# Patient Record
Sex: Male | Born: 1950 | Race: Black or African American | Hispanic: No | Smoking: Former smoker
Health system: Southern US, Community
[De-identification: ages and names within clinical notes are randomized; demographics above are authoritative.]

## PROBLEM LIST (undated history)

## (undated) DIAGNOSIS — I712 Thoracic aortic aneurysm, without rupture: Secondary | ICD-10-CM

## (undated) DIAGNOSIS — T7840XA Allergy, unspecified, initial encounter: Secondary | ICD-10-CM

## (undated) DIAGNOSIS — B1920 Unspecified viral hepatitis C without hepatic coma: Secondary | ICD-10-CM

## (undated) DIAGNOSIS — Z8249 Family history of ischemic heart disease and other diseases of the circulatory system: Secondary | ICD-10-CM

## (undated) DIAGNOSIS — E291 Testicular hypofunction: Secondary | ICD-10-CM

## (undated) DIAGNOSIS — Z8669 Personal history of other diseases of the nervous system and sense organs: Secondary | ICD-10-CM

## (undated) DIAGNOSIS — K635 Polyp of colon: Secondary | ICD-10-CM

## (undated) DIAGNOSIS — I7121 Aneurysm of the ascending aorta, without rupture: Secondary | ICD-10-CM

## (undated) DIAGNOSIS — N400 Enlarged prostate without lower urinary tract symptoms: Secondary | ICD-10-CM

## (undated) DIAGNOSIS — I10 Essential (primary) hypertension: Secondary | ICD-10-CM

## (undated) DIAGNOSIS — I4891 Unspecified atrial fibrillation: Secondary | ICD-10-CM

## (undated) DIAGNOSIS — I059 Rheumatic mitral valve disease, unspecified: Secondary | ICD-10-CM

## (undated) HISTORY — PX: UPPER GASTROINTESTINAL ENDOSCOPY: SHX188

## (undated) HISTORY — PX: COLON SURGERY: SHX602

## (undated) HISTORY — PX: MITRAL VALVE REPLACEMENT: SHX147

## (undated) HISTORY — PX: COLONOSCOPY: SHX174

## (undated) HISTORY — DX: Allergy, unspecified, initial encounter: T78.40XA

---

## 1997-12-06 ENCOUNTER — Encounter: Admission: RE | Admit: 1997-12-06 | Discharge: 1997-12-06 | Payer: Self-pay | Admitting: *Deleted

## 2002-05-17 ENCOUNTER — Ambulatory Visit (HOSPITAL_COMMUNITY): Admission: RE | Admit: 2002-05-17 | Discharge: 2002-05-17 | Payer: Self-pay | Admitting: *Deleted

## 2002-05-17 ENCOUNTER — Encounter (INDEPENDENT_AMBULATORY_CARE_PROVIDER_SITE_OTHER): Payer: Self-pay | Admitting: Specialist

## 2002-05-26 ENCOUNTER — Inpatient Hospital Stay (HOSPITAL_COMMUNITY): Admission: EM | Admit: 2002-05-26 | Discharge: 2002-05-28 | Payer: Self-pay | Admitting: Emergency Medicine

## 2003-04-18 ENCOUNTER — Ambulatory Visit (HOSPITAL_COMMUNITY): Admission: RE | Admit: 2003-04-18 | Discharge: 2003-04-18 | Payer: Self-pay | Admitting: Interventional Cardiology

## 2003-10-09 ENCOUNTER — Ambulatory Visit (HOSPITAL_COMMUNITY): Admission: RE | Admit: 2003-10-09 | Discharge: 2003-10-09 | Payer: Self-pay | Admitting: Interventional Cardiology

## 2004-05-19 ENCOUNTER — Encounter: Admission: RE | Admit: 2004-05-19 | Discharge: 2004-05-19 | Payer: Self-pay | Admitting: Surgery

## 2004-12-10 ENCOUNTER — Observation Stay (HOSPITAL_COMMUNITY): Admission: AD | Admit: 2004-12-10 | Discharge: 2004-12-11 | Payer: Self-pay | Admitting: Internal Medicine

## 2004-12-11 ENCOUNTER — Ambulatory Visit: Payer: Self-pay | Admitting: Oncology

## 2004-12-21 ENCOUNTER — Ambulatory Visit (HOSPITAL_COMMUNITY): Admission: RE | Admit: 2004-12-21 | Discharge: 2004-12-21 | Payer: Self-pay | Admitting: Interventional Cardiology

## 2004-12-21 ENCOUNTER — Encounter (INDEPENDENT_AMBULATORY_CARE_PROVIDER_SITE_OTHER): Payer: Self-pay | Admitting: Interventional Cardiology

## 2005-01-01 ENCOUNTER — Ambulatory Visit (HOSPITAL_COMMUNITY): Admission: RE | Admit: 2005-01-01 | Discharge: 2005-01-01 | Payer: Self-pay | Admitting: Interventional Cardiology

## 2005-01-06 ENCOUNTER — Emergency Department (HOSPITAL_COMMUNITY): Admission: EM | Admit: 2005-01-06 | Discharge: 2005-01-06 | Payer: Self-pay | Admitting: Emergency Medicine

## 2005-01-22 ENCOUNTER — Inpatient Hospital Stay (HOSPITAL_COMMUNITY): Admission: EM | Admit: 2005-01-22 | Discharge: 2005-02-04 | Payer: Self-pay | Admitting: Emergency Medicine

## 2005-12-10 ENCOUNTER — Encounter: Admission: RE | Admit: 2005-12-10 | Discharge: 2005-12-10 | Payer: Self-pay | Admitting: Surgery

## 2008-01-05 ENCOUNTER — Encounter: Admission: RE | Admit: 2008-01-05 | Discharge: 2008-01-05 | Payer: Self-pay | Admitting: Surgery

## 2008-02-18 ENCOUNTER — Emergency Department (HOSPITAL_COMMUNITY): Admission: EM | Admit: 2008-02-18 | Discharge: 2008-02-18 | Payer: Self-pay | Admitting: Emergency Medicine

## 2009-09-08 ENCOUNTER — Ambulatory Visit (HOSPITAL_COMMUNITY): Admission: RE | Admit: 2009-09-08 | Discharge: 2009-09-08 | Payer: Self-pay | Admitting: Family Medicine

## 2010-01-20 ENCOUNTER — Ambulatory Visit: Payer: Self-pay | Admitting: Surgery

## 2010-01-20 ENCOUNTER — Encounter: Admission: RE | Admit: 2010-01-20 | Discharge: 2010-01-20 | Payer: Self-pay | Admitting: Surgery

## 2010-04-26 ENCOUNTER — Encounter: Payer: Self-pay | Admitting: Surgery

## 2010-05-06 ENCOUNTER — Ambulatory Visit (HOSPITAL_BASED_OUTPATIENT_CLINIC_OR_DEPARTMENT_OTHER)
Admission: RE | Admit: 2010-05-06 | Discharge: 2010-05-06 | Disposition: A | Discharge status: Home / Self Care | Payer: 59 | Attending: Urology | Admitting: Urology

## 2010-05-06 ENCOUNTER — Other Ambulatory Visit: Payer: Self-pay | Admitting: Urology

## 2010-05-06 DIAGNOSIS — Z7901 Long term (current) use of anticoagulants: Secondary | ICD-10-CM | POA: Insufficient documentation

## 2010-05-06 DIAGNOSIS — Z954 Presence of other heart-valve replacement: Secondary | ICD-10-CM | POA: Insufficient documentation

## 2010-05-06 DIAGNOSIS — R310 Gross hematuria: Secondary | ICD-10-CM | POA: Insufficient documentation

## 2010-05-06 DIAGNOSIS — F17200 Nicotine dependence, unspecified, uncomplicated: Secondary | ICD-10-CM | POA: Insufficient documentation

## 2010-05-06 DIAGNOSIS — K219 Gastro-esophageal reflux disease without esophagitis: Secondary | ICD-10-CM | POA: Insufficient documentation

## 2010-05-06 LAB — POCT I-STAT, CHEM 8
BUN: 17 mg/dL (ref 6–23)
Calcium, Ion: 1.21 mmol/L (ref 1.12–1.32)
Chloride: 109 mEq/L (ref 96–112)
Creatinine, Ser: 1.6 mg/dL — ABNORMAL HIGH (ref 0.4–1.5)
Glucose, Bld: 83 mg/dL (ref 70–99)
HCT: 31 % — ABNORMAL LOW (ref 39.0–52.0)
Hemoglobin: 10.5 g/dL — ABNORMAL LOW (ref 13.0–17.0)
Potassium: 3.9 mEq/L (ref 3.5–5.1)
Sodium: 144 mEq/L (ref 135–145)
TCO2: 26 mmol/L (ref 0–100)

## 2010-05-06 NOTE — Op Note (Signed)
NAMEJAMS, JUCKETT NO.:  [id number]  MEDICAL RECORD NO.:  [id number]          PATIENT TYPE:  AMB  LOCATION:  [facility]                         FACILITY:  [facility]  PHYSICIAN:  Lillette Boxer. Maritta Kief, M.D.DATE OF BIRTH:  07-18-1950  DATE OF PROCEDURE:  05/06/2010 DATE OF DISCHARGE:                              OPERATIVE REPORT   PREOPERATIVE DIAGNOSIS:  Gross hematuria.  POSTOPERATIVE DIAGNOSIS:  Gross hematuria.  SURGICAL PROCEDURES: 1. Cystoscopy. 2. Bilateral retrograde ureteral pyelograms. 3. Bilateral renal washings. 4. Right ureteroscopy.  SURGEON:  Lillette Boxer. Keshonna Valvo, M.D.  ANESTHESIA:  General with LMA.  COMPLICATIONS:  None.  SPECIMENS:  Renal washings bilaterally for cytology.  BRIEF HISTORY:  Mr. [name] as a nice 60 year old gentleman who I have been following for gross hematuria for some time.  The patient has had a normal CT scan.  He has had been negative cytologies and negative cystoscopy.  He still has persistent gross hematuria.  I have been unable to localize this to the right or left side.  At this point, due to persistent gross hematuria, he presents at this time for further diagnostic studies including cystoscopy, bilateral retrogrades, bilateral renal washings and possible ureteroscopy.  Risks and complications of the procedure have been discussed with the patient.  He understands these.  He desires to proceed.  DESCRIPTION OF PROCEDURE:  The patient was identified in the holding area and received preoperative IV Cipro.  He was taken to the operating room where general anesthetic was administered using LMA.  He was placed in the dorsal lithotomy position.  Genitalia and perineum were prepped and draped.  Time-out was then called.  The procedure then commenced.  A 22-French panendoscope was passed through his urethra.  Urethra was normal, prostate was nonobstructive. The bladder was entered and inspected circumferentially.  There  were no tumors, trabeculations or foreign bodies.  There was little bit of bleeding that was brought up by passing the scope through the prostatic urethra.  However, this was not felt to have been the cause of the patient's hematuria.  After visualization of both ureteral orifices, eventually I saw blood coming through the right ureteral orifice.  I then performed a retrograde ureteral pyelogram using a 6-French open- ended catheter.  This showed a normal ureter.  The pyelocaliceal systems were normal without evidence of pyelocaliectasis or filling defects.  I then advanced the open-ended catheter over the guidewire into the right renal pelvis and performed gentle washing with saline.  The washings were sent for cytology.  Washings did have some blood within it.  At this point, I then passed a guidewire up the open-ended catheter, removed the cystoscope and passed the inner core of 55 cm ureteral access sheath for ureteral dilatation.  The ureter was easily dilated. I then left the guidewire in place and then passed a flexible ureteroscope over top of the guidewire through the ureter and up into the right renal pelvis.  I then performed a systematic survey of the renal pelvis and the pyelocaliceal systems.  Several of the small calyces had punctate little telangiectatic vessels that looked like that had been bleeding.  I saw no specific lesions except for one small little 1 mm area that looked like it could be a small polyp.  This was in the lower pole calix.  There were no papillary lesions within the caliceal system or the within the renal pelvis.  I saw no stones.  There seem to be the telangiectatic vessels in 2 or 3 of the calyces, not specific to one.  These appeared to be more on the lower pole caliceal system than the upper pole.  I then removed the scope and visualized the ureter as the scope was withdrawn through the ureter.  No lesions were seen.  I then replaced the cystoscope  and performed a retrograde on the left side.  This revealed a normal ureter without any evidence of filling defects or widening.  The pyelocaliceal system was normal, without evidence of filling defects.  I then advanced the open-ended catheter up into the renal pelvis.  I then performed gentle washings with saline.  There was no blood in the washings from this side.  These were sent for cytology as well.  The catheter was then removed and the bladder drained and the cystoscope removed.  The patient tolerated the procedure well.  He was awakened and then taken to PACU in stable condition.  He will be discharged on 3 days of nitrofurantoin and Uribel.  I will follow him up on the May 22, 2010, at 2:30.     Lillette Boxer. Aven Christen, M.D.     SMD/MEDQ  D:  05/06/2010  T:  05/06/2010  Job:  [id number]  cc:   Belva Crome, M.D. Fax: Bur[place]on. Little, M.D. [phone]  Electronically Signed by Franchot Gallo M.D. on 05/06/2010 06:05:18 PM

## 2010-06-16 ENCOUNTER — Ambulatory Visit (HOSPITAL_COMMUNITY)
Admission: RE | Admit: 2010-06-16 | Discharge: 2010-06-16 | Disposition: A | Discharge status: Home / Self Care | Payer: 59 | Source: Ambulatory Visit | Attending: Cardiology | Admitting: Cardiology

## 2010-06-16 DIAGNOSIS — I079 Rheumatic tricuspid valve disease, unspecified: Secondary | ICD-10-CM | POA: Insufficient documentation

## 2010-06-16 DIAGNOSIS — I059 Rheumatic mitral valve disease, unspecified: Secondary | ICD-10-CM | POA: Insufficient documentation

## 2010-06-16 DIAGNOSIS — I359 Nonrheumatic aortic valve disorder, unspecified: Secondary | ICD-10-CM | POA: Insufficient documentation

## 2010-06-22 LAB — CROSSMATCH
ABO/RH(D): O POS
Antibody Screen: NEGATIVE

## 2010-06-22 LAB — ABO/RH: ABO/RH(D): O POS

## 2010-06-23 ENCOUNTER — Other Ambulatory Visit: Payer: Self-pay | Admitting: Surgery

## 2010-06-23 ENCOUNTER — Encounter (INDEPENDENT_AMBULATORY_CARE_PROVIDER_SITE_OTHER): Payer: 59 | Admitting: Surgery

## 2010-06-23 DIAGNOSIS — I059 Rheumatic mitral valve disease, unspecified: Secondary | ICD-10-CM

## 2010-06-23 DIAGNOSIS — I712 Thoracic aortic aneurysm, without rupture: Secondary | ICD-10-CM

## 2010-06-24 NOTE — Consult Note (Signed)
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  CLINICAL HISTORY:  I was asked by Dr. Tamala Julian to evaluate the patient for consideration of surgical treatment of the above problem.  He is a 60- year-old gentleman who is well-known to me from previous surgery.  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  This subsequently failed and was replaced with a St. Jude mechanical valve by Dr. Linus Mako in 1988.  The patient was referred to me in October 2006, after he developed fatigue and dark urine as well as anemia.  His workup was consistent with a hemolytic anemia and a TEE showed a periprosthetic leak adjacent to the sewing ring with moderate to severe mitral regurgitation through the periprosthetic leak.  The valve leaflets appeared to be doing well.  I operate on him on 01/28/2005, through a right thoracotomy approach with cardiopulmonary bypass via the right common femoral vein and right common femoral artery.  This was performed under hypothermic fibrillatory arrest.  The posterior periprosthetic leak was repaired with pledgeted horizontal mattress sutures since the remainder of the valve appeared to be well seated.  He did well with that surgery.  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  The maximum diameter has been about 5-cm at the level of the sinuses of Valsalva with the ascending aorta tapering down to about 3.5-cm in the proximal arch and 2.9-cm in the distal arch with the descending aorta about 2.7-cm.  When I last saw him on January 20, 2010, he told me that he had developed some weakness and anemia in June 2011, and underwent a GI  workup did not show any sign of blood loss.  He was started on iron with improvement in his blood count. He was seen several times by Dr. Franchot Gallo from Urology for workup of his dark urine, but no cause of hematuria could be found.  He says his urine usually starts out more clear in the morning and then as the day goes on, he gets darker.  At that time in October, he was feeling well without any chest pain or shortness of breath.  More recently, he presented back to Dr. Tamala Julian with about 6-week history of progressive dyspnea on exertion and lower extremity swelling.  His laboratory workup was consistent with homolysis with a low serum haptoglobin of less than 10 as well as a marked increase in his reticulocyte count of 8.5 and elevated total bilirubin of 2.3 with a indirect bilirubin of 1.8 and elevated direct bilirubin of 0.49.  He subsequently underwent a 2-D echocardiogram followed by a transesophageal echocardiogram which showed severe mitral regurgitation which was felt to be coming from a perivalvular leak.  His left atrium was severely dilated and the appendix was well visualized.  There is no PFO or atrial septal defect.  The aortic valve was trileaflet with mildly calcified leaflets and mild regurgitation.  Ejection fraction was 45%.  There is moderate tricuspid regurgitation.  Right ventricular size and function appeared normal.  REVIEW OF  SYSTEMS:  GENERAL:  He denies any fever or chills.  He does report significant fatigue over the past 1-2 months. EYES:  He did have some double vision and dizziness about 1 year ago and underwent a MRI which was reportedly negative.  He has had no recurrence. ENT:  Negative. ENDOCRINE:  He denies diabetes and hypothyroidism. CARDIOVASCULAR:  He denies any chest pain.  He has had shortness of breath with mild exertion worse over the past 6 weeks.  He denies PND and orthopnea.  He has had peripheral edema.  He denies  palpitations. RESPIRATORY:  He denies cough and sputum production. GI:  He has had no nausea or vomiting.  He denies melena and bright red blood per rectum. GU:  He does report dark urine.  He has been followed by Dr. Franchot Gallo.  He does report some hematuria.  MUSCULOSKELETAL:  He denies arthralgias and myalgias. NEUROLOGICAL:  He denies any focal weakness or numbness.  He denies dizziness and syncope.  He has never had a TIA or stroke. PSYCHIATRIC:  Negative. HEMATOLOGICAL:  He has a history of hemolytic anemia related to periprosthetic leak in the past.  MEDICATIONS: 1. Coumadin 5 mg daily. 2. Levitra p.r.n. 3. Iron 325 mg t.i.d. 4. Multivitamin daily. 5. Prilosec OTC 20 mg daily. 6. Trazodone 50 mg nightly p.r.n. 7. Benicar/HCT 20/12.5 daily.  ALLERGIES:  To penicillin.  PAST MEDICAL HISTORY:  Significant for rheumatic heart disease as mentioned above, status post mitral valve replacement as mentioned above with a porcine bioprosthesis in 1973 and redo mitral valve replacement with a St. Jude mechanical prosthesis in 1988.  He subsequently had repair of a large periprosthetic leak with hemolytic anemia and congestive heart failure in 2006.  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  History of atrial fibrillation and atrial flutter.  He has a history of BPH, followed by Dr. Diona Fanti.  He has a history of hemolytic anemia with hematuria.  He has a history of adenomatous colon polyp followed by Dr. Wilford Corner.  He has a history of erectile dysfunction.  SOCIAL HISTORY:  He is married and lives with his wife.  He has 2 children.  He has smoked occasionally in the past, but not presently. He denies alcohol and drug use.  He currently works for [employer] as an Chief Financial Officer.  FAMILY HISTORY:  Negative.  PHYSICAL EXAMINATION:  Vital Signs:  Blood pressure is 115/70, pulse 62 and regular, respiratory rate is 14 and unlabored.   Oxygen saturation on room air is 95%.  He is a thin [place] male, in no distress who does not look as robust as he did last time I saw him in October 2011. HEENT:  Normocephalic and atraumatic.  Pupils are equal and reactive to light and accommodation.  Extraocular muscles are intact.  His sclerae are slightly icteric.  Oropharynx is clear.  Neck:  Normal carotid pulses bilaterally.  There are no bruits.  There is no adenopathy or thyromegaly.  There is no JVD.  Cardiac:  Regular rate and rhythm with crisp mechanical valve click.  There is a 1/6 systolic murmur along the left sternal border that is most noticeable with inspiration.  There is no diastolic murmur.  Lungs:  Clear.  There is a old sternotomy scar. There is a right thoracotomy scar.  Abdomen:  Active bowel sounds.  His abdomen is soft and nontender.  There are no palpable masses or organomegaly.  Extremities:  Mild bilateral lower extremity edema to the knee.  Pedal pulses are palpable bilaterally.  Skin:  Warm and dry. Neurologic:  Alert and oriented x3.  Motor and sensory exams are grossly normal.  IMPRESSION:  The patient has severe mitral regurgitation with hemolytic anemia and congestive heart failure.  I think this is probably periprosthetic and posterior in the same genital area where I repaired his previous periprosthetic leak.  They may also be some regurgitation through the valve itself as possible that one of the leaflets is not closing completely due to pannus around the valve.  I think this will require redo mitral valve replacement.  He also has some degree of tricuspid regurgitation noted on TEE that was quantified has mild-to- moderate by Cardiology.  This would need be looked at closely because it may require tricuspid annuloplasty to prevent severe right heart failure symptoms.  He will require cardiac catheterization prior to surgical treatment to reassess his coronaries.  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  We will obtain a CT angiogram of the chest, abdomen and pelvis to evaluate this as well as the remainder of his aorta to date and decisions about cannulation.  My bias would be to leave his aortic root aneurysm alone at this time.  It has remained stable in size since he has trivial aortic insufficiency and repair of this problem, I would greatly complicate his redo mitral valve replacement and increase his operative morbidity and mortality greatly. I will need to think about the approach more thoroughly, and I will discuss his case with my partners to get their opinion.  I discussed all this with the patient and his wife in the office and answered all their questions.  I discussed the high-risk nature of this surgery and told them that we would discuss it further once we obtain a CT scan.  He is on Coumadin with a mechanical mitral valve and therefore in order to have cardiac catheterization, we will have to admit him for discontinuation of Coumadin and heparin bridge prior to catheterization and follow surgery shortly, thereafter while he is hospitalized on heparin.  Gilford Raid, M.D. Electronically Signed  BB/MEDQ  D:  06/23/2010  T:  06/24/2010  Job:  [id number]  cc:   Belva Crome, M.D. Priscille Heidelberg Little, M.D.

## 2010-06-30 ENCOUNTER — Encounter: Payer: 59 | Admitting: Surgery

## 2010-06-30 ENCOUNTER — Other Ambulatory Visit: Payer: 59

## 2010-07-03 ENCOUNTER — Other Ambulatory Visit: Payer: 59

## 2010-07-07 ENCOUNTER — Ambulatory Visit: Payer: 59 | Admitting: Surgery

## 2010-08-07 ENCOUNTER — Ambulatory Visit
Admission: RE | Admit: 2010-08-07 | Discharge: 2010-08-07 | Disposition: A | Discharge status: Home / Self Care | Payer: 59 | Source: Ambulatory Visit | Attending: Surgery | Admitting: Surgery

## 2010-08-07 DIAGNOSIS — I712 Thoracic aortic aneurysm, without rupture: Secondary | ICD-10-CM

## 2010-08-07 MED ORDER — IOHEXOL 300 MG/ML  SOLN
75.0000 mL | Freq: Once | INTRAMUSCULAR | Status: AC | PRN
  Administered 2010-08-07: 75 mL via INTRAVENOUS

## 2010-08-18 NOTE — Assessment & Plan Note (Signed)
OFFICE VISIT   [name], [name]  DOB:  10-Sep-1950                                        January 09, 2008  CHART #:  [id number]   The patient was supposed to return to see me today for followup of an  aortic root aneurysm.  He had an MR angiogram to follow this up, but my  office was cancelled today due to an emergency.  His previous history is  documented in the office chart.  I last saw him on December 14, 2005,  for followup of his aortic root aneurysm in a maximum diameter at that  time measured about 5 cm at the level of sinuses of Valsalva.  We  decided to follow this up in 2 years with an MR angiogram, which he had  done on January 05, 2008.  I reviewed the MR angiogram and is unchanged  from his prior study of 2007 with a maximum transverse diameter at the  level of the sinus of Valsalva 5 cm.  The distal ascending aorta taper  down to about 3.5 cm proximal to the arch and about 3 cm at the level of  the proximal arch.  The distal arch was 2.9 cm and the descending  thoracic aorta was 2.7 cm above the diaphragm.  There is no evidence of  dissection or fluid around the aorta.   I reviewed the results of the MR angiogram with the patient by telephone  at his request.  I told him I do not think there is any indication to do  surgery at this time, but then we should do a repeat MR angiogram in  about 2 years to follow up on this.  He has ongoing Cardiology follow up  with Dr. Belva Crome III for his previously placed mitral valve.  The  patient has no complaints at this time, said he was feeling well and  doing well overall.  He will continue to follow up with Dr. Tamala Julian and we  will arrange for him to have a repeat MR angiogram of the thoracic aorta  in 2 years.   Gilford Raid, M.D.  Electronically Signed   BB/MEDQ  D:  01/09/2008  T:  01/10/2008  Job:  [id number]   cc:   Belva Crome, M.D.

## 2010-08-18 NOTE — Assessment & Plan Note (Signed)
OFFICE VISIT   [name], [name]  DOB:  1950/09/15                                        January 20, 2010  CHART #:  [id number]   HISTORY:  The patient returns to my office today for followup of an  aortic root aneurysm.  I last saw him on January 09, 2008, at which time,  an MR angiogram showed that the maximum diameter of his aorta at the  level of his sinuses of Valsalva was 5 cm.  The distal ascending aorta  tapered down to about 3.5 cm proximal to the arch and about 3 cm at the  level of the proximal arch.  The distal arch was 2.9 cm and descending  thoracic aorta was 2.7 cm above the diaphragm.  He was doing well  otherwise and we elected to repeat his study in about 2 years.  He  reports that in the first part of this year, he developed weakness and  was found to be anemic.  He underwent blood transfusion.  He said that  he underwent a GI workup that did not show any blood loss, although he  denies having EGD or colonoscopy.  He said that he was started on iron  and has continued to improve his blood count.  He reports that in around  June of this year, he developed dark urine.  He was seen several times  by Dr. Franchot Gallo from Urology for workup.  He said that no cause  of hematuria could be found.  He said that he still has dark urine which  usually is worse in the evening.  He said his urine starts out more  clear in the morning and then as the day goes on, it gets darker.  He  does not think a hemolytic workup has been performed.  He denies any  chest pain, cough, or sputum production.  He has had no shortness of  breath.   PHYSICAL EXAMINATION:  His blood pressure is 127/74 and his pulse is 57  and regular.  Respiratory rate is 18 and unlabored.  Oxygen saturation  on room air is 99%.  He looks well.  Cardiac exam shows regular rate and  rhythm with crisp mechanical valve click.  There is no murmur, rub, or  gallop.  His lungs are clear.   There is no peripheral edema.   DIAGNOSTIC TESTS:  He had an MR angiogram of the chest done today at  [place].  The timing of the contrast bolus was suboptimal, so  the contrast was mostly in the right side of the heart during the scan,  but it is possible to see his aorta to measure the diameter.  The  diameter of the aortic root at the sinus level appears about the same as  before of 5 cm.  The ascending aorta tapers down as it did before.   IMPRESSION:  The patient has a stable 5-cm aortic root and ascending  aortic aneurysm, which is unchanged in size since 2006.  I would  recommend repeating his MR angiogram again in 2 years to follow up on  this.  He has no sign of aortic insufficiency on exam and has been  followed by Dr. Daneen Schick.  He is having dark urine again which is  somewhat concerning since this is  how he initially presented back in  2006, when he was found to have a perivalvular leak around a prosthetic  mitral valve, which has subsequently been repaired.  I do not hear a  murmur of mitral insufficiency, but it is possible he could have a small  periprosthetic leak that is not causing a murmur.  I will discuss this  with Dr. Tamala Julian and I think he probably should have a repeat  echocardiogram to rule this out if it has not been performed since he  started developing dark urine again.  The patient really is not very  clear about his workup that has been done since the beginning of this  year.   Gilford Raid, M.D.  Electronically Signed   BB/MEDQ  D:  01/20/2010  T:  01/21/2010  Job:  [id number]   cc:   Belva Crome, M.D.  Lillette Boxer. Dahlstedt, M.D.

## 2010-08-21 NOTE — H&P (Signed)
NAMEDHRUVA, FEARNLEY               ACCOUNT NO.:  [id number]   MEDICAL RECORD NO.:  [id number]          PATIENT TYPE:  INP   LOCATION:  5151                         FACILITY:  [facility]   PHYSICIAN:  Corinna L. Conley Canal, MDDATE OF BIRTH:  04/30/1950   DATE OF ADMISSION:  12/10/2004  DATE OF DISCHARGE:                                HISTORY & PHYSICAL   CHIEF COMPLAINT:  Anemia.   HISTORY OF PRESENT ILLNESS:  Mr. Menner is a 60 year old white male  patient of Dr. Maylon Peppers who was directly admitted today with symptomatic  anemia.  Patient denies any melena or hematochezia but he has been having  gross hematuria on and off which has been evaluated as an outpatient by Dr.  Diona Fanti.  Reportedly he had a CT of the abdomen and pelvis which was  unremarkable.  He reportedly had urine cytology and cystoscopy which were  reportedly normal.  Patient's hemoglobin on July 6 was 13 and has been about  9 most recently.  He has had about 9 Hemoccult cards brought to the office  which have all been negative.  Patient reports that he is more dyspneic on  exertion.  He denies any chest pain, any wheezing.  He has lost 10 pounds  over the past two weeks.  His appetite has been poor.  Dr. Maylon Peppers  discussed the case with Dr. Diona Fanti who felt that a drop in hemoglobin of  this level would probably not be explained by the hematuria and that  something else may be going on as well.  Dr. Maylon Peppers discussed the case  with Dr. Alen Blew of hematology who recommended admission for work-up and  possible transfusion.  Dr. Alen Blew reported willingness to consult in the  hospital.   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   MEDICATIONS:  1.  Coumadin 5 mg a day.  2.  Proscar 5 mg a day.  3.  Iron.   SOCIAL HISTORY:  Patient does smoke.  He does not drink or use drugs.  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  He is here with his wife.   FAMILY HISTORY:   His mother died of stomach cancer.   REVIEW OF SYSTEMS:  As above, otherwise negative.   PHYSICAL EXAMINATION:  VITAL SIGNS:  Temperature 98.8, pulse 74, respiratory  rate 18, blood pressure 118/68, 98% saturation on room air.  GENERAL:  Patient is a thin black male who appears younger than his stated  age.  HEENT:  Slightly pale conjunctivae.  Sclerae non-icteric.  Normocephalic,  atraumatic.  Pupils are equal, round, and reactive to light.  Moist mucous  membranes.  NECK:  Supple.  No lymphadenopathy.  LUNGS:  Clear to auscultation bilaterally without wheezes, rhonchi, or  rales.  CARDIOVASCULAR:  Regular rate and rhythm with mechanical click and systolic  murmur.  ABDOMEN:  Soft, nontender, nondistended.  RECTAL:  Deferred as multiple Hemoccults in the office have been negative.  EXTREMITIES:  No clubbing, cyanosis, edema.  SKIN:  No rash.  PSYCHIATRIC:  Normal affect.  NEUROLOGIC:  Alert and oriented.  Cranial nerves and sensory motor  examination are intact.   LABORATORIES:  Creatinine on September 6 in the office was 1.5.  Total  bilirubin was 1.6, AST 130.  Otherwise, his complete metabolic panel was  normal.  His hemoglobin was 9.2, hematocrit 26, MCV was 98, platelet count  was 135.  His B12 was 504.  His ferritin was 107, iron was 100, TIBC was  266, transferrin was 190.  His hemoglobin was 13.5 on October 31, 2004.  Laboratory work today:  ABG is normal on room air.  White blood cell count  4.6, hemoglobin 8.4, hematocrit 24.1, MCV is 97, RDW is 16.9.  His  differential is normal except for a slightly elevated eosinophil percentage  at 7.  Reticulocyte count is high at 6.6.  Absolute reticulocyte count is  162.  INR is 2.5, PTT is 36.  Complete metabolic panel is significant for a  total bilirubin of 1.8, AST of 167, ALT of 68, otherwise unremarkable.  His  LDH is elevated at 1905.  B-type natriuretic peptide is 165.   ASSESSMENT/PLAN:  1.  Normocytic anemia:  Repeat B12  is pending.  Folate is pending.      Haptoglobin is pending as are iron indices.  His anemia can be somewhat      explained by his hematuria but his LDH is elevated which may be      indicative of hemolysis.  2.  Hematuria work-up has been negative.  I have no records from Dr.      Diona Fanti and it may be helpful to get the CAT scan report as well as op      note from him in the morning.  I will check a urinalysis at this time.  3.  Mitral valve replacement on chronic Coumadin.   Patient will get transfused 2 units of packed red blood cells as he is  symptomatic with this hemoglobin level.  Consider consulting Dr. Alen Blew in  the morning.  Patient, however, at this time is being placed on 23-hour  observation.      Corinna L. Conley Canal, MD  Electronically Signed     CLS/MEDQ  D:  12/10/2004  T:  12/11/2004  Job:  [id number]   cc:   Candace Gallus, M.D.  Fax: [phone]   Lillette Boxer. Dahlstedt, M.D.  Fax: [phone]   Mathis Dad. [employer]

## 2010-08-21 NOTE — H&P (Signed)
NAME:  [name], [name] NO.:  [id number]   MEDICAL RECORD NO.:  [id number]                   PATIENT TYPE:  INP   LOCATION:  1823                                 FACILITY:  [facility]   PHYSICIAN:  Raelyn Ensign. Vladimir Faster, M.D.            DATE OF BIRTH:  December 03, 1950   DATE OF ADMISSION:  05/26/2002  DATE OF DISCHARGE:                                HISTORY & PHYSICAL   HISTORY OF PRESENT ILLNESS:  The patient is a 60 year old male originally  referred to me by Dr. Maylon Peppers for consideration of colon cancer screening.  He denied any family history of colorectal neoplasia but is turning 3.  He  has a St. Jude mitral valve and is on chronic Coumadin therapy.  This was  held for a colonoscopy that was performed on February 12th.  At colonoscopy,  two small adenomatous polyps were removed that apparently fragmented as they  were retrieved since the pathology report mentions five small pieces of  polyp; however, only two were removed colonoscopically one at 50 cm and one  at 40 cm.  The patient tolerated the procedure well and was well for the  next eight days.  His Coumadin was restarted.  He had his prothrombin time  checked this week and said it was fine.  He called me yesterday and said he  had had two reddish bowel movements.  He had no abdominal pain and was not  feeling lightheaded or dizzy or weak.  However, today he had six additional  bloody stools and began to feel somewhat weak.  He came to the emergency  room where his hemoglobin was checked and was 10.2.  His platelet count was  127.  Prothrombin time 30.5 for an INR of 3.7.  He is admitted for  observation, transfusion if necessary, and slow correction of his  prothrombin time as needed.  I discussed the case on the phone with Dr.  Peter Martinique and we elected to give the patient 2.5 mg of vitamin K orally.  He has no abdominal pain.   CURRENT MEDICATIONS:  1. Coumadin 5 mg daily.  2. Lanoxin 0.5 mg  daily.   ALLERGIES:  PENICILLIN.   PAST MEDICAL HISTORY:  Pertinent for a mitral valve replacement in 1988.   FAMILY HISTORY:  Negative for ulcers, gallstones, inflammatory bowel disease  or colorectal neoplasia.   SOCIAL HISTORY:  He is married.  Does not smoke cigarettes or drink alcohol.  He has an occasional cigar.   REVIEW OF SYSTEMS:  GENERAL:  No weight loss or night sweats.  ENDOCRINE:  No history of diabetes or thyroid problems.  SKIN:  No rash or pruritus.  EYES:  No icterus or change in vision.  ENT:  No aphthous ulcers or chronic  sore throat.  RESPIRATORY:  No shortness of breath, cough, or wheezing.  CARDIAC:  Palpitations and history of mitral valve replacement.  GI:  As  above.  GU:  No dysuria or hematuria.  Remainder of review of systems is  negative.   PHYSICAL EXAMINATION:  GENERAL:  He is a well-developed, well-nourished  adult male in no acute distress.  VITAL SIGNS:  Afebrile, blood pressure 110/70, pulse 67 and regular.  SKIN:  Normal.  HEENT:  Eyes are anicteric, conjunctivae are pink.  Oropharynx is  unremarkable without aphthous ulcerations.  NECK:  Supple without cervical or inguinal adenopathy.  CHEST:  Clear to auscultation and percussion.  HEART:  Has clicking valve sounds and midline sternotomy scar is seen.  ABDOMEN:  Benign without mass, tenderness, organomegaly.  There is no  rebound or hernia.  Bowel sounds are normal.  RECTAL:  Not performed.  EXTREMITIES:  Without clubbing, cyanosis, edema, or rash.   IMPRESSION:  Postpolypectomy bleed in a 60 year old male on Coumadin.  He is  currently over anticoagulated for unclear reasons.   PLAN:  Prothrombin time will be checked tomorrow.  He will be observed in  hospital with normal saline IV running at 75 mL per hour.  Hemoglobin will  be checked every 6 hours and if he drops below 9 we are going to give him a  unit of blood.  If he persists in bleeding, I will repeat his sigmoidoscopy  and  either cauterize or inject the bleeding sites for control of hemorrhage.  Please see the orders.                                               Raelyn Ensign. Vladimir Faster, M.D.    PJS/MEDQ  D:  05/26/2002  T:  05/26/2002  Job:  [id number]   cc:   Sinclair Grooms, M.D.  [place]. [employer]  [address] [place] [address]  Fax: [phone]   Candace Gallus, M.D.  [place]. [place] [address]  Fax: [phone]

## 2010-08-21 NOTE — Consult Note (Signed)
[name], SICOTTE NO.:  [id number]   MEDICAL RECORD NO.:  [id number]          PATIENT TYPE:  INP   LOCATION:  5151                         FACILITY:  [facility]   PHYSICIAN:  Mathis Dad. Shadad        DATE OF BIRTH:  11-Jul-1950   DATE OF CONSULTATION:  DATE OF DISCHARGE:                                   CONSULTATION   CONSULTING PHYSICIAN:  Benito Mccreedy, M.D.   REASON FOR CONSULTATION:  Anemia.   HISTORY OF PRESENT ILLNESS:  This is a 60 year old African-American  gentleman with past medical history significant for St.  Jude's mitral valve  replacement in 1988 for rheumatic fever.  He has been chronically  anticoagulated under the care of cardiology, Dr. Tamala Julian.  He has been  complaining of hematuria for the last few months.  He was noted to be anemic  as well.  The patient had an extensive workup by Dr. Diona Fanti from urology.  His workup included a CT scan of the abdomen and pelvis which was  essentially unremarkable.  The patient has also had urine cytology and  cystoscopy which was essentially normal.  The patient's hemoglobin on October 08, 2004 was 13 and has been as low as 9 before.  On October 29, 2004, it was  13.5.  In August 2006, it was 9.1.  Most recently on December 08, 2004, his  hemoglobin is 9.2, his bilirubin is 1.6, his creatinine is 1.3.  He has also  had slight elevation in his LFTs including AST of 130, an ALT was normal at  62.  He had normal iron studies and B12 and folate on November 19, 2004.  Due  to symptomatic anemia, the patient was admitted for this workup.   The patient reports he does have gross hematuria and some exertional  dyspnea, but he does not report any cough, hemoptysis or hematemesis.  Did  not report any rectal bleeding.  Does not report any petechiae.  Did not  report any easy bruisability.  Did not report any headaches, blurred vision,  double vision or neurological changes.   PAST MEDICAL HISTORY:  As mentioned mitral  valve replacement in 1988.  He  has also had adenomatous polyps in 2004.  There is also history of erectile  dysfunction.   MEDICATIONS:  He is on Coumadin, Proscar and iron replacement.   FAMILY HISTORY:  His mother had gastric cancer.  No other blood disorders.   SOCIAL HISTORY:  He does occasional smoking.  He does not drink or do any  drugs.  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   REVIEW OF SYSTEMS:  He does not have any fever, chills, night sweats, weight  loss, appetite changes.  He did not have any chest pain, shortness of  breath, cough, hemoptysis, or hematemesis.  He does have external dyspnea  when his hemoglobin is low.  He does not have any nausea or vomiting.  He  does not have any neurological complaints.  He denies any bleeding or  clotting tendency.  He  does have hematuria as mentioned above.   PHYSICAL EXAMINATION:  An alert and oriented gentleman, appeared well.  He  was not in any physical distress.  His temperature is 98.  Pulse is 80.  Respirations 16.  His blood pressure is 118/60.  He is saturating 98% on  room air.  Head is normocephalic, atraumatic.  Pupils equal, round and  reactive to light.  Mucous membranes are moist and pink.  Neck is supple, no  lymphadenopathy.  Heart is regular rate and rhythm.  Lungs clear to  auscultation, no rales, wheezes, dullness to percussion.  Abdomen is soft,  nontender.  Extremities with no clubbing, cyanosis or edema.  Neurologically  intact.  Examination of his chest did show that he has an audible __________   Blood work on this admission showed a hemoglobin of 8.4, white cells of 4.6,  and platelet count of 165.  The patient did receive two units of transfusion  which bumped him up to hematocrit of 28.5.  His studies show that he had a  retic percentage of 6.6.  His INR is 2.5, potassium of 4.5.  His creatinine  is 1.5, bilirubin 1.8.  His AST is 167, ALT 68.  His LDH is 1905.  His iron   saturation is 34% and ferritin of 92.  His haptoglobin is less than 6.  His  urinalysis showed brown with large bilirubin, a lot of protein and large  amounts of blood, positive for nitrites and positive for leukocytes.  His  Coombs' test was positive by IgG and by complement.   Examination of his peripheral smear showed a few schistocytes, about 2 to 3  in high-power field.  There was no evidence of spherocytosis.  There was no  evidence of dysplastic-looking cells.   ASSESSMENT AND PLAN:  This is a 60 year old gentleman with worsening anemia  for the last two months.  His indices report a normocytic normochromic;  however, he had an elevated LDH, bilirubin, retic count and low haptoglobin  indicating an hemolytic process.  He has a negative Coombs' test which  indicates the possibility of a nonimmune hemolytic process with the  differential diagnosis including some medications but mostly mechanical  destruction.  The most likely culprit in his case would be the mitral valve.  A perivalvular leak is a pretty common cause for a nonimmune hemolytic  anemia, although aortic valves are most common to cause that than mitral  valves.   So my recommendation at this point would be to get an echocardiogram to  evaluate the valve as well as the perivalvular structures as well as  evaluate his aneurysmal dilatation with that.  Other causes of  microangiopathic hemolytic anemia such as TTP are less likely given the lack  of constellation of symptoms and the fact that his platelets are normal.  Have set up the patient to follow up with me in the next week or so to check  his CBC to see if he needs a transfusion.           ______________________________  Mathis Dad [facility]  Electronically Signed     FNS/MEDQ  D:  12/11/2004  T:  12/11/2004  Job:  [id number]   cc:   Candace Gallus, M.D.  [place]. [place] [address]  Fax: [phone]

## 2010-08-21 NOTE — Discharge Summary (Signed)
[name], [name] NO.:  [id number]   MEDICAL RECORD NO.:  [id number]          PATIENT TYPE:  INP   LOCATION:  2012                         FACILITY:  [facility]   PHYSICIAN:  [name], M.D.     DATE OF BIRTH:  1950-05-28   DATE OF ADMISSION:  01/22/2005  DATE OF DISCHARGE:  02/03/2005                                 DISCHARGE SUMMARY   PRIMARY ADMITTING DIAGNOSES:  1.  Hemolytic anemia.  2.  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   ADDITIONAL/DISCHARGE DIAGNOSES:  1.  Hemolytic anemia.  2.  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  3.  Mild postoperative renal insufficiency.  4.  Postoperative volume overloaded.  5.  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  6.  Benign prostatic hypertrophy.  7.  History of erectile dysfunction.  8.  History of adenomatous polyps.   PROCEDURES PERFORMED:  1.  Right thoracotomy.  2.  Repair of right periprosthetic mitral valve leak with cardiopulmonary      bypass via the right common femoral vein and right common femoral      artery.   HISTORY:  The patient is a 60 year old male who is well-known to [facility].  He  previously underwent a mitral valve replacement in the 1970 and subsequently  a redo mitral valve replacement with a St. Jude valve in 1988 by Dr. Victory [name].  He has been followed by Dr. 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  Over the past several  months, he has developed increasing fatigue, hematuria and anemia which was  consistent with hemolysis.  A transesophageal echocardiogram was performed  and showed a periprosthetic leak with moderate-to-severe mitral valve  regurgitation.  He recently was seen by his hematologist and was found to  have a hemoglobin of 6.5 and hematocrit of 8.1.  Because of these findings,  he was felt to require admission for transfusion and cardiology and  cardiothoracic surgery consultations for possible redo mitral valve surgery.  Please see dictated history and physical for further details.   HOSPITAL COURSE:  The patient was admitted on January 22, 2005 and was  transfused 2 units of packed red blood cells.  He was seen by Dr. Daneen [name] and was started on IV heparin and his Coumadin was discontinued in  preparation for cardiac catheterization.  This was performed on January 26, 2005 and he was found to have no significant coronary artery disease and no  aortic valve disease.  He was found to have good mitral valve function with  mitral regurgitation as previously noted.  He was seen by Dr. Gilford [name]  for consideration of surgery.  Dr. Cyndia [name] explained the risks, benefits and  alternatives of the procedure with the patient and he agreed to surgery.  He  remained stable and was taken to the operating room on January 28, 2005.  He  underwent a right thoracotomy with mitral valve repair.  His paravalvular  leak was repaired with pledgeted sutures.  Intraoperatively, a large area of  dehiscence was noted along the posterior annulus, which was repaired without  difficulty.  He did require cardiopulmonary bypass through the femoral vein  and femoral artery.  He tolerated the procedure well and was transferred to  the SICU in stable condition.  He was able to be extubated shortly after  surgery.  He was hemodynamically stable and doing well on postop day #1.  His chest tubes and hemodynamic monitoring devices were removed in the usual  fashion.  He was restarted on Coumadin and was able to be transferred to the  floor on postop day #1.  His anemia has been stable postoperatively and he  has been started on an iron supplement.  He has not required further  transfusions since his initial admission.  His main postoperative problem  has been significant volume overload.  He has been aggressively diuresed and  is making good progress.  He is  still above his preoperative weight and has  lower extremity and scrotal edema on physical exam, but this is improving.  His anticoagulation has been ongoing and at the present time, his PT is 20.6  with an INR of 1.8.  He is ambulating in the halls without difficulty.  He  has remained afebrile and all vital signs have been stable.  He also had  mild postoperative renal insufficiency with creatinine bumping up to 1.6.  This was also remained stable and has trended back down.  His most recent  labs show a hemoglobin of 8.6, hematocrit 25.5, white count 6.0, platelets  177,000; sodium 136, potassium 4.1, BUN 11, creatinine 1.5.  He is scheduled  to have a repeat CBC and BMET on the morning of February 03, 2005 in addition  to PT and INR to check his Coumadin.  It is anticipated that if he is  therapeutic with an INR greater than 2.0 and no other acute changes have  occurred, he will hopefully be ready for discharge home on February 03, 2005.   DISCHARGE MEDICATIONS:  1.  Coumadin -- home dose will be determined by PT and INR drawn on the date      of discharge.  2.  Lasix 40 mg daily x1 week.  3.  K-Dur 20 mEq daily x1 week.  4.  Toprol-XL 25 mg daily.  5.  Nu-Iron 150 mg daily.  6.  Proscar 5 mg daily.  7.  Tylox one to two q.4 h. p.r.n. for pain.   DISCHARGE INSTRUCTIONS:  He is asked to refrain from driving, heavy lifting  or strenuous activity.  He may continue ambulating daily and using his  incentive spirometer.  He may shower daily and clean his incisions with soap  and water.  He will continue a low-fat, low-sodium diet.   DISCHARGE FOLLOWUP:  He will have a PT and INR drawn on Friday, February 05, 2005, for monitoring of his Coumadin.  He will call for an appointment to  see Dr. Tamala [name] in 2 weeks.  He will have a chest x-ray at that visit.  He  will then follow up with Dr. Cyndia [name] in the [place] office on February 23, 2005 and should bring his chest x-ray for Dr. Cyndia [name] to review.   If he  experiences any problems or has questions in the interim, he is asked to  contact our office immediately.      Suzzanne Cloud, P.A.      [name], M.D.  Electronically Signed  GC/MEDQ  D:  02/02/2005  T:  02/02/2005  Job:  [id number]   cc:   Belva Crome, M.D.  Fax: [phone]   Candace Gallus, M.D.  Fax: [place]. [employer]

## 2010-08-21 NOTE — H&P (Signed)
[name], [name]               ACCOUNT NO.:  [id number]   MEDICAL RECORD NO.:  [id number]          PATIENT TYPE:  INP   LOCATION:  5151                         FACILITY:  [facility]   PHYSICIAN:  Corinna L. Conley Canal, MDDATE OF BIRTH:  1950/06/07   DATE OF ADMISSION:  12/10/2004  DATE OF DISCHARGE:                                HISTORY & PHYSICAL   Audio too short to transcribe (less than 5 seconds)      Corinna L. Conley Canal, MD     CLS/MEDQ  D:  12/10/2004  T:  12/10/2004  Job:  [id number]

## 2010-08-21 NOTE — Op Note (Signed)
[name], [name] NO.:  [id number]   MEDICAL RECORD NO.:  [id number]          PATIENT TYPE:  INP   LOCATION:  2314                         FACILITY:  [facility]   PHYSICIAN:  Gilford Raid, M.D.     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   OPERATIVE PROCEDURE:  Right thoracotomy, cardiopulmonary bypass via the  right common femoral vein and right common femoral artery, repair of  periprosthetic mitral valve leak with pledgeted sutures.   SURGEON:  Gilford Raid, M.D.   ASSISTANT:  Lanelle Bal, MD.   SECOND ASSISTANT:  Suzzanne Cloud, P.A.-C.   ANESTHESIA:  General endotracheal anesthesia.   CLINICAL HISTORY:  This patient is a 60 year old gentleman who underwent  initial mitral valve replacement in the 1970s with a tissue valve.  He  subsequently had redo mitral valve replacement with a St. Jude valve in 1988  by Dr. Linus Mako.  He did well until this past summer.  His wife and son  say that he developed fatigue over the summer and in July, he developed dark  urine suddenly and was found to be anemic.  Hematological workup was  consistent with hemolytic anemia.  A transesophageal echocardiogram was  performed to examine his prosthetic St. Jude mitral valve.  There was a  periprosthetic leak adjacent to the sewing ring with moderate to severe  mitral valve regurgitation through the periprosthetic leak.  The valve  leaflets appeared to be moving well.  Left ventricular function was well  preserved.  He continued to require transfusion every few weeks and,  therefore, it was felt that mitral valve surgery would be necessary.  I have  been following the patient for the past  several years with a 4.8 cm  ascending aortic aneurysm which has been stable.  He has no aortic  insufficiency.  He had blood cultures drawn which were negative.  His white  blood cell count was normal.  He had no fever or chills and no symptoms of  infection.  He was brought into the hospital and started on heparin and his  Coumadin was stopped.  He underwent cardiac catheterization earlier this  week which showed normal coronary arteries.  Plans were made for repair of  the periprosthetic leak.  I discussed the operative procedure with the him  including use of a right thoracotomy approach and repair of his mitral valve  leak or replacement of his mitral valve using hypothermic fibrillatory  arrest.  I discussed alternatives, benefits, and risks, including bleeding,  blood transfusion, infection, stroke, myocardial infarction, recurrence of  the perivalvular leak requiring further surgery, and death.  He understood  all this and agreed to proceed.   OPERATIVE PROCEDURE:  The patient was taken to the operating room and placed  on the table in supine position.  After induction  of general endotracheal  anesthesia using a double lumen tube, a Foley catheter was placed in the  bladder.  Then, transesophageal echocardiogram was performed by Dr. Orene Desanctis  and will be dictated separately.  This showed an area of perivalvular leak  along the posterior mitral annulus with severe regurgitation through this  leak.  The valve leaflets appeared to be moving normally.  Left ventricular  function was well preserved.  The patient had moderate pulmonary  hypertension when the pulmonary artery catheter was inserted.  There was no  aortic insufficiency.  There was no evidence of an atrial septal defect or  PFO.   Then the patient was positioned with the right side of the chest tilted up  about 30 degrees.  Then, the chest, abdomen, and both lower extremities were  prepped and draped in the usual sterile  fashion.  The right chest was  entered through an anterolateral thoracotomy incision at about the fifth  intercostal space.  There were adhesions between the lung and the chest wall  and these were divided using electrocautery.  Then, a vertical incision was  made in the right groin and the right common femoral artery and vein were  identified and controlled proximally and distally with vessel loops.  There  was no disease in the artery.  Then, the patient was heparinized and when an  adequate activated clotting time was achieved, the right common femoral  artery was cannulated using a 20 French arterial cannula for in flow.  Outflow was achieved using a Medtronic 25 [place] long triple staged staged  venous cannula.  A guide-wire was first passed into the right common femoral  vein and advanced up into the right atrium and then dilators were inserted  followed by the cannula.  It was placed under direct vision with  transesophageal echocardiogram and the tip was advanced up into the superior  vena cava.  The patient was then placed on cardiopulmonary bypass at  normothermia.  There was excellent flow.  The pump assisted venous return  was used.   Then, the remainder of the lung was dissected off the pericardium. The  phrenic nerve was identified and carefully preserved.  Then, an incision was  made vertically along the pericardium about 1 cm anterior to the phrenic  nerve.  The pericardium was dissected off the intra-atrial groove.  The  previous suture line was identified.  Then, the patient was cooled to 20  degrees Centigrade.  When the heart began to fibrillate, a fibrillator patch  was placed on the right ventricle and was activated.  Then, the left atrium  was opened in the intra-atrial groove.  Examination of the prosthetic mitral  valve showed that there was a large area of dehiscence of the sewing ring from the posterior mitral annulus.  The tissues appeared fairly strong in   this area.  There did not appear to be any signs of infection or abscess.  There were no vegetations on the valve.  The valve leaflets were moving  normally.  The anterior portion of the annulus appeared firmly adherent to  the sewing ring.  I felt that the best treatment at this point would be to  try to reapproximate the left atrial wall and annulus to the sewing ring.  This was accomplished with several 3-0 Prolene pledgeted horizontal mattress  sutures.  After these were placed, the left ventricle was allowed to fill  with blood and there was no visible leakage around the annulus.  The patient  was then rewarmed to 37 degrees centigrade.  The left atriotomy incision was  closed in two layers using continuous 3-0 Prolene suture.  The left side of  the heart was deaired as completely as possible.  After closure of the  atriotomy incision, the patient was defibrillated into sinus rhythm.  Then,  as further rewarming was performed, the heart was filled with blood and the  mitral valve appeared to be functioning normally.  The perivalvular region  was examined closely and there was no evidence of residual perivalvular  leak.  Left ventricular function appeared well preserved.  The aortic valve  had no stenosis or regurgitation.  Then, when the patient had been rewarmed  to 37 degrees Centigrade, he was weaned from cardiopulmonary bypass on low  dose Dopamine.  Total bypass time was 123 minutes.  Cardiac function  appeared excellent with a cardiac output of 7 liters per minute.  Protamine  was given.  The femoral venous and arterial cannulae were removed and the  vessels repaired with continuous 6-0 Prolene suture.   The patient was given 10 units of platelets due to thrombocytopenia with  platelet count of 60,000.  Hemostasis was achieved.  Then, an On-Q pain pump  was inserted.  The 10 cm sheath was inserted between the ribs and advanced  beneath the pleura in the posterior costovertebral  sulcus.  Through this  sheath, the infusion catheter was inserted and the sheath removed.  The  catheter was fixed to the skin with a silk suture.  The catheter was then  connected to the pump.  Then, two chest tubes were placed in the posterior  pleural space and one in the anterior pleural space.  The ribs were  reapproximated with 2-0 Vicryl pericostal sutures.  The muscles were closed  in layers using continuous #1 Vicryl suture.  The subcutaneous tissue was  closed with continuous 2-0 Vicryl and the skin with 3-0 Vicryl subcuticular  closure.  The right groin was then closed in layers in a similar manner.  The sponge, needle, and instrument counts were correct according to the  scrub nurse.  Dry, sterile dressings were applied to the incision and  around the chest tubes which were hooked to Pleur-evac suction.  The double  lumen tube was then changed to a single lumen tube by anesthesiology, and the patient was transferred to the surgical intensive care unit in guarded  but stable condition.      Gilford Raid, M.D.  Electronically Signed     BB/MEDQ  D:  01/28/2005  T:  01/28/2005  Job:  [id number]

## 2010-08-21 NOTE — Discharge Summary (Signed)
NAMEWILMONT, [name] NO.:  [id number]   MEDICAL RECORD NO.:  [id number]          PATIENT TYPE:  INP   LOCATION:  [id number]                         FACILITY:  [facility]   PHYSICIAN:  Benito Mccreedy, M.D.DATE OF BIRTH:  06/10/1950   DATE OF ADMISSION:  12/10/2004  DATE OF DISCHARGE:  12/11/2004                                 DISCHARGE SUMMARY   DISCHARGE MEDICATIONS:  1.  Cipro 500 mg p.o. b.i.d. for three days.  2.  The patient was asked to resume his iron, Coumadin, and Proscar as      previously.   He was told to follow up Dr. Alen Blew of Hematology in one week in our office.   CONDITION ON DISCHARGE:  Stable.   DISCHARGE DIAGNOSES:  1.  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  2.  History of mitral valve regurgitation on Coumadin.  3.  He has a history of adenomatous polyps in 2004.  4.  Has history of erectile dysfunction.   CONSULTANTS:  Dr. Alen Blew of Hematology .   REASON FOR ADMISSION:  Anemia and dyspnea.  The patient is a 60 year old  African-American gentleman with a past medical history of St. Jude's mitral  valve replacement in 1988 for rheumatic fever.  The patient is on chronic  anticoagulation and had a complaint of hematuria a few months prior to  admission.  The patient had been seen by Dr. Diona Fanti of Urology for  hematuria and a CT scan of the abdomen and pelvis was done, which was said  to unremarkable.  Patient has had a normal serology with cystoscopy.  He was  admitted because of complaints dyspnea on exertion with a drop in his  hemoglobin, as well as elevated bilirubin.  There was concerns that patient  may have a hemolytic anemia.  Patient was, therefore, admitted by Dr.  Conley Canal for further management. Anemia workup included iron studies,  peripheral smear,  haptoglobin, and B12, folate , as well as Coomb's test  was done.  He was transfused with packed red blood cells transfusion and was  seen by Hematology, and their impression  was that this likely represented a  non-immune hemolytic anemia with the differentials including medications to  assist in mechanical distortion, probably related to his mitral valve.  They  did not feel that patient had any evidence of TTP, or any form of  microangiopathic hemolysis.  After discussion with patient's Hematologist,  Dr. Alen Blew, and talking to the patient-decision was made to discharge  patient home with expeditious for Hematology for further outpatient review.  The patient was discharged in stable, satisfactory condition.      Benito Mccreedy, M.D.  Electronically Signed     GO/MEDQ  D:  03/17/2005  T:  03/18/2005  Job:  [id number]

## 2010-08-21 NOTE — Op Note (Signed)
[name], [name] NO.:  [id number]   MEDICAL RECORD NO.:  [id number]          PATIENT TYPE:  INP   LOCATION:  2314                         FACILITY:  [facility]   PHYSICIAN:  [name], M.D.DATE OF BIRTH:  1951/01/25   DATE OF PROCEDURE:  01/28/2005  DATE OF DISCHARGE:                                 OPERATIVE REPORT   PROCEDURE:  Transesophageal echocardiogram.   INDICATIONS FOR PROCEDURE:  [name] is a 60 year old gentleman who  presents today with probable mitral valve St. Jude dysfunction or  perivalvular leak.  He is brought to the OR today for possible mitral valve  repair or replacement to be performed by Dr. Gilford [name].   DESCRIPTION OF PROCEDURE:  Preoperatively, the pulmonary artery catheters  were inserted, radial arterial lines inserted.  The patient was then taken  to the OR for routine induction of general anesthesia.  The TE probe was  protected, lubricated, and passed oropharyngeally with moderate difficulty  and then slightly withdrawn for imaging of the cardiac structures.   PRE-CARDIOPULMONARY BYPASS EXAMINATION:  Left ventricle:  This is a normal  left ventricular chamber in both size, shape, and function seen in both long  and short axis views.  There is good overall contractility.  No masses were  noted within.   Aortic valve:  Immediately at the short axis view of the heart, there is a  fairly dilated aortic valvular annulus measuring at about 3.6 to 3.8 cm in  diameter at the root.  Leaflets were seen as a trileaflet apparatus.  These  leaflets were thin, compliant, mobile, and appear to open satisfactorily  during systolic ejection.  Doppler examination across the valve during  diastole revealed only a central small trace regurgitant jet noted.  However, the annular area and the area above the annulus was dilated to just  under 4 cm at one point, 3.9 at the widest.   Mitral valve:  The imaging was quite difficult due to  the large size of the  aortic root and due to the fact that we were in a lateral thoracotomy  position; however, there did appear to be a mechanical bileaflet apparatus  present in the mitral position.  The posterior leaflet was seen abest.  It  appears to open satisfactorily for inflow.  The anterior or anteriorly  placed leaflet of the apparatus appeared just mildly restricted on its  opening or slightly delayed in its opening compared to the posterior;  however, it does appear to be mobile, and so both leaflets appear to not  appose any obstruction to inflow during diastolic filling.  Doppler  examination across this valve immediately revealed in the area at the  posterior leaflet periphery and probably at the perivalvular junction of  this apparatus a fairly significant large regurgitant jet that does appear  to exist just outside of the ring of the St. Jude apparatus.  This jet is  broad-based, goes well into the deep portions of the left atrial chamber,  and is seen in multiple views.   Right ventricle:  The right ventricular chamber is  a moderately enlarged  chamber.   Tricuspid valve:  Thin, compliant, mobile tricuspid valve.  Trace  regurgitant flow was noted.   Right atrium:  Right atrial chamber is just mildly enlarged.  No masses were  noted within.  Pulmonary artery catheter was seen.   Left atrium:  Left atrial chamber, again, is somewhat generous in size at  the upper limits of normal at about 5.5 cm in the largest cross sectional  diameter that we could measure.   The patient was approached by Dr. Cyndia [name] via thoracotomy approach.  Femoral  cannulation was performed.  We visualized the venous cannula moving into the  right atrial chamber using the TEE.  The patient was then put on bypass,  hyperthermia begun, and the repair was per Dr. Cyndia [name] but consisted of a  repair of the perivalvular area and the annular area, and the valve was left  in place.  This was  considered a repair of the perivalvular leak.   __________ maneuvers were carried out.  The patient was rewarmed and  separated from cardiopulmonary bypass with the initial attempt.   POST-CARDIOPULMOLNARY BYPASS EXAMINATION:  In the post-bypass period, the  left ventricular chamber appeared to have good overall contractility, as  previously noted.  There were no changes in that.   Mitral valve:  The mitral valve was much more difficulty to visualize in the  post-repair period due to placement of the heart I think and some drop out  of imaging.  However, we were ultimately able to obtain enough visualization  to determine that both leaflets continued to move in the mechanical  apparatus, that there was only trace flame jets noted at the periphery of  the valve structure and ring.  These were normal for this type of apparatus.  These were also small.  The area of the perivalvular leak previously that  had existed was now gone.  It did not appear in any of the post-bypass  images, so this was considered a satisfactory repair.   Aortic valve:  The aortic valve was as previously described.  In the area  above the aortic valve was a small collection of microscopic air bubbles  which with time were then gone.  The rest of the cardiac examination was as  previously described, without any significant changes.   The patient was returned to the cardiac intensive care unit in stable  condition.           ______________________________  [name], M.D.     JTM/MEDQ  D:  01/28/2005  T:  01/29/2005  Job:  [id number]

## 2010-08-21 NOTE — H&P (Signed)
NAMEMCCAULEY, FEEZOR NO.:  [id number]   MEDICAL RECORD NO.:  [id number]          PATIENT TYPE:  INP   LOCATION:  3736                         FACILITY:  [facility]   PHYSICIAN:  Jettie Booze, MDDATE OF BIRTH:  [birth year]   DATE OF ADMISSION:  01/22/2005  DATE OF DISCHARGE:                                HISTORY & PHYSICAL   PRIMARY CARDIOLOGIST:  Belva Crome, M.D.   PRIMARY CARE PHYSICIAN/HEMATOLOGIST:  Firas N. Alen Blew, M.D.   CVTS:  Gilford Raid, M.D.   CHIEF COMPLAINT:  Hemolytic anemia requiring transfusion.   HISTORY OF PRESENT ILLNESS:  Mr. Retana is a 60 year old male patient,  history of rheumatic valvular disease.  He has had two mitral valve  replacements.  The initial valve was placed in the late 1970s and was a  tissue prosthesis.  There was dysfunction of this valve and subsequent  replacement was performed with a St. Jude prosthesis in 1988.  Over the past  three to six months, the patient has developed progressive fatigue and  ultimately has been diagnosed with hemolytic anemia.  He has recently had a  transesophageal echocardiogram done that demonstrated a moderate to  moderately severe perivalvular leak.  He has had an extensive hemolytic  anemia workup done by Dr. Alen Blew which has demonstrated a E6PD deficiency,  but Dr. Alen Blew is fairly certain that the patient's hemolytic anemia is  related to red blood cell destruction caused by the patient's valve.  His  most recent transfusion was on January 01, 2005.  He had blood cultures  drawn during that transfusion as well.   Because of the patient's recurrent fatigue and weakness, a hemoglobin was  checked today and this came back low at 6.5, with a hematocrit of 8.1.  Because of these findings, the patient is to be admitted for packed red  blood cells infusion, IV heparin replacement for Coumadin, telemetry  monitoring, and cardiac catheterization later this week in preparation for  mitral valve replacement later in the week.   REVIEW OF SYSTEMS:  Again, as stated above, the patient with excessive  fatigue and weakness over the past three to six months secondary to anemia.  No recent fevers, chills, cough, or cold.  He is complaining of some  hematuria over the past few months.  No dark bloody stools, no hematemesis,  no abdominal pain, no swelling of the legs, no leg cramps, no palpitations.  He does have mild dyspnea on exertion, no orthopnea.  No syncope or near-  syncope, no dizziness.   ALLERGIES:  1.  PENICILLIN.  2.  AMPICILLIN.   CURRENT MEDICATIONS:  1.  Coumadin 5 mg tablets take as directed, I do not have the patient's most      recent schedule of this medication.  2.  Iron supplementation as prescribed by Dr. Alen Blew.  3.  Proscar 5 mg daily.   SOCIAL HISTORY:  The patient occasionally smokes cigars, no alcohol, no IV  drug use, no herbal medications.  He lives in [place].  He works for  [employer].   FAMILY HISTORY:  No history  of early coronary artery disease.   PAST MEDICAL HISTORY:  1.  Rheumatic mitral valve disease.      1.  Tissue prosthetic valve in the late 1970s.      2.  St. Jude prosthesis in 1988.  2.  Recent transesophageal echocardiogram demonstrating moderate to      moderately severe perivalvular leak.  3.  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  4.  Recurrent blood transfusions secondary to anemia.  5.  Fatigue, shortness of breath, and weakness secondary to anemia.   PHYSICAL EXAMINATION:  GENERAL:  This is a pleasant male currently  complaining of weakness, fatigue, and dyspnea on exertion.  VITAL SIGNS:  Temperature 98.4, blood pressure 128/84, pulse 84 and regular,  respirations 20.  NEUROLOGIC:  The patient is alert and oriented x3, moving all extremities  x4.  No focal deficits.  HEENT:  Head is normocephalic.  Sclerae are  icteric bilaterally.  Conjunctivae are pale.  NECK:  Supple, no adenopathy.  CHEST:  Bilateral lung sounds are clear to auscultation posteriorly.  Respiratory effort is non-labored.  CARDIAC:  There is a grade 2/6 systolic murmur best heard posteriorly just  under the left shoulder blade.  There is no JVD.  Carotid's are 2+  bilaterally, no bruits.  Pulses regular.  The patient is currently not on  telemetry monitoring in the ER.  ABDOMEN:  Soft, nontender, nondistended, without hepatosplenomegaly, masses,  or bruits noted.  EXTREMITIES:  Symmetrical in appearance without cyanosis, clubbing, or  edema.   LABORATORY DATA:  Hemoglobin at the office today was 6.5, hematocrit 18,  platelets 219,000.  Repeat labs to be drawn upon admission are as follows:  PT, PTT, CBC, CMET, urinalysis, BNP, type and cross match for 3 units of  packed red cells.   DIAGNOSTICS:  EKG and PA and lateral chest are pending after admission.   ASSESSMENT:  1.  Severe hemolytic anemia requiring blood transfusion.  2.  Mechanical prosthetic valve, on chronic Coumadin anticoagulation.  3.  Rheumatic mitral valve disease with moderate to moderately severe      perivalvular leak in the St. Jude prosthesis requiring eventual repair      or removal.  4.  E6PD deficiency.   PLAN:  1.  The patient is to be admitted to the telemetry unit.  Coumadin will be      placed on hold.  He will continue his Proscar.  2.  Because Coumadin is on hold, intravenous heparin will be started once      INR is less than or equal to 2.5.  3.  Labs as stated above.  4.  Plan on transfusion of 2 units of packed red blood cells each over three      to four hours.  We will check a hemoglobin two hours after the second      unit, and if hemoglobin is still less than 9.5, transfuse a third unit      over three to four hours.  Lasix 40 mg intravenously after unit two or     between units one and two if any dyspnea.  Check a CBC in the  morning.  5.  Plans on performing diagnostic coronary angiography once the patient's      INR is less than 2.  6.  Dr. Tamala Julian has spoken with Dr. Cyndia Bent and once cardiac catheterization is  complete plan on proceeding on with valve replacement surgery some time      next week.      Cecilia Lissa Merlin, N.P.    ______________________________  Jettie Booze, MD    ALE/MEDQ  D:  01/22/2005  T:  01/23/2005  Job:  [id number]   cc:   Mathis Dad. Lovie Chol, M.D.  [address]  [place]  [place] [address]

## 2010-12-31 ENCOUNTER — Other Ambulatory Visit: Payer: Self-pay | Admitting: Family Medicine

## 2010-12-31 ENCOUNTER — Ambulatory Visit
Admission: RE | Admit: 2010-12-31 | Discharge: 2010-12-31 | Disposition: A | Discharge status: Home / Self Care | Payer: 59 | Source: Ambulatory Visit | Attending: Family Medicine | Admitting: Family Medicine

## 2010-12-31 DIAGNOSIS — M79606 Pain in leg, unspecified: Secondary | ICD-10-CM

## 2011-01-05 LAB — PROTIME-INR
INR: 3.2 — ABNORMAL HIGH
Prothrombin Time: 35.3 — ABNORMAL HIGH

## 2011-01-05 LAB — CBC
HCT: 34.2 — ABNORMAL LOW
Hemoglobin: 11.4 — ABNORMAL LOW
MCHC: 33.2
MCV: 100.4 — ABNORMAL HIGH
Platelets: 146 — ABNORMAL LOW
RBC: 3.41 — ABNORMAL LOW
RDW: 14.3
WBC: 5.9

## 2011-11-26 DIAGNOSIS — Z881 Allergy status to other antibiotic agents status: Secondary | ICD-10-CM | POA: Insufficient documentation

## 2011-11-26 DIAGNOSIS — Z7901 Long term (current) use of anticoagulants: Secondary | ICD-10-CM | POA: Insufficient documentation

## 2011-11-26 DIAGNOSIS — F17200 Nicotine dependence, unspecified, uncomplicated: Secondary | ICD-10-CM | POA: Insufficient documentation

## 2011-11-26 DIAGNOSIS — Z954 Presence of other heart-valve replacement: Secondary | ICD-10-CM | POA: Insufficient documentation

## 2011-11-26 DIAGNOSIS — Z880 Allergy status to penicillin: Secondary | ICD-10-CM | POA: Insufficient documentation

## 2011-11-26 DIAGNOSIS — H1130 Conjunctival hemorrhage, unspecified eye: Secondary | ICD-10-CM | POA: Insufficient documentation

## 2011-11-26 DIAGNOSIS — I059 Rheumatic mitral valve disease, unspecified: Secondary | ICD-10-CM | POA: Insufficient documentation

## 2011-11-27 ENCOUNTER — Encounter (HOSPITAL_COMMUNITY): Payer: Self-pay | Admitting: Emergency Medicine

## 2011-11-27 ENCOUNTER — Emergency Department (HOSPITAL_COMMUNITY)
Admission: EM | Admit: 2011-11-27 | Discharge: 2011-11-27 | Disposition: A | Discharge status: Home / Self Care | Payer: 59 | Attending: Emergency Medicine | Admitting: Emergency Medicine

## 2011-11-27 DIAGNOSIS — H1130 Conjunctival hemorrhage, unspecified eye: Secondary | ICD-10-CM

## 2011-11-27 HISTORY — DX: Rheumatic mitral valve disease, unspecified: I05.9

## 2011-11-27 LAB — PROTIME-INR
INR: 2.5 — ABNORMAL HIGH (ref 0.00–1.49)
Prothrombin Time: 27.4 seconds — ABNORMAL HIGH (ref 11.6–15.2)

## 2011-11-27 NOTE — ED Notes (Signed)
Pt states he was driving home from [place] today and noticed the corner of his right eye was a little red  Pt states by the time he got home it was worse  Pt states it does not hurt  Pt has a scleral hemorrhage noted to the eye  Pt denies injury of any kind

## 2011-11-27 NOTE — ED Provider Notes (Signed)
History     CSN: [id number]  Arrival date & time 11/26/11  2224   First MD Initiated Contact with Patient 11/27/11 [phone]      Chief Complaint  Patient presents with  . eye redness     (Consider location/radiation/quality/duration/timing/severity/associated sxs/prior treatment) HPI Comments: [name] is a 61 y.o. Male who presents with complaint of right eye redness. States he was driving when noted that the right corner of his eye turned red. Pt denies pain in the eye, denies headache, no visual changes. Denies eye itchiness, denies injury, no drainage. No other complaints.   The history is provided by the patient.    Past Medical History  Diagnosis Date  . Mitral valve disease     Past Surgical History  Procedure Date  . Mitral valve replacement     History reviewed. No pertinent family history.  History  Substance Use Topics  . Smoking status: Current Some Day Smoker    Types: Cigars  . Smokeless tobacco: Not on file  . Alcohol Use: No      Review of Systems  Constitutional: Negative for fever and chills.  HENT: Negative for neck pain and neck stiffness.   Eyes: Positive for redness. Negative for photophobia, pain, discharge, itching and visual disturbance.  Respiratory: Negative.   Cardiovascular: Negative.   Musculoskeletal: Negative.   Skin: Negative.   Neurological: Negative for dizziness, weakness and headaches.  Hematological: Bruises/bleeds easily.    Allergies  Ampicillin and Penicillins  Home Medications   Current Outpatient Rx  Name Route Sig Dispense Refill  . ACETAMINOPHEN 325 MG PO TABS Oral Take 650 mg by mouth every 6 (six) hours as needed.    . WARFARIN SODIUM 5 MG PO TABS Oral Take 5-7.5 mg by mouth daily. Take 5 mg only on Tuesday and take 7.5mg  on the remaining day      BP 143/71  Pulse 60  Temp 98 F (36.7 C) (Oral)  Resp 16  SpO2 91%  Physical Exam  Nursing note and vitals reviewed. Constitutional: He is oriented  to person, place, and time. He appears well-developed and well-nourished. No distress.  HENT:  Head: Normocephalic and atraumatic.  Right Ear: External ear normal.  Left Ear: External ear normal.  Mouth/Throat: Oropharynx is clear and moist.  Eyes: EOM and lids are normal. Pupils are equal, round, and reactive to light. Right eye exhibits no discharge and no exudate. Left eye exhibits no discharge and no exudate. Right conjunctiva has a hemorrhage. Left conjunctiva has no hemorrhage. No scleral icterus.  Fundoscopic exam:      The right eye shows no hemorrhage and no papilledema.       The left eye shows no hemorrhage and no papilledema.  Slit lamp exam:      The right eye shows no hyphema and no hypopyon.       The left eye shows no hyphema and no hypopyon.    Neck: Neck supple.  Cardiovascular: Normal rate and regular rhythm.   Murmur heard. Pulmonary/Chest: Effort normal and breath sounds normal. No respiratory distress. He has no wheezes. He has no rales.  Musculoskeletal: Normal range of motion. He exhibits no edema.  Neurological: He is alert and oriented to person, place, and time.  Skin: Skin is warm and dry.  Psychiatric: He has a normal mood and affect.    ED Course  Procedures (including critical care time) Pt with non traumatic subconjunctival hemorrhage. On coumadin for mitral valve replacement. Will  get INR and visual acuity.   Vision 20/20,   Results for orders placed during the hospital encounter of 11/27/11  PROTIME-INR      Component Value Range   Prothrombin Time 27.4 (*) 11.6 - 15.2 seconds   INR 2.50 (*) 0.00 - 1.49   No results found.  Pt with non traumatic subconjunctival hemorrhage, suspect probably from his coumadin use. No other testing necessary at this time. Will d/c home.   1. Subconjunctival hemorrhage       MDM         Renold Genta, PA 11/27/11 [phone]

## 2011-11-27 NOTE — ED Notes (Signed)
PA at bedside.

## 2011-12-02 NOTE — ED Provider Notes (Signed)
Medical screening examination/treatment/procedure(s) were performed by non-physician practitioner and as supervising physician I was immediately available for consultation/collaboration.   Alfonzo Feller, DO 12/02/11 1548

## 2011-12-16 ENCOUNTER — Other Ambulatory Visit: Payer: Self-pay | Admitting: Surgery

## 2011-12-16 DIAGNOSIS — I712 Thoracic aortic aneurysm, without rupture: Secondary | ICD-10-CM

## 2012-01-24 ENCOUNTER — Other Ambulatory Visit: Payer: 59

## 2012-01-25 ENCOUNTER — Ambulatory Visit: Payer: 59 | Admitting: Surgery

## 2012-02-08 ENCOUNTER — Ambulatory Visit: Payer: 59 | Admitting: Surgery

## 2012-02-08 ENCOUNTER — Other Ambulatory Visit: Payer: 59

## 2012-02-22 ENCOUNTER — Ambulatory Visit: Payer: 59 | Admitting: Surgery

## 2012-02-22 ENCOUNTER — Inpatient Hospital Stay: Admission: RE | Admit: 2012-02-22 | Payer: 59 | Source: Ambulatory Visit

## 2012-03-10 ENCOUNTER — Other Ambulatory Visit: Payer: Self-pay | Admitting: Family Medicine

## 2012-05-02 ENCOUNTER — Encounter: Payer: Self-pay | Admitting: Surgery

## 2012-05-02 ENCOUNTER — Ambulatory Visit (INDEPENDENT_AMBULATORY_CARE_PROVIDER_SITE_OTHER): Payer: 59 | Admitting: Surgery

## 2012-05-02 ENCOUNTER — Ambulatory Visit
Admission: RE | Admit: 2012-05-02 | Discharge: 2012-05-02 | Disposition: A | Discharge status: Home / Self Care | Payer: 59 | Source: Ambulatory Visit | Attending: Surgery | Admitting: Surgery

## 2012-05-02 VITALS — BP 114/68 | HR 59 | Resp 16 | Ht 74.0 in | Wt 156.0 lb

## 2012-05-02 DIAGNOSIS — Z954 Presence of other heart-valve replacement: Secondary | ICD-10-CM

## 2012-05-02 DIAGNOSIS — Z952 Presence of prosthetic heart valve: Secondary | ICD-10-CM

## 2012-05-02 DIAGNOSIS — I712 Thoracic aortic aneurysm, without rupture: Secondary | ICD-10-CM

## 2012-05-02 MED ORDER — GADOBENATE DIMEGLUMINE 529 MG/ML IV SOLN
14.0000 mL | Freq: Once | INTRAVENOUS | Status: AC | PRN
  Administered 2012-05-02: 14 mL via INTRAVENOUS

## 2012-05-02 NOTE — Progress Notes (Signed)
[address]            [place] [address]          [phone]      HPI:  The patient is a 62 year old gentleman who is well known to me from previous cardiac surgery. 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 This subsequently failed and was replaced with a St. Jude mechanical valve by Dr. Linus Mako in 1988. 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 He underwent repair of the periprosthetic mitral leak on 01/28/2005 through a right thoracotomy approach and the posterior periprosthetic leak was repaired with pledgeted horizontal mattress sutures. 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 I saw him again in October 2011 at which time he mentioned that he developed weakness and anemia in June 2011 and underwent a GI workup that did not show any signs of GI blood loss. He was seen by urology for workup of his dark urine but no cause of hematuria could be found. I last saw him in March of 2012 when he presented with a 6 week history of progressive dyspnea on exertion and lower extremity swelling and had a laboratory workup consistent with hemolysis. 2-D echocardiogram followed by TEE showed severe mitral regurgitation that was felt to be coming from a peri-valvular leak. CT scan of the chest, abdomen, and pelvis at that time showed no change in the size of his 5 cm aortic root aneurysm. He was subtotally referred to Dr. Evelina Dun at [facility] and underwent repeat right thoracotomy with mitral valve replacement using a new  mechanical valve prosthesis. He said that he received blood products during that procedure and was in the hospital for about 10 days postoperatively with a slow recovery. He was subsequently diagnosed with acute hepatitis C which was felt to be secondary to blood transfusion. He eventually recovered and is back at work. He returns today for routine followup of his aortic root aneurysm. He denies any chest pain or pressure.  Current Outpatient Prescriptions  Medication Sig Dispense Refill  . acetaminophen (TYLENOL) 325 MG tablet Take 650 mg by mouth every 6 (six) hours as needed.      . docusate sodium (COLACE) 100 MG capsule Take 100 mg by mouth 2 (two) times daily as needed.      . ferrous sulfate 325 (65 FE) MG tablet Take 325 mg by mouth 2 (two) times daily.      Marland Kitchen warfarin (COUMADIN) 5 MG tablet Take 5-7.5 mg by mouth daily. Take 5 mg only on Tuesday and take 7.5mg  on the remaining day         Physical Exam: BP 114/68  Pulse 59  Resp 16  Ht 6\' 2"  (1.88 m)  Wt 156 lb (70.761 kg)  BMI 20.03 kg/m2  SpO2 96% He looks well. Cardiac exam shows a regular rate and rhythm with a crisp mechanical valve click. There is no murmur, rub, or gallop.  His lung exam is clear. There is no peripheral edema.  Diagnostic Tests:  *RADIOLOGY REPORT*   Clinical Data: Follow-up of thoracic aortic aneurysm.  History of prior mitral valve replacement.   MRA CHEST WITH OR WITHOUT CONTRAST   Contrast: 27mL MULTIHANCE GADOBENATE DIMEGLUMINE 529 MG/ML IV SOLN   Comparison: Multiple prior chest MRA and CT studies dating back to 2007.  The most recent comparison study is a CT of the chest on 08/07/2010.   Findings: There is stable aneurysmal dilatation of the aortic root at the level of the sinuses of Valsalva.  Measurement of this dilatation is somewhat difficult due to some asymmetric enlargement of the left aortic sinus.  Maximal diameter obtained by measurement at the level of the sinuses of  Valsalva is approximately 4.9 - 5.0 cm.   The proximal aorta then tapers to a caliber of 3.7 cm just prior to the aortic arch.  The proximal aortic arch measures 3.3 cm.  The descending thoracic aorta remains of normal caliber and measures 2.7 cm.   There is no evidence of aortic dissection or intramural hemorrhage. Stable dilatation of the main pulmonary artery segment which measures 4.1 cm in greatest diameter.  Stable artifact from a prosthetic mitral valve.  No gross change in cardiac chamber size. No pleural or pericardial fluid is identified.  No incidental mass lesions or enlarged lymph nodes are identified by MRI.   IMPRESSION: Stable aneurysmal disease of the aortic root with maximal measured diameter of 4.9 - 5.0 cm.  There remains relative asymmetric dilatation of the left aortic sinus compared to the right and noncoronary sinuses.  The aorta tapers to a normal caliber by the level of the aortic arch.  No associated dissection or other complication identified by MRI.     Original Report Authenticated By: Aletta Edouard, M.D.   Impression:  He has a stable 5 cm aortic root aneurysm with no evidence of aortic insufficiency on clinical exam. Although he is only 62 years old, he has had 4 prior cardiac surgeries and I think a conservative approach is warranted. This aneurysm has not changed in size since I began following him in 2006. I discussed the indications for surgical treatment of his aortic root aneurysm. I told him that I would continue to follow his aneurysm unless he developed progressive enlargement to 5.5 cm or greater.  Plan:  I will plan to see him back in one year with an MR angiogram of the chest.

## 2012-05-29 ENCOUNTER — Other Ambulatory Visit: Payer: Self-pay | Admitting: Orthopedic Surgery

## 2012-05-29 DIAGNOSIS — R2231 Localized swelling, mass and lump, right upper limb: Secondary | ICD-10-CM

## 2012-05-30 LAB — CREATININE, SERUM: Creat: 1.4 mg/dL — ABNORMAL HIGH (ref 0.50–1.35)

## 2012-05-31 ENCOUNTER — Inpatient Hospital Stay
Admission: RE | Admit: 2012-05-31 | Discharge: 2012-05-31 | Disposition: A | Discharge status: Home / Self Care | Payer: 59 | Source: Ambulatory Visit | Attending: Orthopedic Surgery | Admitting: Orthopedic Surgery

## 2012-05-31 DIAGNOSIS — R29898 Other symptoms and signs involving the musculoskeletal system: Secondary | ICD-10-CM

## 2012-06-01 ENCOUNTER — Other Ambulatory Visit: Payer: Self-pay | Admitting: Orthopedic Surgery

## 2012-06-01 DIAGNOSIS — M25421 Effusion, right elbow: Secondary | ICD-10-CM

## 2012-06-10 ENCOUNTER — Other Ambulatory Visit: Payer: 59

## 2012-06-11 ENCOUNTER — Ambulatory Visit
Admission: RE | Admit: 2012-06-11 | Discharge: 2012-06-11 | Disposition: A | Discharge status: Home / Self Care | Payer: 59 | Source: Ambulatory Visit | Attending: Orthopedic Surgery | Admitting: Orthopedic Surgery

## 2012-06-11 DIAGNOSIS — M25421 Effusion, right elbow: Secondary | ICD-10-CM

## 2012-06-11 MED ORDER — GADOBENATE DIMEGLUMINE 529 MG/ML IV SOLN
14.0000 mL | Freq: Once | INTRAVENOUS | Status: AC | PRN
  Administered 2012-06-11: 14 mL via INTRAVENOUS

## 2012-06-21 ENCOUNTER — Other Ambulatory Visit: Payer: Self-pay

## 2012-06-21 DIAGNOSIS — I721 Aneurysm of artery of upper extremity: Secondary | ICD-10-CM

## 2012-07-26 ENCOUNTER — Encounter: Payer: Self-pay | Admitting: Vascular Surgery

## 2012-07-27 ENCOUNTER — Encounter: Payer: 59 | Admitting: Vascular Surgery

## 2012-11-13 ENCOUNTER — Other Ambulatory Visit: Payer: Self-pay | Admitting: Gastroenterology

## 2012-11-14 ENCOUNTER — Encounter (HOSPITAL_COMMUNITY): Payer: Self-pay | Admitting: *Deleted

## 2012-11-14 ENCOUNTER — Encounter (HOSPITAL_COMMUNITY)
Admission: RE | Disposition: A | Discharge status: Home / Self Care | Payer: Self-pay | Source: Ambulatory Visit | Attending: Gastroenterology

## 2012-11-14 ENCOUNTER — Ambulatory Visit (HOSPITAL_COMMUNITY)
Admission: RE | Admit: 2012-11-14 | Discharge: 2012-11-14 | Disposition: A | Discharge status: Home / Self Care | Payer: 59 | Source: Ambulatory Visit | Attending: Gastroenterology | Admitting: Gastroenterology

## 2012-11-14 DIAGNOSIS — K5730 Diverticulosis of large intestine without perforation or abscess without bleeding: Secondary | ICD-10-CM | POA: Insufficient documentation

## 2012-11-14 DIAGNOSIS — K648 Other hemorrhoids: Secondary | ICD-10-CM | POA: Insufficient documentation

## 2012-11-14 DIAGNOSIS — D126 Benign neoplasm of colon, unspecified: Secondary | ICD-10-CM | POA: Insufficient documentation

## 2012-11-14 DIAGNOSIS — Z860100 Personal history of colon polyps, unspecified: Secondary | ICD-10-CM

## 2012-11-14 HISTORY — DX: Testicular hypofunction: E29.1

## 2012-11-14 HISTORY — DX: Benign prostatic hyperplasia without lower urinary tract symptoms: N40.0

## 2012-11-14 HISTORY — DX: Personal history of other diseases of the nervous system and sense organs: Z86.69

## 2012-11-14 HISTORY — DX: Thoracic aortic aneurysm, without rupture: I71.2

## 2012-11-14 HISTORY — DX: Polyp of colon: K63.5

## 2012-11-14 HISTORY — PX: COLONOSCOPY: SHX5424

## 2012-11-14 HISTORY — DX: Unspecified atrial fibrillation: I48.91

## 2012-11-14 HISTORY — DX: Aneurysm of the ascending aorta, without rupture: I71.21

## 2012-11-14 HISTORY — DX: Unspecified viral hepatitis C without hepatic coma: B19.20

## 2012-11-14 HISTORY — DX: Family history of ischemic heart disease and other diseases of the circulatory system: Z82.49

## 2012-11-14 HISTORY — DX: Essential (primary) hypertension: I10

## 2012-11-14 SURGERY — COLONOSCOPY
Anesthesia: Moderate Sedation

## 2012-11-14 MED ORDER — FENTANYL CITRATE 0.05 MG/ML IJ SOLN
INTRAMUSCULAR | Status: AC
  Filled 2012-11-14: qty 4

## 2012-11-14 MED ORDER — FENTANYL CITRATE 0.05 MG/ML IJ SOLN
INTRAMUSCULAR | Status: DC | PRN
  Administered 2012-11-14 (×3): 25 ug via INTRAVENOUS

## 2012-11-14 MED ORDER — MIDAZOLAM HCL 5 MG/5ML IJ SOLN
INTRAMUSCULAR | Status: DC | PRN
  Administered 2012-11-14 (×3): 2 mg via INTRAVENOUS
  Administered 2012-11-14: 1 mg via INTRAVENOUS

## 2012-11-14 MED ORDER — MIDAZOLAM HCL 10 MG/2ML IJ SOLN
INTRAMUSCULAR | Status: AC
  Filled 2012-11-14: qty 4

## 2012-11-14 MED ORDER — VANCOMYCIN HCL IN DEXTROSE 1-5 GM/200ML-% IV SOLN
1000.0000 mg | Freq: Once | INTRAVENOUS | Status: AC
  Administered 2012-11-14: 1000 mg via INTRAVENOUS
  Filled 2012-11-14: qty 200

## 2012-11-14 MED ORDER — SODIUM CHLORIDE 0.9 % IV SOLN: INTRAVENOUS | Status: DC

## 2012-11-14 NOTE — Addendum Note (Signed)
Addended by: Breylon Sherrow on: 11/14/2012 07:30 AM   Modules accepted: Orders  

## 2012-11-14 NOTE — Interval H&P Note (Signed)
History and Physical Interval Note:  11/14/2012 9:09 AM  [name]  has presented today for surgery, with the diagnosis of hx of polyps  The various methods of treatment have been discussed with the patient and family. After consideration of risks, benefits and other options for treatment, the patient has consented to  Procedure(s): COLONOSCOPY (N/A) as a surgical intervention .  The patient's history has been reviewed, patient examined, no change in status, stable for surgery.  I have reviewed the patient's chart and labs.  Questions were answered to the patient's satisfaction.     Eleele C.

## 2012-11-14 NOTE — Op Note (Signed)
[facility] [place], [address]   COLONOSCOPY PROCEDURE REPORT  PATIENT: [name], [name]  MR#: [id number] BIRTHDATE: 08-May-1950 , 62  yrs. old GENDER: Male ENDOSCOPIST: Wilford Corner, MD REFERRED BY: PROCEDURE DATE:  11/14/2012 PROCEDURE:   Colonoscopy with snare polypectomy ASA CLASS:   Class III INDICATIONS:Patient's personal history of colon polyps. MEDICATIONS: Fentanyl 75 mcg IV and Versed 7 mg IV  DESCRIPTION OF PROCEDURE:   After the risks benefits and alternatives of the procedure were thoroughly explained, informed consent was obtained.  The     endoscope was introduced through the anus and advanced to the cecum, which was identified by both the appendix and ileocecal valve , limited by No adverse events experienced.   The quality of the prep was good. .  The instrument was then slowly withdrawn as the colon was fully examined.     FINDINGS:  Rectal exam unremarkable.  Pediatric colonoscope inserted into the colon and advanced to the cecum, where the appendiceal orifice and ileocecal valve were identified.  Loop reduction was needed in order to reach the cecum. A 3 mm semi-sessile polyp was removed from the cecum.    On careful withdrawal of the colonoscope a 1.4 cm pedunculated polyp was removed with snare cautery.   A few small sigmoid diverticuli were noted. Retroflexion revealed small internal hemorrhoids.  COMPLICATIONS: None  IMPRESSION:     1. Colon polyps - s/p snare cautery and biopsy as stated above 2. Sigmoid diverticulosis 3. Internal hemorrhoids  RECOMMENDATIONS: F/U on path; Resume anticoagulation per recs on chart; F/U on path; High fiber diet    ______________________________ eSigned:  Wilford Corner, MD 11/14/2012 10:05 AM   [id number] Little, MD

## 2012-11-14 NOTE — H&P (Signed)
  Date of Initial H&P: 11/08/12  History reviewed, patient examined, no change in status, stable for surgery. Colonoscopy for history of colon polyps.

## 2012-11-15 ENCOUNTER — Encounter (HOSPITAL_COMMUNITY): Payer: Self-pay | Admitting: Gastroenterology

## 2013-01-01 DIAGNOSIS — B1920 Unspecified viral hepatitis C without hepatic coma: Secondary | ICD-10-CM | POA: Insufficient documentation

## 2013-01-08 ENCOUNTER — Ambulatory Visit (INDEPENDENT_AMBULATORY_CARE_PROVIDER_SITE_OTHER): Payer: 59 | Admitting: Podiatry

## 2013-01-08 ENCOUNTER — Ambulatory Visit (INDEPENDENT_AMBULATORY_CARE_PROVIDER_SITE_OTHER): Payer: 59

## 2013-01-08 ENCOUNTER — Ambulatory Visit (INDEPENDENT_AMBULATORY_CARE_PROVIDER_SITE_OTHER): Payer: 59 | Admitting: Pharmacist

## 2013-01-08 ENCOUNTER — Encounter: Payer: Self-pay | Admitting: Podiatry

## 2013-01-08 VITALS — BP 119/62 | HR 60 | Resp 12 | Ht 73.0 in | Wt 155.0 lb

## 2013-01-08 DIAGNOSIS — M79609 Pain in unspecified limb: Secondary | ICD-10-CM

## 2013-01-08 DIAGNOSIS — Z954 Presence of other heart-valve replacement: Secondary | ICD-10-CM

## 2013-01-08 DIAGNOSIS — M79674 Pain in right toe(s): Secondary | ICD-10-CM

## 2013-01-08 DIAGNOSIS — I059 Rheumatic mitral valve disease, unspecified: Secondary | ICD-10-CM | POA: Insufficient documentation

## 2013-01-08 DIAGNOSIS — M2040 Other hammer toe(s) (acquired), unspecified foot: Secondary | ICD-10-CM

## 2013-01-08 DIAGNOSIS — L851 Acquired keratosis [keratoderma] palmaris et plantaris: Secondary | ICD-10-CM

## 2013-01-08 DIAGNOSIS — M779 Enthesopathy, unspecified: Secondary | ICD-10-CM

## 2013-01-08 LAB — POCT INR: INR: 2.8

## 2013-01-08 MED ORDER — TRIAMCINOLONE ACETONIDE 10 MG/ML IJ SUSP
5.0000 mg | Freq: Once | INTRAMUSCULAR | Status: AC
  Administered 2013-01-08: 5 mg via INTRA_ARTICULAR

## 2013-01-08 NOTE — Patient Instructions (Signed)
Call with questions.

## 2013-01-08 NOTE — Progress Notes (Signed)
Subjective:     Patient ID: [name], male   DOB: 13-Oct-1950, 62 y.o.   MRN: [id number]  HPI I. have terrible pain between the fourth and fifth toe right foot. I have a lesion on the bottom I. left toe which has become sore. States the pain in the right foot has been intensifying over the last 6 months   Review of Systems  All other systems reviewed and are negative.       Objective:   Physical Exam  Cardiovascular: Intact distal pulses.    neurological intact bilateral. Equinus noted bilateral. Severe keratotic lesion between the fourth and fifth toes right foot with fluid buildup. Lesion plantar aspect left foot lateral side. Systems review was done with the patient.     Assessment:     Interspace callus formation right fourth fifth toe. Fluid buildup consistent with capsulitis of the fourth MPJ right. Lesion left consistent with porokeratosis.    Plan:     H&P performed. X-rays reviewed. Injected the right fourth MPJ interspace 3 mg analog dexamethasone and 5 mg Xylocaine. debride lesion fourth interspace with sharp instrumentation and also plantar left. Discussed possible surgery for right fourth interspace consisting of syndactylization procedure with removal of bone.

## 2013-01-08 NOTE — Progress Notes (Signed)
N- SORE L- LT. FOOT D- 1 YEAR O- SLOWLY C-  WORSE A - PRESSURE T - NONE

## 2013-01-08 NOTE — Progress Notes (Signed)
N- BURNING L- 5TH TOE D-3 MONTHS O- SLOWLY C- WORSE A - WALKING T- ICE PACK

## 2013-02-05 ENCOUNTER — Encounter: Payer: Self-pay | Admitting: Podiatry

## 2013-02-05 ENCOUNTER — Ambulatory Visit (INDEPENDENT_AMBULATORY_CARE_PROVIDER_SITE_OTHER): Payer: 59 | Admitting: Podiatry

## 2013-02-05 VITALS — BP 116/57 | HR 61 | Resp 12

## 2013-02-05 DIAGNOSIS — L84 Corns and callosities: Secondary | ICD-10-CM

## 2013-02-05 DIAGNOSIS — M2040 Other hammer toe(s) (acquired), unspecified foot: Secondary | ICD-10-CM

## 2013-02-05 NOTE — Progress Notes (Signed)
Subjective:     Patient ID: [name], male   DOB: 20-Feb-1951, 62 y.o.   MRN: [id number]  Toe Pain    patient presents stating the spot between his right fourth and fifth toes has returned and is sore again. States he had several weeks of relief and would like to get this fixed permanently   Review of Systems  All other systems reviewed and are negative.       Objective:   Physical Exam  Nursing note and vitals reviewed. Constitutional: He is oriented to person, place, and time.  Cardiovascular: Intact distal pulses.   Musculoskeletal: Normal range of motion.  Neurological: He is oriented to person, place, and time.  Skin: Skin is warm.   between the fourth and fifth toes right foot there is noted to be a painful keratotic lesion with abutment of the 2 toes noted. Also he mentioned his Coumadin stating that the level stays normal and that he does not bruise easily. 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 I have recommended partial soft tissue syndactylization procedure with arthroplasty of the fifth digit right foot. Patient wants surgery and read a consent form today for correction. Discussed all possible complications that can occur and the fact there is no long-term guarantees as far as success of surgery. Understands total recovery. We'll take 6 months to one year

## 2013-02-08 ENCOUNTER — Other Ambulatory Visit: Payer: Self-pay

## 2013-02-11 ENCOUNTER — Other Ambulatory Visit: Payer: Self-pay | Admitting: Interventional Cardiology

## 2013-02-14 ENCOUNTER — Ambulatory Visit (INDEPENDENT_AMBULATORY_CARE_PROVIDER_SITE_OTHER): Payer: 59 | Admitting: Pharmacist

## 2013-02-14 ENCOUNTER — Encounter: Payer: Self-pay | Admitting: Interventional Cardiology

## 2013-02-14 DIAGNOSIS — I059 Rheumatic mitral valve disease, unspecified: Secondary | ICD-10-CM

## 2013-02-14 DIAGNOSIS — Z954 Presence of other heart-valve replacement: Secondary | ICD-10-CM

## 2013-02-14 LAB — POCT INR: INR: 2.2

## 2013-03-05 ENCOUNTER — Ambulatory Visit (INDEPENDENT_AMBULATORY_CARE_PROVIDER_SITE_OTHER): Payer: 59 | Admitting: Pharmacist

## 2013-03-05 DIAGNOSIS — I059 Rheumatic mitral valve disease, unspecified: Secondary | ICD-10-CM

## 2013-03-05 DIAGNOSIS — Z954 Presence of other heart-valve replacement: Secondary | ICD-10-CM

## 2013-03-05 LAB — POCT INR: INR: 2.6

## 2013-04-02 ENCOUNTER — Ambulatory Visit (INDEPENDENT_AMBULATORY_CARE_PROVIDER_SITE_OTHER): Payer: 59

## 2013-04-02 DIAGNOSIS — Z954 Presence of other heart-valve replacement: Secondary | ICD-10-CM

## 2013-04-02 DIAGNOSIS — I059 Rheumatic mitral valve disease, unspecified: Secondary | ICD-10-CM

## 2013-04-02 LAB — POCT INR: INR: 1.5

## 2013-04-06 ENCOUNTER — Other Ambulatory Visit: Payer: Self-pay | Admitting: *Deleted

## 2013-04-06 DIAGNOSIS — I712 Thoracic aortic aneurysm, without rupture, unspecified: Secondary | ICD-10-CM

## 2013-04-13 ENCOUNTER — Ambulatory Visit: Payer: 59 | Attending: Family Medicine | Admitting: Physical Therapy

## 2013-04-13 DIAGNOSIS — R5381 Other malaise: Secondary | ICD-10-CM | POA: Insufficient documentation

## 2013-04-13 DIAGNOSIS — M542 Cervicalgia: Secondary | ICD-10-CM | POA: Insufficient documentation

## 2013-04-13 DIAGNOSIS — IMO0001 Reserved for inherently not codable concepts without codable children: Secondary | ICD-10-CM | POA: Insufficient documentation

## 2013-04-13 DIAGNOSIS — Z954 Presence of other heart-valve replacement: Secondary | ICD-10-CM | POA: Insufficient documentation

## 2013-04-16 ENCOUNTER — Ambulatory Visit (INDEPENDENT_AMBULATORY_CARE_PROVIDER_SITE_OTHER): Payer: 59 | Admitting: Pharmacist

## 2013-04-16 DIAGNOSIS — I059 Rheumatic mitral valve disease, unspecified: Secondary | ICD-10-CM

## 2013-04-16 DIAGNOSIS — Z954 Presence of other heart-valve replacement: Secondary | ICD-10-CM

## 2013-04-16 LAB — POCT INR: INR: 5.9

## 2013-04-17 ENCOUNTER — Ambulatory Visit: Payer: 59 | Admitting: Physical Therapy

## 2013-04-20 ENCOUNTER — Ambulatory Visit (INDEPENDENT_AMBULATORY_CARE_PROVIDER_SITE_OTHER): Payer: 59 | Admitting: *Deleted

## 2013-04-20 DIAGNOSIS — Z954 Presence of other heart-valve replacement: Secondary | ICD-10-CM

## 2013-04-20 DIAGNOSIS — I059 Rheumatic mitral valve disease, unspecified: Secondary | ICD-10-CM

## 2013-04-20 LAB — POCT INR: INR: 1.9

## 2013-04-23 ENCOUNTER — Ambulatory Visit: Payer: 59 | Admitting: Physical Therapy

## 2013-04-30 ENCOUNTER — Ambulatory Visit (INDEPENDENT_AMBULATORY_CARE_PROVIDER_SITE_OTHER): Payer: 59 | Admitting: *Deleted

## 2013-04-30 ENCOUNTER — Ambulatory Visit: Payer: 59 | Admitting: Physical Therapy

## 2013-04-30 DIAGNOSIS — I059 Rheumatic mitral valve disease, unspecified: Secondary | ICD-10-CM

## 2013-04-30 DIAGNOSIS — Z5181 Encounter for therapeutic drug level monitoring: Secondary | ICD-10-CM | POA: Insufficient documentation

## 2013-04-30 DIAGNOSIS — Z954 Presence of other heart-valve replacement: Secondary | ICD-10-CM

## 2013-04-30 LAB — POCT INR: INR: 3.6

## 2013-05-02 ENCOUNTER — Other Ambulatory Visit: Payer: 59

## 2013-05-02 ENCOUNTER — Ambulatory Visit: Payer: 59 | Admitting: Surgery

## 2013-05-07 ENCOUNTER — Ambulatory Visit: Payer: 59 | Attending: Family Medicine | Admitting: Physical Therapy

## 2013-05-07 DIAGNOSIS — M542 Cervicalgia: Secondary | ICD-10-CM | POA: Insufficient documentation

## 2013-05-07 DIAGNOSIS — R5381 Other malaise: Secondary | ICD-10-CM | POA: Insufficient documentation

## 2013-05-07 DIAGNOSIS — Z954 Presence of other heart-valve replacement: Secondary | ICD-10-CM | POA: Insufficient documentation

## 2013-05-07 DIAGNOSIS — IMO0001 Reserved for inherently not codable concepts without codable children: Secondary | ICD-10-CM | POA: Insufficient documentation

## 2013-05-14 ENCOUNTER — Ambulatory Visit: Payer: 59 | Admitting: Physical Therapy

## 2013-05-22 ENCOUNTER — Ambulatory Visit: Payer: 59 | Admitting: Interventional Cardiology

## 2013-05-23 ENCOUNTER — Ambulatory Visit (INDEPENDENT_AMBULATORY_CARE_PROVIDER_SITE_OTHER): Payer: 59 | Admitting: Surgery

## 2013-05-23 ENCOUNTER — Ambulatory Visit
Admission: RE | Admit: 2013-05-23 | Discharge: 2013-05-23 | Disposition: A | Discharge status: Home / Self Care | Payer: 59 | Source: Ambulatory Visit | Attending: Surgery | Admitting: Surgery

## 2013-05-23 ENCOUNTER — Encounter: Payer: Self-pay | Admitting: Surgery

## 2013-05-23 VITALS — BP 122/73 | HR 60 | Resp 19 | Ht 73.0 in | Wt 155.0 lb

## 2013-05-23 DIAGNOSIS — I712 Thoracic aortic aneurysm, without rupture, unspecified: Secondary | ICD-10-CM

## 2013-05-23 MED ORDER — GADOBENATE DIMEGLUMINE 529 MG/ML IV SOLN
14.0000 mL | Freq: Once | INTRAVENOUS | Status: AC | PRN
  Administered 2013-05-23: 14 mL via INTRAVENOUS

## 2013-05-23 NOTE — Progress Notes (Signed)
HPI:  The patient is a 63 year old gentleman who is well known to me from previous cardiac surgery. 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 This subsequently failed and was replaced with a St. Jude mechanical valve by Dr. Linus Mako in 1988. 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 He underwent repair of the periprosthetic mitral leak on 01/28/2005 through a right thoracotomy approach and the posterior periprosthetic leak was repaired with pledgeted horizontal mattress sutures. 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 I saw him again in October 2011 at which time he mentioned that he developed weakness and anemia in June 2011 and underwent a GI workup that did not show any signs of GI blood loss. He was seen by urology for workup of his dark urine but no cause of hematuria could be found. I last saw him in March of 2012 when he presented with a 6 week history of progressive dyspnea on exertion and lower extremity swelling and had a laboratory workup consistent with hemolysis. 2-D echocardiogram followed by TEE showed severe mitral regurgitation that was felt to be coming from a peri-valvular leak. CT scan of the chest, abdomen, and pelvis at that time showed no change in the size of his 5 cm aortic root aneurysm. He was subtotally referred to Dr. Evelina Dun at [facility] and underwent repeat right thoracotomy with mitral valve replacement using a new mechanical valve prosthesis. He said that he received blood products during that procedure and was in the  hospital for about 10 days postoperatively with a slow recovery. He was subsequently diagnosed with acute hepatitis C which was felt to be secondary to blood transfusion. He eventually recovered and is back at work. I saw him 1 year ago with an MRA of the chest and the aneurysm was unchanged at 4.9-5.0 cm. He returns today for routine followup of his aortic root aneurysm. He denies any chest pain or pressure. He is planning to start anti-viral treatment for Hepatitis C soon.   Current Outpatient Prescriptions  Medication Sig Dispense Refill  . acetaminophen (TYLENOL) 325 MG tablet Take 650 mg by mouth every 6 (six) hours as needed.      . ferrous sulfate 325 (65 FE) MG tablet Take 325 mg by mouth 2 (two) times daily.      Marland Kitchen warfarin (COUMADIN) 5 MG tablet TAKE 1 TABLET BY MOUTH ON TUESDAYS AND TAKE 1 AND 1/2 TABLETS BY MOUTH DAILY ON ALL OTHER DAYS  45 tablet  5   No current facility-administered medications for this visit.     Physical Exam: BP 122/73  Pulse 60  Resp 19  Ht 6\' 1"  (1.854 m)  Wt 155 lb (70.308 kg)  BMI 20.45 kg/m2  SpO2 95% He looks well.  Cardiac exam shows a regular rate and rhythm with a crisp mechanical valve click. There is a 1/6 systolic murmur at the apex.  His lung exam is clear.   There is no peripheral edema.   Diagnostic Tests:  CLINICAL DATA: Evaluate thoracic aortic aneurysm  EXAM:  MRA CHEST WITH CONTRAST  TECHNIQUE:  Angiographic images of the chest were obtained using MRA technique  without and with intravenous contrast.  CONTRAST: 57mL MULTIHANCE GADOBENATE DIMEGLUMINE 529 MG/ML IV SOLN  BUN and creatinine were obtained on site at [facility] at  [address]  Results: BUN 15 mg/dL, Creatinine 1.6 mg/dL.  COMPARISON: MR MRA CHEST WO/W CM dated 05/02/2012; CT CHEST W/CM  dated 08/07/2010; CT ABD-PELV WO/W CM (HEMATURIA) dated 01/12/2010; MR  MRA CHEST WO/W CM dated 01/20/2010  FINDINGS:  Post median sternotomy. Evaluation of the  ascending thoracic aorta  is minimally degraded secondary to susceptibility artifact from the  patient's median sternotomy wires.  No change to minimal increase in aneurysmal dilatation of the aortic  root at the levels of the sinuses of Valsalva with measurements as  follows.  Unchanged fusiform aneurysmal dilatation of the ascending thoracic  aorta with measurements as follows. The thoracic aorta tapers to a  normal caliber at the level of the aortic arch. Bovine configuration  of the aortic arch is again incidentally noted. The branch vessels  of the aortic arch are widely patent throughout their imaged course.  No definite thoracic aortic dissection or periaortic stranding.  Although this examination is not tailored for the evaluation of the  pulmonary arteries, there are no discrete filling defects within the  central pulmonary arterial tree to suggest central pulmonary  embolism. The caliber the main pulmonary artery is again noted to be  enlarged measuring approximately 4 cm in greatest oblique axial  dimension (image 21, series 11).  Cardiomegaly. Stable susceptibility artifact from prostatic mitral  valve replacement. No pericardial effusion.  No discrete focal airspace opacities. Interval resolution of  previously noted trace left-sided pleural effusion.  -------------------------------------------------------------  Thoracic aortic measurements:  Sinuses of Valsalva:  Degraded secondary to pulsation artifact. Minimally increased in  size in interval, currently measuring 53 mm in maximum oblique  sagittal diameter (image 25, series 6) previously, 47 mm; and  approximately 49 mm in greatest oblique axial dimension (image 30,  series 2), previously, 47 mm.  Sinotubular junction  41 mm as measured in greatest oblique coronal dimension, grossly  unchanged.  Proximal ascending aorta  41 mm as measured in greatest oblique axial dimension at the level  of the main pulmonary  artery (image 21, series 2), unchanged.  Aortic arch aorta  30 mm as measured in greatest oblique sagittal dimension.  Proximal descending thoracic aorta  26 mm as measured in greatest oblique axial dimension at the level  of the main pulmonary artery (image 21, series 2).  Distal descending thoracic aorta  25 mm as measured in greatest oblique sagittal dimension at the  level of the diaphragmatic hiatus (image 21, series 10).  Review of the MIP images confirms the above findings.  IMPRESSION:  1. Suspected minimal increase in aneurysmal dilatation of the aortic  root, now measuring approximate 53 mm in greatest diameter,  previously, 49 mm.  2. Grossly unchanged fusiform aneurysmal dilatation of the ascending  thoracic aorta measuring approximately 41 mm in diameter. No  evidence of thoracic aortic dissection.  Electronically Signed  By: Sandi Mariscal M.D.  On: 05/23/2013 12:29    Impression:  Radiology felt that there may be slight increase in aneurysmal dilatation of the aortic root. I think it looks about the same to me by my measurements at 5 cm. I have recommended continued follow-up in this complicated gentleman with 2 prior sternotomies and 2 prior  right thoracotomies for cardiac surgery. His BP is well-controlled.  Plan:  I will see him back in 1 year with an MRA of the chest.

## 2013-05-25 ENCOUNTER — Ambulatory Visit (INDEPENDENT_AMBULATORY_CARE_PROVIDER_SITE_OTHER): Payer: 59 | Admitting: *Deleted

## 2013-05-25 DIAGNOSIS — Z5181 Encounter for therapeutic drug level monitoring: Secondary | ICD-10-CM

## 2013-05-25 DIAGNOSIS — Z954 Presence of other heart-valve replacement: Secondary | ICD-10-CM

## 2013-05-25 DIAGNOSIS — I059 Rheumatic mitral valve disease, unspecified: Secondary | ICD-10-CM

## 2013-05-25 LAB — POCT INR: INR: 3.3

## 2013-06-05 ENCOUNTER — Ambulatory Visit (INDEPENDENT_AMBULATORY_CARE_PROVIDER_SITE_OTHER): Payer: 59 | Admitting: *Deleted

## 2013-06-05 ENCOUNTER — Ambulatory Visit (INDEPENDENT_AMBULATORY_CARE_PROVIDER_SITE_OTHER): Payer: 59 | Admitting: Interventional Cardiology

## 2013-06-05 ENCOUNTER — Encounter: Payer: Self-pay | Admitting: Interventional Cardiology

## 2013-06-05 VITALS — BP 110/54 | HR 59 | Ht 73.0 in | Wt 153.4 lb

## 2013-06-05 DIAGNOSIS — I059 Rheumatic mitral valve disease, unspecified: Secondary | ICD-10-CM

## 2013-06-05 DIAGNOSIS — B1920 Unspecified viral hepatitis C without hepatic coma: Secondary | ICD-10-CM

## 2013-06-05 DIAGNOSIS — Z5181 Encounter for therapeutic drug level monitoring: Secondary | ICD-10-CM

## 2013-06-05 DIAGNOSIS — I712 Thoracic aortic aneurysm, without rupture, unspecified: Secondary | ICD-10-CM

## 2013-06-05 DIAGNOSIS — I4892 Unspecified atrial flutter: Secondary | ICD-10-CM

## 2013-06-05 DIAGNOSIS — I5032 Chronic diastolic (congestive) heart failure: Secondary | ICD-10-CM

## 2013-06-05 DIAGNOSIS — Z954 Presence of other heart-valve replacement: Secondary | ICD-10-CM

## 2013-06-05 LAB — POCT INR: INR: 2.8

## 2013-06-05 NOTE — Progress Notes (Signed)
Patient ID: MORAN SIFUENTEZ, male   DOB: Jan 18, 1951, 63 y.o.   MRN: [id number] Past Medical History  Rheumatic heart disease with mitral valve repair/replacement x3, followed by cardiologist Dr. Tamala Julian. Most recently replaced at [facility] 09/01/10   Ascending aortic aneurysm, 4.7x4.8 cm followed by Dr. Cyndia Bent CVTS surgeon   Elevated blood pressures   Diplopia, dizziness Dr. Doy Mince 3/11, MRI/MRA ordered   ED, hypogonadism, BPH, hematuria followed by urologist Dr.Dahlstedt   adenomatous colon polyp history followed by gastroenterologist Dr. Michail Sermon, last colonoscopy August 03, 2007, repeat in 5 years   h/o hematuria 2006,2009 Dr.Dahlstedt Had Nl abdominal CT and cystoscopy in 11/2004   Chronic atrial fibrillation followed by cardiologist Dr. Tamala Julian   elevated CKs-myositis antibodies neg, +Hep C   Hepatitis C followed at [facility], felt secondary to transfusion in the 1970's, 2012 biopsy without cirrhosis      [address] [place], [place]  [address] Phone: [phone] Fax:  [phone]  Date:  06/05/2013   ID:  CHEVALIER KARNITZ, DOB 1950/07/15, MRN [id number]  PCP:  Gennette Pac, MD   ASSESSMENT:  1. Mechanical mitral valve, with history of paravalvular leak and hemolytic anemia. Stable without heart failure complaints 2. Chronic diastolic heart failure, stable 3. Chronic atrial flutter 4. Chronic anticoagulation therapy without current complications 5. Thoracic aortic aneurysm, followed by Dr. Cyndia Bent  PLAN:  1. Clinically stable and no change in medical regimen at this time 2. Continuing Coumadin clinic 3. Clinical followup with me in one year. Call earlier if symptoms of heart failure   SUBJECTIVE: SHAILEN UGALDE is a 63 y.o. male who is doing well. He denies exertional dyspnea. Worse from the swelling has resolved. There is no hematuria. He denies orthopnea, PND, episodes of syncope, and chest pain. His appetite is been stable. No significant weight loss.  Overall doing quite well. He is being treated for hepatitis C at [facility].   Wt Readings from Last 3 Encounters:  06/05/13 153 lb 6.4 oz (69.582 kg)  05/23/13 155 lb (70.308 kg)  01/08/13 155 lb (70.308 kg)     Past Medical History  Diagnosis Date  . Mitral valve disease   . Aneurysm of ascending aorta   . FHx: rheumatic heart disease   . Hypertension   . H/O diplopia   . BPH (benign prostatic hyperplasia)   . Hypogonadism male   . Colon polyps   . Hepatitis C   . Atrial fibrillation   . Allergy     Current Outpatient Prescriptions  Medication Sig Dispense Refill  . acetaminophen (TYLENOL) 325 MG tablet Take 650 mg by mouth every 6 (six) hours as needed.      . ferrous sulfate 325 (65 FE) MG tablet Take 325 mg by mouth 2 (two) times daily.      . traMADol (ULTRAM) 50 MG tablet Take 50 mg by mouth as needed.      . warfarin (COUMADIN) 5 MG tablet TAKE 1 TABLET BY MOUTH ON TUESDAYS AND TAKE 1 AND 1/2 TABLETS BY MOUTH DAILY ON ALL OTHER DAYS  45 tablet  5   No current facility-administered medications for this visit.    Allergies:    Allergies  Allergen Reactions  . Ampicillin Rash  . Penicillins Rash    Social History:  The patient  reports that he has been smoking Cigars.  He does not have any smokeless tobacco history on file. He reports that he does not drink alcohol  or use illicit drugs.   ROS:  Please see the history of present illness.   Denies Johns. Urine has been somewhat dark. No dysuria.   All other systems reviewed and negative.   OBJECTIVE: VS:  BP 110/54  Pulse 59  Ht 6\' 1"  (1.854 m)  Wt 153 lb 6.4 oz (69.582 kg)  BMI 20.24 kg/m2 Well nourished, well developed, in no acute distress, slender and appears than stated age HEENT: normal Neck: JVD distended to the angle of the jaw with a CV wave noted with patient lying at 45. Carotid bruit absent  Cardiac:  normal S1, S2; IIRR; holosystolic 2/6 murmur of mitral regurgitation Lungs:   clear to auscultation bilaterally, no wheezing, rhonchi or rales Abd: soft, nontender, no hepatomegaly Ext: Edema trace bilateral. Pulses 2+ and symmetric Skin: warm and dry Neuro:  CNs 2-12 intact, no focal abnormalities noted  EKG:  Atrial flutter with 4-1 AV conduction, prominent voltage, vertical axis, and incomplete right bundle branch block. No acute change compared to prior tracings.       Signed, Illene Labrador III, MD 06/05/2013 11:24 AM

## 2013-06-05 NOTE — Patient Instructions (Signed)
Your physician recommends that you continue on your current medications as directed. Please refer to the Current Medication list given to you today.  Your physician wants you to follow-up in: 1 year. You will receive a reminder letter in the mail two months in advance. If you don't receive a letter, please call our office to schedule the follow-up appointment.  

## 2013-06-25 ENCOUNTER — Ambulatory Visit (INDEPENDENT_AMBULATORY_CARE_PROVIDER_SITE_OTHER): Payer: 59 | Admitting: Pharmacist

## 2013-06-25 DIAGNOSIS — Z954 Presence of other heart-valve replacement: Secondary | ICD-10-CM

## 2013-06-25 DIAGNOSIS — Z5181 Encounter for therapeutic drug level monitoring: Secondary | ICD-10-CM

## 2013-06-25 DIAGNOSIS — I059 Rheumatic mitral valve disease, unspecified: Secondary | ICD-10-CM

## 2013-06-25 LAB — POCT INR: INR: 2.2

## 2013-07-16 ENCOUNTER — Ambulatory Visit (INDEPENDENT_AMBULATORY_CARE_PROVIDER_SITE_OTHER): Payer: 59 | Admitting: Pharmacist

## 2013-07-16 DIAGNOSIS — Z954 Presence of other heart-valve replacement: Secondary | ICD-10-CM

## 2013-07-16 DIAGNOSIS — Z5181 Encounter for therapeutic drug level monitoring: Secondary | ICD-10-CM

## 2013-07-16 DIAGNOSIS — I059 Rheumatic mitral valve disease, unspecified: Secondary | ICD-10-CM

## 2013-07-16 LAB — POCT INR: INR: 1.5

## 2013-07-26 ENCOUNTER — Ambulatory Visit (INDEPENDENT_AMBULATORY_CARE_PROVIDER_SITE_OTHER): Payer: 59 | Admitting: *Deleted

## 2013-07-26 DIAGNOSIS — Z5181 Encounter for therapeutic drug level monitoring: Secondary | ICD-10-CM

## 2013-07-26 DIAGNOSIS — I059 Rheumatic mitral valve disease, unspecified: Secondary | ICD-10-CM

## 2013-07-26 DIAGNOSIS — Z954 Presence of other heart-valve replacement: Secondary | ICD-10-CM

## 2013-07-26 LAB — POCT INR: INR: 2.5

## 2013-08-17 ENCOUNTER — Ambulatory Visit (INDEPENDENT_AMBULATORY_CARE_PROVIDER_SITE_OTHER): Payer: 59 | Admitting: Pharmacist

## 2013-08-17 DIAGNOSIS — I059 Rheumatic mitral valve disease, unspecified: Secondary | ICD-10-CM

## 2013-08-17 DIAGNOSIS — Z5181 Encounter for therapeutic drug level monitoring: Secondary | ICD-10-CM

## 2013-08-17 DIAGNOSIS — Z954 Presence of other heart-valve replacement: Secondary | ICD-10-CM

## 2013-08-17 LAB — POCT INR: INR: 2.4

## 2013-09-05 ENCOUNTER — Ambulatory Visit (INDEPENDENT_AMBULATORY_CARE_PROVIDER_SITE_OTHER): Payer: 59 | Admitting: *Deleted

## 2013-09-05 DIAGNOSIS — Z954 Presence of other heart-valve replacement: Secondary | ICD-10-CM

## 2013-09-05 DIAGNOSIS — I059 Rheumatic mitral valve disease, unspecified: Secondary | ICD-10-CM

## 2013-09-05 DIAGNOSIS — Z5181 Encounter for therapeutic drug level monitoring: Secondary | ICD-10-CM

## 2013-09-05 LAB — POCT INR: INR: 2.9

## 2013-09-05 MED ORDER — WARFARIN SODIUM 5 MG PO TABS: ORAL_TABLET | ORAL | Status: DC

## 2013-10-03 ENCOUNTER — Ambulatory Visit (INDEPENDENT_AMBULATORY_CARE_PROVIDER_SITE_OTHER): Payer: 59 | Admitting: Pharmacist

## 2013-10-03 DIAGNOSIS — I059 Rheumatic mitral valve disease, unspecified: Secondary | ICD-10-CM

## 2013-10-03 DIAGNOSIS — Z954 Presence of other heart-valve replacement: Secondary | ICD-10-CM

## 2013-10-03 DIAGNOSIS — Z5181 Encounter for therapeutic drug level monitoring: Secondary | ICD-10-CM

## 2013-10-03 LAB — POCT INR: INR: 4.7

## 2013-10-17 ENCOUNTER — Ambulatory Visit (INDEPENDENT_AMBULATORY_CARE_PROVIDER_SITE_OTHER): Payer: 59 | Admitting: Pharmacist

## 2013-10-17 DIAGNOSIS — Z954 Presence of other heart-valve replacement: Secondary | ICD-10-CM

## 2013-10-17 DIAGNOSIS — I059 Rheumatic mitral valve disease, unspecified: Secondary | ICD-10-CM

## 2013-10-17 DIAGNOSIS — Z5181 Encounter for therapeutic drug level monitoring: Secondary | ICD-10-CM

## 2013-10-17 LAB — POCT INR: INR: 3.5

## 2013-11-09 ENCOUNTER — Ambulatory Visit (INDEPENDENT_AMBULATORY_CARE_PROVIDER_SITE_OTHER): Payer: 59 | Admitting: Pharmacist

## 2013-11-09 DIAGNOSIS — Z954 Presence of other heart-valve replacement: Secondary | ICD-10-CM

## 2013-11-09 DIAGNOSIS — I059 Rheumatic mitral valve disease, unspecified: Secondary | ICD-10-CM

## 2013-11-09 DIAGNOSIS — Z5181 Encounter for therapeutic drug level monitoring: Secondary | ICD-10-CM

## 2013-11-09 LAB — POCT INR: INR: 4.7

## 2013-11-23 ENCOUNTER — Ambulatory Visit (INDEPENDENT_AMBULATORY_CARE_PROVIDER_SITE_OTHER): Payer: 59

## 2013-11-23 DIAGNOSIS — Z5181 Encounter for therapeutic drug level monitoring: Secondary | ICD-10-CM

## 2013-11-23 DIAGNOSIS — I059 Rheumatic mitral valve disease, unspecified: Secondary | ICD-10-CM

## 2013-11-23 DIAGNOSIS — Z954 Presence of other heart-valve replacement: Secondary | ICD-10-CM

## 2013-11-23 LAB — POCT INR: INR: 2.8

## 2013-12-21 ENCOUNTER — Ambulatory Visit (INDEPENDENT_AMBULATORY_CARE_PROVIDER_SITE_OTHER): Payer: 59 | Admitting: Pharmacist

## 2013-12-21 DIAGNOSIS — Z5181 Encounter for therapeutic drug level monitoring: Secondary | ICD-10-CM

## 2013-12-21 DIAGNOSIS — Z954 Presence of other heart-valve replacement: Secondary | ICD-10-CM

## 2013-12-21 DIAGNOSIS — I059 Rheumatic mitral valve disease, unspecified: Secondary | ICD-10-CM

## 2013-12-21 LAB — POCT INR: INR: 4

## 2014-01-03 ENCOUNTER — Ambulatory Visit (INDEPENDENT_AMBULATORY_CARE_PROVIDER_SITE_OTHER): Payer: 59

## 2014-01-03 DIAGNOSIS — Z5181 Encounter for therapeutic drug level monitoring: Secondary | ICD-10-CM

## 2014-01-03 DIAGNOSIS — I059 Rheumatic mitral valve disease, unspecified: Secondary | ICD-10-CM

## 2014-01-03 LAB — POCT INR: INR: 3.4

## 2014-02-01 ENCOUNTER — Ambulatory Visit (INDEPENDENT_AMBULATORY_CARE_PROVIDER_SITE_OTHER): Payer: 59 | Admitting: *Deleted

## 2014-02-01 ENCOUNTER — Other Ambulatory Visit: Payer: Self-pay | Admitting: Interventional Cardiology

## 2014-02-01 DIAGNOSIS — Z5181 Encounter for therapeutic drug level monitoring: Secondary | ICD-10-CM

## 2014-02-01 DIAGNOSIS — I059 Rheumatic mitral valve disease, unspecified: Secondary | ICD-10-CM

## 2014-02-01 LAB — POCT INR: INR: 4.6

## 2014-02-15 ENCOUNTER — Ambulatory Visit (INDEPENDENT_AMBULATORY_CARE_PROVIDER_SITE_OTHER): Payer: 59 | Admitting: *Deleted

## 2014-02-15 DIAGNOSIS — I059 Rheumatic mitral valve disease, unspecified: Secondary | ICD-10-CM

## 2014-02-15 DIAGNOSIS — Z23 Encounter for immunization: Secondary | ICD-10-CM

## 2014-02-15 DIAGNOSIS — Z5181 Encounter for therapeutic drug level monitoring: Secondary | ICD-10-CM

## 2014-02-15 LAB — POCT INR: INR: 2.5

## 2014-03-13 ENCOUNTER — Ambulatory Visit (INDEPENDENT_AMBULATORY_CARE_PROVIDER_SITE_OTHER): Payer: 59 | Admitting: *Deleted

## 2014-03-13 DIAGNOSIS — Z5181 Encounter for therapeutic drug level monitoring: Secondary | ICD-10-CM

## 2014-03-13 DIAGNOSIS — I059 Rheumatic mitral valve disease, unspecified: Secondary | ICD-10-CM

## 2014-03-13 LAB — POCT INR: INR: 4.1

## 2014-03-27 ENCOUNTER — Ambulatory Visit (INDEPENDENT_AMBULATORY_CARE_PROVIDER_SITE_OTHER): Payer: 59 | Admitting: *Deleted

## 2014-03-27 DIAGNOSIS — Z5181 Encounter for therapeutic drug level monitoring: Secondary | ICD-10-CM

## 2014-03-27 DIAGNOSIS — I059 Rheumatic mitral valve disease, unspecified: Secondary | ICD-10-CM

## 2014-03-27 LAB — POCT INR: INR: 3.8

## 2014-04-08 ENCOUNTER — Other Ambulatory Visit: Payer: Self-pay | Admitting: *Deleted

## 2014-04-08 DIAGNOSIS — I712 Thoracic aortic aneurysm, without rupture, unspecified: Secondary | ICD-10-CM

## 2014-04-12 ENCOUNTER — Ambulatory Visit (INDEPENDENT_AMBULATORY_CARE_PROVIDER_SITE_OTHER): Payer: 59 | Admitting: *Deleted

## 2014-04-12 DIAGNOSIS — I059 Rheumatic mitral valve disease, unspecified: Secondary | ICD-10-CM

## 2014-04-12 DIAGNOSIS — Z5181 Encounter for therapeutic drug level monitoring: Secondary | ICD-10-CM

## 2014-04-12 LAB — POCT INR: INR: 2.6

## 2014-05-01 ENCOUNTER — Ambulatory Visit (INDEPENDENT_AMBULATORY_CARE_PROVIDER_SITE_OTHER): Payer: 59 | Admitting: *Deleted

## 2014-05-01 ENCOUNTER — Telehealth: Payer: Self-pay

## 2014-05-01 ENCOUNTER — Other Ambulatory Visit: Payer: 59

## 2014-05-01 VITALS — Wt 152.0 lb

## 2014-05-01 DIAGNOSIS — Z5181 Encounter for therapeutic drug level monitoring: Secondary | ICD-10-CM

## 2014-05-01 DIAGNOSIS — I059 Rheumatic mitral valve disease, unspecified: Secondary | ICD-10-CM

## 2014-05-01 LAB — BASIC METABOLIC PANEL
BUN: 16 mg/dL (ref 6–23)
CO2: 29 mEq/L (ref 19–32)
CREATININE: 1.45 mg/dL (ref 0.40–1.50)
Calcium: 9.3 mg/dL (ref 8.4–10.5)
Chloride: 108 mEq/L (ref 96–112)
GFR: 63.44 mL/min (ref >60.00)
Glucose, Bld: 87 mg/dL (ref 70–99)
Potassium: 4.2 mEq/L (ref 3.5–5.1)
Sodium: 138 mEq/L (ref 135–145)

## 2014-05-01 LAB — POCT INR: INR: 3.2

## 2014-05-01 NOTE — Patient Instructions (Addendum)
05/01/14- Take No Coumadin                                                             05/02/14- Do No Coumadin or no Lovenox injections  05/03/14- Start Lovenox 60 mg injection into fatty abdominal tissue 2 inches away from navel at 9am and 9pm  05/04/14- Continue Lovenox 60mg s injections into fatty abdominal tissue 2 inches away from navel, rotate sites at 9am and 9pm  05/05/14- Continue Lovenox 60mg s injections into fatty abdominal tissue 2 inches away from navel, rotate sites at 9am and 9pm  05/06/14-   Continue Lovenox 60mg s injections into fatty abdominal tissue 2 inches away from navel, rotate sites at 9am and 9pm  05/07/14-  Take only 1 Lovenox 60mg s injection into fatty abdominal tissue 2 inches away from navel at 9am. No not take 9 pm injection  05/08/14-  Day of procedure-do no Lovenox injections.   After procedure when ok with Dr Michail Sermon restart Coumadin and Lovenox. When you restart Coumadin take an extra 1/2 tablet for 2 days along with your normal dosage, then resume regular dosage. Also resume Lovenox 60mg s injections every 12 hours apart.  Coumadin # [id number]  Main#  [id number]

## 2014-05-01 NOTE — Telephone Encounter (Signed)
Pt sts that he was called yesterday and was told to come in today for an appt with [facility] to begin lovenox bridging for his procedure wit [facility] on 2/3. Pt was on his way here, scheduled pt on coumadin schedule for today.

## 2014-05-01 NOTE — Telephone Encounter (Signed)
Lmtcb. We received cardiac clearance request for [facility]. Pt is scheduled for a endoscopy on 05/08/14, per Dr.Smith pt coumadin will need to be bridged with lovenox. Pt needs an appt with [facility] on 1/28 to begin process in time.

## 2014-05-07 ENCOUNTER — Other Ambulatory Visit: Payer: Self-pay | Admitting: Gastroenterology

## 2014-05-07 NOTE — Addendum Note (Signed)
Addended by: Wilford Corner on: 05/07/2014 03:25 PM   Modules accepted: Orders

## 2014-05-08 ENCOUNTER — Ambulatory Visit (HOSPITAL_COMMUNITY): Payer: 59 | Admitting: Certified Registered Nurse Anesthetist

## 2014-05-08 ENCOUNTER — Encounter (HOSPITAL_COMMUNITY): Payer: Self-pay | Admitting: *Deleted

## 2014-05-08 ENCOUNTER — Ambulatory Visit (HOSPITAL_COMMUNITY)
Admission: RE | Admit: 2014-05-08 | Discharge: 2014-05-08 | Disposition: A | Discharge status: Home / Self Care | Payer: 59 | Source: Ambulatory Visit | Attending: Gastroenterology | Admitting: Gastroenterology

## 2014-05-08 ENCOUNTER — Encounter (HOSPITAL_COMMUNITY)
Admission: RE | Disposition: A | Discharge status: Home / Self Care | Payer: Self-pay | Source: Ambulatory Visit | Attending: Gastroenterology

## 2014-05-08 DIAGNOSIS — Z7901 Long term (current) use of anticoagulants: Secondary | ICD-10-CM | POA: Diagnosis not present

## 2014-05-08 DIAGNOSIS — K2960 Other gastritis without bleeding: Secondary | ICD-10-CM | POA: Insufficient documentation

## 2014-05-08 DIAGNOSIS — K7460 Unspecified cirrhosis of liver: Secondary | ICD-10-CM | POA: Diagnosis present

## 2014-05-08 DIAGNOSIS — F17211 Nicotine dependence, cigarettes, in remission: Secondary | ICD-10-CM | POA: Diagnosis not present

## 2014-05-08 DIAGNOSIS — B1920 Unspecified viral hepatitis C without hepatic coma: Secondary | ICD-10-CM | POA: Diagnosis not present

## 2014-05-08 DIAGNOSIS — I10 Essential (primary) hypertension: Secondary | ICD-10-CM | POA: Diagnosis not present

## 2014-05-08 HISTORY — PX: ESOPHAGOGASTRODUODENOSCOPY (EGD) WITH PROPOFOL: SHX5813

## 2014-05-08 SURGERY — ESOPHAGOGASTRODUODENOSCOPY (EGD) WITH PROPOFOL
Anesthesia: Monitor Anesthesia Care

## 2014-05-08 MED ORDER — SODIUM CHLORIDE 0.9 % IV SOLN: INTRAVENOUS | Status: DC

## 2014-05-08 MED ORDER — BUTAMBEN-TETRACAINE-BENZOCAINE 2-2-14 % EX AERO
INHALATION_SPRAY | CUTANEOUS | Status: DC | PRN
  Administered 2014-05-08: 2 via TOPICAL

## 2014-05-08 MED ORDER — LIDOCAINE HCL (CARDIAC) 20 MG/ML IV SOLN
INTRAVENOUS | Status: DC | PRN
  Administered 2014-05-08: 40 mg via INTRAVENOUS

## 2014-05-08 MED ORDER — LACTATED RINGERS IV SOLN
INTRAVENOUS | Status: DC
  Administered 2014-05-08: 1000 mL via INTRAVENOUS

## 2014-05-08 MED ORDER — PROPOFOL 10 MG/ML IV BOLUS
INTRAVENOUS | Status: DC | PRN
  Administered 2014-05-08: 20 ug via INTRAVENOUS
  Administered 2014-05-08: 50 ug via INTRAVENOUS

## 2014-05-08 NOTE — Interval H&P Note (Signed)
History and Physical Interval Note:  05/08/2014 12:14 PM  [name]  has presented today for surgery, with the diagnosis of cirrosis  The various methods of treatment have been discussed with the patient and family. After consideration of risks, benefits and other options for treatment, the patient has consented to  Procedure(s): ESOPHAGOGASTRODUODENOSCOPY (EGD) WITH PROPOFOL (N/A) as a surgical intervention .  The patient's history has been reviewed, patient examined, no change in status, stable for surgery.  I have reviewed the patient's chart and labs.  Questions were answered to the patient's satisfaction.     Osgood C.

## 2014-05-08 NOTE — H&P (Signed)
  Date of Initial H&P: 04/19/14  History reviewed, patient examined, no change in status, stable for surgery.

## 2014-05-08 NOTE — Anesthesia Preprocedure Evaluation (Addendum)
Anesthesia Evaluation  Patient identified by MRN, date of birth, ID band Patient awake    Reviewed: Allergy & Precautions, NPO status , Patient's Chart, lab work & pertinent test results, reviewed documented beta blocker date and time   Airway Mallampati: II   Neck ROM: Full    Dental  (+) Teeth Intact, Dental Advisory Given   Pulmonary Current Smoker,  breath sounds clear to auscultation        Cardiovascular hypertension, Rhythm:Regular  SP MVR x2, thoracic aorta aneurysm stable and being followed by [facility]   Neuro/Psych    GI/Hepatic (+) Hepatitis -, CHEP C from transfusion   Endo/Other  negative endocrine ROS  Renal/GU negative Renal ROS     Musculoskeletal   Abdominal (+)  Abdomen: soft.    Peds  Hematology   Anesthesia Other Findings   Reproductive/Obstetrics                           Anesthesia Physical Anesthesia Plan  ASA: III  Anesthesia Plan: MAC   Post-op Pain Management:    Induction:   Airway Management Planned: Nasal Cannula  Additional Equipment:   Intra-op Plan:   Post-operative Plan:   Informed Consent: I have reviewed the patients History and Physical, chart, labs and discussed the procedure including the risks, benefits and alternatives for the proposed anesthesia with the patient or authorized representative who has indicated his/her understanding and acceptance.     Plan Discussed with:   Anesthesia Plan Comments:         Anesthesia Quick Evaluation

## 2014-05-08 NOTE — Anesthesia Postprocedure Evaluation (Signed)
  Anesthesia Post-op Note  Patient: [name]  Procedure(s) Performed: Procedure(s): ESOPHAGOGASTRODUODENOSCOPY (EGD) WITH PROPOFOL (N/A)  Patient Location: PACU  Anesthesia Type:MAC  Level of Consciousness: awake, alert  and oriented  Airway and Oxygen Therapy: Patient Spontanous Breathing and Patient connected to nasal cannula oxygen  Post-op Pain: none  Post-op Assessment: Post-op Vital signs reviewed, Patient's Cardiovascular Status Stable, Respiratory Function Stable, Patent Airway and No signs of Nausea or vomiting  Post-op Vital Signs: Reviewed and stable  Last Vitals:  Filed Vitals:   05/08/14 1325  BP: 118/63  Pulse: 56  Temp:   Resp: 20    Complications: No apparent anesthesia complications

## 2014-05-08 NOTE — Discharge Instructions (Addendum)
Resume Coumadin (Warfarin) at previously recommended dose. Start taking Prilosec OTC 20 mg once a day for the next 2 weeks (Take 30 minutes before your first meal each morning).Esophagogastroduodenoscopy Care After Refer to this sheet in the next few weeks. These instructions provide you with information on caring for yourself after your procedure. Your caregiver may also give you more specific instructions. Your treatment has been planned according to current medical practices, but problems sometimes occur. Call your caregiver if you have any problems or questions after your procedure.  HOME CARE INSTRUCTIONS  Do not eat or drink anything until the numbing medicine (local anesthetic) has worn off and your gag reflex has returned. You will know that the local anesthetic has worn off when you can swallow comfortably.  Do not drive for 24 hours after the procedure or as directed by your caregiver.  Only take medicines as directed by your caregiver. SEEK MEDICAL CARE IF:   You cannot stop coughing.  You are not urinating at all or less than usual. SEEK IMMEDIATE MEDICAL CARE IF:  You have difficulty swallowing.  You cannot eat or drink.  You have worsening throat or chest pain.  You have dizziness, lightheadedness, or you faint.  You have nausea or vomiting.  You have chills.  You have a fever.  You have severe abdominal pain.  You have black, tarry, or bloody stools. Document Released: 03/08/2012 Document Reviewed: 03/08/2012 [facility] Patient Information 2015 [place]. This information is not intended to replace advice given to you by your health care provider. Make sure you discuss any questions you have with your health care provider.

## 2014-05-08 NOTE — Transfer of Care (Signed)
Immediate Anesthesia Transfer of Care Note  Patient: [name]  Procedure(s) Performed: Procedure(s): ESOPHAGOGASTRODUODENOSCOPY (EGD) WITH PROPOFOL (N/A)  Patient Location: Endoscopy Unit  Anesthesia Type:MAC  Level of Consciousness: awake, alert  and oriented  Airway & Oxygen Therapy: Patient Spontanous Breathing and Patient connected to nasal cannula oxygen  Post-op Assessment: Report given to RN and Post -op Vital signs reviewed and stable  Post vital signs: Reviewed and stable  Last Vitals:  Filed Vitals:   05/08/14 1135  BP: 137/68  Temp: 36.4 C  Resp: 14    Complications: No apparent anesthesia complications

## 2014-05-08 NOTE — Op Note (Signed)
[facility] [place], [address]   ENDOSCOPY PROCEDURE REPORT  PATIENT: [name], [name]  MR#: [id number] BIRTHDATE: Jun 03, 1950 , 63  yrs. old GENDER: male ENDOSCOPIST: Wilford Corner, MD REFERRED BY: PROCEDURE DATE:  2014-06-02 PROCEDURE:  EGD, diagnostic ASA CLASS:     Class III INDICATIONS:  cirrhosis. MEDICATIONS: Monitored anesthesia care and Per Anesthesia TOPICAL ANESTHETIC: Cetacaine Spray  DESCRIPTION OF PROCEDURE: After the risks benefits and alternatives of the procedure were thoroughly explained, informed consent was obtained.  The Pentax Gastroscope [id number] endoscope was introduced through the mouth and advanced to the second portion of the duodenum , Without limitations.  The instrument was slowly withdrawn as the mucosa was fully examined.    Esophagus normal in appearance without any varices noted. GEJ normal in appearance. Scattered areas of erythema and edema in the antrum consistent with antral gastritis. Stomach otherwise normal in appearance. Duodenal bulb and 2nd portion of the duodenum normal in appearance.       Retroflexed views revealed no abnormalities. The scope was then withdrawn from the patient and the procedure completed.  COMPLICATIONS: There were no immediate complications.  ENDOSCOPIC IMPRESSION:     Antral gastritis otherwise normal EGD  RECOMMENDATIONS:     Repeat EGD in 3 years   eSigned:  Wilford Corner, MD 2014-06-02 1:23 PM    CC:  CPT CODES: ICD CODES:  The ICD and CPT codes recommended by this software are interpretations from the data that the clinical staff has captured with the software.  The verification of the translation of this report to the ICD and CPT codes and modifiers is the sole responsibility of the health care institution and practicing physician where this report was generated.  [employer] will not be held responsible for the validity of the  ICD and CPT codes included on this report.  AMA assumes no liability for data contained or not contained herein. CPT is a Designer, television/film set of the [employer].

## 2014-05-09 ENCOUNTER — Encounter (HOSPITAL_COMMUNITY): Payer: Self-pay | Admitting: Gastroenterology

## 2014-05-10 ENCOUNTER — Ambulatory Visit (INDEPENDENT_AMBULATORY_CARE_PROVIDER_SITE_OTHER): Payer: 59 | Admitting: *Deleted

## 2014-05-10 DIAGNOSIS — Z5181 Encounter for therapeutic drug level monitoring: Secondary | ICD-10-CM

## 2014-05-10 DIAGNOSIS — I059 Rheumatic mitral valve disease, unspecified: Secondary | ICD-10-CM

## 2014-05-10 LAB — POCT INR: INR: 1.1

## 2014-05-14 ENCOUNTER — Ambulatory Visit (INDEPENDENT_AMBULATORY_CARE_PROVIDER_SITE_OTHER): Payer: 59 | Admitting: *Deleted

## 2014-05-14 DIAGNOSIS — Z5181 Encounter for therapeutic drug level monitoring: Secondary | ICD-10-CM

## 2014-05-14 DIAGNOSIS — I059 Rheumatic mitral valve disease, unspecified: Secondary | ICD-10-CM

## 2014-05-14 LAB — POCT INR: INR: 1.9

## 2014-05-22 ENCOUNTER — Ambulatory Visit
Admission: RE | Admit: 2014-05-22 | Discharge: 2014-05-22 | Disposition: A | Discharge status: Home / Self Care | Payer: 59 | Source: Ambulatory Visit | Attending: Surgery | Admitting: Surgery

## 2014-05-22 ENCOUNTER — Ambulatory Visit (INDEPENDENT_AMBULATORY_CARE_PROVIDER_SITE_OTHER): Payer: 59 | Admitting: Surgery

## 2014-05-22 ENCOUNTER — Encounter: Payer: Self-pay | Admitting: Surgery

## 2014-05-22 VITALS — BP 130/82 | HR 86 | Resp 20 | Ht 73.0 in | Wt 152.0 lb

## 2014-05-22 DIAGNOSIS — I712 Thoracic aortic aneurysm, without rupture, unspecified: Secondary | ICD-10-CM

## 2014-05-22 MED ORDER — GADOBENATE DIMEGLUMINE 529 MG/ML IV SOLN
15.0000 mL | Freq: Once | INTRAVENOUS | Status: AC | PRN
  Administered 2014-05-22: 15 mL via INTRAVENOUS

## 2014-05-23 ENCOUNTER — Encounter: Payer: Self-pay | Admitting: Surgery

## 2014-05-23 NOTE — Progress Notes (Signed)
HPI:  The patient is a 64 year old gentleman who is well known to me from previous cardiac surgery. 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 This subsequently failed and was replaced with a [employer]. Jude mechanical valve by Dr. Linus Mako in 1988. 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 He underwent repair of the periprosthetic mitral leak on 01/28/2005 through a right thoracotomy approach and the posterior periprosthetic leak was repaired with pledgeted horizontal mattress sutures. 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 I saw him again in October 2011 at which time he mentioned that he developed weakness and anemia in June 2011 and underwent a GI workup that did not show any signs of GI blood loss. He was seen by urology for workup of his dark urine but no cause of hematuria could be found. I last saw him in March of 2012 when he presented with a 6 week history of progressive dyspnea on exertion and lower extremity swelling and had a laboratory workup consistent with hemolysis. 2-D echocardiogram followed by TEE showed severe mitral regurgitation that was felt to be coming from a peri-valvular leak. CT scan of the chest, abdomen, and pelvis at that time showed no change in the size of his 5 cm aortic root aneurysm. He was subtotally referred to Dr. Evelina Dun at [facility] and underwent repeat right thoracotomy with mitral valve replacement using a new mechanical valve prosthesis. He said that he received blood products during that procedure and was in the  hospital for about 10 days postoperatively with a slow recovery. He was subsequently diagnosed with acute hepatitis C which was felt to be secondary to blood transfusion. He eventually recovered and is back at work. I saw him 1 year ago with an MRA of the chest and the aneurysm was unchanged at 4.9-5.0 cm. He returns today for routine followup of his aortic root aneurysm. He denies any chest pain or pressure. He completed anti-viral treatment for Hepatitis C. He had noted intermittent dark urine but says that since he started taking Cialis daily he has not had any dark urine. He continues to work full time.  Current Outpatient Prescriptions  Medication Sig Dispense Refill  . acetaminophen (TYLENOL) 500 MG tablet Take 1,000 mg by mouth every 6 (six) hours as needed (pain).    . ferrous sulfate 325 (65 FE) MG tablet Take 325 mg by mouth every other day.     . Multiple Vitamin (MULTIVITAMIN WITH MINERALS) TABS tablet Take 1 tablet by mouth daily.    . tadalafil (CIALIS) 5 MG tablet Take 5 mg by mouth daily as needed for erectile dysfunction.    . Testosterone 20.25 MG/1.25GM (1.62%) GEL Place 40.5 mg onto the skin daily. Androgel (2 pumps)    . warfarin (COUMADIN) 5 MG tablet Take 2.5-5 mg by mouth daily. Take 1/2 tablet (2.5 mg) on Tuesday, Thursday, Saturday, take 1 tablet (5 mg) on Sunday, Monday, Wednesday, Friday     No current facility-administered medications for this visit.     Physical  Exam: BP 130/82 mmHg  Pulse 86  Resp 20  Ht 6\' 1"  (1.854 m)  Wt 152 lb (68.947 kg)  BMI 20.06 kg/m2  SpO2 98% He looks well.  Cardiac exam shows a regular rate and rhythm with a crisp mechanical valve click. There is a 1/6 systolic murmur at the apex which was present when I saw him last year. His lung exam is clear.  There is no peripheral edema.   Diagnostic Tests:  CLINICAL DATA: Follow-up thoracic aortic aneurysm  EXAM: MRA CHEST WITH OR WITHOUT CONTRAST  TECHNIQUE: Angiographic  images of the chest were obtained using MRA technique without and with intravenous contrast.  CONTRAST: 18mL MULTIHANCE GADOBENATE DIMEGLUMINE 529 MG/ML IV SOLN  COMPARISON: Chest MRA - 05/23/2013; 05/02/2012  FINDINGS: Vascular Findings:  The quality of today's reformatted sagittal images is markedly improved in comparison to the 05/2013 examination.  Unchanged fusiform dilatation of the aortic root measuring approximately 51 mm in diameter. Unchanged mild fusiform aneurysmal dilatation of the ascending thoracic aorta, measuring approximately 40 mm in diameter. The thoracic aorta tapers to a normal caliber at the level of the aortic arch. No definite thoracic aortic dissection or periaortic stranding on this nongated examination.  Bovine configuration of the aortic arch. The branch vessels of the aortic arch are remain widely patent throughout their imaged course.  Post median sternotomy. Post mitral valve repair. Cardiomegaly. Trace amount of pericardial fluid, likely physiologic.  Unchanged enlargement of the caliber the main pulmonary artery measuring 41 mm in diameter. Although this examination was not tailored for the evaluation of the pulmonary arteries, there are no discrete filling defects within the central pulmonary arterial tree to suggest central pulmonary embolism.  Interval development of trace bilateral pleural effusions.  -------------------------------------------------------------  Thoracic aortic measurements:  Aortic root:  51 mm in greatest oblique sagittal dimension (image 52, series 10), grossly unchanged  Sinotubular junction  38 mm as measured in greatest oblique sagittal dimension (image 52, series 10), grossly unchanged.  Proximal ascending aorta  40 mm as measured in greatest oblique sagittal dimension at the level of the main pulmonary artery (image 50, series 10), unchanged  Aortic arch aorta  28 mm as measured  in greatest oblique sagittal dimension (image 55, series 10), unchanged.  Proximal descending thoracic aorta  25 mm as measured in greatest oblique axial dimension at the level of the main pulmonary artery, unchanged.  Distal descending thoracic aorta  24 mm as measured in greatest oblique sagittal dimension at the level of the diaphragmatic hiatus.  IMPRESSION: 1. Unchanged mild fusiform aneurysmal dilatation of the aortic root (measuring 51 mm) and proximal ascending thoracic aorta (measuring approximately 40 mm). No evidence of thoracic aortic dissection or periaortic stranding. 2. Post median sternotomy and mitral valve repair. 3. Cardiomegaly with persistent enlargement of the caliber of the main pulmonary artery, nonspecific though could be seen in the setting of pulmonary arterial hypertension. Further evaluation with cardiac echo could be performed as clinically indicated. 4. Interval development trace bilateral pleural effusions.   Electronically Signed  By: Sandi Mariscal M.D.  On: 05/22/2014 14:15   Impression:  The aortic root aneurysm is unchanged at about 5.1 cm with unchanged fusiform aneurysmal dilation of the ascending aorta of 4.0 cm. This has been stable since I began following him in 2006. He is doing well otherwise. I have been following this yearly due to its 5 cm size but he is concerned about the cost to him of doing an MRA every year,  which he says is about $1000. Since it has been stable for 10 years I think it is reasonable to wait 2 years to do a follow up scan as long as he is doing well. He is going to see Dr. Tamala Julian next month for cardiology follow up.  Plan:  I will see him in 2 years with an MRA of the chest.

## 2014-05-31 ENCOUNTER — Ambulatory Visit (INDEPENDENT_AMBULATORY_CARE_PROVIDER_SITE_OTHER): Payer: 59

## 2014-05-31 DIAGNOSIS — I059 Rheumatic mitral valve disease, unspecified: Secondary | ICD-10-CM

## 2014-05-31 DIAGNOSIS — Z5181 Encounter for therapeutic drug level monitoring: Secondary | ICD-10-CM

## 2014-05-31 LAB — POCT INR: INR: 2.6

## 2014-06-06 ENCOUNTER — Encounter: Payer: Self-pay | Admitting: Interventional Cardiology

## 2014-06-06 ENCOUNTER — Ambulatory Visit (INDEPENDENT_AMBULATORY_CARE_PROVIDER_SITE_OTHER): Payer: 59 | Admitting: Interventional Cardiology

## 2014-06-06 VITALS — BP 122/62 | HR 58 | Ht 73.0 in | Wt 152.0 lb

## 2014-06-06 DIAGNOSIS — I059 Rheumatic mitral valve disease, unspecified: Secondary | ICD-10-CM

## 2014-06-06 DIAGNOSIS — I712 Thoracic aortic aneurysm, without rupture, unspecified: Secondary | ICD-10-CM

## 2014-06-06 DIAGNOSIS — Z5181 Encounter for therapeutic drug level monitoring: Secondary | ICD-10-CM

## 2014-06-06 DIAGNOSIS — I5032 Chronic diastolic (congestive) heart failure: Secondary | ICD-10-CM

## 2014-06-06 DIAGNOSIS — I4892 Unspecified atrial flutter: Secondary | ICD-10-CM

## 2014-06-06 NOTE — Patient Instructions (Signed)
Your physician recommends that you continue on your current medications as directed. Please refer to the Current Medication list given to you today.  Your physician wants you to follow-up in: 1 year with Dr.Smith You will receive a reminder letter in the mail two months in advance. If you don't receive a letter, please call our office to schedule the follow-up appointment.  

## 2014-06-06 NOTE — Progress Notes (Signed)
Cardiology Office Note   Date:  06/06/2014   ID:  [name], DOB [birth year], MRN [id number]  PCP:  Gennette Pac, MD  Cardiologist:   Sinclair Grooms, MD   No chief complaint on file.     History of Present Illness: [name] is a 64 y.o. male who presents for follow-up of mitral valve disease and chronic atrial flutter. He has no cardiopulmonary complaints. His been no lower extremity edema. He denies syncope and palpitations. No transient neurological complaints.    Past Medical History  Diagnosis Date  . Mitral valve disease   . Aneurysm of ascending aorta   . FHx: rheumatic heart disease   . Hypertension   . H/O diplopia   . BPH (benign prostatic hyperplasia)   . Hypogonadism male   . Colon polyps   . Hepatitis C   . Atrial fibrillation   . Allergy     Past Surgical History  Procedure Laterality Date  . Mitral valve replacement    . Colonoscopy    . Colonoscopy N/A 11/14/2012    Procedure: COLONOSCOPY;  Surgeon: Lear Ng, MD;  Location: [facility];  Service: Endoscopy;  Laterality: N/A;  . Colon surgery    . Esophagogastroduodenoscopy (egd) with propofol N/A 05/08/2014    Procedure: ESOPHAGOGASTRODUODENOSCOPY (EGD) WITH PROPOFOL;  Surgeon: Lear Ng, MD;  Location: [facility];  Service: Endoscopy;  Laterality: N/A;     Current Outpatient Prescriptions  Medication Sig Dispense Refill  . acetaminophen (TYLENOL) 500 MG tablet Take 1,000 mg by mouth every 6 (six) hours as needed (pain).    . ferrous sulfate 325 (65 FE) MG tablet Take 325 mg by mouth every other day.     . Multiple Vitamin (MULTIVITAMIN WITH MINERALS) TABS tablet Take 1 tablet by mouth daily.    . tadalafil (CIALIS) 5 MG tablet Take 5 mg by mouth daily as needed for erectile dysfunction.    . Testosterone 20.25 MG/1.25GM (1.62%) GEL Place 40.5 mg onto the skin daily. Androgel (2 pumps)    . warfarin (COUMADIN) 5 MG tablet Take 2.5-5 mg by mouth daily. Take  1/2 tablet (2.5 mg) on Tuesday, Thursday, Saturday, take 1 tablet (5 mg) on Sunday, Monday, Wednesday, Friday     No current facility-administered medications for this visit.    Allergies:   Ampicillin and Penicillins    Social History:  The patient  reports that he has been smoking Cigars.  He does not have any smokeless tobacco history on file. He reports that he does not drink alcohol or use illicit drugs.   Family History:  The patient's Family history is unknown by patient.    ROS:  Please see the history of present illness.   Otherwise, review of systems are positive for fluctuating diet and varying weight. No chills or fever..   All other systems are reviewed and negative.    PHYSICAL EXAM: VS:  BP 122/62 mmHg  Pulse 58  Ht 6\' 1"  (1.854 m)  Wt 152 lb (68.947 kg)  BMI 20.06 kg/m2 , BMI Body mass index is 20.06 kg/(m^2). GEN: Well nourished, well developed, in no acute distress HEENT: normal Neck: no JVD, carotid bruits, or masses Cardiac: Mechanical valve closure sounds are heard in the mitral position. No murmurs heard. RRR;, rubs, or gallops,no edema  Respiratory:  clear to auscultation bilaterally, normal work of breathing GI: soft, nontender, nondistended, + BS MS: no deformity or atrophy Skin: warm and dry, no rash  Neuro:  Strength and sensation are intact Psych: euthymic mood, full affect   EKG:  EKG is ordered today. The ekg ordered today demonstrates atrial flutter with variable ventricular response   Recent Labs: 05/01/2014: BUN 16; Creatinine 1.45; Potassium 4.2; Sodium 138    Lipid Panel No results found for: CHOL, TRIG, HDL, CHOLHDL, VLDL, LDLCALC, LDLDIRECT    Wt Readings from Last 3 Encounters:  06/06/14 152 lb (68.947 kg)  05/22/14 152 lb (68.947 kg)  05/01/14 152 lb (68.947 kg)      Other studies Reviewed: Additional studies/ records that were reviewed today include: From [facility] Review of the above records demonstrates: Problem list  including a history of rheumatic heart disease, mitral valve replacement 3, ascending aortic aneurysm 4.8 cm, followed by Dr. Cyndia Bent. Hepatitis C.   ASSESSMENT AND PLAN:  Chronic atrial flutter: Excellent rate control  Mitral valve disorder: Status post mitral valve replacement 3. No evidence of dysfunction based on auscultation.  Chronic diastolic heart failure: No evidence of volume overload  Encounter for therapeutic drug monitoring: Followed anticoagulation clinic  Thoracic aortic aneurysm : Should be followed by Dr. Cyndia Bent      Current medicines are reviewed at length with the patient today.  The patient does not have concerns regarding medicines.  The following changes have been made:  no change  Labs/ tests ordered today include:  No orders of the defined types were placed in this encounter.     Disposition:   FU with Linard Millers in one year Signed, Sinclair Grooms, MD  06/06/2014 9:16 AM    [facility] [place], [place], [place]  [address] Phone: [phone]; Fax: [phone]

## 2014-06-14 ENCOUNTER — Other Ambulatory Visit: Payer: Self-pay | Admitting: Interventional Cardiology

## 2014-06-28 ENCOUNTER — Ambulatory Visit (INDEPENDENT_AMBULATORY_CARE_PROVIDER_SITE_OTHER): Payer: 59 | Admitting: *Deleted

## 2014-06-28 DIAGNOSIS — Z5181 Encounter for therapeutic drug level monitoring: Secondary | ICD-10-CM | POA: Diagnosis not present

## 2014-06-28 DIAGNOSIS — I059 Rheumatic mitral valve disease, unspecified: Secondary | ICD-10-CM

## 2014-06-28 LAB — POCT INR: INR: 3.2

## 2014-07-02 ENCOUNTER — Telehealth: Payer: Self-pay

## 2014-07-02 NOTE — Telephone Encounter (Signed)
2 grams 1 hour prior to Dental work. Therefore needs 500 mg tabs and should take 4 tabs. He should be given multiple refills.

## 2014-07-02 NOTE — Telephone Encounter (Signed)
How may mg and how often there is nothing on his chart Thanks

## 2014-07-02 NOTE — Telephone Encounter (Signed)
Yes, please refill amoxicillin as he has used in the past for prophylaxis to prevent endocarditis.

## 2014-07-03 ENCOUNTER — Other Ambulatory Visit: Payer: Self-pay

## 2014-07-03 MED ORDER — AMOXICILLIN 500 MG PO TABS: ORAL_TABLET | ORAL | Status: DC

## 2014-07-26 ENCOUNTER — Ambulatory Visit (INDEPENDENT_AMBULATORY_CARE_PROVIDER_SITE_OTHER): Payer: 59 | Admitting: *Deleted

## 2014-07-26 DIAGNOSIS — Z5181 Encounter for therapeutic drug level monitoring: Secondary | ICD-10-CM | POA: Diagnosis not present

## 2014-07-26 DIAGNOSIS — I059 Rheumatic mitral valve disease, unspecified: Secondary | ICD-10-CM

## 2014-07-26 LAB — POCT INR: INR: 2.2

## 2014-08-23 ENCOUNTER — Ambulatory Visit (INDEPENDENT_AMBULATORY_CARE_PROVIDER_SITE_OTHER): Payer: 59

## 2014-08-23 DIAGNOSIS — Z5181 Encounter for therapeutic drug level monitoring: Secondary | ICD-10-CM | POA: Diagnosis not present

## 2014-08-23 DIAGNOSIS — I059 Rheumatic mitral valve disease, unspecified: Secondary | ICD-10-CM | POA: Diagnosis not present

## 2014-08-23 LAB — POCT INR: INR: 2.4

## 2014-09-23 ENCOUNTER — Ambulatory Visit (INDEPENDENT_AMBULATORY_CARE_PROVIDER_SITE_OTHER): Payer: 59 | Admitting: *Deleted

## 2014-09-23 DIAGNOSIS — Z5181 Encounter for therapeutic drug level monitoring: Secondary | ICD-10-CM

## 2014-09-23 DIAGNOSIS — I059 Rheumatic mitral valve disease, unspecified: Secondary | ICD-10-CM | POA: Diagnosis not present

## 2014-09-23 LAB — POCT INR: INR: 3.1

## 2014-10-21 ENCOUNTER — Other Ambulatory Visit: Payer: Self-pay | Admitting: Interventional Cardiology

## 2014-10-21 ENCOUNTER — Ambulatory Visit (INDEPENDENT_AMBULATORY_CARE_PROVIDER_SITE_OTHER): Payer: 59 | Admitting: *Deleted

## 2014-10-21 DIAGNOSIS — Z5181 Encounter for therapeutic drug level monitoring: Secondary | ICD-10-CM

## 2014-10-21 DIAGNOSIS — I059 Rheumatic mitral valve disease, unspecified: Secondary | ICD-10-CM | POA: Diagnosis not present

## 2014-10-21 LAB — POCT INR: INR: 3

## 2014-11-22 ENCOUNTER — Ambulatory Visit (INDEPENDENT_AMBULATORY_CARE_PROVIDER_SITE_OTHER): Payer: 59

## 2014-11-22 DIAGNOSIS — I059 Rheumatic mitral valve disease, unspecified: Secondary | ICD-10-CM

## 2014-11-22 DIAGNOSIS — Z5181 Encounter for therapeutic drug level monitoring: Secondary | ICD-10-CM

## 2014-11-22 LAB — POCT INR: INR: 3.2

## 2015-01-03 ENCOUNTER — Ambulatory Visit (INDEPENDENT_AMBULATORY_CARE_PROVIDER_SITE_OTHER): Payer: 59 | Admitting: *Deleted

## 2015-01-03 DIAGNOSIS — Z5181 Encounter for therapeutic drug level monitoring: Secondary | ICD-10-CM | POA: Diagnosis not present

## 2015-01-03 DIAGNOSIS — I059 Rheumatic mitral valve disease, unspecified: Secondary | ICD-10-CM | POA: Diagnosis not present

## 2015-01-03 LAB — POCT INR: INR: 3.9

## 2015-02-06 ENCOUNTER — Ambulatory Visit (INDEPENDENT_AMBULATORY_CARE_PROVIDER_SITE_OTHER): Payer: 59 | Admitting: *Deleted

## 2015-02-06 DIAGNOSIS — Z5181 Encounter for therapeutic drug level monitoring: Secondary | ICD-10-CM | POA: Diagnosis not present

## 2015-02-06 DIAGNOSIS — I059 Rheumatic mitral valve disease, unspecified: Secondary | ICD-10-CM

## 2015-02-06 LAB — POCT INR: INR: 3

## 2015-02-22 ENCOUNTER — Other Ambulatory Visit: Payer: Self-pay | Admitting: Interventional Cardiology

## 2015-03-07 ENCOUNTER — Ambulatory Visit (INDEPENDENT_AMBULATORY_CARE_PROVIDER_SITE_OTHER): Payer: 59 | Admitting: Podiatry

## 2015-03-07 ENCOUNTER — Ambulatory Visit (INDEPENDENT_AMBULATORY_CARE_PROVIDER_SITE_OTHER): Payer: 59

## 2015-03-07 ENCOUNTER — Ambulatory Visit (INDEPENDENT_AMBULATORY_CARE_PROVIDER_SITE_OTHER): Payer: 59 | Admitting: *Deleted

## 2015-03-07 ENCOUNTER — Encounter: Payer: Self-pay | Admitting: Podiatry

## 2015-03-07 VITALS — BP 131/65 | HR 60 | Resp 16

## 2015-03-07 DIAGNOSIS — Q828 Other specified congenital malformations of skin: Secondary | ICD-10-CM

## 2015-03-07 DIAGNOSIS — Z5181 Encounter for therapeutic drug level monitoring: Secondary | ICD-10-CM

## 2015-03-07 DIAGNOSIS — I059 Rheumatic mitral valve disease, unspecified: Secondary | ICD-10-CM

## 2015-03-07 DIAGNOSIS — M79673 Pain in unspecified foot: Secondary | ICD-10-CM

## 2015-03-07 DIAGNOSIS — M722 Plantar fascial fibromatosis: Secondary | ICD-10-CM | POA: Diagnosis not present

## 2015-03-07 DIAGNOSIS — M779 Enthesopathy, unspecified: Secondary | ICD-10-CM

## 2015-03-07 LAB — POCT INR: INR: 2.8

## 2015-03-07 MED ORDER — TRIAMCINOLONE ACETONIDE 10 MG/ML IJ SUSP
10.0000 mg | Freq: Once | INTRAMUSCULAR | Status: AC
  Administered 2015-03-07: 10 mg

## 2015-03-09 NOTE — Progress Notes (Signed)
Subjective:     Patient ID: [name], male   DOB: Nov 27, 1950, 64 y.o.   MRN: [id number]  HPI patient presents with exquisite discomfort underneath the left foot around the metatarsal and states that it's hard for him to walk   Review of Systems     Objective:   Physical Exam  neurovascular status intact muscle strength adequate range of motion within normal limits with patient noted to have pain underneath the fourth metatarsal head with inflammation and fluid buildup and deep keratotic lesion formation    Assessment:      inflammatory capsulitis fourth MPJ left plantar with lesion formation    Plan:      reviewed condition and did careful plantar capsule injection and injected the keratotic area 3 mg Kenalog 5 mill grams Xylocaine and then did deep debridement of lesion with no iatrogenic bleeding noted. Reappoint as needed

## 2015-04-04 ENCOUNTER — Ambulatory Visit (INDEPENDENT_AMBULATORY_CARE_PROVIDER_SITE_OTHER): Payer: 59 | Admitting: *Deleted

## 2015-04-04 DIAGNOSIS — Z5181 Encounter for therapeutic drug level monitoring: Secondary | ICD-10-CM

## 2015-04-04 DIAGNOSIS — I059 Rheumatic mitral valve disease, unspecified: Secondary | ICD-10-CM | POA: Diagnosis not present

## 2015-04-04 LAB — POCT INR: INR: 3

## 2015-05-16 ENCOUNTER — Ambulatory Visit (INDEPENDENT_AMBULATORY_CARE_PROVIDER_SITE_OTHER): Payer: 59 | Admitting: *Deleted

## 2015-05-16 DIAGNOSIS — I059 Rheumatic mitral valve disease, unspecified: Secondary | ICD-10-CM

## 2015-05-16 DIAGNOSIS — Z5181 Encounter for therapeutic drug level monitoring: Secondary | ICD-10-CM | POA: Diagnosis not present

## 2015-05-16 LAB — POCT INR: INR: 3.2

## 2015-06-27 ENCOUNTER — Ambulatory Visit (INDEPENDENT_AMBULATORY_CARE_PROVIDER_SITE_OTHER): Payer: 59 | Admitting: *Deleted

## 2015-06-27 DIAGNOSIS — Z5181 Encounter for therapeutic drug level monitoring: Secondary | ICD-10-CM

## 2015-06-27 DIAGNOSIS — I059 Rheumatic mitral valve disease, unspecified: Secondary | ICD-10-CM

## 2015-06-27 LAB — POCT INR: INR: 3.6

## 2015-06-30 ENCOUNTER — Other Ambulatory Visit: Payer: Self-pay | Admitting: Interventional Cardiology

## 2015-07-20 ENCOUNTER — Other Ambulatory Visit: Payer: Self-pay | Admitting: Interventional Cardiology

## 2015-08-04 ENCOUNTER — Ambulatory Visit (INDEPENDENT_AMBULATORY_CARE_PROVIDER_SITE_OTHER): Payer: 59 | Admitting: *Deleted

## 2015-08-04 DIAGNOSIS — Z5181 Encounter for therapeutic drug level monitoring: Secondary | ICD-10-CM

## 2015-08-04 DIAGNOSIS — I059 Rheumatic mitral valve disease, unspecified: Secondary | ICD-10-CM | POA: Diagnosis not present

## 2015-08-04 LAB — POCT INR: INR: 3.8

## 2015-08-29 ENCOUNTER — Ambulatory Visit (INDEPENDENT_AMBULATORY_CARE_PROVIDER_SITE_OTHER): Payer: 59 | Admitting: *Deleted

## 2015-08-29 DIAGNOSIS — I059 Rheumatic mitral valve disease, unspecified: Secondary | ICD-10-CM | POA: Diagnosis not present

## 2015-08-29 LAB — POCT INR: INR: 3.1

## 2015-09-03 ENCOUNTER — Encounter: Payer: Self-pay | Admitting: Podiatry

## 2015-09-03 ENCOUNTER — Ambulatory Visit (INDEPENDENT_AMBULATORY_CARE_PROVIDER_SITE_OTHER): Payer: 59 | Admitting: Podiatry

## 2015-09-03 DIAGNOSIS — Q828 Other specified congenital malformations of skin: Secondary | ICD-10-CM

## 2015-09-03 DIAGNOSIS — M779 Enthesopathy, unspecified: Secondary | ICD-10-CM

## 2015-09-03 MED ORDER — TRIAMCINOLONE ACETONIDE 10 MG/ML IJ SUSP
10.0000 mg | Freq: Once | INTRAMUSCULAR | Status: AC
  Administered 2015-09-03: 10 mg

## 2015-09-03 NOTE — Progress Notes (Signed)
Subjective:     Patient ID: [name], male   DOB: 1950-04-22, 65 y.o.   MRN: [id number]  HPI patient states this spot on the bottom my right foot is really bothering me and it started up again in the last several months   Review of Systems     Objective:   Physical Exam Neurovascular status intact with plantar flexed fourth metatarsal left with inflammatory capsulitis and keratotic lesion formation that's very painful when pressed    Assessment:     Chronic lesion formation secondary to foot structure with inflammation and fluid buildup    Plan:     Careful injection of the plantar capsule administered 3 mg dexamethasone Kenalog 5 mg Xylocaine and did full debridement of lesion and scanned for custom orthotics to reduce all pressure. Reappoint to recheck

## 2015-09-26 ENCOUNTER — Ambulatory Visit (INDEPENDENT_AMBULATORY_CARE_PROVIDER_SITE_OTHER): Payer: 59 | Admitting: *Deleted

## 2015-09-26 DIAGNOSIS — Z5181 Encounter for therapeutic drug level monitoring: Secondary | ICD-10-CM

## 2015-09-26 DIAGNOSIS — I059 Rheumatic mitral valve disease, unspecified: Secondary | ICD-10-CM

## 2015-09-26 LAB — POCT INR: INR: 3.4

## 2015-10-01 ENCOUNTER — Ambulatory Visit: Payer: 59 | Admitting: *Deleted

## 2015-10-01 DIAGNOSIS — M79673 Pain in unspecified foot: Secondary | ICD-10-CM

## 2015-10-01 NOTE — Progress Notes (Signed)
Patient ID: [name], male   DOB: 05/31/1950, 65 y.o.   MRN: [id number] Patient presents for orthotic pick up.  Verbal and written break in and wear instructions given.  Patient will follow up in 4 weeks if symptoms worsen or fail to improve.

## 2015-10-01 NOTE — Patient Instructions (Signed)

## 2015-10-24 ENCOUNTER — Ambulatory Visit (INDEPENDENT_AMBULATORY_CARE_PROVIDER_SITE_OTHER): Payer: 59 | Admitting: Surgery

## 2015-10-24 DIAGNOSIS — Z5181 Encounter for therapeutic drug level monitoring: Secondary | ICD-10-CM

## 2015-10-24 DIAGNOSIS — I059 Rheumatic mitral valve disease, unspecified: Secondary | ICD-10-CM

## 2015-10-24 LAB — POCT INR: INR: 4.2

## 2015-11-03 ENCOUNTER — Other Ambulatory Visit: Payer: Self-pay | Admitting: Interventional Cardiology

## 2015-11-14 ENCOUNTER — Ambulatory Visit (INDEPENDENT_AMBULATORY_CARE_PROVIDER_SITE_OTHER): Payer: 59 | Admitting: *Deleted

## 2015-11-14 DIAGNOSIS — Z5181 Encounter for therapeutic drug level monitoring: Secondary | ICD-10-CM

## 2015-11-14 DIAGNOSIS — I059 Rheumatic mitral valve disease, unspecified: Secondary | ICD-10-CM | POA: Diagnosis not present

## 2015-11-14 LAB — POCT INR: INR: 4.4

## 2015-11-28 ENCOUNTER — Ambulatory Visit (INDEPENDENT_AMBULATORY_CARE_PROVIDER_SITE_OTHER): Payer: 59 | Admitting: Pharmacist

## 2015-11-28 DIAGNOSIS — Z5181 Encounter for therapeutic drug level monitoring: Secondary | ICD-10-CM

## 2015-11-28 DIAGNOSIS — I059 Rheumatic mitral valve disease, unspecified: Secondary | ICD-10-CM

## 2015-11-28 LAB — POCT INR: INR: 3.2

## 2015-12-12 ENCOUNTER — Ambulatory Visit (INDEPENDENT_AMBULATORY_CARE_PROVIDER_SITE_OTHER): Payer: 59 | Admitting: *Deleted

## 2015-12-12 DIAGNOSIS — Z5181 Encounter for therapeutic drug level monitoring: Secondary | ICD-10-CM | POA: Diagnosis not present

## 2015-12-12 DIAGNOSIS — I059 Rheumatic mitral valve disease, unspecified: Secondary | ICD-10-CM

## 2015-12-12 LAB — POCT INR: INR: 4.6

## 2015-12-26 ENCOUNTER — Ambulatory Visit (INDEPENDENT_AMBULATORY_CARE_PROVIDER_SITE_OTHER): Payer: 59 | Admitting: *Deleted

## 2015-12-26 DIAGNOSIS — I059 Rheumatic mitral valve disease, unspecified: Secondary | ICD-10-CM | POA: Diagnosis not present

## 2015-12-26 DIAGNOSIS — Z5181 Encounter for therapeutic drug level monitoring: Secondary | ICD-10-CM

## 2015-12-26 LAB — POCT INR: INR: 4.2

## 2016-01-16 ENCOUNTER — Ambulatory Visit (INDEPENDENT_AMBULATORY_CARE_PROVIDER_SITE_OTHER): Payer: 59 | Admitting: Pharmacist

## 2016-01-16 DIAGNOSIS — Z5181 Encounter for therapeutic drug level monitoring: Secondary | ICD-10-CM | POA: Diagnosis not present

## 2016-01-16 DIAGNOSIS — I059 Rheumatic mitral valve disease, unspecified: Secondary | ICD-10-CM

## 2016-01-16 LAB — POCT INR: INR: 3.7

## 2016-01-20 ENCOUNTER — Other Ambulatory Visit: Payer: Self-pay | Admitting: Interventional Cardiology

## 2016-01-21 NOTE — Telephone Encounter (Signed)
Spoke with pt to clarify that he is having some dental work.  Pt states he has an appt tomorrow.  Clarified with pt that he normally takes Amoxicillin due to allergies listed.  Pt states that he does take Amoxicillin prior to dental work.  Advised we will send prescription.  Pt verbalized understanding.

## 2016-02-06 ENCOUNTER — Ambulatory Visit (INDEPENDENT_AMBULATORY_CARE_PROVIDER_SITE_OTHER): Payer: 59 | Admitting: *Deleted

## 2016-02-06 DIAGNOSIS — I059 Rheumatic mitral valve disease, unspecified: Secondary | ICD-10-CM | POA: Diagnosis not present

## 2016-02-06 DIAGNOSIS — Z5181 Encounter for therapeutic drug level monitoring: Secondary | ICD-10-CM

## 2016-02-06 LAB — POCT INR: INR: 2.3

## 2016-02-13 ENCOUNTER — Telehealth: Payer: Self-pay | Admitting: Interventional Cardiology

## 2016-02-13 NOTE — Telephone Encounter (Signed)
Spoke with pt and moved January appt up to 02/20/16.

## 2016-02-13 NOTE — Telephone Encounter (Signed)
Request for surgical clearance:  1. What type of surgery is being performed? Colonoscopy   2. When is this surgery scheduled? 03/11/16   3. Are there any medications that need to be held prior to surgery and how long? Coumadin   4. Name of physician performing surgery? Dr. Michail Sermon   5. What is your office phone and fax number? Ph [phone]  Fax [phone]   Dr. Tamala Julian-  Pt has not been seen in office since 06/2014 other than Cou[facility]in Clinic.  Pt currently scheduled to see you in January but can move up if necessary.

## 2016-02-16 NOTE — Telephone Encounter (Signed)
This will work.

## 2016-02-20 ENCOUNTER — Ambulatory Visit (INDEPENDENT_AMBULATORY_CARE_PROVIDER_SITE_OTHER): Payer: 59 | Admitting: *Deleted

## 2016-02-20 ENCOUNTER — Ambulatory Visit (INDEPENDENT_AMBULATORY_CARE_PROVIDER_SITE_OTHER): Payer: 59 | Admitting: Interventional Cardiology

## 2016-02-20 ENCOUNTER — Encounter: Payer: Self-pay | Admitting: Interventional Cardiology

## 2016-02-20 VITALS — BP 116/64 | HR 60 | Ht 73.0 in | Wt 151.1 lb

## 2016-02-20 DIAGNOSIS — Z5181 Encounter for therapeutic drug level monitoring: Secondary | ICD-10-CM

## 2016-02-20 DIAGNOSIS — I5032 Chronic diastolic (congestive) heart failure: Secondary | ICD-10-CM | POA: Diagnosis not present

## 2016-02-20 DIAGNOSIS — I712 Thoracic aortic aneurysm, without rupture, unspecified: Secondary | ICD-10-CM

## 2016-02-20 DIAGNOSIS — I4892 Unspecified atrial flutter: Secondary | ICD-10-CM | POA: Diagnosis not present

## 2016-02-20 DIAGNOSIS — I059 Rheumatic mitral valve disease, unspecified: Secondary | ICD-10-CM | POA: Diagnosis not present

## 2016-02-20 LAB — BASIC METABOLIC PANEL
BUN: 17 mg/dL (ref 7–25)
CALCIUM: 9.1 mg/dL (ref 8.6–10.3)
CO2: 27 mmol/L (ref 20–31)
Chloride: 109 mmol/L (ref 98–110)
Creat: 1.38 mg/dL — ABNORMAL HIGH (ref 0.70–1.25)
GLUCOSE: 72 mg/dL (ref 65–99)
Potassium: 4.2 mmol/L (ref 3.5–5.3)
SODIUM: 139 mmol/L (ref 135–146)

## 2016-02-20 LAB — POCT INR: INR: 2.6

## 2016-02-20 NOTE — Progress Notes (Signed)
Cardiology Office Note    Date:  02/20/2016   ID:  [name], DOB 1950-07-06, MRN [id number]  PCP:  Jesse Pac, MD  Cardiologist: Jesse Grooms, MD   Chief Complaint  Patient presents with  . Atrial Fibrillation  . Cardiac Valve Problem    History of Present Illness:  [name] is a 65 y.o. 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 Other problems include hepatitis C, chronic atrial flutter, ascending aortic aneurysm, and chronic kidney disease stage III. He also has chronic anemia.  He has chronic lower extremity edema. We initially used diuretic therapy but this is resolved. It is due to the presence of pulmonary hypertension. He is not currently on regular diuretic therapy. He has furosemide 40 mg that he uses as needed if significant lower extremity swelling.  He is now seeing Dr. Vanetta [name] for kidney evaluation. Dr. Michail [name] is planning a colonoscopy in early December.  He denies melena. He has intermittent hematuria.  Past Medical History:  Diagnosis Date  . Allergy   . Aneurysm of ascending aorta (HCC)   . Atrial fibrillation ([place])   . BPH (benign prostatic hyperplasia)   . Colon polyps   . FHx: rheumatic heart disease   . H/O diplopia   . Hepatitis C   . Hypertension   . Hypogonadism male   . Mitral valve disease     Past Surgical History:  Procedure Laterality Date  . COLON SURGERY    . COLONOSCOPY    . COLONOSCOPY N/A 11/14/2012   Procedure: COLONOSCOPY;  Surgeon: Jesse Ng, MD;  [facility];  Service: Endoscopy;  Laterality: N/A;  . ESOPHAGOGASTRODUODENOSCOPY (EGD) WITH PROPOFOL N/A 05/08/2014   Procedure: ESOPHAGOGASTRODUODENOSCOPY (EGD) WITH PROPOFOL;  Surgeon: Jesse Ng, MD;  Location: [facility];  Service: Endoscopy;   Laterality: N/A;  . MITRAL VALVE REPLACEMENT      Current Medications: Outpatient Medications Prior to Visit  Medication Sig Dispense Refill  . Multiple Vitamin (MULTIVITAMIN WITH MINERALS) TABS tablet Take 1 tablet by mouth daily.    Marland Kitchen testosterone enanthate (DELATESTRYL) 200 MG/ML injection Inject into the muscle once a week. For IM use only    . amoxicillin (AMOXIL) 500 MG capsule TAKE FOUR CAPSULES BY MOUTH 1 HOUR BEFORE DENTAL APPOINTMENT (Patient not taking: Reported on 02/20/2016) 4 capsule 2  . amoxicillin (AMOXIL) 500 MG capsule TAKE FOUR CAPSULES BY MOUTH 1 HOUR BEFORE DENTAL APPOINTMENT (Patient not taking: Reported on 02/20/2016) 4 capsule 0  . warfarin (COUMADIN) 5 MG tablet TAKE AS DIRECTED 50 tablet 3   No facility-administered medications prior to visit.      Allergies:   Ampicillin and Penicillins   Social History   Social History  . Marital status: Legally Separated    Spouse name: N/A  . Number of children: N/A  . Years of education: N/A   Social History Main Topics  . Smoking status: Current Some Day Smoker    Types: Cigars  . Smokeless tobacco: Never Used  . Alcohol use No  . Drug use: No  . Sexual activity: Not Asked   Other Topics Concern  . None   Social History Narrative  . None     Family History:  The patient's Family history is unknown by patient.   ROS:   Please see the history  of present illness.    Leg swelling, fatigue, some dyspnea on exertion.  All other systems reviewed and are negative.   PHYSICAL EXAM:   VS:  BP 116/64   Pulse 60   Ht 6\' 1"  (1.854 m)   Wt 151 lb 1.9 oz (68.5 kg)   BMI 19.94 kg/m    GEN: Well nourished, well developed, in no acute distress  HEENT: normal  Neck: carotid bruits, or masses. Marked JVD with significant V waves to the angle of the jaw with the patient lying at 45. Cardiac: RRR; there is a diastolic rumble heard at the left ventricular apex. A soft apical systolic murmur is heard. Crisp  mechanical valve sounds are heard. There is no rub or gallop. There is 2+ bilateral ankle edema . Respiratory:  clear to auscultation bilaterally, normal work of breathing GI: soft, nontender, nondistended, + BS MS: no deformity or atrophy  Skin: warm and dry, no rash Neuro:  Alert and Oriented x 3, Strength and sensation are intact Psych: euthymic mood, full affect  Wt Readings from Last 3 Encounters:  02/20/16 151 lb 1.9 oz (68.5 kg)  06/06/14 152 lb (68.9 kg)  05/22/14 152 lb (68.9 kg)      Studies/Labs Reviewed:   EKG:  EKG  Atrial flutter with 41 AV conduction and effective heart rate of 60 bpm. No change compared to March 2016.  Recent Labs: No results found for requested labs within last 8760 hours.   Lipid Panel No results found for: CHOL, TRIG, HDL, CHOLHDL, VLDL, LDLCALC, LDLDIRECT  Additional studies/ records that were reviewed today include:  Problem list from [facility] EMR 2015: Rheumatic heart disease with mitral valve repair/replacement x3 (1973 porcine, [employer] mechanical 1988, mechanical prosthesis [facility]/lower 2012)   Ascending aortic aneurysm, 4.7x4.8 cm followed by Dr. Cyndia Bent CVTS surgeon   Elevated blood pressures   Diplopia, dizziness Dr. Doy [name] 3/11, MRI/MRA ordered   ED, hypogonadism, BPH, hematuria followed by urologist Dr.Dahlstedt   adenomatous colon polyp history followed by gastroenterologist Dr. Michail [name], last colonoscopy August 03, 2007, repeat in 5 years   h/o hematuria 2006,2009 Dr.Dahlstedt Had [place] abdominal CT and cystoscopy in 11/2004   Chronic atrial fibrillation followed by cardiologist Dr. Tamala Julian   elevated CKs-myositis antibodies neg, +Hep C   Hep[facility] C followed [facility], felt secondary to transfusion in the 1970's, 2012 biopsy without cirrhosis      ASSESSMENT:    1. Mitral valve disorder   2. Chronic atrial flutter (HCC)   3. Chronic diastolic heart failure ([place])   4. Thoracic aortic aneurysm  without rupture ([place])   5. Encounter for therapeutic drug monitoring      PLAN:  In order of problems listed above:  1. Mechanical mitral valve prosthesis with clinically normal function. He will need to have bridging to allow colonoscopy. This will be arranged in the Coumadin clinic. We need a 2-D Doppler echocardiogram to assess both mitral valve function, LV and RV, pulmonary pressures, and delicate the aortic diameter. 2. Stable rhythm with no change over the past year. 3. There is evidence of right heart failure likely due to pulmonary hypertension from mitral valve disease and diastolic left ventricular dysfunction. He is asymptomatic with reference to dyspnea and there is no excessive volume overload. 4. This followed by Dr. Cyndia Bent on an annual basis. 5. He is on Coumadin without complications. He will need to be bridged for the upcoming colonoscopy during the first week in December.  Medication Adjustments/Labs and Tests Ordered: Current medicines are reviewed at length with the patient today.  Concerns regarding medicines are outlined above.  Medication changes, Labs and Tests ordered today are listed in the Patient Instructions below. There are no Patient Instructions on file for this visit.   Signed, Jesse Grooms, MD  02/20/2016 2:15 PM    [facility] [place], [place], [place]  [address] Phone: [phone]; Fax: [phone]

## 2016-02-20 NOTE — Patient Instructions (Signed)
Medication Instructions:  Your physician recommends that you continue on your current medications as directed. Please refer to the Current Medication list given to you today.   Labwork: Lab work to be done today--BMP  Testing/Procedures: Your physician has requested that you have an echocardiogram. Echocardiography is a painless test that uses sound waves to create images of your heart. It provides your doctor with information about the size and shape of your heart and how well your heart's chambers and valves are working. This procedure takes approximately one hour. There are no restrictions for this procedure.    Follow-Up: Your physician wants you to follow-up in: 12 months.  You will receive a reminder letter in the mail two months in advance. If you don't receive a letter, please call our office to schedule the follow-up appointment.   Any Other Special Instructions Will Be Listed Below (If Applicable).     If you need a refill on your cardiac medications before your next appointment, please call your pharmacy.

## 2016-02-24 MED ORDER — ENOXAPARIN SODIUM 60 MG/0.6ML ~~LOC~~ SOLN: 60.0000 mg | Freq: Two times a day (BID) | SUBCUTANEOUS | 1 refills | Status: DC

## 2016-02-24 NOTE — Patient Instructions (Signed)
03/05/16- Last dose of Coumadin.  03/06/16- No Coumadin or Lovenox.  03/07/16-Inject Lovenox 60 mg in the fatty abdominal tissue at least 2 inches from the belly button twice a day about 12 hours apart, 8am and 8pm rotate sites. No Coumadin.  03/08/16-  Inject Lovenox 60 mg in the fatty tissue every 12 hours, 8am and 8pm. No Coumadin.  03/09/16- Inject Lovenox 60 mg in the fatty tissue every 12 hours, 8am and 8pm. No Coumadin.  03/10/16- Inject Lovenox 60 mg in the fatty tissue in the morning at 8 am (No PM dose). No Coumadin.  03/11/16/- Procedure Day - No Lovenox - Resume Coumadin in the evening or as directed by doctor (take an extra half tablet with usual dose for 2 days then resume normal dose).  03/12/16- Resume Lovenox inject in the fatty tissue every 12 hours and take Coumadin.  03/13/16- Inject Lovenox in the fatty tissue every 12 hours and take Coumadin.  12/101/17- Inject Lovenox in the fatty tissue every 12 hours and take Coumadin.  03/15/16- Inject Lovenox in the fatty tissue every 12 hours and take Coumadin.  03/16/16- Inject Lovenox in the fatty tissue every 12 hours and take Coumadin., Coumadin appt. @ 9am.

## 2016-02-24 NOTE — Telephone Encounter (Signed)
Will route back to Dr. Tamala Julian to see if pt is cleared for colonoscopy now that he has been seen.

## 2016-02-24 NOTE — Telephone Encounter (Signed)
Patient is cleared for colonoscopy. Needs bridging of coumadin with lovenox. Also

## 2016-02-25 NOTE — Telephone Encounter (Signed)
Pt has already been instructed on Lovenox bridge prior to upcoming colonoscopy, see anticoagulation note in Epic from 02/20/16.

## 2016-02-25 NOTE — Telephone Encounter (Signed)
Will forward to Coumadin Clinic for Lovenox bridging.   Clearance faxed.

## 2016-03-03 ENCOUNTER — Other Ambulatory Visit: Payer: Self-pay | Admitting: Interventional Cardiology

## 2016-03-16 ENCOUNTER — Ambulatory Visit (INDEPENDENT_AMBULATORY_CARE_PROVIDER_SITE_OTHER): Payer: 59 | Admitting: *Deleted

## 2016-03-16 DIAGNOSIS — Z5181 Encounter for therapeutic drug level monitoring: Secondary | ICD-10-CM | POA: Diagnosis not present

## 2016-03-16 DIAGNOSIS — I059 Rheumatic mitral valve disease, unspecified: Secondary | ICD-10-CM

## 2016-03-16 LAB — POCT INR: INR: 2.2

## 2016-03-19 ENCOUNTER — Ambulatory Visit (INDEPENDENT_AMBULATORY_CARE_PROVIDER_SITE_OTHER): Payer: 59

## 2016-03-19 DIAGNOSIS — I059 Rheumatic mitral valve disease, unspecified: Secondary | ICD-10-CM | POA: Diagnosis not present

## 2016-03-19 DIAGNOSIS — Z5181 Encounter for therapeutic drug level monitoring: Secondary | ICD-10-CM

## 2016-03-19 LAB — POCT INR: INR: 3

## 2016-03-30 ENCOUNTER — Other Ambulatory Visit (HOSPITAL_COMMUNITY): Payer: 59

## 2016-04-15 ENCOUNTER — Ambulatory Visit: Payer: 59 | Admitting: Interventional Cardiology

## 2016-04-16 ENCOUNTER — Ambulatory Visit (INDEPENDENT_AMBULATORY_CARE_PROVIDER_SITE_OTHER): Payer: 59 | Admitting: *Deleted

## 2016-04-16 DIAGNOSIS — I059 Rheumatic mitral valve disease, unspecified: Secondary | ICD-10-CM | POA: Diagnosis not present

## 2016-04-16 DIAGNOSIS — Z5181 Encounter for therapeutic drug level monitoring: Secondary | ICD-10-CM

## 2016-04-16 LAB — POCT INR: INR: 2.5

## 2016-04-19 ENCOUNTER — Other Ambulatory Visit: Payer: Self-pay

## 2016-04-19 ENCOUNTER — Ambulatory Visit (HOSPITAL_COMMUNITY): Payer: 59 | Attending: Cardiovascular Disease

## 2016-04-19 DIAGNOSIS — Z952 Presence of prosthetic heart valve: Secondary | ICD-10-CM | POA: Insufficient documentation

## 2016-04-19 DIAGNOSIS — I129 Hypertensive chronic kidney disease with stage 1 through stage 4 chronic kidney disease, or unspecified chronic kidney disease: Secondary | ICD-10-CM | POA: Insufficient documentation

## 2016-04-19 DIAGNOSIS — B1920 Unspecified viral hepatitis C without hepatic coma: Secondary | ICD-10-CM | POA: Diagnosis not present

## 2016-04-19 DIAGNOSIS — I351 Nonrheumatic aortic (valve) insufficiency: Secondary | ICD-10-CM | POA: Insufficient documentation

## 2016-04-19 DIAGNOSIS — I059 Rheumatic mitral valve disease, unspecified: Secondary | ICD-10-CM | POA: Insufficient documentation

## 2016-04-19 DIAGNOSIS — Z720 Tobacco use: Secondary | ICD-10-CM | POA: Diagnosis not present

## 2016-04-19 DIAGNOSIS — I712 Thoracic aortic aneurysm, without rupture: Secondary | ICD-10-CM | POA: Insufficient documentation

## 2016-04-19 DIAGNOSIS — N189 Chronic kidney disease, unspecified: Secondary | ICD-10-CM | POA: Insufficient documentation

## 2016-04-19 DIAGNOSIS — I4891 Unspecified atrial fibrillation: Secondary | ICD-10-CM | POA: Diagnosis not present

## 2016-04-23 ENCOUNTER — Other Ambulatory Visit: Payer: Self-pay | Admitting: *Deleted

## 2016-04-23 DIAGNOSIS — I712 Thoracic aortic aneurysm, without rupture, unspecified: Secondary | ICD-10-CM

## 2016-04-24 ENCOUNTER — Other Ambulatory Visit: Payer: Self-pay | Admitting: Interventional Cardiology

## 2016-04-26 NOTE — Telephone Encounter (Signed)
Only if he is having dental work soon.

## 2016-05-14 ENCOUNTER — Telehealth: Payer: Self-pay | Admitting: Interventional Cardiology

## 2016-05-14 NOTE — Telephone Encounter (Signed)
Spoke with pt and he states he cannot make his appt today and that  He has talked with the [facility] phone [phone]  where his PCP is Dr Dawna Part and they will check his INR and follow his coumadin but he wants appt today rescheduled for Monday Feb 12th . Pt instructed that we will have to check with the [facility] to be sure he can get his INR checked and they will follow him so we will call and verify this on his coumadin  visit on Monday  and he states understanding

## 2016-05-14 NOTE — Telephone Encounter (Signed)
New Message:   Pt says he would like to get his Coumadin at the walk- in clinic on [address] instead of [employer] please.

## 2016-05-17 ENCOUNTER — Ambulatory Visit (INDEPENDENT_AMBULATORY_CARE_PROVIDER_SITE_OTHER): Payer: 59 | Admitting: *Deleted

## 2016-05-17 DIAGNOSIS — Z5181 Encounter for therapeutic drug level monitoring: Secondary | ICD-10-CM | POA: Diagnosis not present

## 2016-05-17 DIAGNOSIS — I059 Rheumatic mitral valve disease, unspecified: Secondary | ICD-10-CM | POA: Diagnosis not present

## 2016-05-17 LAB — POCT INR: INR: 3

## 2016-05-20 ENCOUNTER — Other Ambulatory Visit: Payer: Self-pay | Admitting: Surgery

## 2016-05-24 ENCOUNTER — Other Ambulatory Visit: Payer: Self-pay | Admitting: *Deleted

## 2016-05-24 MED ORDER — AMOXICILLIN 500 MG PO CAPS: 2000.0000 mg | ORAL_CAPSULE | ORAL | 0 refills | Status: DC | PRN

## 2016-05-24 NOTE — Telephone Encounter (Signed)
wants amoxicillian for a dental procedure, please send to the [facility] listed, thanks

## 2016-05-26 ENCOUNTER — Ambulatory Visit (INDEPENDENT_AMBULATORY_CARE_PROVIDER_SITE_OTHER): Payer: 59 | Admitting: Surgery

## 2016-05-26 ENCOUNTER — Ambulatory Visit
Admission: RE | Admit: 2016-05-26 | Discharge: 2016-05-26 | Disposition: A | Discharge status: Home / Self Care | Payer: 59 | Source: Ambulatory Visit | Attending: Surgery | Admitting: Surgery

## 2016-05-26 ENCOUNTER — Encounter: Payer: Self-pay | Admitting: Surgery

## 2016-05-26 VITALS — BP 130/79 | HR 60 | Resp 20 | Ht 73.0 in | Wt 151.6 lb

## 2016-05-26 DIAGNOSIS — I719 Aortic aneurysm of unspecified site, without rupture: Secondary | ICD-10-CM | POA: Diagnosis not present

## 2016-05-26 DIAGNOSIS — I712 Thoracic aortic aneurysm, without rupture, unspecified: Secondary | ICD-10-CM

## 2016-05-26 DIAGNOSIS — I7121 Aneurysm of the ascending aorta, without rupture: Secondary | ICD-10-CM

## 2016-05-27 ENCOUNTER — Encounter: Payer: Self-pay | Admitting: Surgery

## 2016-05-27 NOTE — Progress Notes (Signed)
HPI:  The patient is a 66 year old gentleman who is well known to me from previous cardiac surgery. 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 This subsequently failed and was replaced with a [employer]. Jude mechanical valve by Dr. Linus Mako in 1988. 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 He underwent repair of the periprosthetic mitral leak on 01/28/2005 through a right thoracotomy approach and the posterior periprosthetic leak was repaired with pledgeted horizontal mattress sutures. 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 I saw him again in October 2011 at which time he mentioned that he developed weakness and anemia in June 2011 and underwent a GI workup that did not show any signs of GI blood loss. He was seen by urology for workup of his dark urine but no cause of hematuria could be found. I last saw him in March of 2012 when he presented with a 6 week history of progressive dyspnea on exertion and lower extremity swelling and had a laboratory workup consistent with hemolysis. 2-D echocardiogram followed by TEE showed severe mitral regurgitation that was felt to be coming from a peri-valvular leak. CT scan of the chest, abdomen, and pelvis at that time showed no change in the size of his 5 cm aortic root aneurysm. He was referred to Dr. Evelina Dun at [facility] and underwent repeat right thoracotomy with mitral valve replacement using a new mechanical valve prosthesis. I have continued to follow his aortic root aneurysm. I last saw him two years ago on  05/23/2014 and the aortic root aneurysm was unchanged at about 5.1 cm with a 4 cm fusiform ascending aortic aneurysm. His most recent echo on 04/19/2016 showed normal LV function with an EF of 50-55%. The mechanical MVR was functioning normally with no perivalvular leak. There was mild AI. The aortic root measured 44 mm and the ascending aorta 42 mm.   Current Outpatient Prescriptions  Medication Sig Dispense Refill  . enoxaparin (LOVENOX) 60 MG/0.6ML injection Inject 0.6 mLs (60 mg total) into the skin every 12 (twelve) hours. 20 Syringe 1  . Multiple Vitamin (MULTIVITAMIN WITH MINERALS) TABS tablet Take 1 tablet by mouth daily.    Marland Kitchen testosterone enanthate (DELATESTRYL) 200 MG/ML injection Inject into the muscle once a week. For IM use only    . warfarin (COUMADIN) 5 MG tablet Take by mouth as directed by coumadin clinic.    Marland Kitchen warfarin (COUMADIN) 5 MG tablet TAKE AS DIRECTED 45 tablet 3  . amoxicillin (AMOXIL) 500 MG capsule Take 4 capsules (2,000 mg total) by mouth as needed. Take 1 hour prior to dental procedures. (Patient not taking: Reported on 05/26/2016) 4 capsule 0   No current facility-administered medications for this visit.      Physical Exam: BP 130/79   Pulse 60   Resp 20   Ht 6\' 1"  (1.854 m)   Wt 151 lb 9.6 oz (68.8 kg)   SpO2 99% Comment: RA  BMI 20.00 kg/m  He looks well.  Cardiac exam shows a regular rate and rhythm with  a crisp mechanical valve click. There is a 1/6 systolic murmur at the apex  His lung exam is clear.  There is no peripheral edema.  Diagnostic Tests:  CLINICAL DATA:  66 year old male with a history of prior sternotomy and mitral valve repair. Known ascending aneurysm. Most recent MR AA 05/22/2014 with estimated aneurysm diameter of 4.0 cm. Most remote CTA comparison 04/18/2003 with measurement of the ascending aneurysm approximately 4.2 cm  EXAM: MRA CHEST WITH OR WITHOUT CONTRAST  TECHNIQUE: Angiographic images of the chest were obtained  using MRA technique without and with intravenous contrast.  CONTRAST:  13 cc MultiHance  COMPARISON:  Multiple prior studies, most remote CT angiogram 04/18/2003. Most recent MR comparison 05/22/2014, 05/23/2013, 04/24/2012  FINDINGS: VASCULAR  Aorta: Re- demonstration of ascending aortic aneurysm, with greatest diameter measured orthogonal to the flow channel approximately 4.2 cm. No dissection flap. No change in the diameter descending thoracic aorta. No significant luminal plaque.  Estimated diameter of the sino-tubular junction approximately 4.3 cm.  Evaluation of the annulus somewhat limited by artifact, however, estimated diameter of 3.2 cm.  Heart: Heart size unchanged. No pericardial fluid. Surgical changes of prior mitral annulus repair. Artifact secondary to sternal wires.  Pulmonary Arteries: Diameter of the main pulmonary artery measures 4.0 cm, unchanged from recent comparison studies, however, this has grown from the first comparison in 2005 when the diameter measured 3.2 cm. ,.  Other: None  NON-VASCULAR  Spinal cord: Unremarkable  Bones: Unremarkable musculoskeletal elements, incompletely imaged.  Pulmonary:  Unremarkable appearance of the lungs.  IMPRESSION: Re- demonstration of ascending aortic aneurysm, with diameter on today's study measuring approximately 4.2 cm which is essentially unchanged from the most remote CT study of 2005 when the measurement (measured contemporaneously today) was approximately 4.2 cm.  Re- demonstration of main pulmonary artery enlargement measuring 4.0 cm today, and 3.2 cm in 2005, enlarged over time. This may reflect developing pulmonary hypertension.  Surgical changes again of median sternotomy and mitral annuloplasty.  Signed,  Dulcy Fanny. Earleen Newport, DO  [facility]  [facility]   Electronically Signed   By: Corrie Mckusick D.O.   On:  05/26/2016 10:21   Impression:  His MRA today showed an aortic root aneurysm that measured 44-45 mm by my measurement and the ascending aorta is 42 mm. This has not changed appreciably since 2005. The measurement of the aortic root last year was 5.1 cm but that may have been falsely elevated. The MRA and echo measurements are consistent with each other. This has been stable over a long period of time and I think it would be fine to follow this with echo going forward since the largest area is in the root which is accurately assessed by echo. I don't think there is any indication for surgery at this time. He can continue to follow up with Dr. Tamala Julian and have periodic echocardiograms and I will be happy to see him back if the need arises.  Plan:  Follow up with Dr. Tamala Julian.   Gaye Pollack, MD [facility] [phone]

## 2016-07-08 ENCOUNTER — Other Ambulatory Visit: Payer: Self-pay | Admitting: Interventional Cardiology

## 2016-08-26 ENCOUNTER — Other Ambulatory Visit: Payer: Self-pay | Admitting: Interventional Cardiology

## 2016-08-26 NOTE — Telephone Encounter (Signed)
[facility] pharmacy requesting a refill on Amoxicillin 500 mg capsule. Would you like to refill this medicaiton? Please advise

## 2016-08-26 NOTE — Telephone Encounter (Signed)
That's fine as long as he is having a dental procedure done.

## 2017-02-02 NOTE — Progress Notes (Signed)
Cardiology Office Note    Date:  02/03/2017   ID:  Jesse, [name] 10-03-1950, MRN [id number]  PCP:  Hulan Fess, MD  Cardiologist: Sinclair Grooms, MD   Chief Complaint  Patient presents with  . Medication Problem  . Atrial Fibrillation    History of Present Illness:  [name] is a 66 y.o. 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 Other problems include hepatitis C, chronic atrial flutter, ascending aortic aneurysm, and chronic kidney disease stage III. He also has chronic anemia.  This is an annual follow-up for [name].  Over the past 12 months he has done well.  He had MR angio to follow aortic size and followed up with Dr. Cyndia Bent in February with noted aortic aneurysm measuring 4.5 cm.  Dr. Cyndia Bent felt that echo follow-up is reasonable at this time since there is correlation between the echo measurements and MRI.  He felt the aorta has been stable over time and he will see the patient as needed.   Past Medical History:  Diagnosis Date  . Allergy   . Aneurysm of ascending aorta (HCC)   . Atrial fibrillation ([place])   . BPH (benign prostatic hyperplasia)   . Colon polyps   . FHx: rheumatic heart disease   . H/O diplopia   . Hepatitis C   . Hypertension   . Hypogonadism male   . Mitral valve disease     Past Surgical History:  Procedure Laterality Date  . COLON SURGERY    . COLONOSCOPY    . COLONOSCOPY N/A 11/14/2012   Procedure: COLONOSCOPY;  Surgeon: Lear Ng, MD;  Location: [facility];  Service: Endoscopy;  Laterality: N/A;  . ESOPHAGOGASTRODUODENOSCOPY (EGD) WITH PROPOFOL N/A 05/08/2014   Procedure: ESOPHAGOGASTRODUODENOSCOPY (EGD) WITH PROPOFOL;  Surgeon: Lear Ng, MD;  Location: [facility];  Service: Endoscopy;  Laterality: N/A;  . MITRAL VALVE  REPLACEMENT      Current Medications: Outpatient Medications Prior to Visit  Medication Sig Dispense Refill  . amoxicillin (AMOXIL) 500 MG capsule TAKE FOUR CAPSULES BY MOUTH 1 HOUR BEFORE DENTAL APPOINTMENT 4 capsule 0  . Multiple Vitamin (MULTIVITAMIN WITH MINERALS) TABS tablet Take 1 tablet by mouth daily.    Marland Kitchen warfarin (COUMADIN) 5 MG tablet Take by mouth as directed by coumadin clinic.    Marland Kitchen enoxaparin (LOVENOX) 60 MG/0.6ML injection Inject 0.6 mLs (60 mg total) into the skin every 12 (twelve) hours. (Patient not taking: Reported on 02/03/2017) 20 Syringe 1  . testosterone enanthate (DELATESTRYL) 200 MG/ML injection Inject into the muscle once a week. For IM use only    . warfarin (COUMADIN) 5 MG tablet TAKE AS DIRECTED 45 tablet 3   No facility-administered medications prior to visit.      Allergies:   Ampicillin and Penicillins   Social History   Social History  . Marital status: Legally Separated    Spouse name: N/A  . Number of children: N/A  . Years of education: N/A   Social History Main Topics  . Smoking status: Current Some Day Smoker    Types: Cigars  . Smokeless tobacco: Never Used  . Alcohol use No  . Drug use: No  . Sexual activity: Not Asked   Other Topics Concern  . None   Social History Narrative  . None  Family History:  The patient's Family history is unknown by patient.   ROS:   Please see the history of present illness.    Fluctuating weight.  Slight increase in exertional dyspnea.  Intermittent lower extremity swelling.  Varying appetite with unexplained decreases and intake.  Not limited at work.  More short of breath when he does yard activities and brings up his garbage pail. All other systems reviewed and are negative.   PHYSICAL EXAM:   VS:  BP (!) 110/56 (BP Location: Left Arm)   Pulse 66   Ht 6\' 1"  (1.854 m)   Wt 153 lb (69.4 kg)   BMI 20.19 kg/m    GEN: Well nourished, well developed, in no acute distress  HEENT: normal    Neck: Moderate to severe JVD lying at 60 degrees.  JVD, carotid bruits, or masses Cardiac: IIRR; crisp mechanical valve closure sound S1.  No systolic or diastolic murmur, rubs, or gallops. Trace to 1+lower extremity edema. Respiratory:  clear to auscultation bilaterally, normal work of breathing GI: soft, nontender, nondistended, + BS MS: no deformity or atrophy.  Lichenification of skin in both ankles with 1+ edema. Skin: warm and dry, no rash Neuro:  Alert and Oriented x 3, Strength and sensation are intact Psych: euthymic mood, full affect  Wt Readings from Last 3 Encounters:  02/03/17 153 lb (69.4 kg)  05/26/16 151 lb 9.6 oz (68.8 kg)  02/20/16 151 lb 1.9 oz (68.5 kg)      Studies/Labs Reviewed:   EKG:  EKG atrial flutter at 260 bpm with 4-1 AV conduction and effective ventricular rate approximately 70 bpm.  Nonspecific ST abnormality.  Right axis deviation.  No significant change when compared to prior.  Recent Labs: 02/20/2016: BUN 17; Creat 1.38; Potassium 4.2; Sodium 139   Lipid Panel No results found for: CHOL, TRIG, HDL, CHOLHDL, VLDL, LDLCALC, LDLDIRECT  Additional studies/ records that were reviewed today include:  Echocardiogram January 2018: Study Conclusions   - Left ventricle: Septal hypokinesis The cavity size was mildly   dilated. Wall thickness was normal. Systolic function was normal.   The estimated ejection fraction was in the range of 50% to 55%.   The study is not technically sufficient to allow evaluation of LV   diastolic function. - Aortic valve: There was mild regurgitation. - Mitral valve: Normal appearing mechanical MVR with no peri   valvullar regurgitation noted. Valve area by continuity equation   (using LVOT flow): 1.53 cm^2. - Left atrium: The atrium was severely dilated. - Atrial septum: No defect or patent foramen ovale was identified.     ASSESSMENT:    1. Chronic atrial flutter (HCC)   2. Mechanical mitral valve with surgery x3    3. Chronic diastolic heart failure ([place])   4. Thoracic aortic aneurysm without rupture ([place])   5. Encounter for therapeutic drug monitoring      PLAN:  In order of problems listed above:  1. No change.  Continue chronic anticoagulation therapy. 2. Monitor for evidence of valve dysfunction/recurrent perivalvular leak.  Hemoglobin needs to be done intermittently to determine if recurrent hemolysis is present.  Recent echocardiogram performed less than 12 months ago revealed normal valve function. 3. Volume status is euvolemic.  No diuretic therapy needed at this point.   4. Aneurysm has been stable with most recent measurement of 4.5 cm by MRI.  Dr. Cyndia Bent has deferred follow-up to cardiology with intermittent echocardiography to look for root expansion.  There has been nice  correlation between echo and MR. 5. Monitor for bleeding complications.  Clinical follow-up in 1 year.  Add aerobic activity to build endurance.  Notify us if progressive swelling or shortness of breath.  Medication Adjustments/Labs and Tests Ordered: Current medicines are reviewed at length with the patient today.  Concerns regarding medicines are outlined above.  Medication changes, Labs and Tests ordered today are listed in the Patient Instructions below. Patient Instructions  Medication Instructions:  Your physician recommends that you continue on your current medications as directed. Please refer to the Current Medication list given to you today.  Labwork: None  Testing/Procedures: None  Follow-Up: Your physician wants you to follow-up in: 1 year with Dr. Tamala Julian.  You will receive a reminder letter in the mail two months in advance. If you don't receive a letter, please call our office to schedule the follow-up appointment.   Any Other Special Instructions Will Be Listed Below (If Applicable).  Your physician discussed the importance of regular exercise and recommended that you start or continue a regular  exercise program for good health at least 3 times per week.    If you need a refill on your cardiac medications before your next appointment, please call your pharmacy.      Signed, Sinclair Grooms, MD  02/03/2017 10:36 AM    [facility] [place], [place], [place]  [address] Phone: [phone]; Fax: [phone]

## 2017-02-03 ENCOUNTER — Ambulatory Visit (INDEPENDENT_AMBULATORY_CARE_PROVIDER_SITE_OTHER): Payer: 59 | Admitting: Interventional Cardiology

## 2017-02-03 ENCOUNTER — Encounter: Payer: Self-pay | Admitting: Interventional Cardiology

## 2017-02-03 VITALS — BP 110/56 | HR 66 | Ht 73.0 in | Wt 153.0 lb

## 2017-02-03 DIAGNOSIS — I712 Thoracic aortic aneurysm, without rupture, unspecified: Secondary | ICD-10-CM

## 2017-02-03 DIAGNOSIS — I4892 Unspecified atrial flutter: Secondary | ICD-10-CM | POA: Diagnosis not present

## 2017-02-03 DIAGNOSIS — I5032 Chronic diastolic (congestive) heart failure: Secondary | ICD-10-CM | POA: Diagnosis not present

## 2017-02-03 DIAGNOSIS — Z5181 Encounter for therapeutic drug level monitoring: Secondary | ICD-10-CM

## 2017-02-03 DIAGNOSIS — I059 Rheumatic mitral valve disease, unspecified: Secondary | ICD-10-CM | POA: Diagnosis not present

## 2017-02-03 NOTE — Patient Instructions (Signed)
Medication Instructions:  Your physician recommends that you continue on your current medications as directed. Please refer to the Current Medication list given to you today.  Labwork: None  Testing/Procedures: None  Follow-Up: Your physician wants you to follow-up in: 1 year with Dr. Tamala Julian.  You will receive a reminder letter in the mail two months in advance. If you don't receive a letter, please call our office to schedule the follow-up appointment.   Any Other Special Instructions Will Be Listed Below (If Applicable).  Your physician discussed the importance of regular exercise and recommended that you start or continue a regular exercise program for good health at least 3 times per week.    If you need a refill on your cardiac medications before your next appointment, please call your pharmacy.

## 2017-02-23 ENCOUNTER — Other Ambulatory Visit: Payer: Self-pay | Admitting: Gastroenterology

## 2017-02-23 DIAGNOSIS — K7460 Unspecified cirrhosis of liver: Secondary | ICD-10-CM

## 2017-03-22 ENCOUNTER — Other Ambulatory Visit: Payer: 59

## 2017-03-25 ENCOUNTER — Ambulatory Visit
Admission: RE | Admit: 2017-03-25 | Discharge: 2017-03-25 | Disposition: A | Discharge status: Home / Self Care | Payer: 59 | Source: Ambulatory Visit | Attending: Gastroenterology | Admitting: Gastroenterology

## 2017-03-25 DIAGNOSIS — K7460 Unspecified cirrhosis of liver: Secondary | ICD-10-CM

## 2017-05-27 ENCOUNTER — Ambulatory Visit (INDEPENDENT_AMBULATORY_CARE_PROVIDER_SITE_OTHER): Payer: 59

## 2017-05-27 ENCOUNTER — Other Ambulatory Visit: Payer: Self-pay | Admitting: Podiatry

## 2017-05-27 ENCOUNTER — Ambulatory Visit (INDEPENDENT_AMBULATORY_CARE_PROVIDER_SITE_OTHER): Payer: 59 | Admitting: Podiatry

## 2017-05-27 ENCOUNTER — Encounter: Payer: Self-pay | Admitting: Podiatry

## 2017-05-27 DIAGNOSIS — M7752 Other enthesopathy of left foot: Secondary | ICD-10-CM

## 2017-05-27 DIAGNOSIS — M7751 Other enthesopathy of right foot: Secondary | ICD-10-CM

## 2017-05-27 DIAGNOSIS — M79672 Pain in left foot: Secondary | ICD-10-CM | POA: Diagnosis not present

## 2017-05-27 DIAGNOSIS — Q828 Other specified congenital malformations of skin: Secondary | ICD-10-CM

## 2017-05-29 NOTE — Progress Notes (Signed)
Subjective:   Patient ID: [name], male   DOB: 67 y.o.   MRN: [id number]   HPI Patient presents stating this area still really bothers me and the orthotics only helped slightly.  Patient states that he walks on concrete floors and he feels like he is walking on the bone   ROS      Objective:  Physical Exam  Neurovascular status was intact with patient found to have a plantarflexed fourth metatarsal left with a protrusion of the bone but no keratotic tissue formation.  It is painful and makes it hard to walk     Assessment:  Prominent metatarsal creating chronic inflammation especially with cement floors that he needs to walk     Plan:  I reviewed condition and x-ray.  At this point I have recommended a new orthotic to try to disperse weight off this area and discussed long-term surgery to lift the metatarsal .  They closed their business mostly between Thanksgiving and Christmas and that would be the time for surgery so we will get a try orthotics again and see if we can avoid surgery.  Patient was seen by the ped orthotist who casted him for functional orthotics  X-rays indicate the lesion to be directly on the fourth metatarsals with marker noted and evaluated

## 2017-06-10 ENCOUNTER — Other Ambulatory Visit: Payer: Self-pay

## 2017-06-10 ENCOUNTER — Emergency Department (HOSPITAL_COMMUNITY)
Admission: EM | Admit: 2017-06-10 | Discharge: 2017-06-11 | Disposition: A | Discharge status: Home / Self Care | Payer: 59 | Attending: Emergency Medicine | Admitting: Emergency Medicine

## 2017-06-10 ENCOUNTER — Encounter (HOSPITAL_COMMUNITY): Payer: Self-pay

## 2017-06-10 DIAGNOSIS — R040 Epistaxis: Secondary | ICD-10-CM | POA: Diagnosis present

## 2017-06-10 DIAGNOSIS — I5032 Chronic diastolic (congestive) heart failure: Secondary | ICD-10-CM | POA: Diagnosis not present

## 2017-06-10 DIAGNOSIS — Z7901 Long term (current) use of anticoagulants: Secondary | ICD-10-CM | POA: Insufficient documentation

## 2017-06-10 DIAGNOSIS — Z952 Presence of prosthetic heart valve: Secondary | ICD-10-CM | POA: Insufficient documentation

## 2017-06-10 DIAGNOSIS — I110 Hypertensive heart disease with heart failure: Secondary | ICD-10-CM | POA: Diagnosis not present

## 2017-06-10 DIAGNOSIS — F17290 Nicotine dependence, other tobacco product, uncomplicated: Secondary | ICD-10-CM | POA: Diagnosis not present

## 2017-06-10 DIAGNOSIS — Z79899 Other long term (current) drug therapy: Secondary | ICD-10-CM | POA: Diagnosis not present

## 2017-06-10 LAB — CBC WITH DIFFERENTIAL/PLATELET
BASOS ABS: 0.1 10*3/uL (ref 0.0–0.1)
Basophils Relative: 1 %
Eosinophils Absolute: 0.7 10*3/uL (ref 0.0–0.7)
Eosinophils Relative: 10 %
HCT: 32.1 % — ABNORMAL LOW (ref 39.0–52.0)
HEMOGLOBIN: 10.2 g/dL — AB (ref 13.0–17.0)
LYMPHS PCT: 15 %
Lymphs Abs: 1.1 10*3/uL (ref 0.7–4.0)
MCH: 33 pg (ref 26.0–34.0)
MCHC: 31.8 g/dL (ref 30.0–36.0)
MCV: 103.9 fL — ABNORMAL HIGH (ref 78.0–100.0)
MONOS PCT: 9 %
Monocytes Absolute: 0.6 10*3/uL (ref 0.1–1.0)
Neutro Abs: 4.5 10*3/uL (ref 1.7–7.7)
Neutrophils Relative %: 65 %
Platelets: 115 10*3/uL — ABNORMAL LOW (ref 150–400)
RBC: 3.09 MIL/uL — AB (ref 4.22–5.81)
RDW: 22.9 % — ABNORMAL HIGH (ref 11.5–15.5)
WBC: 7 10*3/uL (ref 4.0–10.5)

## 2017-06-10 LAB — BASIC METABOLIC PANEL
ANION GAP: 7 (ref 5–15)
BUN: 27 mg/dL — ABNORMAL HIGH (ref 6–20)
CHLORIDE: 109 mmol/L (ref 101–111)
CO2: 24 mmol/L (ref 22–32)
Calcium: 9 mg/dL (ref 8.9–10.3)
Creatinine, Ser: 1.97 mg/dL — ABNORMAL HIGH (ref 0.61–1.24)
GFR calc non Af Amer: 34 mL/min — ABNORMAL LOW (ref >60)
GFR, EST AFRICAN AMERICAN: 39 mL/min — AB (ref >60)
Glucose, Bld: 101 mg/dL — ABNORMAL HIGH (ref 65–99)
POTASSIUM: 4 mmol/L (ref 3.5–5.1)
Sodium: 140 mmol/L (ref 135–145)

## 2017-06-10 LAB — PROTIME-INR
INR: 2.58
Prothrombin Time: 27.5 seconds — ABNORMAL HIGH (ref 11.4–15.2)

## 2017-06-10 MED ORDER — OXYMETAZOLINE HCL 0.05 % NA SOLN
1.0000 | Freq: Once | NASAL | Status: AC
  Administered 2017-06-10: 1 via NASAL
  Filled 2017-06-10: qty 15

## 2017-06-10 MED ORDER — SILVER NITRATE-POT NITRATE 75-25 % EX MISC
1.0000 | Freq: Once | CUTANEOUS | Status: AC
  Administered 2017-06-10: 1 via TOPICAL
  Filled 2017-06-10: qty 1

## 2017-06-10 MED ORDER — OXYCODONE-ACETAMINOPHEN 5-325 MG PO TABS
2.0000 | ORAL_TABLET | Freq: Once | ORAL | Status: AC
  Administered 2017-06-10: 2 via ORAL
  Filled 2017-06-10: qty 2

## 2017-06-10 MED ORDER — ONDANSETRON HCL 4 MG/2ML IJ SOLN
4.0000 mg | Freq: Once | INTRAMUSCULAR | Status: DC
  Filled 2017-06-10: qty 2

## 2017-06-10 MED ORDER — LIDOCAINE-EPINEPHRINE (PF) 2 %-1:200000 IJ SOLN
20.0000 mL | Freq: Once | INTRAMUSCULAR | Status: AC
  Administered 2017-06-10: 20 mL
  Filled 2017-06-10: qty 20

## 2017-06-10 MED ORDER — SODIUM CHLORIDE 0.9 % IV BOLUS (SEPSIS): 500.0000 mL | Freq: Once | INTRAVENOUS | Status: DC

## 2017-06-10 MED ORDER — FENTANYL CITRATE (PF) 100 MCG/2ML IJ SOLN
50.0000 ug | Freq: Once | INTRAMUSCULAR | Status: DC
  Filled 2017-06-10: qty 2

## 2017-06-10 NOTE — ED Provider Notes (Signed)
[facility] DEPT Provider Note   CSN: [id number] Arrival date & time: 06/10/17  1520     History   Chief Complaint Chief Complaint  Patient presents with  . Epistaxis    HPI [name] is a 67 y.o. male who presents to the ER for epistaxis.  He has a past medical history of ascending aortic aneurysm, A. fib and is on Coumadin.  Patient states that he has had previous episode of nosebleed about 3 years ago that required nasal packing.  He states that he has been supratherapeutic on his INR and had to cut back his dosage recently.  He denies any traumas to the nose.  Complains of swallowing blood and clots.  His nosebleed started about 6 hours ago.  He tried Afrin and constant pressure without relief of his symptoms.  HPI  Past Medical History:  Diagnosis Date  . Allergy   . Aneurysm of ascending aorta (HCC)   . Atrial fibrillation ([place])   . BPH (benign prostatic hyperplasia)   . Colon polyps   . FHx: rheumatic heart disease   . H/O diplopia   . Hepatitis C   . Hypertension   . Hypogonadism male   . Mitral valve disease     Patient Active Problem List   Diagnosis Date Noted  . Cirrhosis ([place]) 05/08/2014  . Chronic atrial flutter ([place]) 06/05/2013  . Hepatitis C 06/05/2013  . Chronic diastolic heart failure ([place]) 06/05/2013  . Encounter for therapeutic drug monitoring 04/30/2013  . 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 Heart valve replaced by other means 01/08/2013  . HCV (hepatitis C virus) 01/01/2013  . History of colonic polyps 11/14/2012  . Thoracic aortic aneurysm ([place]) 05/02/2012    Past Surgical History:  Procedure Laterality Date  . COLON SURGERY    . COLONOSCOPY    . COLONOSCOPY N/A 11/14/2012   Procedure: COLONOSCOPY;  Surgeon: Lear Ng, MD;  Location: [facility];  Service: Endoscopy;  Laterality: N/A;  . ESOPHAGOGASTRODUODENOSCOPY (EGD) WITH PROPOFOL N/A 05/08/2014   Procedure:  ESOPHAGOGASTRODUODENOSCOPY (EGD) WITH PROPOFOL;  Surgeon: Lear Ng, MD;  Location: [facility];  Service: Endoscopy;  Laterality: N/A;  . MITRAL VALVE REPLACEMENT         Home Medications    Prior to Admission medications   Medication Sig Start Date End Date Taking? Authorizing Provider  amoxicillin (AMOXIL) 500 MG capsule TAKE FOUR CAPSULES BY MOUTH 1 HOUR BEFORE DENTAL APPOINTMENT 08/26/16   Belva Crome, MD  ANDROGEL 20.25 MG/1.25GM (1.62%) GEL Apply 1 Pump topically daily. 01/19/17   [provider]  IRON PO Take 1 tablet by mouth every other day. 07/30/10   [provider]  Multiple Vitamin (MULTIVITAMIN WITH MINERALS) TABS tablet Take 1 tablet by mouth daily.    [provider]  tadalafil (CIALIS) 5 MG tablet Take 5 mg by mouth daily as needed for erectile dysfunction.    [provider]  warfarin (COUMADIN) 5 MG tablet Take by mouth as directed by coumadin clinic. 07/09/10   [provider]    Family History Family History  Family history unknown: Yes    Social History Social History   Tobacco Use  . Smoking status: Current Some Day Smoker    Types: Cigars  . Smokeless tobacco: Never Used  Substance Use Topics  . Alcohol use: No  . Drug use: No     Allergies   Ampicillin and Penicillins   Review  of Systems Review of Systems  Ten systems reviewed and are negative for acute change, except as noted in the HPI.   Physical Exam Updated Vital Signs BP 121/69 (BP Location: Right Arm)   Pulse 62   Temp 98 F (36.7 C) (Oral)   Resp 18   SpO2 97%   Physical Exam   ED Treatments / Results  Labs (all labs ordered are listed, but only abnormal results are displayed) Labs Reviewed - No data to display  EKG  EKG Interpretation None       Radiology No results found.  Procedures .Epistaxis Management Date/Time: 06/10/2017 9:56 PM Performed by: Margarita Mail, PA-C Authorized by: Margarita Mail,  PA-C   Consent:    Consent obtained:  Verbal   Consent given by:  Patient Anesthesia (see MAR for exact dosages):    Anesthesia method:  Topical application   Topical anesthesia: lidocaine with epinephrine. Procedure details:    Treatment site:  R posterior   Treatment method:  Nasal balloon and merocel sponge   Treatment complexity:  Extensive   Recurrence of recent bleed: persistent.   Post-procedure details:    Assessment:  No improvement Comments:     Patient with persistent nosebleed.  Patient has been here for almost 6 hours.  We have tried Afrin, Afrin packing, packing with gauze soaked with lidocaine and epinephrine, 5 cm nasal balloon filled with 7 cc of air pressure, and finally 8 cm Merocel packing which is still in place with persistent bleeding around the packing, through the left naris and down the back of the throat.   (including critical care time)  Medications Ordered in ED Medications  oxymetazoline (AFRIN) 0.05 % nasal spray 1 spray (not administered)     Initial Impression / Assessment and Plan / ED Course  I have reviewed the triage vital signs and the nursing notes.  Pertinent labs & imaging results that were available during my care of the patient were reviewed by me and considered in my medical decision making (see chart for details).  Clinical Course as of Jun 11 21  Fri Jun 10, 2017  1859 INR: 2.58 [MS]  2141 Platelets: (!) 115 [AH]  2141 Smear Review: MORPHOLOGY UNREMARKABLE [AH]  2141 Smear Review: MORPHOLOGY UNREMARKABLE [AH]  2206 Unable to stop bleeding. I have placed a call to ENT.  [AH]  2214 Spoke with Dr. Benjamine Mola who asks that we use the 10cm merocel  [AH]    Clinical Course User Index [AH] Margarita Mail, PA-C [MS] Scheeler, Carola Rhine, Student-PA      Final Clinical Impressions(s) / ED Diagnoses   Final diagnoses:  Right-sided epistaxis  Patient with epistaxis.  We allowed the 8 cm Merocel packing to sit for long time and  eventually bleeding did resolve.  There is no oozing of blood..  Patient patient does not feel dizzy or lightheaded and has ambulated to the bathroom.  Patient given pain medication.  He will be discharged with Percocet is advised to follow on Monday with Dr. Benjamine Mola discussed return precautions the patient appears appropriate for discharge at this time  ED Discharge Orders    None       Margarita Mail, PA-C 06/11/17 Gwyneth Sprout, MD 06/11/17 [phone]

## 2017-06-10 NOTE — ED Triage Notes (Signed)
Pt reports a nose bleed that started around noon. Hx of same about 3 years ago.

## 2017-06-10 NOTE — ED Notes (Signed)
2 nurses attempted x2 each for IV, unsuccessful. EDPA made aware.

## 2017-06-10 NOTE — ED Notes (Signed)
PA at bedside.

## 2017-06-11 MED ORDER — OXYCODONE-ACETAMINOPHEN 5-325 MG PO TABS: 2.0000 | ORAL_TABLET | ORAL | 0 refills | Status: DC | PRN

## 2017-06-11 NOTE — Discharge Instructions (Signed)
Nosebleeds commonly recur.  If your nose starts bleeding again and doesn't stop after 10-15 minutes of constant pressure squeezing the soft part of your nose, then re-apply the direct pressure and return to the ED. °Return also immediately to the nearest ED if you develop chest pain, shortness of breath, dizziness or fainting, severe bleeding, or other concerns. ° °Nasal Hygiene °The nose has many positive effects on the air you breathe in that you may not °be aware of. °- Temperature regulation °- Filtration and removal of particulate matter °- Humidification °- Defense against infections °There are several things you can do to help keep your nose healthy. Foremost is °nasal hygiene. This will help with your nose's natural function and keep it moist °and healthy. °Techniques to accomplish this are outlined below. These will help with nasal °dryness, nasal crusting, and nose bleeds. They also assist with clearing thick °mucus that cause you to blow your nose frequently and may be associated with °thick postnasal drip. ° °1. Use nasal saline daily. You can buy small bottles of this over-the-counter at °the drug store or grocery store. Some brand names are: Ocean, Sea Mist, Ayr. °Apply 2-3 sprays each nostril several times a day. If your nose feels dry or have °had recent nasal surgery, try to use it every couple of hours. There is no °medicine in it so it can be used as often as you like. Do NOT use the sprays °containing decongestants. °The appropriate way to apply nasal sprays: Place the nozzle just inside your °nostril and point it towards the corner of your eye. Often, it is helpful to use the °right-hand to spray into the left nasal cavity and use the left-hand to spray into °the right side. ° °2. Use nasal saline irrigations and flushing.SinuCleanse, °Simply Saline, Ayr. Use this 1-2 times a day. °We can also provide you with a recipe to use at home. Just ask us. °To prevent reintroducing bacteria back into  your nose, please keep your °irrigation equipment clean and dry between uses. Throw away and replace °reusable irrigation equipment every 3 weeks. °  °3. Use Vaseline petroleum jelly or Aquaphor. You can apply this gently to each °nostril 2-3 times a day to promote moisturization for your nose. You may also °use triple antibiotic ointment such as Neosporin or Bacitracin. These can all be °bought over-the-counter. ° °4. Consider other nasal emollients. A few preparations are available over-thecounter. °Ponaris, Nose Better, Pretz. Ask your pharmacist what is available. Also, °some nasal saline sprays have additives such as aloe and these are helpful. ° °5. Consider using a humidifier at home. If your nose feels dry and/or you have °frequent nose bleeds, you can buy a humidifier for your home. Be cautious in °using these if you have mold allergies. ° °6. Avoid excessive manual manipulation of your nose and nostrils. Frequent °rubbing of your nostrils and the passing of tissues or fingers in your nostrils may °aggravate nasal irritation from dryness and nose bleeds.  ° °

## 2017-06-16 ENCOUNTER — Ambulatory Visit (INDEPENDENT_AMBULATORY_CARE_PROVIDER_SITE_OTHER): Payer: 59 | Admitting: Otolaryngology

## 2017-06-16 DIAGNOSIS — R040 Epistaxis: Secondary | ICD-10-CM

## 2017-06-20 ENCOUNTER — Other Ambulatory Visit: Payer: 59 | Admitting: Orthotics

## 2017-06-23 ENCOUNTER — Ambulatory Visit (INDEPENDENT_AMBULATORY_CARE_PROVIDER_SITE_OTHER): Payer: 59 | Admitting: Orthotics

## 2017-06-23 DIAGNOSIS — M79672 Pain in left foot: Secondary | ICD-10-CM

## 2017-06-23 DIAGNOSIS — M779 Enthesopathy, unspecified: Secondary | ICD-10-CM

## 2017-06-23 NOTE — Progress Notes (Signed)
Patient came in today to pick up custom made foot orthotics.  The goals were accomplished and the patient reported no dissatisfaction with said orthotics.  Patient was advised of breakin period and how to report any issues. 

## 2017-06-29 ENCOUNTER — Other Ambulatory Visit: Payer: Self-pay | Admitting: Interventional Cardiology

## 2017-06-30 NOTE — Telephone Encounter (Signed)
This is fine to fill.  Thanks!

## 2017-07-01 ENCOUNTER — Encounter: Payer: Self-pay | Admitting: *Deleted

## 2017-07-01 ENCOUNTER — Encounter: Payer: Self-pay | Admitting: Cardiology

## 2017-07-01 ENCOUNTER — Ambulatory Visit (INDEPENDENT_AMBULATORY_CARE_PROVIDER_SITE_OTHER): Payer: 59 | Admitting: Cardiology

## 2017-07-01 VITALS — BP 122/56 | HR 66 | Ht 73.0 in | Wt 147.0 lb

## 2017-07-01 DIAGNOSIS — I4892 Unspecified atrial flutter: Secondary | ICD-10-CM | POA: Diagnosis not present

## 2017-07-01 DIAGNOSIS — Z5181 Encounter for therapeutic drug level monitoring: Secondary | ICD-10-CM | POA: Diagnosis not present

## 2017-07-01 DIAGNOSIS — I712 Thoracic aortic aneurysm, without rupture, unspecified: Secondary | ICD-10-CM

## 2017-07-01 DIAGNOSIS — Z7901 Long term (current) use of anticoagulants: Secondary | ICD-10-CM

## 2017-07-01 DIAGNOSIS — I5032 Chronic diastolic (congestive) heart failure: Secondary | ICD-10-CM

## 2017-07-01 DIAGNOSIS — R0602 Shortness of breath: Secondary | ICD-10-CM

## 2017-07-01 DIAGNOSIS — Z952 Presence of prosthetic heart valve: Secondary | ICD-10-CM

## 2017-07-01 NOTE — Progress Notes (Signed)
Cardiology Office Note   Date:  07/01/2017   ID:  Jesse, [name] 1950/07/10, MRN [id number]  PCP:  Hulan Fess, MD  Cardiologist: Dr. Tamala Julian    Chief Complaint  Patient presents with  . Edema      History of Present Illness: [name] is a 67 y.o. male who presents for lower ext edema after a nose bleed, Lt > Rt.   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 Other problems include hepatitis C, chronic atrial flutter, ascending aortic aneurysm, and chronic kidney disease stage III. He also has chronic anemia.  He had MR angio to follow aortic size and followed up with Dr. Cyndia [name] in February 2018 with noted aortic aneurysm measuring 4.5 cm.  Dr. Cyndia [name] felt that echo follow-up is reasonable at this time since there is correlation between the echo measurements and MRI.  He felt the aorta has been stable over time and he will see the patient as needed.    Pt had been doing well until developed epistaxis on Rt .  Was seen in ER 06/10/17 had afrin, and Merocel packing.  He followed with ENT Dr.Teoh and area cauterized.  HGB 10.2,  plts 115K  Cr 1.97, INR 2.58   Not long after he developed Lt leg swelling and some in Rt and PCP added diuretic-- do not know name.  He is also SOB. His leg is worse with working at night.   Labs from yesterday INR 3.4,  BNP 305, hgb 9.8 plts 163, BUN 48, Cr 1.82, ASR 85. ALT 31 K+ 4.4 EKG  A flutter and no acute changes.   CXR was done - do not have results.    Today with some swelling L>R no chest pain, does have SOB at times.  No blood in stool or urine.   Past Medical History:  Diagnosis Date  . Allergy   . Aneurysm of ascending aorta (HCC)   . Atrial fibrillation ([place])   . BPH (benign prostatic hyperplasia)   . Colon polyps   . FHx: rheumatic heart disease   . H/O  diplopia   . Hepatitis C   . Hypertension   . Hypogonadism male   . Mitral valve disease     Past Surgical History:  Procedure Laterality Date  . COLON SURGERY    . COLONOSCOPY    . COLONOSCOPY N/A 11/14/2012   Procedure: COLONOSCOPY;  Surgeon: Jesse Ng, MD;  Location: [facility];  Service: Endoscopy;  Laterality: N/A;  . ESOPHAGOGASTRODUODENOSCOPY (EGD) WITH PROPOFOL N/A 05/08/2014   Procedure: ESOPHAGOGASTRODUODENOSCOPY (EGD) WITH PROPOFOL;  Surgeon: Jesse Ng, MD;  Location: [facility];  Service: Endoscopy;  Laterality: N/A;  . MITRAL VALVE REPLACEMENT       Current Outpatient Medications  Medication Sig Dispense Refill  . IRON PO Take 1 tablet by mouth every other day.    . Multiple Vitamin (MULTIVITAMIN WITH MINERALS) TABS tablet Take 1 tablet by mouth daily.    . tadalafil (CIALIS) 5 MG tablet Take 5 mg by mouth daily as needed for erectile dysfunction.    Marland Kitchen testosterone cypionate (DEPOTESTOSTERONE CYPIONATE) 200 MG/ML injection Inject 200 mg into the muscle every Saturday.    . warfarin (COUMADIN) 5 MG tablet Take 5 mg by mouth See admin instructions. Take 5 mg every day except Friday take 7.5  mg    . amoxicillin (AMOXIL) 500 MG capsule TAKE FOUR CAPSULES BY MOUTH 1 HOUR BEFORE DENTAL APPOINTMENT (Patient not taking: Reported on 07/01/2017) 4 capsule 0   No current facility-administered medications for this visit.     Allergies:   Penicillins    Social History:  The patient  reports that he has been smoking cigars.  He has never used smokeless tobacco. He reports that he does not drink alcohol or use drugs.   Family History:  The patient's Family history is unknown by patient.    ROS:  General:no colds or fevers, + weight loss Skin:no rashes or ulcers HEENT:no blurred vision, no congestion, no further nose bleed CV:see HPI PUL:see HPI GI:no diarrhea constipation or melena, no indigestion GU:no hematuria, no dysuria MS:no joint pain, no  claudication Neuro:no syncope, no lightheadedness Endo:no diabetes, no thyroid disease   Wt Readings from Last 3 Encounters:  07/01/17 147 lb (66.7 kg)  02/03/17 153 lb (69.4 kg)  05/26/16 151 lb 9.6 oz (68.8 kg)     PHYSICAL EXAM: VS:  BP (!) 122/56   Pulse 66   Ht 6\' 1"  (1.854 m)   Wt 147 lb (66.7 kg)   SpO2 98%   BMI 19.39 kg/m  , BMI Body mass index is 19.39 kg/m. General:Pleasant affect, NAD Skin:Warm and dry, brisk capillary refill HEENT:normocephalic, sclera clear, mucus membranes moist Neck:supple, no JVD, no bruits  Heart:S1S2 RRR with soft diastolic murmur, no gallup, rub or click Lungs:clear without rales, rhonchi, or wheezes DJS:HFWY, non tender, + BS, do not palpate liver spleen or masses Ext:1+ lower lt ext edema, and tr to 1+ on Rt., 2+ radial pulses Neuro:alert and oriented X 3, MAE, follows commands, + facial symmetry    EKG:  EKG is ordered today. The ekg ordered today demonstrates  Reviewed EKG from PCP office and a fib rate controlled no acute changes.  Recent Labs: 06/10/2017: BUN 27; Creatinine, Ser 1.97; Hemoglobin 10.2; Platelets 115; Potassium 4.0; Sodium 140   See above. Lipid Panel No results found for: CHOL, TRIG, HDL, CHOLHDL, VLDL, LDLCALC, LDLDIRECT     Other studies Reviewed: Additional studies/ records that were reviewed today include: . Echocardiogram January 2018: Study Conclusions  - Left ventricle: Septal hypokinesis The cavity size was mildly dilated. Wall thickness was normal. Systolic function was normal. The estimated ejection fraction was in the range of 50% to 55%. The study is not technically sufficient to allow evaluation of LV diastolic function. - Aortic valve: There was mild regurgitation. - Mitral valve: Normal appearing mechanical MVR with no peri valvullar regurgitation noted. Valve area by continuity equation (using LVOT flow): 1.53 cm^2. - Left atrium: The atrium was severely dilated. - Atrial  septum: No defect or patent foramen ovale was identified.      ASSESSMENT AND PLAN:  1.  Mechanical mitral valve with surgery X 3  --now with increased lower ext edema L>R will check echo to eval.  --At time of pt visit I did not have labs or EKG, have reviewed, will need a copy of office visit note to see diuretic and CXR results.   2.  Chronic a flutter stable - no change in EKG  3.  Thoracic aortic aneurysm without rupture will chick with echo  4.  Chronic diastolic HF BP controlled.    5.  Anticoagulation stable on coumadin, I have not seen INR < 2.4 doubt edema is DVT  6.   Thrombocytopenia in ER normal on recent check.  Current medicines are reviewed with the patient today.  The patient Has no concerns regarding medicines.  The following changes have been made:  See above Labs/ tests ordered today include:see above  Disposition:   FU:  see above  Signed, Cecilie Kicks, NP  07/01/2017 10:55 PM    [facility], [place] [place], [place] Phone: [phone]; Fax: [phone]

## 2017-07-01 NOTE — Patient Instructions (Signed)
Medication Instructions:  Your physician recommends that you continue on your current medications as directed. Please refer to the Current Medication list given to you today.   Labwork: None ordered  Testing/Procedures: Your physician has requested that you have an echocardiogram 07/05/17.  ARRIVE AT 1:45 . Echocardiography is a painless test that uses sound waves to create images of your heart. It provides your doctor with information about the size and shape of your heart and how well your heart's chambers and valves are working. This procedure takes approximately one hour. There are no restrictions for this procedure.    Follow-Up: Your physician recommends that you schedule a follow-up appointment in: 07/15/17 ARRIVE AT 1:45 TO SEE Cecilie Kicks, NP   Any Other Special Instructions Will Be Listed Below (If Applicable). Call us if your swelling is not any better on Monday.    If you need a refill on your cardiac medications before your next appointment, please call your pharmacy.

## 2017-07-05 ENCOUNTER — Other Ambulatory Visit: Payer: Self-pay

## 2017-07-05 ENCOUNTER — Ambulatory Visit (HOSPITAL_COMMUNITY): Payer: 59 | Attending: Cardiovascular Disease

## 2017-07-05 DIAGNOSIS — I712 Thoracic aortic aneurysm, without rupture: Secondary | ICD-10-CM | POA: Diagnosis not present

## 2017-07-05 DIAGNOSIS — Z952 Presence of prosthetic heart valve: Secondary | ICD-10-CM

## 2017-07-05 DIAGNOSIS — Z953 Presence of xenogenic heart valve: Secondary | ICD-10-CM | POA: Diagnosis not present

## 2017-07-05 DIAGNOSIS — I082 Rheumatic disorders of both aortic and tricuspid valves: Secondary | ICD-10-CM | POA: Diagnosis not present

## 2017-07-05 DIAGNOSIS — I509 Heart failure, unspecified: Secondary | ICD-10-CM | POA: Insufficient documentation

## 2017-07-05 DIAGNOSIS — I4892 Unspecified atrial flutter: Secondary | ICD-10-CM | POA: Diagnosis not present

## 2017-07-05 DIAGNOSIS — R0602 Shortness of breath: Secondary | ICD-10-CM | POA: Diagnosis not present

## 2017-07-08 ENCOUNTER — Telehealth: Payer: Self-pay | Admitting: Cardiology

## 2017-07-08 NOTE — Telephone Encounter (Signed)
Great keep follow up appt

## 2017-07-08 NOTE — Telephone Encounter (Signed)
New Message    Patient is returning calls in reference to echocardiogram results. Please call to discuss.

## 2017-07-08 NOTE — Telephone Encounter (Signed)
Jesse Serge, NP  [name], RMA        Echo is stable, valve is stable  Is he ok back at work ?  -------------------------------------------------------------------------------------- I spoke with pt and reviewed echo results with him. He reports he is feeling pretty good. Swelling has gone down and shortness of breath is better.

## 2017-07-12 ENCOUNTER — Telehealth: Payer: Self-pay | Admitting: *Deleted

## 2017-07-12 NOTE — Telephone Encounter (Signed)
-----   Message from Isaiah Serge, NP sent at 07/04/2017  1:18 PM EDT ----- Can you let pt know I saw EKG and agree no changes with PCP, labs stable and I did not see CXR, but his PCP should call if problem.  Pt mentioned diuretic can you ask him what it was and if still taking?  I understood him to say he was but not in chart.Thanks,  Otherwise Echo as ordered.

## 2017-07-12 NOTE — Telephone Encounter (Signed)
Spoke with pt. He states that Dr. Rex Kras has him on Lasix 20 mg daily, but pt states he only takes it when his leg swells, in which, he has been taking it for the last 5 days and his left leg, only, is still swollen and hasn't went down much at all.  I advised pt I would send this message and call him back if there was any further recommendations.  Pt thanked me for the call.

## 2017-07-15 ENCOUNTER — Encounter: Payer: Self-pay | Admitting: Cardiology

## 2017-07-15 ENCOUNTER — Ambulatory Visit (INDEPENDENT_AMBULATORY_CARE_PROVIDER_SITE_OTHER): Payer: 59 | Admitting: Cardiology

## 2017-07-15 VITALS — BP 112/66 | HR 63 | Ht 73.0 in | Wt 152.8 lb

## 2017-07-15 DIAGNOSIS — R609 Edema, unspecified: Secondary | ICD-10-CM | POA: Diagnosis not present

## 2017-07-15 DIAGNOSIS — I4892 Unspecified atrial flutter: Secondary | ICD-10-CM

## 2017-07-15 DIAGNOSIS — I059 Rheumatic mitral valve disease, unspecified: Secondary | ICD-10-CM

## 2017-07-15 DIAGNOSIS — Z7901 Long term (current) use of anticoagulants: Secondary | ICD-10-CM | POA: Diagnosis not present

## 2017-07-15 DIAGNOSIS — Z5181 Encounter for therapeutic drug level monitoring: Secondary | ICD-10-CM | POA: Diagnosis not present

## 2017-07-15 NOTE — Progress Notes (Signed)
Cardiology Office Note   Date:  07/15/2017   ID:  Rube, Sanchez 06-17-1950, MRN [id number]  PCP:  Hulan Fess, MD  Cardiologist:  Dr. Tamala Julian    Chief Complaint  Patient presents with  . Leg Swelling      History of Present Illness: [name] is a 67 y.o. male who presents for edema.  Edema continues, Lt > Rt usually happens this time of year.    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 Other problems include hepatitis C, chronic atrial flutter, ascending aortic aneurysm, and chronic kidney disease stage III. He also has chronic anemia.  He had MR angio to follow aortic size and followed up with Dr. Earlean Shawl February 2018 with noted aortic aneurysm measuring 4.5 cm. Dr. Irene Shipper that echo follow-up is reasonable at this time since there is correlation between the echo measurements and MRI. He felt the aorta has been stable over time and he will see the patient as needed.    Pt had been doing well until developed epistaxis on Rt .  Was seen in ER 06/10/17 had afrin, and Merocel packing.  He followed with ENT Dr.Teoh and area cauterized.  HGB 10.2,  plts 115K  Cr 1.97, INR 2.58   Not long after he developed Lt leg swelling and some in Rt and PCP added diuretic-- do not know name.  He is also SOB. His leg is worse with working at night.   Labs from yesterday INR 3.4,  BNP 305, hgb 9.8 plts 163, BUN 48, Cr 1.82, ASR 85. ALT 31 K+ 4.4 EKG  A flutter and no acute changes.   CXR was done - do not have results.    On last visit 07/01/17  He had some swelling L>R no chest pain, does have SOB at times.  His PCP had placed him on lasix 20 mg daily.  I kept him out of work for 1-2 nights which helped.  We did an echo and no change in his valve.   Today overall edema has improved but continues.  His Lt leg  is always more swollen than Rt.  He is watching the salt in his diet.  No SOB.   Past Medical History:  Diagnosis Date  . Allergy   . Aneurysm of ascending aorta (HCC)   . Atrial fibrillation ([place])   . BPH (benign prostatic hyperplasia)   . Colon polyps   . FHx: rheumatic heart disease   . H/O diplopia   . Hepatitis C   . Hypertension   . Hypogonadism male   . Mitral valve disease     Past Surgical History:  Procedure Laterality Date  . COLON SURGERY    . COLONOSCOPY    . COLONOSCOPY N/A 11/14/2012   Procedure: COLONOSCOPY;  Surgeon: Lear Ng, MD;  Location: [facility];  Service: Endoscopy;  Laterality: N/A;  . ESOPHAGOGASTRODUODENOSCOPY (EGD) WITH PROPOFOL N/A 05/08/2014   Procedure: ESOPHAGOGASTRODUODENOSCOPY (EGD) WITH PROPOFOL;  Surgeon: Lear Ng, MD;  Location: [facility];  Service: Endoscopy;  Laterality: N/A;  . MITRAL VALVE REPLACEMENT       Current Outpatient Medications  Medication Sig Dispense Refill  . amoxicillin (AMOXIL) 500 MG capsule TAKE FOUR CAPSULES BY MOUTH 1 HOUR BEFORE DENTAL APPOINTMENT 4 capsule 0  . furosemide (LASIX) 20 MG tablet Take 20  mg by mouth daily.     . IRON PO Take 1 tablet by mouth every other day.    . Multiple Vitamin (MULTIVITAMIN WITH MINERALS) TABS tablet Take 1 tablet by mouth daily.    . tadalafil (CIALIS) 5 MG tablet Take 5 mg by mouth daily as needed for erectile dysfunction.    Marland Kitchen testosterone cypionate (DEPOTESTOSTERONE CYPIONATE) 200 MG/ML injection Inject 200 mg into the muscle every Saturday.    . warfarin (COUMADIN) 5 MG tablet Take 5 mg by mouth See admin instructions. Take 5 mg every day except Friday take 7.5 mg     No current facility-administered medications for this visit.     Allergies:   Penicillins    Social History:  The patient  reports that he has been smoking cigars.  He has never used smokeless tobacco. He reports that he does not drink alcohol or use drugs.   Family History:  The  patient's Family history is unknown by patient.  Unknown by Pt    ROS:  General:no colds or fevers, no weight changes Skin:no rashes or ulcers HEENT:no blurred vision, no congestion CV:see HPI PUL:see HPI GI:no diarrhea constipation or melena, no indigestion GU:no hematuria, no dysuria   Wt Readings from Last 3 Encounters:  07/15/17 152 lb 12.8 oz (69.3 kg)  07/01/17 147 lb (66.7 kg)  02/03/17 153 lb (69.4 kg)     PHYSICAL EXAM: VS:  BP 112/66   Pulse 63   Ht 6\' 1"  (1.854 m)   Wt 152 lb 12.8 oz (69.3 kg)   SpO2 94%   BMI 20.16 kg/m  , BMI Body mass index is 20.16 kg/m. General:Pleasant affect, NAD Skin:Warm and dry, brisk capillary refill HEENT:normocephalic, sclera clear, mucus membranes moist Neck:supple, no JVD, no bruits  Heart:mildly irregular without murmur, gallup, rub or click Lungs:clear without rales, rhonchi, or wheezes NIO:EVOJ, non tender, + BS, do not palpate liver spleen or masses Ext:2+ lt lower ext edema and 1 + Rt.  2+ radial pulses Neuro:alert and oriented X 3, MAE, follows commands, + facial symmetry    EKG:  EKG is not ordered today.    Recent Labs: 06/10/2017: BUN 27; Creatinine, Ser 1.97; Hemoglobin 10.2; Platelets 115; Potassium 4.0; Sodium 140    Lipid Panel No results found for: CHOL, TRIG, HDL, CHOLHDL, VLDL, LDLCALC, LDLDIRECT     Other studies Reviewed: Additional studies/ records that were reviewed today include:  Echo 07/05/17  Study Conclusions  - Left ventricle: The cavity size was normal. There was mild   concentric hypertrophy. Systolic function was normal. The   estimated ejection fraction was in the range of 50% to 55%. Wall   motion was normal; there were no regional wall motion   abnormalities. The study is not technically sufficient to allow   evaluation of LV diastolic function. - Aortic valve: Transvalvular velocity was within the normal range.   There was no stenosis. There was mild regurgitation.   Regurgitation  pressure half-time: 674 ms. - Mitral valve: A mechanical prosthesis was present. Transvalvular   velocity was within the normal range. There was no evidence for   stenosis. There was no regurgitation. Pressure half-time: 78 ms.   Valve area by continuity equation (using LVOT flow): 1.71 cm^2. - Right ventricle: The cavity size was mildly dilated. Wall   thickness was normal. Systolic function was moderately reduced. - Atrial septum: No defect or patent foramen ovale was identified. - Tricuspid valve: There was mild regurgitation. - Pulmonary arteries:  Systolic pressure was within the normal   range. PA peak pressure: 38 mm Hg (S).  ASSESSMENT AND PLAN:  1.  Lower ext edema , improved somewhat, will increase lasix to 20 daily except Monday and Thursday then 40 mg, for 2 weeks, then back to 20 mg daily.  He continues to wear support stocking.  His MV is stable on echo.  He will have BMP next week.  His last Cr was 1.97.  He will call for increased edema -if edema completely resolves then he will change lasix to prn.  Follow up with Dr. Tamala Julian in 3-4 months.    2.   Chronic a flutter stable rate cotnrolled  3.   Mechanical MV with surgery X 3.  Stable on recent echo.  On coumadin.    Current medicines are reviewed with the patient today.  The patient Has no concerns regarding medicines.  The following changes have been made:  See above Labs/ tests ordered today include:see above  Disposition:   FU:  see above  Signed, Cecilie Kicks, NP  07/15/2017 2:13 PM    [facility] [place], [place], [place] Phone: [phone]; Fax: [phone]

## 2017-07-15 NOTE — Patient Instructions (Signed)
Medication Instructions:   FOR 2 WEEKS TAKE LASIX 20 MG ONCE A DAY EXCEPT ON MON AND THURS TAKE 40 MG   THEN RESUME TAKING LASIX 20 MG ONCE A DAY    If you need a refill on your cardiac medications before your next appointment, please call your pharmacy.  Labwork:  BMET TO BE DRAWN AT PCP     Testing/Procedures: NONE ORDERED  TODAY    Follow-Up:  IN 3 TO 4 MONTHS WITH DR. Tamala Julian    Any Other Special Instructions Will Be Listed Below (If Applicable).

## 2017-07-18 ENCOUNTER — Ambulatory Visit (INDEPENDENT_AMBULATORY_CARE_PROVIDER_SITE_OTHER): Payer: 59 | Admitting: Otolaryngology

## 2017-07-25 ENCOUNTER — Ambulatory Visit (INDEPENDENT_AMBULATORY_CARE_PROVIDER_SITE_OTHER): Payer: 59 | Admitting: Otolaryngology

## 2017-09-19 ENCOUNTER — Other Ambulatory Visit (HOSPITAL_BASED_OUTPATIENT_CLINIC_OR_DEPARTMENT_OTHER): Payer: Self-pay | Admitting: Family Medicine

## 2017-09-19 ENCOUNTER — Ambulatory Visit (HOSPITAL_BASED_OUTPATIENT_CLINIC_OR_DEPARTMENT_OTHER): Payer: 59

## 2017-09-19 DIAGNOSIS — M79662 Pain in left lower leg: Secondary | ICD-10-CM

## 2017-09-20 ENCOUNTER — Ambulatory Visit (HOSPITAL_BASED_OUTPATIENT_CLINIC_OR_DEPARTMENT_OTHER)
Admission: RE | Admit: 2017-09-20 | Discharge: 2017-09-20 | Disposition: A | Discharge status: Home / Self Care | Payer: 59 | Source: Ambulatory Visit | Attending: Family Medicine | Admitting: Family Medicine

## 2017-09-20 DIAGNOSIS — M79662 Pain in left lower leg: Secondary | ICD-10-CM | POA: Diagnosis not present

## 2017-11-01 NOTE — Progress Notes (Signed)
Cardiology Office Note:    Date:  11/03/2017   ID:  [name], DOB [birth year], MRN [id number]  PCP:  Hulan Fess, MD  Cardiologist:  No primary care provider on file.   Referring MD: Hulan Fess, MD   Chief Complaint  Patient presents with  . Cardiac Valve Problem  . Shortness of Breath    History of Present Illness:    [name] is a 67 y.o. 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 Other problems include hepatitis C, chronic atrial flutter, ascending aortic aneurysm 4.5 cm, chronic kidney disease stage III, chronic anemia and chronic anticoagulation.Marland Kitchen  He is doing better.  Earlier this year he had difficulty with diet and lost weight.  Appetite is now better.  He is on his chronic therapy for chronic kidney disease and hepatitis C.  He has not noticed significant blood in his urine.  No recent evidence of hemolysis.  He denies orthopnea, PND, and has had better control of lower extremity edema recently.  He was seen by Cecilie Kicks in March/April 2019 with increased lower extremity swelling and shortness of breath.  Diuretic regimen was transiently increased and there has been subsequent improvement.  He was recently seen by nephrology, Aug 12, 2017.  Kidney function was felt to be stable at that time and there was no clinical evidence of significant volume overload other than chronic lower extremity edema.  Past Medical History:  Diagnosis Date  . Allergy   . Aneurysm of ascending aorta (HCC)   . Atrial fibrillation ([place])   . BPH (benign prostatic hyperplasia)   . Colon polyps   . FHx: rheumatic heart disease   . H/O diplopia   . Hepatitis C   . Hypertension   . Hypogonadism male   . Mitral valve disease     Past Surgical History:  Procedure Laterality Date  . COLON SURGERY    .  COLONOSCOPY    . COLONOSCOPY N/A 11/14/2012   Procedure: COLONOSCOPY;  Surgeon: Lear Ng, MD;  Location: [facility];  Service: Endoscopy;  Laterality: N/A;  . ESOPHAGOGASTRODUODENOSCOPY (EGD) WITH PROPOFOL N/A 05/08/2014   Procedure: ESOPHAGOGASTRODUODENOSCOPY (EGD) WITH PROPOFOL;  Surgeon: Lear Ng, MD;  Location: [place];  Service: Endoscopy;  Laterality: N/A;  . MITRAL VALVE REPLACEMENT      Current Medications: Current Meds  Medication Sig  . amoxicillin (AMOXIL) 500 MG capsule TAKE FOUR CAPSULES BY MOUTH 1 HOUR BEFORE DENTAL APPOINTMENT  . furosemide (LASIX) 20 MG tablet Take 20 mg by mouth daily.   . IRON PO Take 1 tablet by mouth every other day.  . Multiple Vitamin (MULTIVITAMIN WITH MINERALS) TABS tablet Take 1 tablet by mouth daily.  . tadalafil (CIALIS) 5 MG tablet Take 5 mg by mouth daily as needed for erectile dysfunction.  Marland Kitchen testosterone cypionate (DEPOTESTOSTERONE CYPIONATE) 200 MG/ML injection Inject 200 mg into the muscle every Saturday.  . warfarin (COUMADIN) 5 MG tablet Take 5 mg by mouth See admin instructions. Take 5 mg every day except Friday take 7.5 mg     Allergies:   Penicillins   Social History   Socioeconomic History  . Marital status: Legally Separated    Spouse name: Not on file  . Number of children: Not on file  . Years of education: Not on file  . Highest education  level: Not on file  Occupational History  . Not on file  Social Needs  . Financial resource strain: Not on file  . Food insecurity:    Worry: Not on file    Inability: Not on file  . Transportation needs:    Medical: Not on file    Non-medical: Not on file  Tobacco Use  . Smoking status: Former Smoker    Types: Cigars    Last attempt to quit: 09/03/2017    Years since quitting: 0.1  . Smokeless tobacco: Never Used  Substance and Sexual Activity  . Alcohol use: No  . Drug use: No  . Sexual activity: Not on file  Lifestyle  . Physical activity:     Days per week: Not on file    Minutes per session: Not on file  . Stress: Not on file  Relationships  . Social connections:    Talks on phone: Not on file    Gets together: Not on file    Attends religious service: Not on file    Active member of club or organization: Not on file    Attends meetings of clubs or organizations: Not on file    Relationship status: Not on file  Other Topics Concern  . Not on file  Social History Narrative  . Not on file     Family History: The patient's Family history is unknown by patient.  ROS:   Please see the history of present illness.    Still has difficulty with lower extremity swelling.  Appetite is poor.  He denies dysphagia.  All other systems reviewed and are negative.  EKGs/Labs/Other Studies Reviewed:    The following studies were reviewed today: Clinical data from [place] reviewed in detail.  EKG:  EKG is  ordered today.  The tracing performed in April 2019 at primary care physician's office revealed atrial fibrillation with prominent voltage.  Recent Labs: 06/10/2017: BUN 27; Creatinine, Ser 1.97; Hemoglobin 10.2; Platelets 115; Potassium 4.0; Sodium 140  Recent Lipid Panel No results found for: CHOL, TRIG, HDL, CHOLHDL, VLDL, LDLCALC, LDLDIRECT  Physical Exam:    VS:  BP 130/64   Pulse 64   Ht 6\' 1"  (1.854 m)   Wt 147 lb (66.7 kg)   BMI 19.39 kg/m     Wt Readings from Last 3 Encounters:  11/03/17 147 lb (66.7 kg)  07/15/17 152 lb 12.8 oz (69.3 kg)  07/01/17 147 lb (66.7 kg)     GEN: Under nourished appearing and in no acute distress HEENT: Normal NECK: No JVD. LYMPHATICS: No lymphadenopathy CARDIAC: IIRR, 1/6 apical systolic murmur, no gallop, 1-2+ bilateral ankle edema. VASCULAR: Radial and posterior tibial 2+ pulses.  No bruits. RESPIRATORY:  Clear to auscultation without rales, wheezing or rhonchi  ABDOMEN: Soft, non-tender, non-distended, No pulsatile mass, MUSCULOSKELETAL: No deformity    SKIN: Warm and dry NEUROLOGIC:  Alert and oriented x 3 PSYCHIATRIC:  Normal affect   ASSESSMENT:    1. Mechanical mitral valve with surgery x3   2. Thoracic aortic aneurysm without rupture ([place])   3. Chronic diastolic heart failure ([place])   4. Chronic kidney disease, stage III (moderate) (HCC)   5. Chronic atrial flutter (HCC)   6. Encounter for therapeutic drug monitoring    PLAN:    In order of problems listed above:  1. Clinically normal function of the mechanical mitral valve.  No significant murmur is heard. 2. Stable aortic root 3. Mild lower extremity edema.  No  evidence of pulmonary congestion.  A CV wave is noted in the jugular vein. 4. Reviewed the note from Dr. Vanetta Mulders at [facility].  Kidney function does not allow ACE/ARB therapy. 5. Unchanged.  Continue current diuretic regimen.  No change in therapy.  Basic metabolic panel was obtained today.  Information will be forwarded to nephrology.  Call if increasing shortness of breath or swelling occurs.   Medication Adjustments/Labs and Tests Ordered: Current medicines are reviewed at length with the patient today.  Concerns regarding medicines are outlined above.  Orders Placed This Encounter  Procedures  . Basic metabolic panel   No orders of the defined types were placed in this encounter.   Patient Instructions  Medication Instructions:  Your physician recommends that you continue on your current medications as directed. Please refer to the Current Medication list given to you today.  Labwork: BMET today  Testing/Procedures: None  Follow-Up: Your physician wants you to follow-up in: 6-9 months with Dr. Tamala Julian.  You will receive a reminder letter in the mail two months in advance. If you don't receive a letter, please call our office to schedule the follow-up appointment.   Any Other Special Instructions Will Be Listed Below (If Applicable).     If you need a refill on your  cardiac medications before your next appointment, please call your pharmacy.      Signed, Sinclair Grooms, MD  11/03/2017 1:23 PM    [facility]

## 2017-11-03 ENCOUNTER — Encounter: Payer: Self-pay | Admitting: Interventional Cardiology

## 2017-11-03 ENCOUNTER — Ambulatory Visit (INDEPENDENT_AMBULATORY_CARE_PROVIDER_SITE_OTHER): Payer: 59 | Admitting: Interventional Cardiology

## 2017-11-03 VITALS — BP 130/64 | HR 64 | Ht 73.0 in | Wt 147.0 lb

## 2017-11-03 DIAGNOSIS — N183 Chronic kidney disease, stage 3 unspecified: Secondary | ICD-10-CM

## 2017-11-03 DIAGNOSIS — I5032 Chronic diastolic (congestive) heart failure: Secondary | ICD-10-CM

## 2017-11-03 DIAGNOSIS — I712 Thoracic aortic aneurysm, without rupture, unspecified: Secondary | ICD-10-CM

## 2017-11-03 DIAGNOSIS — I059 Rheumatic mitral valve disease, unspecified: Secondary | ICD-10-CM | POA: Diagnosis not present

## 2017-11-03 DIAGNOSIS — Z5181 Encounter for therapeutic drug level monitoring: Secondary | ICD-10-CM

## 2017-11-03 DIAGNOSIS — I4892 Unspecified atrial flutter: Secondary | ICD-10-CM

## 2017-11-03 LAB — BASIC METABOLIC PANEL
BUN / CREAT RATIO: 13 (ref 10–24)
BUN: 24 mg/dL (ref 8–27)
CO2: 21 mmol/L (ref 20–29)
CREATININE: 1.88 mg/dL — AB (ref 0.76–1.27)
Calcium: 9.2 mg/dL (ref 8.6–10.2)
Chloride: 101 mmol/L (ref 96–106)
GFR calc Af Amer: 42 mL/min/{1.73_m2} — ABNORMAL LOW (ref >59)
GFR, EST NON AFRICAN AMERICAN: 37 mL/min/{1.73_m2} — AB (ref >59)
Glucose: 83 mg/dL (ref 65–99)
POTASSIUM: 3.9 mmol/L (ref 3.5–5.2)
SODIUM: 137 mmol/L (ref 134–144)

## 2017-11-03 NOTE — Patient Instructions (Signed)
Medication Instructions:  Your physician recommends that you continue on your current medications as directed. Please refer to the Current Medication list given to you today.  Labwork: BMET today  Testing/Procedures: None  Follow-Up: Your physician wants you to follow-up in: 6-9 months with Dr. Tamala Julian.  You will receive a reminder letter in the mail two months in advance. If you don't receive a letter, please call our office to schedule the follow-up appointment.   Any Other Special Instructions Will Be Listed Below (If Applicable).     If you need a refill on your cardiac medications before your next appointment, please call your pharmacy.

## 2018-03-04 ENCOUNTER — Emergency Department (HOSPITAL_COMMUNITY)
Admission: EM | Admit: 2018-03-04 | Discharge: 2018-03-04 | DRG: 291 | Disposition: A | Discharge status: Home / Self Care | Payer: 59 | Attending: Emergency Medicine | Admitting: Emergency Medicine

## 2018-03-04 ENCOUNTER — Emergency Department (HOSPITAL_COMMUNITY): Payer: 59

## 2018-03-04 ENCOUNTER — Encounter (HOSPITAL_COMMUNITY): Payer: Self-pay | Admitting: Emergency Medicine

## 2018-03-04 DIAGNOSIS — Z7901 Long term (current) use of anticoagulants: Secondary | ICD-10-CM | POA: Diagnosis not present

## 2018-03-04 DIAGNOSIS — Z79899 Other long term (current) drug therapy: Secondary | ICD-10-CM

## 2018-03-04 DIAGNOSIS — N189 Chronic kidney disease, unspecified: Secondary | ICD-10-CM | POA: Diagnosis not present

## 2018-03-04 DIAGNOSIS — F17290 Nicotine dependence, other tobacco product, uncomplicated: Secondary | ICD-10-CM | POA: Diagnosis not present

## 2018-03-04 DIAGNOSIS — I5033 Acute on chronic diastolic (congestive) heart failure: Secondary | ICD-10-CM | POA: Diagnosis not present

## 2018-03-04 DIAGNOSIS — I130 Hypertensive heart and chronic kidney disease with heart failure and stage 1 through stage 4 chronic kidney disease, or unspecified chronic kidney disease: Secondary | ICD-10-CM | POA: Diagnosis present

## 2018-03-04 DIAGNOSIS — I4891 Unspecified atrial fibrillation: Secondary | ICD-10-CM | POA: Diagnosis not present

## 2018-03-04 DIAGNOSIS — I712 Thoracic aortic aneurysm, without rupture: Secondary | ICD-10-CM | POA: Diagnosis not present

## 2018-03-04 DIAGNOSIS — I4892 Unspecified atrial flutter: Secondary | ICD-10-CM | POA: Diagnosis present

## 2018-03-04 DIAGNOSIS — Z880 Allergy status to penicillin: Secondary | ICD-10-CM

## 2018-03-04 DIAGNOSIS — N183 Chronic kidney disease, stage 3 (moderate): Secondary | ICD-10-CM | POA: Diagnosis present

## 2018-03-04 DIAGNOSIS — B1920 Unspecified viral hepatitis C without hepatic coma: Secondary | ICD-10-CM | POA: Diagnosis present

## 2018-03-04 DIAGNOSIS — Z952 Presence of prosthetic heart valve: Secondary | ICD-10-CM | POA: Diagnosis not present

## 2018-03-04 DIAGNOSIS — I509 Heart failure, unspecified: Secondary | ICD-10-CM

## 2018-03-04 LAB — CBC WITH DIFFERENTIAL/PLATELET
ABS IMMATURE GRANULOCYTES: 0.01 10*3/uL (ref 0.00–0.07)
BASOS ABS: 0 10*3/uL (ref 0.0–0.1)
Basophils Relative: 1 %
EOS PCT: 5 %
Eosinophils Absolute: 0.3 10*3/uL (ref 0.0–0.5)
HCT: 35.7 % — ABNORMAL LOW (ref 39.0–52.0)
Hemoglobin: 10.5 g/dL — ABNORMAL LOW (ref 13.0–17.0)
Immature Granulocytes: 0 %
LYMPHS ABS: 0.7 10*3/uL (ref 0.7–4.0)
Lymphocytes Relative: 12 %
MCH: 32.7 pg (ref 26.0–34.0)
MCHC: 29.4 g/dL — ABNORMAL LOW (ref 30.0–36.0)
MCV: 111.2 fL — ABNORMAL HIGH (ref 80.0–100.0)
Monocytes Absolute: 0.6 10*3/uL (ref 0.1–1.0)
Monocytes Relative: 11 %
NEUTROS ABS: 4.2 10*3/uL (ref 1.7–7.7)
NRBC: 0 % (ref 0.0–0.2)
Neutrophils Relative %: 71 %
Platelets: 92 10*3/uL — ABNORMAL LOW (ref 150–400)
RBC: 3.21 MIL/uL — ABNORMAL LOW (ref 4.22–5.81)
RDW: 24.8 % — AB (ref 11.5–15.5)
WBC: 5.9 10*3/uL (ref 4.0–10.5)

## 2018-03-04 LAB — BRAIN NATRIURETIC PEPTIDE: B Natriuretic Peptide: 523.2 pg/mL — ABNORMAL HIGH (ref 0.0–100.0)

## 2018-03-04 LAB — COMPREHENSIVE METABOLIC PANEL
ALT: 48 U/L — AB (ref 0–44)
AST: 136 U/L — ABNORMAL HIGH (ref 15–41)
Albumin: 3.6 g/dL (ref 3.5–5.0)
Alkaline Phosphatase: 47 U/L (ref 38–126)
Anion gap: 8 (ref 5–15)
BUN: 20 mg/dL (ref 8–23)
CHLORIDE: 110 mmol/L (ref 98–111)
CO2: 21 mmol/L — ABNORMAL LOW (ref 22–32)
CREATININE: 2.16 mg/dL — AB (ref 0.61–1.24)
Calcium: 9 mg/dL (ref 8.9–10.3)
GFR, EST AFRICAN AMERICAN: 36 mL/min — AB (ref >60)
GFR, EST NON AFRICAN AMERICAN: 31 mL/min — AB (ref >60)
Glucose, Bld: 110 mg/dL — ABNORMAL HIGH (ref 70–99)
Potassium: 3.6 mmol/L (ref 3.5–5.1)
Sodium: 139 mmol/L (ref 135–145)
Total Bilirubin: 3.1 mg/dL — ABNORMAL HIGH (ref 0.3–1.2)
Total Protein: 8 g/dL (ref 6.5–8.1)

## 2018-03-04 LAB — I-STAT TROPONIN, ED: Troponin i, poc: 0.05 ng/mL (ref 0.00–0.08)

## 2018-03-04 LAB — PROTIME-INR
INR: 3.78
Prothrombin Time: 36.7 seconds — ABNORMAL HIGH (ref 11.4–15.2)

## 2018-03-04 MED ORDER — ASPIRIN EC 81 MG PO TBEC: 81.0000 mg | DELAYED_RELEASE_TABLET | Freq: Every day | ORAL | Status: DC

## 2018-03-04 MED ORDER — SODIUM CHLORIDE 0.9% FLUSH: 3.0000 mL | Freq: Two times a day (BID) | INTRAVENOUS | Status: DC

## 2018-03-04 MED ORDER — SODIUM CHLORIDE 0.9 % IV SOLN: 250.0000 mL | INTRAVENOUS | Status: DC | PRN

## 2018-03-04 MED ORDER — FUROSEMIDE 10 MG/ML IJ SOLN
40.0000 mg | Freq: Once | INTRAMUSCULAR | Status: AC
  Administered 2018-03-04: 40 mg via INTRAVENOUS
  Filled 2018-03-04: qty 4

## 2018-03-04 MED ORDER — ONDANSETRON HCL 4 MG/2ML IJ SOLN: 4.0000 mg | Freq: Four times a day (QID) | INTRAMUSCULAR | Status: DC | PRN

## 2018-03-04 MED ORDER — MORPHINE SULFATE (PF) 2 MG/ML IV SOLN: 2.0000 mg | INTRAVENOUS | Status: DC | PRN

## 2018-03-04 MED ORDER — SODIUM CHLORIDE 0.9% FLUSH: 3.0000 mL | INTRAVENOUS | Status: DC | PRN

## 2018-03-04 MED ORDER — ACETAMINOPHEN 325 MG PO TABS: 650.0000 mg | ORAL_TABLET | ORAL | Status: DC | PRN

## 2018-03-04 MED ORDER — FUROSEMIDE 10 MG/ML IJ SOLN: 40.0000 mg | Freq: Two times a day (BID) | INTRAMUSCULAR | Status: DC

## 2018-03-04 NOTE — ED Notes (Signed)
Patient diuresing well - urine color however dark brown. Patient states this is normal for him whenever he's dehydrated and he hasn't had anything to drink all day. EDP made aware. Patient adds that he is being seen for this issue and he does not want his urine sent for any tests.

## 2018-03-04 NOTE — Consult Note (Signed)
Cardiology Consultation:   Patient ID: [name] MRN: [id number]; DOB: 11/17/1950  Admit date: 03/04/2018 Date of Consult: 03/04/2018  Primary Care Provider: Hulan Fess, MD Primary Cardiologist: Janne Napoleon Primary Electrophysiologist:  None    Patient Profile:   [name] is a 67 y.o. male with a hx of mitral valve replacement, atrial flutter who is being seen today for the evaluation of diastolic heart failure at the request of Davonna Belling.  History of Present Illness:   [name] is a 67 year old male with a history of rheumatic heart disease status post mitral valve replacements x3, most recently in 2012, hepatitis C, atrial flutter, ascending aortic aneurysm, stage III CKD, anemia, and anticoagulation with warfarin.  He presented to the hospital after becoming more short of breath today.  His shortness of breath has been worsening over the last week.  He says that he has been compliant with his Lasix.  His weight has remained stable.  He is having trouble laying flat, and has been sleeping sitting up.  He otherwise has no major complaints.  He has no chest pain.  He is unaware of his atrial flutter.  Past Medical History:  Diagnosis Date  . Allergy   . Aneurysm of ascending aorta (HCC)   . Atrial fibrillation ([place])   . BPH (benign prostatic hyperplasia)   . Colon polyps   . FHx: rheumatic heart disease   . H/O diplopia   . Hepatitis C   . Hypertension   . Hypogonadism male   . Mitral valve disease     Past Surgical History:  Procedure Laterality Date  . COLON SURGERY    . COLONOSCOPY    . COLONOSCOPY N/A 11/14/2012   Procedure: COLONOSCOPY;  Surgeon: Lear Ng, MD;  Location: [facility];  Service: Endoscopy;  Laterality: N/A;  . ESOPHAGOGASTRODUODENOSCOPY (EGD) WITH PROPOFOL N/A 05/08/2014   Procedure: ESOPHAGOGASTRODUODENOSCOPY (EGD) WITH PROPOFOL;  Surgeon: Lear Ng, MD;  Location: [place];  Service: Endoscopy;   Laterality: N/A;  . MITRAL VALVE REPLACEMENT       Home Medications:  Prior to Admission medications   Medication Sig Start Date End Date Taking? Authorizing Provider  amoxicillin (AMOXIL) 500 MG capsule TAKE FOUR CAPSULES BY MOUTH 1 HOUR BEFORE DENTAL APPOINTMENT 06/30/17   Belva Crome, MD  furosemide (LASIX) 20 MG tablet Take 20 mg by mouth daily.     [provider]  IRON PO Take 1 tablet by mouth every other day. 07/30/10   [provider]  Multiple Vitamin (MULTIVITAMIN WITH MINERALS) TABS tablet Take 1 tablet by mouth daily.    [provider]  tadalafil (CIALIS) 5 MG tablet Take 5 mg by mouth daily as needed for erectile dysfunction.    [provider]  testosterone cypionate (DEPOTESTOSTERONE CYPIONATE) 200 MG/ML injection Inject 200 mg into the muscle every Saturday. 06/08/17   [provider]  warfarin (COUMADIN) 5 MG tablet Take 5 mg by mouth See admin instructions. Take 5 mg every day except Friday take 7.5 mg 07/09/10   [provider]    Inpatient Medications: Scheduled Meds:  Continuous Infusions:  PRN Meds:   Allergies:    Allergies  Allergen Reactions  . Penicillins Rash    Has patient had a PCN reaction causing immediate rash, facial/tongue/throat swelling, SOB or lightheadedness with hypotension: Yes Has patient had a PCN reaction causing severe rash involving mucus membranes or skin necrosis: No Has patient had a PCN reaction that required  hospitalization: No Has patient had a PCN reaction occurring within the last 10 years: No If all of the above answers are "NO", then may proceed with Cephalosporin use.     Social History:   Social History   Socioeconomic History  . Marital status: Legally Separated    Spouse name: Not on file  . Number of children: Not on file  . Years of education: Not on file  . Highest education level: Not on file  Occupational History  . Not on file  Social Needs  . Financial  resource strain: Not on file  . Food insecurity:    Worry: Not on file    Inability: Not on file  . Transportation needs:    Medical: Not on file    Non-medical: Not on file  Tobacco Use  . Smoking status: Former Smoker    Types: Cigars    Last attempt to quit: 09/03/2017    Years since quitting: 0.4  . Smokeless tobacco: Never Used  Substance and Sexual Activity  . Alcohol use: No  . Drug use: No  . Sexual activity: Not on file  Lifestyle  . Physical activity:    Days per week: Not on file    Minutes per session: Not on file  . Stress: Not on file  Relationships  . Social connections:    Talks on phone: Not on file    Gets together: Not on file    Attends religious service: Not on file    Active member of club or organization: Not on file    Attends meetings of clubs or organizations: Not on file    Relationship status: Not on file  . Intimate partner violence:    Fear of current or ex partner: Not on file    Emotionally abused: Not on file    Physically abused: Not on file    Forced sexual activity: Not on file  Other Topics Concern  . Not on file  Social History Narrative  . Not on file    Family History:    Family History  Family history unknown: Yes     ROS:  Please see the history of present illness.   All other ROS reviewed and negative.     Physical Exam/Data:   Vitals:   03/04/18 1345 03/04/18 1400 03/04/18 1415 03/04/18 1430  BP: 116/77 124/74 125/73 132/67  Pulse: 68 76 61 (!) 51  Resp: 20 20 (!) 23   Temp:      TempSrc:      SpO2: 97% 100% 99% 98%   No intake or output data in the 24 hours ending 03/04/18 1625 There were no vitals filed for this visit. There is no height or weight on file to calculate BMI.  General:  Well nourished, well developed, in no acute distress HEENT: normal Lymph: no adenopathy Neck: no JVD Endocrine:  No thryomegaly Vascular: No carotid bruits; FA pulses 2+ bilaterally without bruits  Cardiac: Irregular  rhythm, prosthetic valvular click, no murmur  Lungs:  clear to auscultation bilaterally, no wheezing, rhonchi or rales  Abd: soft, nontender, no hepatomegaly  Ext: no edema Musculoskeletal:  No deformities, BUE and BLE strength normal and equal Skin: warm and dry  Neuro:  CNs 2-12 intact, no focal abnormalities noted Psych:  Normal affect   EKG:  The EKG was personally reviewed and demonstrates: Atrial flutter, left posterior fascicular block Telemetry:  Telemetry was personally reviewed and demonstrates: Atrial flutter, PVCs  Relevant CV Studies:  TTE 07/2017 - Left ventricle: The cavity size was normal. There was mild   concentric hypertrophy. Systolic function was normal. The   estimated ejection fraction was in the range of 50% to 55%. Wall   motion was normal; there were no regional wall motion   abnormalities. The study is not technically sufficient to allow   evaluation of LV diastolic function. - Aortic valve: Transvalvular velocity was within the normal range.   There was no stenosis. There was mild regurgitation.   Regurgitation pressure half-time: 674 ms. - Mitral valve: A mechanical prosthesis was present. Transvalvular   velocity was within the normal range. There was no evidence for   stenosis. There was no regurgitation. Pressure half-time: 78 ms.   Valve area by continuity equation (using LVOT flow): 1.71 cm^2. - Right ventricle: The cavity size was mildly dilated. Wall   thickness was normal. Systolic function was moderately reduced. - Atrial septum: No defect or patent foramen ovale was identified. - Tricuspid valve: There was mild regurgitation. - Pulmonary arteries: Systolic pressure was within the normal   range. PA peak pressure: 38 mm Hg (S).  Laboratory Data:  Chemistry Recent Labs  Lab 03/04/18 1300  NA 139  K 3.6  CL 110  CO2 21*  GLUCOSE 110*  BUN 20  CREATININE 2.16*  CALCIUM 9.0  GFRNONAA 31*  GFRAA 36*  ANIONGAP 8    Recent Labs  Lab  03/04/18 1300  PROT 8.0  ALBUMIN 3.6  AST 136*  ALT 48*  ALKPHOS 47  BILITOT 3.1*   Hematology Recent Labs  Lab 03/04/18 1300  WBC 5.9  RBC 3.21*  HGB 10.5*  HCT 35.7*  MCV 111.2*  MCH 32.7  MCHC 29.4*  RDW 24.8*  PLT 92*   Cardiac EnzymesNo results for input(s): TROPONINI in the last 168 hours.  Recent Labs  Lab 03/04/18 1307  TROPIPOC 0.05    BNP Recent Labs  Lab 03/04/18 1300  BNP 523.2*    DDimer No results for input(s): DDIMER in the last 168 hours.  Radiology/Studies:  Dg Chest 2 View  Result Date: 03/04/2018 CLINICAL DATA:  Short of breath EXAM: CHEST - 2 VIEW COMPARISON:  03/04/2018 FINDINGS: The heart is moderately enlarged. Postoperative changes from mitral valve replacement. Diffuse vascular congestion. Bilateral pleural effusions. Mild basilar edema. No pneumothorax. IMPRESSION: Mild CHF with small pleural effusions and basilar edema. Electronically Signed   By: Marybelle Killings M.D.   On: 03/04/2018 13:26    Assessment and Plan:   1. Likely acute on chronic diastolic heart failure: Unfortunately his most recent echo was unable to quantify diastolic function.  This is likely due to his mechanical mitral valve.  He does have an elevated BNP, but this is made difficult to interpret due to his renal disease.  He does have pulmonary edema on his chest x-ray.  He is on room air currently.  I agree with giving him a dose of IV Lasix to see if this Laporcha Marchesi help with diuresis.  Should he diurese and to be able to ambulate without issue, would plan for discharge home with close follow-up in cardiology clinic.  I have told him to take 2 doses of his p.o. Lasix at home tomorrow.  Should he not ambulate appropriately, would plan for admission with more aggressive IV diuresis. 2. Mechanical mitral valve: Has had 3 mitral valve replacements.  Functioning appropriately on most recent echo.  To new Coumadin with an INR goal of 2.5-3.5. 3. Chronic kidney  disease: Patient's  creatinine is mildly elevated today.  This is likely due to volume overload and may improve with further diuresis.  Should he be discharged, he Reubin Bushnell need an outpatient BMP. 4. Atrial flutter: Appears to be atypical on his ECG.  He has been in atrial flutter for quite some time.  He is anticoagulated on Coumadin and asymptomatic.  No changes.      For questions or updates, please contact [place] Please consult [email or web address] for contact info under     Signed, Kandee Escalante Meredith Leeds, MD  03/04/2018 4:25 PM

## 2018-03-04 NOTE — Discharge Instructions (Signed)
It was my pleasure taking care of you today!   As we discussed, take two of your home lasix doses tomorrow.   Call Dr. Thompson Caul office first thing Monday to schedule a follow up appointment. You need an appointment early next week!   It is very important that you return to the ER for any new or worsening symptoms, any additional concerns.

## 2018-03-04 NOTE — Progress Notes (Signed)
  Progress Note   CLOYS VERA PQA:[id number] DOB: 1950/12/22 DOA: 03/04/2018  PCP: Hulan Fess, MD Consultants:  Tamala Julian - cardiology  Chief Complaint: SOB  HPI: [name] is a 67 y.o. male with medical history significant of mitral valve replacement; chronic diastolic CHF; thoracic aortic aneurysm; stage 3 CKD; HTN; and afib presenting with SOB.     ED Course:  Planned for admit for diuresis for new CHF.  Patient is not sure he is willing to stay.  Cardiology consulted and they recommend a dose of Lasix here in the ER and then d/c to home if able to successfully ambulate without dyspnea subsequently.   For now, this patient will be deferred.  He should be re-paged if the plan changes and the patient needs admission.  Orders were placed but should be disregarded at this time.    Karmen Bongo MD [facility]  If note is complete, please contact covering daytime or nighttime physician. [email or web address] Password [email or web address]  03/04/2018, 4:13 PM

## 2018-03-04 NOTE — ED Notes (Signed)
Pt and I did three laps around [facility] zone pt stated he felt a little better. Pt stated that he did not get as short winded as he did before. Pt o2 was %98 to 100 for that first to laps and after third lap pt o2 started to to drop to 90s. Pt hr rate was 80s to 115 when ambulated and how by that time we got back to room from thrid lap pt hr rate was 121.

## 2018-03-04 NOTE — ED Triage Notes (Signed)
Patient to ED from [facility] for further evaluation of intermittent shortness of breath x 3 days. Patient states it's worse with exertion and lying down. Respirations e/u, skin warm/dry. Patient denies pain, fevers/chills, or cough.

## 2018-03-04 NOTE — ED Notes (Signed)
Patient transported to x-ray. ?

## 2018-03-04 NOTE — ED Notes (Signed)
Cardiology at bedside.

## 2018-03-04 NOTE — ED Provider Notes (Signed)
[facility] EMERGENCY DEPARTMENT Provider Note   CSN: [id number] Arrival date & time: 03/04/18  1226     History   Chief Complaint Chief Complaint  Patient presents with  . Shortness of Breath    HPI [name] is a 67 y.o. male.  HPI   [name] is a 67 y.o. male, with a history of A. fib, mitral valve replacement, and HTN, presenting to the ED with shortness of breath for the last 3 days.  Also endorses lower extremity edema and orthopnea.  He has not had this issue before.  Was seen by his PCP and then sent to the ED. Denies chest pain, fever/chills, abdominal pain, cough, N/V/D, or any other complaints.    Past Medical History:  Diagnosis Date  . Allergy   . Aneurysm of ascending aorta (HCC)   . Atrial fibrillation ([place])   . BPH (benign prostatic hyperplasia)   . Colon polyps   . FHx: rheumatic heart disease   . H/O diplopia   . Hepatitis [name]   . Hypertension   . Hypogonadism male   . Mitral valve disease     Patient Active Problem List   Diagnosis Date Noted  . Acute on chronic diastolic heart failure ([place]) 03/04/2018  . Cirrhosis ([place]) 05/08/2014  . Chronic atrial flutter ([place]) 06/05/2013  . Hepatitis [name] 06/05/2013  . Chronic diastolic heart failure ([place]) 06/05/2013  . Encounter for therapeutic drug monitoring 04/30/2013  . 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 History of colonic polyps 11/14/2012  . Thoracic aortic aneurysm ([place]) 05/02/2012    Past Surgical History:  Procedure Laterality Date  . COLON SURGERY    . COLONOSCOPY    . COLONOSCOPY N/A 11/14/2012   Procedure: COLONOSCOPY;  Surgeon: Lear Ng, MD;  Location: [facility];  Service: Endoscopy;  Laterality: N/A;  . ESOPHAGOGASTRODUODENOSCOPY (EGD) WITH PROPOFOL N/A 05/08/2014   Procedure: ESOPHAGOGASTRODUODENOSCOPY (EGD) WITH PROPOFOL;  Surgeon: Lear Ng, MD;  Location: [facility];  Service: Endoscopy;  Laterality: N/A;  .  MITRAL VALVE REPLACEMENT          Home Medications    Prior to Admission medications   Medication Sig Start Date End Date Taking? Authorizing Provider  amoxicillin (AMOXIL) 500 MG capsule TAKE FOUR CAPSULES BY MOUTH 1 HOUR BEFORE DENTAL APPOINTMENT Patient taking differently: Take 2,000 mg by mouth See admin instructions. 1 hour before dental appointment 06/30/17  Yes Belva Crome, MD  furosemide (LASIX) 20 MG tablet Take 20 mg by mouth daily.    Yes [provider]  IRON PO Take 1 tablet by mouth every other day. 07/30/10  Yes [provider]  Multiple Vitamin (MULTIVITAMIN WITH MINERALS) TABS tablet Take 1 tablet by mouth daily.   Yes [provider]  testosterone cypionate (DEPOTESTOSTERONE CYPIONATE) 200 MG/ML injection Inject 200 mg into the muscle every Thursday.  06/08/17  Yes [provider]  warfarin (COUMADIN) 5 MG tablet Take 5 mg by mouth See admin instructions. Take 5 mg every day except Friday take 7.5 mg 07/09/10  Yes [provider]    Family History Family History  Family history unknown: Yes    Social History Social History   Tobacco Use  . Smoking status: Former Smoker    Types: Cigars    Last attempt to quit: 09/03/2017    Years since quitting: 0.5  . Smokeless tobacco: Never Used  Substance Use Topics  . Alcohol use: No  .  Drug use: No     Allergies   Penicillins   Review of Systems Review of Systems  Constitutional: Negative for chills and fever.  Respiratory: Positive for shortness of breath. Negative for cough.        Orthopnea  Cardiovascular: Positive for leg swelling. Negative for chest pain.  Gastrointestinal: Negative for nausea and vomiting.  Neurological: Negative for dizziness, syncope and light-headedness.  All other systems reviewed and are negative.    Physical Exam Updated Vital Signs BP 133/88   Pulse 63   Temp 97.6 F (36.4 [name]) (Oral)   Resp 19   SpO2 94%   Physical Exam    Constitutional: He appears well-developed and well-nourished. No distress.  HENT:  Head: Normocephalic and atraumatic.  Eyes: Conjunctivae are normal.  Neck: Neck supple.  Cardiovascular: Normal rate, regular rhythm, normal heart sounds and intact distal pulses.  Pulmonary/Chest: He has rales in the right lower field and the left lower field.  Some increased work of breathing with exertion.  Abdominal: Soft. There is no tenderness. There is no guarding.  Musculoskeletal: He exhibits edema.  Bilateral lower extremity edema.  Lymphadenopathy:    He has no cervical adenopathy.  Neurological: He is alert.  Skin: Skin is warm and dry. He is not diaphoretic.  Psychiatric: He has a normal mood and affect. His behavior is normal.  Nursing note and vitals reviewed.    ED Treatments / Results  Labs (all labs ordered are listed, but only abnormal results are displayed) Labs Reviewed  COMPREHENSIVE METABOLIC PANEL - Abnormal; Notable for the following components:      Result Value   CO2 21 (*)    Glucose, Bld 110 (*)    Creatinine, Ser 2.16 (*)    AST 136 (*)    ALT 48 (*)    Total Bilirubin 3.1 (*)    GFR calc non Af Amer 31 (*)    GFR calc Af Amer 36 (*)    All other components within normal limits  CBC WITH DIFFERENTIAL/PLATELET - Abnormal; Notable for the following components:   RBC 3.21 (*)    Hemoglobin 10.5 (*)    HCT 35.7 (*)    MCV 111.2 (*)    MCHC 29.4 (*)    RDW 24.8 (*)    Platelets 92 (*)    All other components within normal limits  BRAIN NATRIURETIC PEPTIDE - Abnormal; Notable for the following components:   B Natriuretic Peptide 523.2 (*)    All other components within normal limits  PROTIME-INR - Abnormal; Notable for the following components:   Prothrombin Time 36.7 (*)    All other components within normal limits  I-STAT TROPONIN, ED    EKG EKG Interpretation  Date/Time:  Saturday March 04 2018 12:39:10 EST Ventricular Rate:  85 PR Interval:     QRS Duration: 101 QT Interval:  428 QTC Calculation: 509 R Axis:   105 Text Interpretation:  Right and left arm electrode reversal, interpretation assumes no reversal Atrial flutter with predominant 3:1 AV block Ventricular premature complex Right axis deviation RSR' in V1 or V2, probably normal variant Abnormal lateral Q waves Borderline T abnormalities, inferior leads Prolonged QT interval Confirmed by Davonna Belling [phone]) on 03/04/2018 1:57:33 PM   Radiology Dg Chest 2 View  Result Date: 03/04/2018 CLINICAL DATA:  Short of breath EXAM: CHEST - 2 VIEW COMPARISON:  03/04/2018 FINDINGS: The heart is moderately enlarged. Postoperative changes from mitral valve replacement. Diffuse vascular congestion. Bilateral pleural effusions.  Mild basilar edema. No pneumothorax. IMPRESSION: Mild CHF with small pleural effusions and basilar edema. Electronically Signed   By: Marybelle Killings M.D.   On: 03/04/2018 13:26    Procedures Procedures (including critical care time)  Medications Ordered in ED Medications  furosemide (LASIX) injection 40 mg (40 mg Intravenous Given 03/04/18 1607)     Initial Impression / Assessment and Plan / ED Course  I have reviewed the triage vital signs and the nursing notes.  Pertinent labs & imaging results that were available during my care of the patient were reviewed by me and considered in my medical decision making (see chart for details).  Clinical Course as of Mar 05 637  Sat Mar 04, 2018  1544 Spoke with Dr. Domenic Polite, cardiologist. States they will see the patient.   [SJ]    Clinical Course User Index [SJ] Jesse Flegel C, PA-[name]   Patient presents with shortness of breath, orthopnea, and increased lower extremity edema.  Rales on exam, elevated BNP, and pleural effusions on chest x-ray.  Concern for new onset CHF. At rest, patient maintained excellent oxygen saturation, however, he became short of breath, tachypneic, and tachycardic with ambulation.   Symptoms were covered quickly with rest. Patient seen by cardiologist, Dr. Curt Bears.  Recommends administering IV Lasix, waiting a few hours, then reambulated the patient. See cardiology in office early next week.  End of shift patient care handoff report given to [facility], PA-[name]. Plan: Monitor patient and then re-ambulate 2-3 hours after lasix. If improved, discharge home. Patient to see cardiology early next week.    Final Clinical Impressions(s) / ED Diagnoses   Final diagnoses:  Acute congestive heart failure, unspecified heart failure type [facility])    ED Discharge Orders    None       Layla Maw 03/05/18 3846    Davonna Belling, MD 03/05/18 1526

## 2018-03-04 NOTE — ED Notes (Signed)
Patient verbalized understanding of discharge instructions and denies any further needs or questions at this time. VS stable. Patient ambulatory with steady gait.  

## 2018-03-04 NOTE — ED Notes (Addendum)
Pt ambulated are [place] twice pt o2 was %93 to %100 pt stated after second lab that he was getting little winded. Pt hr did go up to 125 and when returning back to room from walk. Hr then came down less than a minute pt hr is 87 and pt o2 at this time is %100. Pt stated no dizz and no light headiness

## 2018-03-04 NOTE — ED Provider Notes (Signed)
Care assumed from previous provider PA Joy. Please see note for further details. Case discussed, plan agreed upon.  Really, patient is a 67 year old male who presents to the emergency department for shortness of breath.  Seen by cardiology who believes symptoms are likely secondary to acute on chronic diastolic heart failure.  Cardiology recommends giving him a dose of IV Lasix and then reevaluating.  Should he diurese to be able to ambulate without any difficulty, recommend discharged home with close follow-up in cardiology clinic. Would need to take 2 doses of his home PO Lasix tomorrow as well. If he cannot ambulate well, will admit for further IV diuresis.  Patient re-evaluated following Lasix administration. He feels much better. He ambulated around ED with no hypoxia. He never got short of breath. Tech reports he ambulated much better than the first go around. He feels much improved and would very much like to go home. We spoke about having a low threshold to return to ER. Take two of his home Lasix tomorrow. Close follow up with his cardiologist. He will call first thing Monday. All questions answered.    Ward, Ozella Almond, PA-C 03/04/18 1841    Quintella Reichert, MD 03/04/18 1911

## 2018-03-06 ENCOUNTER — Telehealth: Payer: Self-pay | Admitting: *Deleted

## 2018-03-06 NOTE — Progress Notes (Signed)
Cardiology Office Note   Date:  03/07/2018   ID:  Jesse, [name] 1950/10/10, MRN [id number]  PCP:  Hulan Fess, MD Cardiologist:  Sinclair Grooms, MD 11/03/2017 Jesse Ferries, PA-C   No chief complaint on file.   History of Present Illness: [name] is a 67 y.o. 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 Other problems include hepatitis C, chronic atrial flutter, ascending aortic aneurysm 4.5 cm, chronic kidney disease stage III, chronic anemia and chronic anticoagulation  08/01 office visit, pt w/ mild edema, f/u with Dr Moshe Cipro, no med changes 11/30 ER visit for SOB, cards consult, 1 dose IV Lasix given, bid Lasix 1 day after that, f/u BMET   [name] presents for cardiology follow up.  Since the ER visit, he has been breathing better. His dry weight is 148 lbs. When he gets to 155 lbs, he gets SOB and knows he needs to do something.  He has worked with Dr. Rex Kras on taking extra Lasix on an as needed basis, but is not systematic about it.  He may have gotten extra salt, but not deliberately. Some foods may have had more salt than he realized.   He drinks a lot of water daily. Drinks 2 liters of water daily plus juice, milk, and occasional sodas.   He denies orthopnea or PND.  He has some puffiness in his ankles but otherwise no lower extremity edema.  Dyspnea on exertion is at baseline.  No chest pain.  He is generally not aware of his atrial flutter, but will hear it occasionally when everything is quiet.  He does not get lightheaded or dizzy.  He has no history of presyncope or syncope.   Past Medical History:  Diagnosis Date  . Allergy   . Aneurysm of ascending aorta (HCC)   . Atrial fibrillation ([place])   . BPH (benign prostatic hyperplasia)   . Colon polyps     . FHx: rheumatic heart disease   . H/O diplopia   . Hepatitis C   . Hypertension   . Hypogonadism male   . Mitral valve disease     Past Surgical History:  Procedure Laterality Date  . COLON SURGERY    . COLONOSCOPY    . COLONOSCOPY N/A 11/14/2012   Procedure: COLONOSCOPY;  Surgeon: Lear Ng, MD;  Location: [facility];  Service: Endoscopy;  Laterality: N/A;  . ESOPHAGOGASTRODUODENOSCOPY (EGD) WITH PROPOFOL N/A 05/08/2014   Procedure: ESOPHAGOGASTRODUODENOSCOPY (EGD) WITH PROPOFOL;  Surgeon: Lear Ng, MD;  Location: [facility];  Service: Endoscopy;  Laterality: N/A;  . MITRAL VALVE REPLACEMENT      Current Outpatient Medications  Medication Sig Dispense Refill  . amoxicillin (AMOXIL) 500 MG capsule TAKE FOUR CAPSULES BY MOUTH 1 HOUR BEFORE DENTAL APPOINTMENT (Patient taking differently: Take 2,000 mg by mouth See admin instructions. 1 hour before dental appointment) 4 capsule 0  . furosemide (LASIX) 20 MG tablet Take 20 mg by mouth daily.     . IRON PO Take 1 tablet by mouth every other day.    . Multiple Vitamin (MULTIVITAMIN WITH MINERALS) TABS tablet Take 1 tablet by mouth daily.    Marland Kitchen testosterone cypionate (DEPOTESTOSTERONE CYPIONATE) 200 MG/ML injection Inject 200 mg into the muscle every Thursday.     . warfarin (COUMADIN) 5 MG tablet Take  5 mg by mouth See admin instructions. Take 5 mg every day except Friday take 7.5 mg     No current facility-administered medications for this visit.     Allergies:   Penicillins    Social History:  The patient  reports that he quit smoking about 6 months ago. His smoking use included cigars. He has never used smokeless tobacco. He reports that he does not drink alcohol or use drugs.   Family History:  The patient's Family history is unknown by patient.  He indicated that his mother is deceased. He indicated that his father is deceased. He indicated that his brother is alive.   ROS:  Please see the history of  present illness. All other systems are reviewed and negative.    PHYSICAL EXAM: VS:  BP 110/66   Pulse 61   Ht 6\' 1"  (1.854 m)   Wt 147 lb (66.7 kg)   SpO2 98%   BMI 19.39 kg/m  , BMI Body mass index is 19.39 kg/m. GEN: Well nourished, well developed, male in no acute distress HEENT: normal for age  Neck: Minimal JVD, no carotid bruit, no masses Cardiac: Irregular rate and rhythm; crisp mechanical valve click and soft murmur, no rubs, or gallops Respiratory:  clear to auscultation bilaterally, normal work of breathing GI: soft, nontender, nondistended, + BS MS: no deformity or atrophy; trace ankle edema; distal pulses are 2+ in all 4 extremities  Skin: warm and dry, no rash Neuro:  Strength and sensation are intact Psych: euthymic mood, full affect   EKG:  EKG is ordered today. The ekg ordered today demonstrates atrial flutter, heart rate 61, ST segments appear abnormal because of the flutter waves but are unchanged from previous  ECHO: 07/05/2017 - Left ventricle: The cavity size was normal. There was mild   concentric hypertrophy. Systolic function was normal. The   estimated ejection fraction was in the range of 50% to 55%. Wall   motion was normal; there were no regional wall motion   abnormalities. The study is not technically sufficient to allow   evaluation of LV diastolic function. - Aortic valve: Transvalvular velocity was within the normal range.   There was no stenosis. There was mild regurgitation.   Regurgitation pressure half-time: 674 ms. - Mitral valve: A mechanical prosthesis was present. Transvalvular   velocity was within the normal range. There was no evidence for   stenosis. There was no regurgitation. Pressure half-time: 78 ms.   Valve area by continuity equation (using LVOT flow): 1.71 cm^2. - Right ventricle: The cavity size was mildly dilated. Wall   thickness was normal. Systolic function was moderately reduced. - Atrial septum: No defect or patent  foramen ovale was identified. - Tricuspid valve: There was mild regurgitation. - Pulmonary arteries: Systolic pressure was within the normal   range. PA peak pressure: 38 mm Hg (S).   Recent Labs: 03/04/2018: ALT 48; B Natriuretic Peptide 523.2; BUN 20; Creatinine, Ser 2.16; Hemoglobin 10.5; Platelets 92; Potassium 3.6; Sodium 139  CBC    Component Value Date/Time   WBC 5.9 03/04/2018 1300   RBC 3.21 (L) 03/04/2018 1300   HGB 10.5 (L) 03/04/2018 1300   HCT 35.7 (L) 03/04/2018 1300   PLT 92 (L) 03/04/2018 1300   MCV 111.2 (H) 03/04/2018 1300   MCH 32.7 03/04/2018 1300   MCHC 29.4 (L) 03/04/2018 1300   RDW 24.8 (H) 03/04/2018 1300   LYMPHSABS 0.7 03/04/2018 1300   MONOABS 0.6 03/04/2018 1300  EOSABS 0.3 03/04/2018 1300   BASOSABS 0.0 03/04/2018 1300   CMP Latest Ref Rng & Units 03/04/2018 11/03/2017 06/10/2017  Glucose 70 - 99 mg/dL 110(H) 83 101(H)  BUN 8 - 23 mg/dL 20 24 27(H)  Creatinine 0.61 - 1.24 mg/dL 2.16(H) 1.88(H) 1.97(H)  Sodium 135 - 145 mmol/L 139 137 140  Potassium 3.5 - 5.1 mmol/L 3.6 3.9 4.0  Chloride 98 - 111 mmol/L 110 101 109  CO2 22 - 32 mmol/L 21(L) 21 24  Calcium 8.9 - 10.3 mg/dL 9.0 9.2 9.0  Total Protein 6.5 - 8.1 g/dL 8.0 - -  Total Bilirubin 0.3 - 1.2 mg/dL 3.1(H) - -  Alkaline Phos 38 - 126 U/L 47 - -  AST 15 - 41 U/L 136(H) - -  ALT 0 - 44 U/L 48(H) - -     Lipid Panel No results found for: CHOL, HDL, LDLCALC, LDLDIRECT, TRIG, CHOLHDL    Wt Readings from Last 3 Encounters:  03/07/18 147 lb (66.7 kg)  11/03/17 147 lb (66.7 kg)  07/15/17 152 lb 12.8 oz (69.3 kg)     Other studies Reviewed: Additional studies/ records that were reviewed today include: Office notes, hospital records and testing.  ASSESSMENT AND PLAN:  1.  Chronic diastolic CHF: His weight is at baseline.  He is medication compliant.  He tries to eat a low-sodium diet but struggles with it.   - he was given instructions on Na 500 mg q meal, all liquids 2 L/day, and initiate  additional Lasix or call Dr Rex Kras for a weight gain of 3 lbs in a day or 5 lbs in a week.  - hopefully, this will help him manage fluid without more ER visits.  2.  Weight loss, Nutritional issues: His appetite is poor, BMI down to 19. He needs to be on low-sodium diet.  He also has dietary limitations because of being on Coumadin.  In addition, his renal function is decreased. He is willing to see a nutritionist, will order.   3. HTN: BP is at goal today. No med changes.  4. Long-term persistent atrial flutter: rate is controlled, no med changes.  5. Chronic anticoagulation: no bleeding issues. INR at ER visit 3.78. F/u as scheduled.   Current medicines are reviewed at length with the patient today.  The patient has concerns regarding medicines. Concerns were addressed.  The following changes have been made:  no change  Labs/ tests ordered today include:   Orders Placed This Encounter  Procedures  . Ambulatory referral to Nutrition and Diabetic Education  . EKG 12-Lead     Disposition:   FU with Sinclair Grooms, MD  Signed, Jesse Ferries, PA-C  03/07/2018 10:12 AM    [facility] Phone: [phone]; Fax: [phone]

## 2018-03-06 NOTE — Telephone Encounter (Signed)
Belva Crome, MD  P Cv Div Ch St Triage        Needs OV on Monday or Tuesday.   Previous Messages     APPT MADE FOR TOM WITH RHONDA BARRETT PA AT 8:30 AM PT AWARE .Adonis Housekeeper

## 2018-03-07 ENCOUNTER — Ambulatory Visit (INDEPENDENT_AMBULATORY_CARE_PROVIDER_SITE_OTHER): Payer: 59 | Admitting: Physician Assistant

## 2018-03-07 ENCOUNTER — Encounter: Payer: Self-pay | Admitting: Physician Assistant

## 2018-03-07 VITALS — BP 110/66 | HR 61 | Ht 73.0 in | Wt 147.0 lb

## 2018-03-07 DIAGNOSIS — I4892 Unspecified atrial flutter: Secondary | ICD-10-CM

## 2018-03-07 DIAGNOSIS — I10 Essential (primary) hypertension: Secondary | ICD-10-CM

## 2018-03-07 DIAGNOSIS — Z7901 Long term (current) use of anticoagulants: Secondary | ICD-10-CM

## 2018-03-07 DIAGNOSIS — R634 Abnormal weight loss: Secondary | ICD-10-CM

## 2018-03-07 DIAGNOSIS — I5032 Chronic diastolic (congestive) heart failure: Secondary | ICD-10-CM | POA: Diagnosis not present

## 2018-03-07 NOTE — Patient Instructions (Signed)
Medication Instructions:  The current medical regimen is effective;  continue present plan and medications.  If you need a refill on your cardiac medications before your next appointment, please call your pharmacy.   Lab work: None Ordered. If you have labs (blood work) drawn today and your tests are completely normal, you will receive your results only by: Marland Kitchen MyChart Message (if you have MyChart) OR . A paper copy in the mail If you have any lab test that is abnormal or we need to change your treatment, we will call you to review the results.  Testing/Procedures: None Ordered.  Follow-Up: At [facility], you and your health needs are our priority.  As part of our continuing mission to provide you with exceptional heart care, we have created designated Provider Care Teams.  These Care Teams include your primary Cardiologist (physician) and Advanced Practice Providers (APPs -  Physician Assistants and Nurse Practitioners) who all work together to provide you with the care you need, when you need it. . Follow up with Dr.Smith at first available, preferred January.   Any Other Special Instructions Will Be Listed Below (If Applicable). NO MORE than 500mg  of Sodium per meal. DO NOT DRINK more than 2 quarts of Liquids per day. Weight daily, if you gain more than 3 lbs in a day or 5 lbs in a week please let us know. Follow Lasix directions on extra doses.

## 2018-03-09 ENCOUNTER — Ambulatory Visit: Payer: 59 | Admitting: Dietician

## 2018-03-14 ENCOUNTER — Encounter: Payer: 59 | Attending: Family Medicine | Admitting: Dietician

## 2018-03-14 DIAGNOSIS — R634 Abnormal weight loss: Secondary | ICD-10-CM | POA: Diagnosis present

## 2018-03-14 DIAGNOSIS — I5032 Chronic diastolic (congestive) heart failure: Secondary | ICD-10-CM | POA: Diagnosis not present

## 2018-03-14 NOTE — Progress Notes (Signed)
Medical Nutrition Therapy:  Appt start time: 1440 end time:  6701.   Assessment:  Primary concerns today: Patient is here today alone.  He was referred for chronic a-fib on coumadin, CHF, and lost of weight.  Patient's biggest concern is gaining weight without gaining fluid.  He would like to get a consistent meal plan. Other history includes HTN and stage 3 CKD (eGFR 36 (03/04/18), and anemia.  Weight hx: 149 lbs today  BMI 19,7 155 lbs (develops breathing difficulty when this is fluid weight. 155 lbs 3 months ago.  He started losing at that time due to job and personal stress, poor appetite, eating less and working at night. He has been struggling with the CHF for the past 6 months.  Patient lives alone.  He works for [employer].  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  He gets a lunch break and other breaks as needed.    Sleeps 12:30/1:00 - 8:30 pm  Preferred Learning Style:   No preference indicated   Learning Readiness:    Ready  Change in progress   MEDICATIONS: see list to include Lasix and MVI   DIETARY INTAKE:  Usual eating pattern includes 3 meals and 1 snacks per day. Avoided foods include a lot of greens.  He does have broccoli a couple times per week and salad 1-2 times per week.  Avoids salts except for a little on scrambled eggs.  Forgets to read labels at times.  24-hr recall:  B ( AM): regular oatmeal, whole milk, cinnamon and raisins or banana and bagel with margarine and honey or cream cheese OR cereal, whole milk, banana, bagel with margarine and honey or cream cheese Snk ( AM):  L ( PM): PB&J sandwich, fruit and occasional high calorie high protein shake-2 scoops powder, almond milk, banana, berries, sometimes peanut butter (960 calories, 21 g protein) Snk ( PM): protein shake if he has not had this before D ( PM): pasta with meat sauce or chili with meat OR salad with chicken Snk ( PM): brownie OR cake OR  pie Beverages: OJ or regular gingerale, coffee with cream and sugar, hot tea with sugar OR sweet tea  (no fluid restriction per patient)  Usual physical activity:   Estimated energy needs: 2400-2500 calories 90 g protein  Progress Towards Goal(s):  In progress.   Nutritional Diagnosis:  Unintentional weight loss related to metabolic issues, increased stress, poor appetite and poor intake as evidenced by patient report.    Intervention:  Nutrition counseling/education related to a low sodium diet.  Patient is getting increased sodium at times when eating out.  Discussed hidden sodium and also to read labels on sauces and seasonings.  He follows a consistent vitamin K diet due to coumadin use.  His caloric intake is adequate when he is eating well.  Discussed that he needs to increase his shake on days he is not able to eat well.  Discussed possibly breaking up his meals a little and/or adding snacks.  Plan: When eating out, go to restaurants that can prepare your food and ask for your food to be prepared without salt. Choose fresh meat, vegetables, fresh fruit, unprocessed grains.  Canned food with no added salt. Read food labels. Aim to keep daily sodium intake around 2000 mg sodium per day. Avoid salt substitutes that have Potassium chloride in them unless approved by your MD. These are "no salt", "lite salt" and  others. Continue your high calorie high protein shake daily and if you cannot eat well due to poor appetite than increase to 2. Consider having more snacks (mid afternoon and early am).  Teaching Method Utilized:  Visual Auditory  Handouts given during visit include:  Heart failure nutrition therapy from AND  Heart failure nutrition therapy for undernourished from AND  Label reading  Barriers to learning/adherence to lifestyle change: none  Demonstrated degree of understanding via:  Teach Back   Monitoring/Evaluation:  Dietary intake, exercise, label reading, and  body weight prn.

## 2018-03-14 NOTE — Patient Instructions (Addendum)
When eating out, go to restaurants that can prepare your food and ask for your food to be prepared without salt.  Choose fresh meat, vegetables, fresh fruit, unprocessed grains.  Canned food with no added salt.  Read food labels. Aim to keep daily sodium intake around 2000 mg sodium per day. Avoid salt substitutes that have Potassium chloride in them unless approved by your MD. These are "no salt", "lite salt" and others.  Continue your high calorie high protein shake daily and if you cannot eat well due to poor appetite than increase to 2.  Consider having more snacks (mid afternoon and early am).

## 2018-03-20 ENCOUNTER — Ambulatory Visit: Payer: 59 | Admitting: Cardiology

## 2018-04-12 ENCOUNTER — Other Ambulatory Visit: Payer: Self-pay | Admitting: Interventional Cardiology

## 2018-04-12 NOTE — Telephone Encounter (Signed)
Pt's pharmacy is requesting a refill on amoxicillin. Would Dr. Tamala Julian like to refill this medication? Please address.

## 2018-04-13 NOTE — Telephone Encounter (Signed)
Yes, please fill/  He requires antibiotics prior to dental work due to valve.

## 2018-05-14 NOTE — Progress Notes (Signed)
Cardiology Office Note:    Date:  05/15/2018   ID:  [name], [name] 1950-04-20, MRN [id number]  PCP:  Hulan Fess, MD  Cardiologist:  Sinclair Grooms, MD   Referring MD: Hulan Fess, MD   Chief Complaint  Patient presents with  . Cardiac Valve Problem    History of Present Illness:    [name] is a 68 y.o. 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 Other problems include hepatitis C, chronic atrial flutter, ascending aortic aneurysm 4.5 cm, chronic kidney disease stage III, chronic anemia and chronic anticoagulation.Jenny Reichmann appears chronically ill.  Swelling comes and goes.  He correlates this with dietary intake of sodium.  He occasionally has orthopnea.  He has not had syncope.  He has occasional blood in the stool.  There is been no change over several years with reference to bleeding.  He denies palpitations.  He has not had syncope.  He is compliant with his medical regimen.  Appetite fluctuates.  Past Medical History:  Diagnosis Date  . Allergy   . Aneurysm of ascending aorta (HCC)   . Atrial fibrillation ([place])   . BPH (benign prostatic hyperplasia)   . Colon polyps   . FHx: rheumatic heart disease   . H/O diplopia   . Hepatitis C   . Hypertension   . Hypogonadism male   . Mitral valve disease     Past Surgical History:  Procedure Laterality Date  . COLON SURGERY    . COLONOSCOPY    . COLONOSCOPY N/A 11/14/2012   Procedure: COLONOSCOPY;  Surgeon: Lear Ng, MD;  Location: [facility];  Service: Endoscopy;  Laterality: N/A;  . ESOPHAGOGASTRODUODENOSCOPY (EGD) WITH PROPOFOL N/A 05/08/2014   Procedure: ESOPHAGOGASTRODUODENOSCOPY (EGD) WITH PROPOFOL;  Surgeon: Lear Ng, MD;  Location: [place];  Service: Endoscopy;  Laterality: N/A;  . MITRAL VALVE  REPLACEMENT      Current Medications: Current Meds  Medication Sig  . amoxicillin (AMOXIL) 500 MG capsule TAKE FOUR CAPSULES BY MOUTH 1 HOUR BEFORE DENTAL APPOINTMENT  . furosemide (LASIX) 20 MG tablet Take 20 mg by mouth daily.   . IRON PO Take 1 tablet by mouth every other day.  . Multiple Vitamin (MULTIVITAMIN WITH MINERALS) TABS tablet Take 1 tablet by mouth daily.  . sildenafil (VIAGRA) 100 MG tablet Take 100 mg by mouth as needed.  . testosterone cypionate (DEPOTESTOSTERONE CYPIONATE) 200 MG/ML injection Inject 200 mg into the muscle every Thursday.   . warfarin (COUMADIN) 5 MG tablet Take 5 mg by mouth See admin instructions. Take 5 mg every day except Friday take 7.5 mg     Allergies:   Penicillins   Social History   Socioeconomic History  . Marital status: Legally Separated    Spouse name: Not on file  . Number of children: Not on file  . Years of education: Not on file  . Highest education level: Not on file  Occupational History  . Not on file  Social Needs  . Financial resource strain: Not on file  . Food insecurity:    Worry: Not on file    Inability: Not on file  . Transportation needs:    Medical: Not on file    Non-medical: Not on file  Tobacco Use  . Smoking status: Former Smoker    Types:  Cigars    Last attempt to quit: 09/03/2017    Years since quitting: 0.6  . Smokeless tobacco: Never Used  Substance and Sexual Activity  . Alcohol use: No  . Drug use: No  . Sexual activity: Not on file  Lifestyle  . Physical activity:    Days per week: Not on file    Minutes per session: Not on file  . Stress: Not on file  Relationships  . Social connections:    Talks on phone: Not on file    Gets together: Not on file    Attends religious service: Not on file    Active member of club or organization: Not on file    Attends meetings of clubs or organizations: Not on file    Relationship status: Not on file  Other Topics Concern  . Not on file  Social  History Narrative  . Not on file     Family History: The patient's Family history is unknown by patient.  ROS:   Please see the history of present illness.    Unexplained weight loss, decreased appetite, occasional lower extremity swelling, anxiety, rare episodes of orthopnea.  Sees Dr. Michail Sermon for chronic liver disease.  All other systems reviewed and are negative.  EKGs/Labs/Other Studies Reviewed:    The following studies were reviewed today: 2D Doppler echocardiogram April 2019: Study Conclusions  - Left ventricle: The cavity size was normal. There was mild   concentric hypertrophy. Systolic function was normal. The   estimated ejection fraction was in the range of 50% to 55%. Wall   motion was normal; there were no regional wall motion   abnormalities. The study is not technically sufficient to allow   evaluation of LV diastolic function. - Aortic valve: Transvalvular velocity was within the normal range.   There was no stenosis. There was mild regurgitation.   Regurgitation pressure half-time: 674 ms. - Mitral valve: A mechanical prosthesis was present. Transvalvular   velocity was within the normal range. There was no evidence for   stenosis. There was no regurgitation. Pressure half-time: 78 ms.   Valve area by continuity equation (using LVOT flow): 1.71 cm^2. - Right ventricle: The cavity size was mildly dilated. Wall   thickness was normal. Systolic function was moderately reduced. - Atrial septum: No defect or patent foramen ovale was identified. - Tricuspid valve: There was mild regurgitation. - Pulmonary arteries: Systolic pressure was within the normal   range. PA peak pressure: 38 mm Hg (S). EKG:  EKG not performed on today's visit.  Most recently performed on 03/06/2018 and demonstrating atrial fibrillation, controlled ventricular response, right axis deviation, and prominent voltage.  Recent Labs: 03/04/2018: ALT 48; B Natriuretic Peptide 523.2; BUN 20;  Creatinine, Ser 2.16; Hemoglobin 10.5; Platelets 92; Potassium 3.6; Sodium 139  Recent Lipid Panel No results found for: CHOL, TRIG, HDL, CHOLHDL, VLDL, LDLCALC, LDLDIRECT  Physical Exam:    VS:  BP 102/62   Pulse 61   Ht 6\' 1"  (1.854 m)   Wt 146 lb 6.4 oz (66.4 kg)   SpO2 98%   BMI 19.32 kg/m     Wt Readings from Last 3 Encounters:  05/15/18 146 lb 6.4 oz (66.4 kg)  03/14/18 149 lb (67.6 kg)  03/07/18 147 lb (66.7 kg)     GEN: Cachectic appearing.  Lose losing muscle mass.. No acute distress HEENT: Normal NECK: Marked C V wave right jugular with the patient sitting LYMPHATICS: No lymphadenopathy CARDIAC: IIRR.  Low pitched 2/6  holosystolic left lower sternal tricuspid regurgitation murmur, no gallop, trace ankle edema VASCULAR: 2+ bilateral carotid pulses, no bruits RESPIRATORY:  Clear to auscultation without rales, wheezing or rhonchi  ABDOMEN: Soft, non-tender, non-distended, No pulsatile mass, MUSCULOSKELETAL: No deformity  SKIN: Warm and dry NEUROLOGIC:  Alert and oriented x 3 PSYCHIATRIC:  Normal affect   ASSESSMENT:    1. Mechanical mitral valve with surgery x3   2. Chronic anticoagulation - coumadin   3. Chronic diastolic congestive heart failure ([place])   4. Thoracic aortic aneurysm without rupture ([place])   5. Moderate protein-calorie malnutrition ([place])    PLAN:    In order of problems listed above:  1. Normal mechanical valve function without evidence of regurgitation. 2. Coumadin therapy required because of mechanical mitral valve. 3. Volume sensitive diastolic heart failure.  Last EF 50% less than a year ago.  Low-salt diet discussed in detail. 4. Stable aortic root size. 5. Seems to have significant muscle loss.  Difficult to determine whether the heart or liver is driving the apparent malnutrition.  Suspect related to combination.  Encouraged increased caloric intake via carbohydrates and to discuss the safety of increased protein intake with Dr. Michail Sermon  on appointment tomorrow.  Clinical follow-up from cardiology standpoint in 1 year.  Will need imaging in 1 year when seen to ensure normal valvular function and to reassess RV function.   Medication Adjustments/Labs and Tests Ordered: Current medicines are reviewed at length with the patient today.  Concerns regarding medicines are outlined above.  No orders of the defined types were placed in this encounter.  No orders of the defined types were placed in this encounter.   Patient Instructions  Medication Instructions:  Your provider recommends that you continue on your current medications as directed. Please refer to the Current Medication list given to you today.    Labwork: None  Testing/Procedures: None  Follow-Up: Your provider wants you to follow-up in: 1 year with Dr. Tamala Julian or his assistant. You will receive a reminder letter in the mail two months in advance. If you don't receive a letter, please call our office to schedule the follow-up appointment.    Any Other Special Instructions Will Be Listed Below (If Applicable).  Low-Sodium Eating Plan Sodium, which is an element that makes up salt, helps you maintain a healthy balance of fluids in your body. Too much sodium can increase your blood pressure and cause fluid and waste to be held in your body. Your health care provider or dietitian may recommend following this plan if you have high blood pressure (hypertension), kidney disease, liver disease, or heart failure. Eating less sodium can help lower your blood pressure, reduce swelling, and protect your heart, liver, and kidneys. What are tips for following this plan? General guidelines  Most people on this plan should limit their sodium intake to 1,500-2,000 mg (milligrams) of sodium each day. Reading food labels   The Nutrition Facts label lists the amount of sodium in one serving of the food. If you eat more than one serving, you must multiply the listed amount of  sodium by the number of servings.  Choose foods with less than 140 mg of sodium per serving.  Avoid foods with 300 mg of sodium or more per serving. Shopping  Look for lower-sodium products, often labeled as "low-sodium" or "no salt added."  Always check the sodium content even if foods are labeled as "unsalted" or "no salt added".  Buy fresh foods. ? Avoid canned foods and  premade or frozen meals. ? Avoid canned, cured, or processed meats  Buy breads that have less than 80 mg of sodium per slice. Cooking  Eat more home-cooked food and less restaurant, buffet, and fast food.  Avoid adding salt when cooking. Use salt-free seasonings or herbs instead of table salt or sea salt. Check with your health care provider or pharmacist before using salt substitutes.  Cook with plant-based oils, such as canola, sunflower, or olive oil. Meal planning  When eating at a restaurant, ask that your food be prepared with less salt or no salt, if possible.  Avoid foods that contain MSG (monosodium glutamate). MSG is sometimes added to [place] food, bouillon, and some canned foods. What foods are recommended? The items listed may not be a complete list. Talk with your dietitian about what dietary choices are best for you. Grains Low-sodium cereals, including oats, puffed wheat and rice, and shredded wheat. Low-sodium crackers. Unsalted rice. Unsalted pasta. Low-sodium bread. Whole-grain breads and whole-grain pasta. Vegetables Fresh or frozen vegetables. "No salt added" canned vegetables. "No salt added" tomato sauce and paste. Low-sodium or reduced-sodium tomato and vegetable juice. Fruits Fresh, frozen, or canned fruit. Fruit juice. Meats and other protein foods Fresh or frozen (no salt added) meat, poultry, seafood, and fish. Low-sodium canned tuna and salmon. Unsalted nuts. Dried peas, beans, and lentils without added salt. Unsalted canned beans. Eggs. Unsalted nut butters. Dairy Milk. Soy  milk. Cheese that is naturally low in sodium, such as ricotta cheese, fresh mozzarella, or Swiss cheese Low-sodium or reduced-sodium cheese. Cream cheese. Yogurt. Fats and oils Unsalted butter. Unsalted margarine with no trans fat. Vegetable oils such as canola or olive oils. Seasonings and other foods Fresh and dried herbs and spices. Salt-free seasonings. Low-sodium mustard and ketchup. Sodium-free salad dressing. Sodium-free light mayonnaise. Fresh or refrigerated horseradish. Lemon juice. Vinegar. Homemade, reduced-sodium, or low-sodium soups. Unsalted popcorn and pretzels. Low-salt or salt-free chips. What foods are not recommended? The items listed may not be a complete list. Talk with your dietitian about what dietary choices are best for you. Grains Instant hot cereals. Bread stuffing, pancake, and biscuit mixes. Croutons. Seasoned rice or pasta mixes. Noodle soup cups. Boxed or frozen macaroni and cheese. Regular salted crackers. Self-rising flour. Vegetables Sauerkraut, pickled vegetables, and relishes. Olives. [place] fries. Onion rings. Regular canned vegetables (not low-sodium or reduced-sodium). Regular canned tomato sauce and paste (not low-sodium or reduced-sodium). Regular tomato and vegetable juice (not low-sodium or reduced-sodium). Frozen vegetables in sauces. Meats and other protein foods Meat or fish that is salted, canned, smoked, spiced, or pickled. Bacon, ham, sausage, hotdogs, corned beef, chipped beef, packaged lunch meats, salt pork, jerky, pickled herring, anchovies, regular canned tuna, sardines, salted nuts. Dairy Processed cheese and cheese spreads. Cheese curds. Blue cheese. Feta cheese. String cheese. Regular cottage cheese. Buttermilk. Canned milk. Fats and oils Salted butter. Regular margarine. Ghee. Bacon fat. Seasonings and other foods Onion salt, garlic salt, seasoned salt, table salt, and sea salt. Canned and packaged gravies. Worcestershire sauce. [place]  sauce. Barbecue sauce. Teriyaki sauce. Soy sauce, including reduced-sodium. Steak sauce. Fish sauce. Oyster sauce. Cocktail sauce. Horseradish that you find on the shelf. Regular ketchup and mustard. Meat flavorings and tenderizers. Bouillon cubes. Hot sauce and Tabasco sauce. Premade or packaged marinades. Premade or packaged taco seasonings. Relishes. Regular salad dressings. Salsa. Potato and tortilla chips. Corn chips and puffs. Salted popcorn and pretzels. Canned or dried soups. Pizza. Frozen entrees and pot pies. Summary  Eating less sodium can  help lower your blood pressure, reduce swelling, and protect your heart, liver, and kidneys.  Most people on this plan should limit their sodium intake to 1,500-2,000 mg (milligrams) of sodium each day.  Canned, boxed, and frozen foods are high in sodium. Restaurant foods, fast foods, and pizza are also very high in sodium. You also get sodium by adding salt to food.  Try to cook at home, eat more fresh fruits and vegetables, and eat less fast food, canned, processed, or prepared foods. This information is not intended to replace advice given to you by your health care provider. Make sure you discuss any questions you have with your health care provider. Document Released: 09/11/2001 Document Revised: 03/15/2016 Document Reviewed: 03/15/2016 Elsevier Interactive Patient Education  2019 [place], Sinclair Grooms, MD  05/15/2018 9:35 AM    [place]

## 2018-05-15 ENCOUNTER — Ambulatory Visit (INDEPENDENT_AMBULATORY_CARE_PROVIDER_SITE_OTHER): Payer: 59 | Admitting: Interventional Cardiology

## 2018-05-15 ENCOUNTER — Encounter: Payer: Self-pay | Admitting: Interventional Cardiology

## 2018-05-15 VITALS — BP 102/62 | HR 61 | Ht 73.0 in | Wt 146.4 lb

## 2018-05-15 DIAGNOSIS — I059 Rheumatic mitral valve disease, unspecified: Secondary | ICD-10-CM | POA: Diagnosis not present

## 2018-05-15 DIAGNOSIS — I5032 Chronic diastolic (congestive) heart failure: Secondary | ICD-10-CM

## 2018-05-15 DIAGNOSIS — Z7901 Long term (current) use of anticoagulants: Secondary | ICD-10-CM | POA: Diagnosis not present

## 2018-05-15 DIAGNOSIS — I712 Thoracic aortic aneurysm, without rupture, unspecified: Secondary | ICD-10-CM

## 2018-05-15 DIAGNOSIS — E440 Moderate protein-calorie malnutrition: Secondary | ICD-10-CM

## 2018-05-15 NOTE — Patient Instructions (Signed)
Medication Instructions:  Your provider recommends that you continue on your current medications as directed. Please refer to the Current Medication list given to you today.    Labwork: None  Testing/Procedures: None  Follow-Up: Your provider wants you to follow-up in: 1 year with Dr. Tamala Julian or his assistant. You will receive a reminder letter in the mail two months in advance. If you don't receive a letter, please call our office to schedule the follow-up appointment.    Any Other Special Instructions Will Be Listed Below (If Applicable).  Low-Sodium Eating Plan Sodium, which is an element that makes up salt, helps you maintain a healthy balance of fluids in your body. Too much sodium can increase your blood pressure and cause fluid and waste to be held in your body. Your health care provider or dietitian may recommend following this plan if you have high blood pressure (hypertension), kidney disease, liver disease, or heart failure. Eating less sodium can help lower your blood pressure, reduce swelling, and protect your heart, liver, and kidneys. What are tips for following this plan? General guidelines  Most people on this plan should limit their sodium intake to 1,500-2,000 mg (milligrams) of sodium each day. Reading food labels   The Nutrition Facts label lists the amount of sodium in one serving of the food. If you eat more than one serving, you must multiply the listed amount of sodium by the number of servings.  Choose foods with less than 140 mg of sodium per serving.  Avoid foods with 300 mg of sodium or more per serving. Shopping  Look for lower-sodium products, often labeled as "low-sodium" or "no salt added."  Always check the sodium content even if foods are labeled as "unsalted" or "no salt added".  Buy fresh foods. ? Avoid canned foods and premade or frozen meals. ? Avoid canned, cured, or processed meats  Buy breads that have less than 80 mg of sodium per  slice. Cooking  Eat more home-cooked food and less restaurant, buffet, and fast food.  Avoid adding salt when cooking. Use salt-free seasonings or herbs instead of table salt or sea salt. Check with your health care provider or pharmacist before using salt substitutes.  Cook with plant-based oils, such as canola, sunflower, or olive oil. Meal planning  When eating at a restaurant, ask that your food be prepared with less salt or no salt, if possible.  Avoid foods that contain MSG (monosodium glutamate). MSG is sometimes added to [place] food, bouillon, and some canned foods. What foods are recommended? The items listed may not be a complete list. Talk with your dietitian about what dietary choices are best for you. Grains Low-sodium cereals, including oats, puffed wheat and rice, and shredded wheat. Low-sodium crackers. Unsalted rice. Unsalted pasta. Low-sodium bread. Whole-grain breads and whole-grain pasta. Vegetables Fresh or frozen vegetables. "No salt added" canned vegetables. "No salt added" tomato sauce and paste. Low-sodium or reduced-sodium tomato and vegetable juice. Fruits Fresh, frozen, or canned fruit. Fruit juice. Meats and other protein foods Fresh or frozen (no salt added) meat, poultry, seafood, and fish. Low-sodium canned tuna and salmon. Unsalted nuts. Dried peas, beans, and lentils without added salt. Unsalted canned beans. Eggs. Unsalted nut butters. Dairy Milk. Soy milk. Cheese that is naturally low in sodium, such as ricotta cheese, fresh mozzarella, or Swiss cheese Low-sodium or reduced-sodium cheese. Cream cheese. Yogurt. Fats and oils Unsalted butter. Unsalted margarine with no trans fat. Vegetable oils such as canola or olive oils. Seasonings and  other foods Fresh and dried herbs and spices. Salt-free seasonings. Low-sodium mustard and ketchup. Sodium-free salad dressing. Sodium-free light mayonnaise. Fresh or refrigerated horseradish. Lemon juice. Vinegar.  Homemade, reduced-sodium, or low-sodium soups. Unsalted popcorn and pretzels. Low-salt or salt-free chips. What foods are not recommended? The items listed may not be a complete list. Talk with your dietitian about what dietary choices are best for you. Grains Instant hot cereals. Bread stuffing, pancake, and biscuit mixes. Croutons. Seasoned rice or pasta mixes. Noodle soup cups. Boxed or frozen macaroni and cheese. Regular salted crackers. Self-rising flour. Vegetables Sauerkraut, pickled vegetables, and relishes. Olives. [place] fries. Onion rings. Regular canned vegetables (not low-sodium or reduced-sodium). Regular canned tomato sauce and paste (not low-sodium or reduced-sodium). Regular tomato and vegetable juice (not low-sodium or reduced-sodium). Frozen vegetables in sauces. Meats and other protein foods Meat or fish that is salted, canned, smoked, spiced, or pickled. Bacon, ham, sausage, hotdogs, corned beef, chipped beef, packaged lunch meats, salt pork, jerky, pickled herring, anchovies, regular canned tuna, sardines, salted nuts. Dairy Processed cheese and cheese spreads. Cheese curds. Blue cheese. Feta cheese. String cheese. Regular cottage cheese. Buttermilk. Canned milk. Fats and oils Salted butter. Regular margarine. Ghee. Bacon fat. Seasonings and other foods Onion salt, garlic salt, seasoned salt, table salt, and sea salt. Canned and packaged gravies. Worcestershire sauce. Tartar sauce. Barbecue sauce. Teriyaki sauce. Soy sauce, including reduced-sodium. Steak sauce. Fish sauce. Oyster sauce. Cocktail sauce. Horseradish that you find on the shelf. Regular ketchup and mustard. Meat flavorings and tenderizers. Bouillon cubes. Hot sauce and Tabasco sauce. Premade or packaged marinades. Premade or packaged taco seasonings. Relishes. Regular salad dressings. Salsa. Potato and tortilla chips. Corn chips and puffs. Salted popcorn and pretzels. Canned or dried soups. Pizza. Frozen entrees and  pot pies. Summary  Eating less sodium can help lower your blood pressure, reduce swelling, and protect your heart, liver, and kidneys.  Most people on this plan should limit their sodium intake to 1,500-2,000 mg (milligrams) of sodium each day.  Canned, boxed, and frozen foods are high in sodium. Restaurant foods, fast foods, and pizza are also very high in sodium. You also get sodium by adding salt to food.  Try to cook at home, eat more fresh fruits and vegetables, and eat less fast food, canned, processed, or prepared foods. This information is not intended to replace advice given to you by your health care provider. Make sure you discuss any questions you have with your health care provider. Document Released: 09/11/2001 Document Revised: 03/15/2016 Document Reviewed: 03/15/2016 Elsevier Interactive Patient Education  2019 [employer].

## 2018-05-23 ENCOUNTER — Other Ambulatory Visit: Payer: Self-pay | Admitting: Gastroenterology

## 2018-05-23 ENCOUNTER — Telehealth: Payer: Self-pay | Admitting: Interventional Cardiology

## 2018-05-23 DIAGNOSIS — K7460 Unspecified cirrhosis of liver: Secondary | ICD-10-CM

## 2018-05-23 NOTE — Telephone Encounter (Signed)
Pt states that he has been having intermittent dizziness since Monday.  Seen GI today and they told him to contact PCP or Cardiology.  BP today was 130/74, and yesterday was normal but didn't have numbers.  Denies CP, SOB, or any other issues.  Just "doesn't feel right".  Head spun so bad Monday that he threw up.  Advised pt to reach out to PCP as he may have vertigo or could have a virus that is causing issues.  Pt verbalized understanding and was in agreement with this plan.

## 2018-05-23 NOTE — Telephone Encounter (Signed)
  STAT if patient feels like he/she is going to faint   1) Are you dizzy now? Yes but not bad  2) Do you feel faint or have you passed out? no  3) Do you have any other symptoms? Had emesis when he first started having dizzy spells this past Monday  4) Have you checked your HR and BP (record if available)? 130/74 today

## 2018-06-06 NOTE — Progress Notes (Signed)
[facility]    Provider:  Dr Jaynee Eagles Referring Provider: Hulan Fess, MD Primary Care Provider:  Hulan Fess, MD  CC:  dizziness  HPI:  [name] is a 68 y.o. male here as requested by provider Hulan Fess, MD for dizzy spells.  He has a past medical history of erectile dysfunction, TIA, aortic aneurysm without rupture, A. fib, and cirrhosis, chronic hep C, chronic kidney disease stage III, chronic diastolic heart failure, mitral valve disease, elevated CK, long-term use of anticoagulant.  Patient is on Coumadin chronically.  Rheumatic heart disease with mitral valve replacement x3 followed by cardiology.  He had one episode of dizziness. He denies any prior episodes like this. He had dizziness 10 years at one point associated with double vision and was seen in 2011 by neurologist Dr. Doy Mince with work-up recommended but he declined. He says this is different, much worse. Unknown triggers, no prior illnesses. He rolled over in bed one morning and the whole room was spinning. He vomited, describes it as severe. Lasted 24 hours. Denied CP or SOB or other focal neuro symptoms. He did not associate it with turning the head but it did happen in the morning when rolling out of bed.  He took it easy for a few days. He felt "off" for days after. Resolved hasn't had it since. In the past he had a mitral valve repair he had episode but now and then he has been fine. This is the first time this happened. He has been more tired since then, he doesn't get enough sleep maybe 5 hours at night. No other focal neurologic deficits, associated symptoms, inciting events or modifiable factors.  Reviewed notes, labs and imaging from outside physicians, which showed:  Labs taken May 23, 2018 showed unremarkable CBC with differential, BUN 22 and creatinine 1.84, AST 93 and ALT 35.  Reviewed patient's notes he reports an episode of room spinning vertigo which was not positional,  constant initially, last time he was seen he was not symptomatic.  Etiologies considered were viral labyrinthitis but also posterior circulation etiologies.  He declined MRI due to insurance issues.  He has a long history at least 10 years of dizzy spells.  He was evaluated by Dr. Doy Mince in 2011 and recommended MRI testing.  At last visit with Dr. Rex Kras on May 24, 2018, he awoke with constant room spinning vertigo that was not positional, associated with nausea and vomited 1 time, no hearing changes or tinnitus.  It was worse when he stood up.  And over the course of the day symptoms got gradually better and when he was seen in the office he was not having symptoms.  Exam was consistent with irregularly irregular rhythm and grade 1/6 to 2/6 systolic murmur.   Review of Systems: Patient complains of symptoms per HPI as well as the following symptoms: dizziness, afib. Pertinent negatives and positives per HPI. All others negative.   Social History   Socioeconomic History  . Marital status: Divorced    Spouse name: Not on file  . Number of children: 2  . Years of education: Not on file  . Highest education level: Some college, no degree  Occupational History  . Not on file  Social Needs  . Financial resource strain: Not on file  . Food insecurity:    Worry: Not on file    Inability: Not on file  . Transportation needs:    Medical: Not on file    Non-medical: Not on  file  Tobacco Use  . Smoking status: Former Smoker    Types: Cigars    Last attempt to quit: 09/03/2017    Years since quitting: 0.7  . Smokeless tobacco: Never Used  Substance and Sexual Activity  . Alcohol use: No  . Drug use: No  . Sexual activity: Not on file  Lifestyle  . Physical activity:    Days per week: Not on file    Minutes per session: Not on file  . Stress: Not on file  Relationships  . Social connections:    Talks on phone: Not on file    Gets together: Not on file    Attends religious service:  Not on file    Active member of club or organization: Not on file    Attends meetings of clubs or organizations: Not on file    Relationship status: Not on file  . Intimate partner violence:    Fear of current or ex partner: Not on file    Emotionally abused: Not on file    Physically abused: Not on file    Forced sexual activity: Not on file  Other Topics Concern  . Not on file  Social History Narrative   Lives at home alone    Left handed   Caffeine: 1-2 cups daily    Family History  Family history unknown: Yes    Past Medical History:  Diagnosis Date  . Allergy   . Aneurysm of ascending aorta (HCC)   . Atrial fibrillation ([place])   . BPH (benign prostatic hyperplasia)   . Colon polyps   . FHx: rheumatic heart disease   . H/O diplopia   . Hepatitis C   . Hypertension   . Hypogonadism male   . Mitral valve disease     Patient Active Problem List   Diagnosis Date Noted  . Vertigo 06/07/2018  . Acute on chronic diastolic heart failure ([place]) 03/04/2018  . Cirrhosis ([place]) 05/08/2014  . Chronic atrial flutter ([place]) 06/05/2013  . Hepatitis C 06/05/2013  . Chronic diastolic heart failure ([place]) 06/05/2013  . Encounter for therapeutic drug monitoring 04/30/2013  . 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 History of colonic polyps 11/14/2012  . Thoracic aortic aneurysm ([place]) 05/02/2012    Past Surgical History:  Procedure Laterality Date  . COLON SURGERY    . COLONOSCOPY    . COLONOSCOPY N/A 11/14/2012   Procedure: COLONOSCOPY;  Surgeon: Lear Ng, MD;  Location: [facility];  Service: Endoscopy;  Laterality: N/A;  . ESOPHAGOGASTRODUODENOSCOPY (EGD) WITH PROPOFOL N/A 05/08/2014   Procedure: ESOPHAGOGASTRODUODENOSCOPY (EGD) WITH PROPOFOL;  Surgeon: Lear Ng, MD;  Location: [place];  Service: Endoscopy;  Laterality: N/A;  . MITRAL VALVE REPLACEMENT      Current Outpatient Medications  Medication Sig Dispense Refill  . furosemide  (LASIX) 20 MG tablet Take 20 mg by mouth daily.     . IRON PO Take 1 tablet by mouth every other day.    . Multiple Vitamin (MULTIVITAMIN WITH MINERALS) TABS tablet Take 1 tablet by mouth daily.    . sildenafil (VIAGRA) 100 MG tablet Take 100 mg by mouth as needed.    . testosterone cypionate (DEPOTESTOSTERONE CYPIONATE) 200 MG/ML injection Inject 200 mg into the muscle every Thursday.     . warfarin (COUMADIN) 5 MG tablet Take 5 mg by mouth See admin instructions. Take 5 mg every day except Friday take 7.5 mg    . amoxicillin (AMOXIL) 500 MG  capsule TAKE FOUR CAPSULES BY MOUTH 1 HOUR BEFORE DENTAL APPOINTMENT (Patient not taking: Reported on 06/07/2018) 4 capsule 0  . meclizine (ANTIVERT) 25 MG tablet Take 1 tablet (25 mg total) by mouth 3 (three) times daily as needed for dizziness. 30 tablet 6  . ondansetron (ZOFRAN-ODT) 4 MG disintegrating tablet Take 1 tablet (4 mg total) by mouth every 8 (eight) hours as needed for nausea. 30 tablet 3   No current facility-administered medications for this visit.     Allergies as of 06/07/2018 - Review Complete 06/07/2018  Allergen Reaction Noted  . Penicillins Rash 11/27/2011    Vitals: BP 108/68 (BP Location: Right Arm, Patient Position: Sitting)   Pulse (!) 58   Ht 6\' 1"  (1.854 m)   Wt 149 lb (67.6 kg)   BMI 19.66 kg/m  Last Weight:  Wt Readings from Last 1 Encounters:  06/07/18 149 lb (67.6 kg)   Last Height:   Ht Readings from Last 1 Encounters:  06/07/18 6\' 1"  (1.854 m)     Physical exam: Exam: Gen: NAD, conversant                 CV: loud S2 due to mitral valve replacement , no MRG. No Carotid Bruits. No peripheral edema, warm, nontender Eyes: Conjunctivae clear without exudates or hemorrhage  Neuro: Detailed Neurologic Exam  Speech:    Speech is normal; fluent and spontaneous with normal comprehension.  Cognition:    The patient is oriented to person, place, and time;     recent and remote memory intact;     language  fluent;     normal attention, concentration,     fund of knowledge Cranial Nerves:    The pupils are equal, round, and reactive to light. Attempted fundoscopy could not visualize due to small pupils. Visual fields are full to finger confrontation. Extraocular movements are intact. Trigeminal sensation is intact and the muscles of mastication are normal. The face is symmetric. The palate elevates in the midline. Hearing intact. Voice is normal. Shoulder shrug is normal. The tongue has normal motion without fasciculations.   Coordination:    No dysmetria  Gait:    Normal native gait  Motor Observation:    No asymmetry, no atrophy, and no involuntary movements noted. Tone:    Normal muscle tone.    Posture:    Posture is normal. normal erect    Strength:    Strength is V/V in the upper and lower limbs.      Sensation: intact to LT     Reflex Exam:  DTR's:    Deep tendon reflexes in the upper and lower extremities are normal bilaterally.   Toes:    The toes are downgoing bilaterally.   Clonus:    Clonus is absent.    Assessment/Plan:  [name] is a 68 y.o. male here as requested by provider Rex Kras, Lennette Bihari, MD for dizzy spells.  He has a past medical history of erectile dysfunction, TIA, aortic aneurysm without rupture, A. fib, and cirrhosis, chronic hep C, chronic kidney disease stage III, chronic diastolic heart failure, mitral valve disease, elevated CK, long-term use of anticoagulant.  Patient is on Coumadin chronically.  Rheumatic heart disease with mitral valve replacement x3 followed by cardiology.  Patient had an episode of severe vertigo when rolling out of bed one morning.  It lasted 24 hours unclear if positional or not.  There is a wide differential for vertigo including labyrinthitis, benign positional vertigo, strokes, lesions  of the brain, space-occupying masses, lesions cranial nerves, vestibular migraines and many others.  Recommend MRI of the brain with and  without contrast.  Also recommend CTA of the head and neck.  Given patient's history of TIA, A. fib, mitral valve replacement on Coumadin we should evaluate for stroke.  Patient at this time declined work-up due to insurance reasons but he will reconsider when he transitions to Medicare.  Prescribed medications, meclizine and Zofran if episode happens again and encouraged him to call me.  Discussed vestibular therapy if needed.  Also discussed need to go to the emergency room for severe or new symptoms of unknown etiology.   Cc: Hulan Fess, MD,    Sarina Ill, MD  [facility] [address] [place], [place] [address]  Phone [phone] Fax [phone]

## 2018-06-07 ENCOUNTER — Encounter: Payer: Self-pay | Admitting: Neurology

## 2018-06-07 ENCOUNTER — Ambulatory Visit (INDEPENDENT_AMBULATORY_CARE_PROVIDER_SITE_OTHER): Payer: 59 | Admitting: Neurology

## 2018-06-07 VITALS — BP 108/68 | HR 58 | Ht 73.0 in | Wt 149.0 lb

## 2018-06-07 DIAGNOSIS — R42 Dizziness and giddiness: Secondary | ICD-10-CM | POA: Diagnosis not present

## 2018-06-07 MED ORDER — ONDANSETRON 4 MG PO TBDP: 4.0000 mg | ORAL_TABLET | Freq: Three times a day (TID) | ORAL | 3 refills | Status: DC | PRN

## 2018-06-07 MED ORDER — MECLIZINE HCL 25 MG PO TABS: 25.0000 mg | ORAL_TABLET | Freq: Three times a day (TID) | ORAL | 6 refills | Status: DC | PRN

## 2018-06-07 NOTE — Patient Instructions (Signed)
Vertigo  Vertigo is the feeling that you or your surroundings are moving when they are not. Vertigo can be dangerous if it occurs while you are doing something that could endanger you or others, such as driving.  What are the causes?  This condition is caused by a disturbance in the signals that are sent by your body’s sensory systems to your brain. Different causes of a disturbance can lead to vertigo, including:  · Infections, especially in the inner ear.  · A bad reaction to a drug, or misuse of alcohol and medicines.  · Withdrawal from drugs or alcohol.  · Quickly changing positions, as when lying down or rolling over in bed.  · Migraine headaches.  · Decreased blood flow to the brain.  · Decreased blood pressure.  · Increased pressure in the brain from a head or neck injury, stroke, infection, tumor, or bleeding.  · Central nervous system disorders.  What are the signs or symptoms?  Symptoms of this condition usually occur when you move your head or your eyes in different directions. Symptoms may start suddenly, and they usually last for less than a minute. Symptoms may include:  · Loss of balance and falling.  · Feeling like you are spinning or moving.  · Feeling like your surroundings are spinning or moving.  · Nausea and vomiting.  · Blurred vision or double vision.  · Difficulty hearing.  · Slurred speech.  · Dizziness.  · Involuntary eye movement (nystagmus).  Symptoms can be mild and cause only slight annoyance, or they can be severe and interfere with daily life. Episodes of vertigo may return (recur) over time, and they are often triggered by certain movements. Symptoms may improve over time.  How is this diagnosed?  This condition may be diagnosed based on medical history and the quality of your nystagmus. Your health care provider may test your eye movements by asking you to quickly change positions to trigger the nystagmus. This may be called the Dix-Hallpike test, head thrust test, or roll test. You  may be referred to a health care provider who specializes in ear, nose, and throat (ENT) problems (otolaryngologist) or a provider who specializes in disorders of the central nervous system (neurologist).  You may have additional testing, including:  · A physical exam.  · Blood tests.  · MRI.  · A CT scan.  · An electrocardiogram (ECG). This records electrical activity in your heart.  · An electroencephalogram (EEG). This records electrical activity in your brain.  · Hearing tests.  How is this treated?  Treatment for this condition depends on the cause and the severity of the symptoms. Treatment options include:  · Medicines to treat nausea or vertigo. These are usually used for severe cases. Some medicines that are used to treat other conditions may also reduce or eliminate vertigo symptoms. These include:  ? Medicines that control allergies (antihistamines).  ? Medicines that control seizures (anticonvulsants).  ? Medicines that relieve depression (antidepressants).  ? Medicines that relieve anxiety (sedatives).  · Head movements to adjust your inner ear back to normal. If your vertigo is caused by an ear problem, your health care provider may recommend certain movements to correct the problem.  · Surgery. This is rare.  Follow these instructions at home:  Safety  · Move slowly.Avoid sudden body or head movements.  · Avoid driving.  · Avoid operating heavy machinery.  · Avoid doing any tasks that would cause danger to you or others if   your health care provider what activities are safe for you. General instructions  Take over-the-counter and prescription medicines only as told by your health care provider.  Avoid certain positions or  movements as told by your health care provider.  Drink enough fluid to keep your urine clear or pale yellow.  Keep all follow-up visits as told by your health care provider. This is important. Contact a health care provider if:  Your medicines do not relieve your vertigo or they make it worse.  You have a fever.  Your condition gets worse or you develop new symptoms.  Your family or friends notice any behavioral changes.  Your nausea or vomiting gets worse.  You have numbness or a "pins and needles" sensation in part of your body. Get help right away if:  You have difficulty moving or speaking.  You are always dizzy.  You faint.  You develop severe headaches.  You have weakness in your hands, arms, or legs.  You have changes in your hearing or vision.  You develop a stiff neck.  You develop sensitivity to light. This information is not intended to replace advice given to you by your health care provider. Make sure you discuss any questions you have with your health care provider. Document Released: 12/30/2004 Document Revised: 09/03/2015 Document Reviewed: 07/15/2014 Elsevier Interactive Patient Education  2019 [place] tablets or capsules What is this medicine? MECLIZINE (MEK li zeen) is an antihistamine. It is used to prevent nausea, vomiting, or dizziness caused by motion sickness. It is also used to prevent and treat vertigo (extreme dizziness or a feeling that you or your surroundings are tilting or spinning around). This medicine may be used for other purposes; ask your health care provider or pharmacist if you have questions. COMMON BRAND NAME(S): Antivert, Dramamine Less Drowsy, Dramamine-N, Medivert, Meni-D What should I tell my health care provider before I take this medicine? They need to know if you have any of these conditions: -glaucoma -lung or breathing disease, like asthma -problems urinating -prostate disease -stomach or intestine  problems -an unusual or allergic reaction to meclizine, other medicines, foods, dyes, or preservatives -pregnant or trying to get pregnant -breast-feeding How should I use this medicine? Take this medicine by mouth with a glass of water. Follow the directions on the prescription label. If you are using this medicine to prevent motion sickness, take the dose at least 1 hour before travel. If it upsets your stomach, take it with food or milk. Take your doses at regular intervals. Do not take your medicine more often than directed. Talk to your pediatrician regarding the use of this medicine in children. Special care may be needed. Overdosage: If you think you have taken too much of this medicine contact a poison control center or emergency room at once. NOTE: This medicine is only for you. Do not share this medicine with others. What if I miss a dose? If you miss a dose, take it as soon as you can. If it is almost time for your next dose, take only that dose. Do not take double or extra doses. What may interact with this medicine? Do not take this medicine with any of the following medications: -MAOIs like Carbex, Eldepryl, Marplan, Nardil, and Parnate This medicine may also interact with the following medications: -alcohol -antihistamines for allergy, cough and cold -certain medicines for anxiety or sleep -certain medicines for depression, like amitriptyline, fluoxetine, sertraline -certain medicines for seizures like phenobarbital, primidone -general anesthetics  like halothane, isoflurane, methoxyflurane, propofol -local anesthetics like lidocaine, pramoxine, tetracaine -medicines that relax muscles for surgery -narcotic medicines for pain -phenothiazines like chlorpromazine, mesoridazine, prochlorperazine, thioridazine This list may not describe all possible interactions. Give your health care provider a list of all the medicines, herbs, non-prescription drugs, or dietary supplements you  use. Also tell them if you smoke, drink alcohol, or use illegal drugs. Some items may interact with your medicine. What should I watch for while using this medicine? Tell your doctor or healthcare professional if your symptoms do not start to get better or if they get worse. You may get drowsy or dizzy. Do not drive, use machinery, or do anything that needs mental alertness until you know how this medicine affects you. Do not stand or sit up quickly, especially if you are an older patient. This reduces the risk of dizzy or fainting spells. Alcohol may interfere with the effect of this medicine. Avoid alcoholic drinks. Your mouth may get dry. Chewing sugarless gum or sucking hard candy, and drinking plenty of water may help. Contact your doctor if the problem does not go away or is severe. This medicine may cause dry eyes and blurred vision. If you wear contact lenses you may feel some discomfort. Lubricating drops may help. See your eye doctor if the problem does not go away or is severe. What side effects may I notice from receiving this medicine? Side effects that you should report to your doctor or health care professional as soon as possible: -feeling faint or lightheaded, falls -fast, irregular heartbeat Side effects that usually do not require medical attention (report to your doctor or health care professional if they continue or are bothersome): -constipation -headache -trouble passing urine or change in the amount of urine -trouble sleeping -upset stomach This list may not describe all possible side effects. Call your doctor for medical advice about side effects. You may report side effects to FDA at 1-800-FDA-1088. Where should I keep my medicine? Keep out of the reach of children. Store at room temperature between 15 and 30 degrees C (59 and 86 degrees F). Keep container tightly closed. Throw away any unused medicine after the expiration date. NOTE: This sheet is a summary. It may not  cover all possible information. If you have questions about this medicine, talk to your doctor, pharmacist, or health care provider.  2019 Elsevier/Gold Standard (2015-04-23 19:41:02)   Ondansetron oral dissolving tablet What is this medicine? ONDANSETRON (on DAN se tron) is used to treat nausea and vomiting caused by chemotherapy. It is also used to prevent or treat nausea and vomiting after surgery. This medicine may be used for other purposes; ask your health care provider or pharmacist if you have questions. COMMON BRAND NAME(S): Zofran ODT What should I tell my health care provider before I take this medicine? They need to know if you have any of these conditions: -heart disease -history of irregular heartbeat -liver disease -low levels of magnesium or potassium in the blood -an unusual or allergic reaction to ondansetron, granisetron, other medicines, foods, dyes, or preservatives -pregnant or trying to get pregnant -breast-feeding How should I use this medicine? These tablets are made to dissolve in the mouth. Do not try to push the tablet through the foil backing. With dry hands, peel away the foil backing and gently remove the tablet. Place the tablet in the mouth and allow it to dissolve, then swallow. While you may take these tablets with water, it is not necessary to  do so. Talk to your pediatrician regarding the use of this medicine in children. Special care may be needed. Overdosage: If you think you have taken too much of this medicine contact a poison control center or emergency room at once. NOTE: This medicine is only for you. Do not share this medicine with others. What if I miss a dose? If you miss a dose, take it as soon as you can. If it is almost time for your next dose, take only that dose. Do not take double or extra doses. What may interact with this medicine? Do not take this medicine with any of the following medications: -apomorphine -certain medicines for  fungal infections like fluconazole, itraconazole, ketoconazole, posaconazole, voriconazole -cisapride -dofetilide -dronedarone -pimozide -thioridazine -ziprasidone This medicine may also interact with the following medications: -carbamazepine -certain medicines for depression, anxiety, or psychotic disturbances -fentanyl -linezolid -MAOIs like Carbex, Eldepryl, Marplan, Nardil, and Parnate -methylene blue (injected into a vein) -other medicines that prolong the QT interval (cause an abnormal heart rhythm) -phenytoin -rifampicin -tramadol This list may not describe all possible interactions. Give your health care provider a list of all the medicines, herbs, non-prescription drugs, or dietary supplements you use. Also tell them if you smoke, drink alcohol, or use illegal drugs. Some items may interact with your medicine. What should I watch for while using this medicine? Check with your doctor or health care professional as soon as you can if you have any sign of an allergic reaction. What side effects may I notice from receiving this medicine? Side effects that you should report to your doctor or health care professional as soon as possible: -allergic reactions like skin rash, itching or hives, swelling of the face, lips, or tongue -breathing problems -confusion -dizziness -fast or irregular heartbeat -feeling faint or lightheaded, falls -fever and chills -loss of balance or coordination -seizures -sweating -swelling of the hands and feet -tightness in the chest -tremors -unusually weak or tired Side effects that usually do not require medical attention (report to your doctor or health care professional if they continue or are bothersome): -constipation or diarrhea -headache This list may not describe all possible side effects. Call your doctor for medical advice about side effects. You may report side effects to FDA at 1-800-FDA-1088. Where should I keep my medicine? Keep  out of the reach of children. Store between 2 and 30 degrees C (36 and 86 degrees F). Throw away any unused medicine after the expiration date. NOTE: This sheet is a summary. It may not cover all possible information. If you have questions about this medicine, talk to your doctor, pharmacist, or health care provider.  2019 Elsevier/Gold Standard (2012-12-27 16:21:52)

## 2018-06-13 ENCOUNTER — Ambulatory Visit
Admission: RE | Admit: 2018-06-13 | Discharge: 2018-06-13 | Disposition: A | Discharge status: Home / Self Care | Payer: 59 | Source: Ambulatory Visit | Attending: Gastroenterology | Admitting: Gastroenterology

## 2018-06-13 DIAGNOSIS — K7460 Unspecified cirrhosis of liver: Secondary | ICD-10-CM

## 2018-09-15 ENCOUNTER — Encounter: Payer: Self-pay | Admitting: Internal Medicine

## 2018-09-15 LAB — PROTIME-INR: INR: 3.4 — AB (ref 0.9–1.1)

## 2018-09-18 ENCOUNTER — Other Ambulatory Visit (HOSPITAL_BASED_OUTPATIENT_CLINIC_OR_DEPARTMENT_OTHER): Payer: Self-pay | Admitting: Family Medicine

## 2018-09-18 DIAGNOSIS — R0609 Other forms of dyspnea: Secondary | ICD-10-CM

## 2018-09-19 ENCOUNTER — Ambulatory Visit (HOSPITAL_BASED_OUTPATIENT_CLINIC_OR_DEPARTMENT_OTHER)
Admission: RE | Admit: 2018-09-19 | Discharge: 2018-09-19 | Disposition: A | Discharge status: Home / Self Care | Payer: 59 | Source: Ambulatory Visit | Attending: Family Medicine | Admitting: Family Medicine

## 2018-09-19 ENCOUNTER — Other Ambulatory Visit: Payer: Self-pay

## 2018-09-19 ENCOUNTER — Telehealth: Payer: Self-pay | Admitting: *Deleted

## 2018-09-19 ENCOUNTER — Encounter (HOSPITAL_BASED_OUTPATIENT_CLINIC_OR_DEPARTMENT_OTHER): Payer: Self-pay

## 2018-09-19 DIAGNOSIS — R0609 Other forms of dyspnea: Secondary | ICD-10-CM | POA: Insufficient documentation

## 2018-09-19 MED ORDER — IOHEXOL 350 MG/ML SOLN
100.0000 mL | Freq: Once | INTRAVENOUS | Status: AC | PRN
  Administered 2018-09-19: 100 mL via INTRAVENOUS

## 2018-09-19 NOTE — Telephone Encounter (Signed)
REFERRAL SENT TO SCHEDULING AND NOTES ON FILE FROM DR. Martinsdale.

## 2018-09-21 ENCOUNTER — Telehealth: Payer: Self-pay | Admitting: *Deleted

## 2018-09-21 NOTE — Telephone Encounter (Signed)
Call placed to pt re: appt 09/26/2018 with Cecilie Kicks, NP, to prescreen for covid 19, left a message for pt to call back.

## 2018-09-21 NOTE — Telephone Encounter (Signed)
Pt returned my call and he has been pre=screened [phone] and answered no to all questions.       COVID-19 Pre-Screening Questions:  . In the past 7 to 10 days have you had a cough,  shortness of breath, headache, congestion, fever (100 or greater) body aches, chills, sore throat, or sudden loss of taste or sense of smell? . Have you been around anyone with known Covid 19. . Have you been around anyone who is awaiting Covid 19 test results in the past 7 to 10 days? . Have you been around anyone who has been exposed to Covid 19, or has mentioned symptoms of Covid 19 within the past 7 to 10 days?  If you have any concerns/questions about symptoms patients report during screening (either on the phone or at threshold). Contact the provider seeing the patient or DOD for further guidance.  If neither are available contact a member of the leadership team.

## 2018-09-25 NOTE — Progress Notes (Signed)
Cardiology Office Note   Date:  09/26/2018   ID:  [name] 14-Nov-1950, MRN [id number]  PCP:  Kathyrn Lass, MD  Cardiologist:  Dr. Tamala Julian    Chief Complaint  Patient presents with  . Shortness of Breath  . Weight Loss      History of Present Illness: [name] is a 68 y.o. male who presents for doe and abnromal CTA of chest .  He notes a 10 lb wt loss since Dec.   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 Other problems include hepatitis C, chronic atrial flutter, ascending aortic aneurysm 4.5 cm, chronic kidney disease stage III,chronic anemiaand chronic anticoagulation..  Dizziness saw neuro, but declined MRI of Head at this time.   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    Emphysematous.  AND ECTATIC ASCENDING AORTA AT 4.4 CM   On Echo 07/23/17 Pulmonary artery was normal range with pressure and size.  Tr Pl effusions  Mri ANGIO 2018 Re- demonstration of ascending aortic aneurysm, with diameter on today's study measuring approximately 4.2 cm which is essentially unchanged from the most remote CT study of 2005 when the measurement (measured contemporaneously today) was approximately 4.2 cm.  Re- demonstration of main pulmonary artery enlargement measuring 4.0 cm today, and 3.2 cm in 2005, enlarged over time. This may reflect developing pulmonary hypertension.  He has been DOE only none with exertion.  Recent BNP was elevated. He has lost 10 lbs as well.  He has a good BK but rest of day no particular appetite.   Prior US abd with dx of cirrhosis.  No chest pain.  Recent labs as below.     Past Medical History:  Diagnosis Date  . Allergy   . Aneurysm of ascending aorta ([facility])   . Atrial fibrillation ([place])   . BPH (benign prostatic hyperplasia)    . Colon polyps   . FHx: rheumatic heart disease   . H/O diplopia   . Hepatitis C   . Hypertension   . Hypogonadism male   . Mitral valve disease     Past Surgical History:  Procedure Laterality Date  . COLON SURGERY    . COLONOSCOPY    . COLONOSCOPY N/A 11/14/2012   Procedure: COLONOSCOPY;  Surgeon: Lear Ng, MD;  Location: [facility];  Service: Endoscopy;  Laterality: N/A;  . ESOPHAGOGASTRODUODENOSCOPY (EGD) WITH PROPOFOL N/A 05/08/2014   Procedure: ESOPHAGOGASTRODUODENOSCOPY (EGD) WITH PROPOFOL;  Surgeon: Lear Ng, MD;  Location: [facility];  Service: Endoscopy;  Laterality: N/A;  . MITRAL VALVE REPLACEMENT       Current Outpatient Medications  Medication Sig Dispense Refill  . furosemide (LASIX) 20 MG tablet Take 20 mg by mouth daily.     . IRON PO Take 1 tablet by mouth every other day.    . Multiple Vitamin (MULTIVITAMIN WITH MINERALS) TABS tablet Take 1 tablet by mouth daily.    . sildenafil (VIAGRA) 100 MG tablet Take 100 mg by mouth as needed.    . testosterone cypionate (DEPOTESTOSTERONE CYPIONATE) 200 MG/ML injection Inject 200 mg into the muscle every Thursday.     . warfarin (COUMADIN) 5 MG tablet Take 5 mg by mouth See admin instructions. Take 5 mg every day except Friday take 7.5 mg    .  zolpidem (AMBIEN) 5 MG tablet Take 1-2 tablets by mouth at bedtime as needed.    Marland Kitchen amoxicillin (AMOXIL) 500 MG capsule TAKE FOUR CAPSULES BY MOUTH 1 HOUR BEFORE DENTAL APPOINTMENT (Patient not taking: Reported on 09/26/2018) 4 capsule 0  . ondansetron (ZOFRAN-ODT) 4 MG disintegrating tablet Take 1 tablet (4 mg total) by mouth every 8 (eight) hours as needed for nausea. (Patient not taking: Reported on 09/26/2018) 30 tablet 3   No current facility-administered medications for this visit.     Allergies:   Penicillins    Social History:  The patient  reports that he quit smoking about 12 months ago. His smoking use included cigars. He has never used smokeless  tobacco. He reports that he does not drink alcohol or use drugs.   Family History:  The patient's Family history is unknown by patient.    ROS:  General:no colds or fevers, no weight changes Skin:no rashes or ulcers HEENT:no blurred vision, no congestion CV:see HPI PUL:see HPI GI:no diarrhea constipation or melena, no indigestion GU:no hematuria, no dysuria MS:no joint pain, no claudication Neuro:no syncope, no lightheadedness Endo:no diabetes, no thyroid disease  Wt Readings from Last 3 Encounters:  09/26/18 139 lb (63 kg)  06/07/18 149 lb (67.6 kg)  05/15/18 146 lb 6.4 oz (66.4 kg)     PHYSICAL EXAM: VS:  BP (!) 110/52   Pulse 69   Ht 6\' 1"  (1.854 m)   Wt 139 lb (63 kg)   BMI 18.34 kg/m  , BMI Body mass index is 18.34 kg/m. General:Pleasant affect, NAD Skin:Warm and dry, brisk capillary refill HEENT:normocephalic, sclera clear, mucus membranes moist Neck:supple, no JVD, no bruits  Heart:irreg ireg with holosystolic murmur, no gallup, rub or click Lungs:clear without rales, rhonchi, or wheezes LXB:WIOM, non tender, + BS, do not palpate liver spleen or masses Ext:tr to 1+  lower ext edema Lt > Rt , 2+ pedal pulses, 2+ radial pulses Neuro:alert and oriented X 3, MAE, follows commands, + facial symmetry    EKG:  EKG is ordered today. The ekg ordered today demonstrates chronic a flutter, rate controlled Rt ward axis, PVC and non specific T wave abnormality. No changes compared to   Recent Labs: 03/04/2018: ALT 48; B Natriuretic Peptide 523.2; BUN 20; Creatinine, Ser 2.16; Hemoglobin 10.5; Platelets 92; Potassium 3.6; Sodium 139   LABS 09/15/18 with neg HIV  BNP 1070  Previously 523 Cr 1.93   Lipid Panel No results found for: CHOL, TRIG, HDL, CHOLHDL, VLDL, LDLCALC, LDLDIRECT     Other studies Reviewed: Additional studies/ records that were reviewed today include: . Echo 2014 reviewed.    Cardiac cath 2012 at [facility] Coronary Artery Disease  Left Main:insignificant LAD system:significant LCX system:insignificant RCA system:insignificant Number of vessels with significant CAD:1 - Vessel Right Heart Catheterization Pulmonary Hypertension:Absent  FINAL DIAGNOSIS 394.1Diseases of mitral valve, rheumatic mitral insufficiency 427.31Atrial fibrillation  COMMENT Normal right heart pressures. 90% discrete lesion in normal branching D3.  Echo 07/2017  Study Conclusions  - Left ventricle: The cavity size was normal. There was mild   concentric hypertrophy. Systolic function was normal. The   estimated ejection fraction was in the range of 50% to 55%. Wall   motion was normal; there were no regional wall motion   abnormalities. The study is not technically sufficient to allow   evaluation of LV diastolic function. - Aortic valve: Transvalvular velocity was within the normal range.   There was no stenosis. There was mild regurgitation.  Regurgitation pressure half-time: 674 ms. - Mitral valve: A mechanical prosthesis was present. Transvalvular   velocity was within the normal range. There was no evidence for   stenosis. There was no regurgitation. Pressure half-time: 78 ms.   Valve area by continuity equation (using LVOT flow): 1.71 cm^2. - Right ventricle: The cavity size was mildly dilated. Wall   thickness was normal. Systolic function was moderately reduced. - Atrial septum: No defect or patent foramen ovale was identified. - Tricuspid valve: There was mild regurgitation. - Pulmonary arteries: Systolic pressure was within the normal   range. PA peak pressure: 38 mm Hg (S).  ------------------------------------------------------------------- Labs, prior tests, procedures, and surgery: Valve surgery (1988).     Mitral valve replacement with a mechanical valve.  Valve surgery (1973).     Mitral valve replacement with a Cryolife porcine valve. Valve surgery (2012).      Mitral valve replacement with a mechanical valve.  ASSESSMENT AND PLAN:  1. DOE last cath 2012.  Last echo with normal EF PA pk pressure at 38 mmHg.   Discussed with Dr. Tamala Julian.  With enlarge pulmonary artery will ask Dr. Cyndia Bent to weigh in.  Will check echo.  He is on lasix 20 mg dialy.  Will increase to 40 mg for 2 days then back to 20 mg.   Will follow up in 3-4 weeks.   2.  WT loss per PCP - recommend protein shakes.  Unsure if cardiac status is driving this.  3.  Mechanical mitral valve with surgery X 3.  Last 2012. On coumadin - he would like his INR checks changed to out office.  Will have this checked in 2 weeks.  4.  Chronic diastolic HF see above on lasix- he may have increased salt, but will decrease again.  5.  Chronic a flutter rate controlled, + PVCs but similar to prior EKGs.   6.  thoracic aneurysm per Dr. Cyndia Bent.    7.  Dilated Pulmonary artery per Dr. Cyndia Bent    Current medicines are reviewed with the patient today.  The patient Has no concerns regarding medicines.  The following changes have been made:  See above Labs/ tests ordered today include:see above  Disposition:   FU:  see above  Signed, Cecilie Kicks, NP  09/26/2018 10:13 PM    [facility] [place], [place], [address] [place], [place] Phone: [phone]; Fax: [phone]

## 2018-09-26 ENCOUNTER — Ambulatory Visit (INDEPENDENT_AMBULATORY_CARE_PROVIDER_SITE_OTHER): Payer: 59 | Admitting: Cardiology

## 2018-09-26 ENCOUNTER — Encounter: Payer: Self-pay | Admitting: Cardiology

## 2018-09-26 ENCOUNTER — Encounter: Payer: Self-pay | Admitting: *Deleted

## 2018-09-26 ENCOUNTER — Other Ambulatory Visit: Payer: Self-pay

## 2018-09-26 VITALS — BP 110/52 | HR 69 | Ht 73.0 in | Wt 139.0 lb

## 2018-09-26 DIAGNOSIS — I059 Rheumatic mitral valve disease, unspecified: Secondary | ICD-10-CM | POA: Diagnosis not present

## 2018-09-26 DIAGNOSIS — I517 Cardiomegaly: Secondary | ICD-10-CM

## 2018-09-26 DIAGNOSIS — I712 Thoracic aortic aneurysm, without rupture, unspecified: Secondary | ICD-10-CM

## 2018-09-26 DIAGNOSIS — I5032 Chronic diastolic (congestive) heart failure: Secondary | ICD-10-CM

## 2018-09-26 DIAGNOSIS — Z7901 Long term (current) use of anticoagulants: Secondary | ICD-10-CM

## 2018-09-26 DIAGNOSIS — R0602 Shortness of breath: Secondary | ICD-10-CM

## 2018-09-26 DIAGNOSIS — I4892 Unspecified atrial flutter: Secondary | ICD-10-CM

## 2018-09-26 NOTE — Patient Instructions (Addendum)
Medication Instructions:   Your physician recommends that you continue on your current medications as directed. Please refer to the Current Medication list given to you today.  If you need a refill on your cardiac medications before your next appointment, please call your pharmacy.   Lab work: Labs requested from Primary care doctor   If you have labs (blood work) drawn today and your tests are completely normal, you will receive your results only by: Marland Kitchen MyChart Message (if you have MyChart) OR . A paper copy in the mail If you have any lab test that is abnormal or we need to change your treatment, we will call you to review the results.  Testing/Procedures: Your physician has requested that you have an echocardiogram. Echocardiography is a painless test that uses sound waves to create images of your heart. It provides your doctor with information about the size and shape of your heart and how well your heart's chambers and valves are working. This procedure takes approximately one hour. There are no restrictions for this procedure.   Follow-Up: with the Coumadin Clinic as new patient to start following coumadin checks  At [facility], you and your health needs are our priority.  As part of our continuing mission to provide you with exceptional heart care, we have created designated Provider Care Teams.  These Care Teams include your primary Cardiologist (physician) and Advanced Practice Providers (APPs -  Physician Assistants and Nurse Practitioners) who all work together to provide you with the care you need, when you need it. You will need a follow up appointment in 3- 4  weeks. You may see Sinclair Grooms, MD or one of the following Advanced Practice Providers on your designated Care Team:   Truitt Merle, NP Cecilie Kicks, NP . Kathyrn Drown, NP  Any Other Special Instructions Will Be Listed Below (If Applicable).

## 2018-10-03 ENCOUNTER — Telehealth: Payer: Self-pay

## 2018-10-03 NOTE — Telephone Encounter (Signed)
Unable for prescreen

## 2018-10-10 ENCOUNTER — Ambulatory Visit (INDEPENDENT_AMBULATORY_CARE_PROVIDER_SITE_OTHER): Payer: 59 | Admitting: *Deleted

## 2018-10-10 ENCOUNTER — Telehealth: Payer: Self-pay

## 2018-10-10 ENCOUNTER — Other Ambulatory Visit: Payer: Self-pay

## 2018-10-10 DIAGNOSIS — Z5181 Encounter for therapeutic drug level monitoring: Secondary | ICD-10-CM

## 2018-10-10 DIAGNOSIS — I059 Rheumatic mitral valve disease, unspecified: Secondary | ICD-10-CM | POA: Diagnosis not present

## 2018-10-10 DIAGNOSIS — I4892 Unspecified atrial flutter: Secondary | ICD-10-CM | POA: Diagnosis not present

## 2018-10-10 LAB — POCT INR: INR: 1.8 — AB (ref 2.0–3.0)

## 2018-10-10 NOTE — Patient Instructions (Addendum)

## 2018-10-10 NOTE — Telephone Encounter (Signed)

## 2018-10-13 ENCOUNTER — Ambulatory Visit (HOSPITAL_COMMUNITY): Payer: 59 | Attending: Cardiology

## 2018-10-13 ENCOUNTER — Other Ambulatory Visit: Payer: Self-pay

## 2018-10-13 DIAGNOSIS — I517 Cardiomegaly: Secondary | ICD-10-CM | POA: Insufficient documentation

## 2018-10-13 DIAGNOSIS — R0602 Shortness of breath: Secondary | ICD-10-CM | POA: Insufficient documentation

## 2018-10-17 ENCOUNTER — Other Ambulatory Visit: Payer: Self-pay

## 2018-10-17 ENCOUNTER — Ambulatory Visit (INDEPENDENT_AMBULATORY_CARE_PROVIDER_SITE_OTHER): Payer: 59 | Admitting: *Deleted

## 2018-10-17 DIAGNOSIS — I4892 Unspecified atrial flutter: Secondary | ICD-10-CM | POA: Diagnosis not present

## 2018-10-17 DIAGNOSIS — I059 Rheumatic mitral valve disease, unspecified: Secondary | ICD-10-CM | POA: Diagnosis not present

## 2018-10-17 DIAGNOSIS — Z5181 Encounter for therapeutic drug level monitoring: Secondary | ICD-10-CM | POA: Diagnosis not present

## 2018-10-17 LAB — POCT INR: INR: 2.9 (ref 2.0–3.0)

## 2018-10-17 NOTE — Patient Instructions (Signed)
Description   Continue taking 1 tablet daily except for 1/2 tablet on Tuesday and Thursdays. Recheck INR in 1 week. Please call [phone] if you have any procedures or medication changes.

## 2018-10-20 ENCOUNTER — Telehealth: Payer: Self-pay | Admitting: Interventional Cardiology

## 2018-10-20 DIAGNOSIS — I5032 Chronic diastolic (congestive) heart failure: Secondary | ICD-10-CM

## 2018-10-20 DIAGNOSIS — I4892 Unspecified atrial flutter: Secondary | ICD-10-CM

## 2018-10-20 NOTE — Telephone Encounter (Signed)
Please place referral in epic and I will have Nira Conn look into it. Our first available new pt appt is in mid September.

## 2018-10-20 NOTE — Telephone Encounter (Signed)
I will send a message to [name], RMA to place referral.

## 2018-10-20 NOTE — Telephone Encounter (Signed)
See Echo results from 10/13/18 as to referral to CHF.

## 2018-10-20 NOTE — Telephone Encounter (Signed)
  Patient is calling to find out when a referral will be sent to the heart failure clinic so he can get his appt set up. Patient states he was told a referral was being sent when he got his results from his echocardiogram

## 2018-10-23 ENCOUNTER — Ambulatory Visit: Payer: 59 | Admitting: Nurse Practitioner

## 2018-10-23 NOTE — Telephone Encounter (Signed)
°  The patient called this morning to follow up on his referral request. He has not heard anything from the office, and would like to set up his appointment as soon as possible.

## 2018-10-24 ENCOUNTER — Telehealth: Payer: Self-pay

## 2018-10-24 NOTE — Telephone Encounter (Signed)
Unable to lmom for prescreen constant ring

## 2018-10-25 NOTE — Progress Notes (Signed)
Cardiology Office Note   Date:  10/26/2018   ID:  Jesse, [name] 10-25-1950, MRN [id number]  PCP:  Kathyrn Lass, MD  Cardiologist:  Dr. Tamala Julian    Chief Complaint  Patient presents with  . Congestive Heart Failure      History of Present Illness: [name] is a 68 y.o. male who presents for follow up of his echo and recommendations.    09/26/18 was seen for  DOE and abnromal CTA of chest .  He notes a 10 lb wt loss since Dec.   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 Other problems include hepatitis C, chronic atrial flutter, ascending aortic aneurysm 4.5 cm, chronic kidney disease stage III,chronic anemiaand chronic anticoagulation..    Dizziness saw neuro, but declined MRI of Head at this time.   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    Emphysematous.  AND ECTATIC ASCENDING AORTA AT 4.4 CM   On Echo 07/23/17 Pulmonary artery was normal range with pressure and size.  Tr Pl effusions  Mri ANGIO 2018 Re- demonstration of ascending aortic aneurysm, with diameter on today's study measuring approximately 4.2 cm which is essentially unchanged from the most remote CT study of 2005 when the measurement (measured contemporaneously today) was approximately 4.2 cm.  Re- demonstration of main pulmonary artery enlargement measuring 4.0 cm today, and 3.2 cm in 2005, enlarged over time. This may reflect developing pulmonary hypertension.  He has been DOE only none with exertion.  Recent BNP was elevated. He has lost 10 lbs as well.  He has a good BK but rest of day no particular appetite.   Prior US abd with dx of cirrhosis.  No chest pain.  Recent labs as below.   I discussed with Dr. Tamala Julian and Dr. Cyndia Bent, echo recommended.    Echo with EF 60-65%, moderately  increased LV wall thickness.  Mod LVH RV with severely reducedsystolic function pressure moderately elevated at 54.9 mmHg mildly dilated aortic root 4.4 cm, mild AI s/p MVR , severe LAE, severe RV dysfunction mild TR estimated RVSP moderately elevated.   Dr. Tamala Julian and Dr. Cyndia Bent recommended advanced heart failure team appt  Today pt is frustrated that he has not had anymore info about HF appt. I did discuss with him.  Today he continues wt loss.  He has been afraid to take ensure due to Vit K,  I explained I had talked with  Pharm D and if consistent it would be ok to take.    His wt today is down 12 lbs.  His SOB is stable. Sleeps on 1 pillow.  He has no appetite.  again discussed ensure.  No chest pain.  He still works and is mostly sitting or standing.    Taking extra lasix has helped.  His BP is stable today but runs on lower end 109/68 and 110/52.  He has ckd 3  Average Cr 1.93 to 1.88, with acute HF was 2.16.     Past Medical History:  Diagnosis Date  . Allergy   . Aneurysm of ascending aorta (HCC)   . Atrial fibrillation ([place])   . BPH (benign prostatic hyperplasia)   . Colon polyps   . FHx: rheumatic heart disease   . H/O diplopia   . Hepatitis C   .  Hypertension   . Hypogonadism male   . Mitral valve disease     Past Surgical History:  Procedure Laterality Date  . COLON SURGERY    . COLONOSCOPY    . COLONOSCOPY N/A 11/14/2012   Procedure: COLONOSCOPY;  Surgeon: Lear Ng, MD;  Location: [facility];  Service: Endoscopy;  Laterality: N/A;  . ESOPHAGOGASTRODUODENOSCOPY (EGD) WITH PROPOFOL N/A 05/08/2014   Procedure: ESOPHAGOGASTRODUODENOSCOPY (EGD) WITH PROPOFOL;  Surgeon: Lear Ng, MD;  Location: [facility];  Service: Endoscopy;  Laterality: N/A;  . MITRAL VALVE REPLACEMENT       Current Outpatient Medications  Medication Sig Dispense Refill  . amoxicillin (AMOXIL) 500 MG capsule TAKE FOUR CAPSULES BY MOUTH 1 HOUR BEFORE DENTAL APPOINTMENT 4 capsule 0   . furosemide (LASIX) 20 MG tablet Take 20 mg by mouth daily.     . IRON PO Take 1 tablet by mouth every other day.    . Multiple Vitamin (MULTIVITAMIN WITH MINERALS) TABS tablet Take 1 tablet by mouth daily.    . ondansetron (ZOFRAN-ODT) 4 MG disintegrating tablet Take 1 tablet (4 mg total) by mouth every 8 (eight) hours as needed for nausea. 30 tablet 3  . sildenafil (VIAGRA) 100 MG tablet Take 100 mg by mouth as needed.    . testosterone cypionate (DEPOTESTOSTERONE CYPIONATE) 200 MG/ML injection Inject 200 mg into the muscle every Thursday.     . warfarin (COUMADIN) 5 MG tablet Take 5 mg by mouth See admin instructions. Take 5 mg every day except Friday take 7.5 mg    . zolpidem (AMBIEN) 5 MG tablet Take 1-2 tablets by mouth at bedtime as needed.     No current facility-administered medications for this visit.     Allergies:   Penicillins    Social History:  The patient  reports that he quit smoking about 13 months ago. His smoking use included cigars. He has never used smokeless tobacco. He reports that he does not drink alcohol or use drugs.   Family History:  The patient's Family history is unknown by patient.    ROS:  General:no colds or fevers, + continued wt loss of 12 lbs Skin:no rashes or ulcers HEENT:no blurred vision, no congestion CV:see HPI PUL:see HPI GI:no diarrhea constipation or melena, no indigestion, chronic hep C cirrhosis  GU:no hematuria, no dysuria MS:no joint pain, no claudication Neuro:no syncope, no lightheadedness Endo:no diabetes, no thyroid disease  Wt Readings from Last 3 Encounters:  10/26/18 136 lb (61.7 kg)  09/26/18 139 lb (63 kg)  06/07/18 149 lb (67.6 kg)     PHYSICAL EXAM: VS:  BP (!) 122/58   Pulse 76   Ht 6\' 1"  (1.854 m)   Wt 136 lb (61.7 kg)   SpO2 100%   BMI 17.94 kg/m  , BMI Body mass index is 17.94 kg/m. General:Pleasant affect, NAD Skin:Warm and dry, brisk capillary refill HEENT:normocephalic, sclera clear, mucus membranes  moist Neck:supple, mild JVD, no bruits  Heart: irreg irreg with 2/6 murmur, and tricuspid murmur no gallup, rub or click Lungs:clear without rales, rhonchi, or wheezes UVO:ZDGU, non tender, + BS, do not palpate liver spleen or masses Ext:1+ lower ext edema L > R, 2+ pedal pulses, 2+ radial pulses Neuro:alert and oriented X 3, MAE, follows commands, + facial symmetry    EKG:  EKG is NOT ordered today.   Recent Labs: 03/04/2018: ALT 48; B Natriuretic Peptide 523.2; BUN 20; Creatinine, Ser 2.16; Hemoglobin 10.5; Platelets 92; Potassium 3.6; Sodium 139  Lipid Panel No results found for: CHOL, TRIG, HDL, CHOLHDL, VLDL, LDLCALC, LDLDIRECT     Other studies Reviewed: Additional studies/ records that were reviewed today include: . Echo 10/13/18 IMPRESSIONS    1. The left ventricle has normal systolic function with an ejection fraction of 60-65%. The cavity size was normal. There is moderately increased left ventricular wall thickness. Left ventricular diastolic function could not be evaluated secondary to  atrial fibrillation.  2. The right ventricle has severely reduced systolic function. The cavity was normal. Right ventricular systolic pressure is moderately elevated with an estimated pressure of 54.9 mmHg.  3. Left atrial size was severely dilated.  4. The tricuspid valve is grossly normal.  5. The aortic valve is tricuspid. Mild thickening of the aortic valve. Aortic valve regurgitation is mild by color flow Doppler. No stenosis of the aortic valve.  6. There is mild dilatation of the aortic root and of the ascending aorta measuring 44 mm.  7. Normal LV systolic function; moderate LVH; mildly dilated aortic root (4.4 cm); mild AI; s/p MVR with mean gradient of 5 mmHg; severe LAE; severe RV dysfunction; mild TR; estimated RVSP moderately elevated.  FINDINGS  Left Ventricle: The left ventricle has normal systolic function, with an ejection fraction of 60-65%. The cavity size was  normal. There is moderately increased left ventricular wall thickness. Left ventricular diastolic function could not be evaluated  secondary to atrial fibrillation.  Right Ventricle: The right ventricle has severely reduced systolic function. The cavity was normal. Right ventricular systolic pressure is moderately elevated with an estimated pressure of 54.9 mmHg.  Left Atrium: Left atrial size was severely dilated.  Right Atrium: Right atrial size was normal in size. Right atrial pressure is estimated at 10 mmHg.  Interatrial Septum: No atrial level shunt detected by color flow Doppler.  Pericardium: There is no evidence of pericardial effusion.  Mitral Valve: The mitral valve has been repaired/replaced. Mitral valve regurgitation is not visualized by color flow Doppler.  Tricuspid Valve: The tricuspid valve is grossly normal. Tricuspid valve regurgitation is mild by color flow Doppler.  Aortic Valve: The aortic valve is tricuspid Mild thickening of the aortic valve. Aortic valve regurgitation is mild by color flow Doppler. There is No stenosis of the aortic valve.  Pulmonic Valve: The pulmonic valve was grossly normal. Pulmonic valve regurgitation is trivial by color flow Doppler.  Aorta: There is mild dilatation of the aortic root and of the ascending aorta measuring 44 mm.  Venous: The inferior vena cava measures 2.00 cm, is normal in size with greater than 50% respiratory variability.  Additional Comments: Normal LV systolic function; moderate LVH; mildly dilated aortic root (4.4 cm); mild AI; s/p MVR with mean gradient of 5 mmHg; severe LAE; severe RV dysfunction; mild TR; estimated RVSP moderately elevated.     ASSESSMENT AND PLAN:  1.  Chronic Rt sided HF - with mild volume overload now on lasix 20 mg daily on twice a week 40 mg daily per pt.  He severe RV failure.   On no meds other than lasix due to borderline BP at times, and CKD-3.  Today BP is stable.   Discussed with DOD concerning meds, will defer to Dr. Tamala Julian or advanced HF team.   Both Dr. Tamala Julian and Dr. Cyndia Bent have requested advanced HF team eval.  I have been unable to get an appt for the pt.  Have message to Dr. Haroldine Laws.  2.  cachexia  12 lb wt loss since march.  Unsure if this is all heart related vs role of hepatic fibrosis possible cirrhosis, hx of hep C genotype 1 B cured. Per GI.  Had been followed at [facility].   3. 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  On coumadin and INR checked here. checked today.   4.  Chronic a flutter rate controlled   5.  Thoracic aneurysm followed by Dr. Cyndia Bent    Current medicines are reviewed with the patient today.  The patient Has no concerns regarding medicines.  The following changes have been made:  See above Labs/ tests ordered today include:see above  Disposition:   FU:  see above  Signed, Cecilie Kicks, NP  10/26/2018 9:35 AM    [facility] [address], [place] [place], [place] Phone: [phone]; Fax: [phone]

## 2018-10-26 ENCOUNTER — Ambulatory Visit (INDEPENDENT_AMBULATORY_CARE_PROVIDER_SITE_OTHER): Payer: 59 | Admitting: Cardiology

## 2018-10-26 ENCOUNTER — Encounter: Payer: Self-pay | Admitting: Cardiology

## 2018-10-26 ENCOUNTER — Ambulatory Visit (INDEPENDENT_AMBULATORY_CARE_PROVIDER_SITE_OTHER): Payer: 59 | Admitting: *Deleted

## 2018-10-26 ENCOUNTER — Other Ambulatory Visit: Payer: Self-pay

## 2018-10-26 ENCOUNTER — Telehealth: Payer: 59 | Admitting: Cardiology

## 2018-10-26 VITALS — BP 122/58 | HR 76 | Ht 73.0 in | Wt 136.0 lb

## 2018-10-26 DIAGNOSIS — I4892 Unspecified atrial flutter: Secondary | ICD-10-CM

## 2018-10-26 DIAGNOSIS — I059 Rheumatic mitral valve disease, unspecified: Secondary | ICD-10-CM | POA: Diagnosis not present

## 2018-10-26 DIAGNOSIS — Z5181 Encounter for therapeutic drug level monitoring: Secondary | ICD-10-CM

## 2018-10-26 DIAGNOSIS — I5032 Chronic diastolic (congestive) heart failure: Secondary | ICD-10-CM

## 2018-10-26 DIAGNOSIS — Z7901 Long term (current) use of anticoagulants: Secondary | ICD-10-CM | POA: Diagnosis not present

## 2018-10-26 DIAGNOSIS — R64 Cachexia: Secondary | ICD-10-CM

## 2018-10-26 LAB — POCT INR: INR: 2 (ref 2.0–3.0)

## 2018-10-26 NOTE — Patient Instructions (Signed)
Medication Instructions:  Your physician recommends that you continue on your current medications as directed. Please refer to the Current Medication list given to you today.  If you need a refill on your cardiac medications before your next appointment, please call your pharmacy.   Lab work: NONE If you have labs (blood work) drawn today and your tests are completely normal, you will receive your results only by: Marland Kitchen MyChart Message (if you have MyChart) OR . A paper copy in the mail If you have any lab test that is abnormal or we need to change your treatment, we will call you to review the results.  Testing/Procedures: NONE  Follow-Up: At [facility], you and your health needs are our priority.  As part of our continuing mission to provide you with exceptional heart care, we have created designated Provider Care Teams.  These Care Teams include your primary Cardiologist (physician) and Advanced Practice Providers (APPs -  Physician Assistants and Nurse Practitioners) who all work together to provide you with the care you need, when you need it. You will need a follow up appointment in 1 months  Sinclair Grooms, MD ON 11/22/18 AT 3:40 PM.  Any Other Special Instructions Will Be Listed Below (If Applicable).

## 2018-10-26 NOTE — Patient Instructions (Addendum)
Description    Take 1 tablet today, then change dose to 1 tablet daily except for 1/2 tablet on Thursdays. Recheck INR in 1 week. Pt stated he is going to start taking ensure (2 ensures per day).Please call [phone] if you have any procedures or medication changes.

## 2018-11-01 ENCOUNTER — Telehealth: Payer: Self-pay | Admitting: Pharmacist

## 2018-11-01 NOTE — Telephone Encounter (Signed)

## 2018-11-03 ENCOUNTER — Ambulatory Visit (INDEPENDENT_AMBULATORY_CARE_PROVIDER_SITE_OTHER): Payer: 59 | Admitting: Pharmacist

## 2018-11-03 ENCOUNTER — Other Ambulatory Visit: Payer: Self-pay

## 2018-11-03 DIAGNOSIS — I4892 Unspecified atrial flutter: Secondary | ICD-10-CM

## 2018-11-03 DIAGNOSIS — Z5181 Encounter for therapeutic drug level monitoring: Secondary | ICD-10-CM

## 2018-11-03 DIAGNOSIS — I059 Rheumatic mitral valve disease, unspecified: Secondary | ICD-10-CM

## 2018-11-03 LAB — POCT INR: INR: 2.7 (ref 2.0–3.0)

## 2018-11-03 NOTE — Patient Instructions (Signed)
Description   Continue taking 1 tablet daily except for 1/2 tablet on Thursdays. Recheck INR in 3 weeks. Continue drinking 2 Ensures each day.Please call [phone] if you have any procedures or medication changes.

## 2018-11-07 ENCOUNTER — Inpatient Hospital Stay (HOSPITAL_COMMUNITY): Admission: RE | Admit: 2018-11-07 | Payer: 59 | Source: Ambulatory Visit | Admitting: Internal Medicine

## 2018-11-08 ENCOUNTER — Encounter (HOSPITAL_COMMUNITY): Payer: Self-pay | Admitting: Internal Medicine

## 2018-11-08 ENCOUNTER — Encounter (HOSPITAL_COMMUNITY): Payer: Self-pay | Admitting: *Deleted

## 2018-11-08 ENCOUNTER — Other Ambulatory Visit: Payer: Self-pay

## 2018-11-08 ENCOUNTER — Ambulatory Visit (HOSPITAL_COMMUNITY)
Admission: RE | Admit: 2018-11-08 | Discharge: 2018-11-08 | Disposition: A | Discharge status: Home / Self Care | Payer: 59 | Source: Ambulatory Visit | Attending: Internal Medicine | Admitting: Internal Medicine

## 2018-11-08 ENCOUNTER — Other Ambulatory Visit (HOSPITAL_COMMUNITY): Payer: Self-pay | Admitting: *Deleted

## 2018-11-08 VITALS — BP 148/82 | HR 85 | Ht 73.0 in | Wt 144.8 lb

## 2018-11-08 DIAGNOSIS — I059 Rheumatic mitral valve disease, unspecified: Secondary | ICD-10-CM | POA: Diagnosis not present

## 2018-11-08 DIAGNOSIS — I129 Hypertensive chronic kidney disease with stage 1 through stage 4 chronic kidney disease, or unspecified chronic kidney disease: Secondary | ICD-10-CM | POA: Insufficient documentation

## 2018-11-08 DIAGNOSIS — Z79899 Other long term (current) drug therapy: Secondary | ICD-10-CM | POA: Insufficient documentation

## 2018-11-08 DIAGNOSIS — I4891 Unspecified atrial fibrillation: Secondary | ICD-10-CM | POA: Diagnosis not present

## 2018-11-08 DIAGNOSIS — I2781 Cor pulmonale (chronic): Secondary | ICD-10-CM | POA: Diagnosis not present

## 2018-11-08 DIAGNOSIS — I272 Pulmonary hypertension, unspecified: Secondary | ICD-10-CM | POA: Insufficient documentation

## 2018-11-08 DIAGNOSIS — I4892 Unspecified atrial flutter: Secondary | ICD-10-CM | POA: Diagnosis not present

## 2018-11-08 DIAGNOSIS — I5032 Chronic diastolic (congestive) heart failure: Secondary | ICD-10-CM | POA: Diagnosis not present

## 2018-11-08 DIAGNOSIS — I712 Thoracic aortic aneurysm, without rupture: Secondary | ICD-10-CM | POA: Insufficient documentation

## 2018-11-08 DIAGNOSIS — I2721 Secondary pulmonary arterial hypertension: Secondary | ICD-10-CM

## 2018-11-08 DIAGNOSIS — Z7901 Long term (current) use of anticoagulants: Secondary | ICD-10-CM | POA: Insufficient documentation

## 2018-11-08 DIAGNOSIS — Z8619 Personal history of other infectious and parasitic diseases: Secondary | ICD-10-CM | POA: Insufficient documentation

## 2018-11-08 DIAGNOSIS — Z880 Allergy status to penicillin: Secondary | ICD-10-CM | POA: Diagnosis not present

## 2018-11-08 DIAGNOSIS — Z8719 Personal history of other diseases of the digestive system: Secondary | ICD-10-CM | POA: Diagnosis not present

## 2018-11-08 DIAGNOSIS — N183 Chronic kidney disease, stage 3 (moderate): Secondary | ICD-10-CM | POA: Diagnosis not present

## 2018-11-08 DIAGNOSIS — Z87891 Personal history of nicotine dependence: Secondary | ICD-10-CM | POA: Diagnosis not present

## 2018-11-08 DIAGNOSIS — Z952 Presence of prosthetic heart valve: Secondary | ICD-10-CM | POA: Insufficient documentation

## 2018-11-08 MED ORDER — SODIUM CHLORIDE 0.9% FLUSH: 3.0000 mL | Freq: Two times a day (BID) | INTRAVENOUS | Status: DC

## 2018-11-08 NOTE — Addendum Note (Signed)
Encounter addended by: Shonna Chock, CMA on: 0/05/6376 4:03 PM  Actions taken: Order list changed

## 2018-11-08 NOTE — Addendum Note (Signed)
Encounter addended by: Scarlette Calico, RN on: 11/08/2018 4:04 PM  Actions taken: Clinical Note Signed

## 2018-11-08 NOTE — Progress Notes (Signed)
ADVANCED HF CLINIC CONSULT NOTE  Referring Physician: Primary Care: Primary Cardiologist:  HPI:  [name] is a 68 y.o. male who is referred by Dr. 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 Other problems include hepatitis C (s/p curative therapy), chronic atrial flutter, ascending aortic aneurysm 4.5 cm, chronic kidney disease stage III,chronic anemic. Marland Kitchen    Recent CTA of chest neg for PE but dilated main pulmonary artery at 4.5 cm. + emphysema. Ectatic ascending aorta 4.4 cm.   Echo 7/20 EF 60-65%, moderately increased LV wall thickness.  Mod LVH RV with severely reducedsystolic function pressure moderately elevated at 54.9 mmHg mildly dilated aortic root 4.4 cm, mild AI s/p MVR , severe LAE, severe RV dysfunction mild TR estimated RVSP moderately elevated.    About 3 months ago started feeling worse. More SOB, more LE edema. Says on a good day he can walk a block. Can go to grocery but goes slowly. Appetite up and down. Has lost ~ 10 pounds over the last 3 months. 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 Takes lasix 20mg  5 days per week and other 2 days take 40. Can go up as needed when he eats extra sodium. No syncope. Gets dizzy occasionally when walking up steps.   Smoked < 1/2 ppd x many years. Quit 1-2 years ago  Review of Systems: [y] = yes, [ ]  = no   General: Weight gain [ ] ; Weight loss Blue.Reese ]; Anorexia [ ] ; Fatigue [ y]; Fever [ ] ; Chills [ ] ; Weakness [y]  Cardiac: Chest pain/pressure [ ] ; Resting SOB [ ] ; Exertional SOB Blue.Reese ]; Orthopnea [ ] ; Pedal Edema Blue.Reese ]; Palpitations [ ] ; Syncope [ ] ; Presyncope [ y]; Paroxysmal nocturnal dyspnea[ ]   Pulmonary: Cough [ ] ; Wheezing[ ] ; Hemoptysis[ ] ; Sputum [ ] ; Snoring [ ]   GI: Vomiting[  ]; Dysphagia[ ] ; Melena[ ] ; Hematochezia [ ] ; Heartburn[ ] ; Abdominal pain [ ] ; Constipation [ ] ; Diarrhea [ ] ; BRBPR [ ]   GU: Hematuria[ ] ; Dysuria [ ] ; Nocturia[ ]   Vascular: Pain in legs with walking [ ] ; Pain in feet with lying flat [ ] ; Non-healing sores [ ] ; Stroke [ ] ; TIA [ ] ; Slurred speech [ ] ;  Neuro: Headaches[ ] ; Vertigo[ ] ; Seizures[ ] ; Paresthesias[ ] ;Blurred vision [ ] ; Diplopia [ ] ; Vision changes [ ]   Ortho/Skin: Arthritis Blue.Reese ]; Joint pain Blue.Reese ]; Muscle pain [ ] ; Joint swelling [ ] ; Back Pain [ ] ; Rash [ ]   Psych: Depression[ ] ; Anxiety[ ]   Heme: Bleeding problems [ ] ; Clotting disorders [ ] ; Anemia [ ]   Endocrine: Diabetes [ ] ; Thyroid dysfunction[ ]    Past Medical History:  Diagnosis Date   Allergy    Aneurysm of ascending aorta (HCC)    Atrial fibrillation (HCC)    BPH (benign prostatic hyperplasia)    Colon polyps    FHx: rheumatic heart disease    H/O diplopia    Hepatitis C    Hypertension    Hypogonadism male    Mitral valve disease     Current Outpatient Medications  Medication Sig Dispense Refill   amoxicillin (AMOXIL) 500 MG capsule TAKE FOUR CAPSULES BY MOUTH 1 HOUR BEFORE DENTAL APPOINTMENT 4 capsule 0   furosemide (LASIX) 20 MG tablet Take  20 mg by mouth daily.      IRON PO Take 1 tablet by mouth every other day.     Multiple Vitamin (MULTIVITAMIN WITH MINERALS) TABS tablet Take 1 tablet by mouth daily.     ondansetron (ZOFRAN-ODT) 4 MG disintegrating tablet Take 1 tablet (4 mg total) by mouth every 8 (eight) hours as needed for nausea. 30 tablet 3   sildenafil (VIAGRA) 100 MG tablet Take 100 mg by mouth as needed.     testosterone cypionate (DEPOTESTOSTERONE CYPIONATE) 200 MG/ML injection Inject 200 mg into the muscle every Thursday.      warfarin (COUMADIN) 5 MG tablet Take 5 mg by mouth See admin instructions. Take 5 mg every day except Friday take 7.5 mg     zolpidem (AMBIEN) 5 MG tablet Take 1-2 tablets by mouth at bedtime as  needed.     No current facility-administered medications for this encounter.     Allergies  Allergen Reactions   Penicillins Rash    Has patient had a PCN reaction causing immediate rash, facial/tongue/throat swelling, SOB or lightheadedness with hypotension: Yes Has patient had a PCN reaction causing severe rash involving mucus membranes or skin necrosis: No Has patient had a PCN reaction that required hospitalization: No Has patient had a PCN reaction occurring within the last 10 years: No If all of the above answers are "NO", then may proceed with Cephalosporin use.       Social History   Socioeconomic History   Marital status: Divorced    Spouse name: Not on file   Number of children: 2   Years of education: Not on file   Highest education level: Some college, no degree  Occupational History   Not on file  Social Needs   Financial resource strain: Not on file   Food insecurity    Worry: Not on file    Inability: Not on file   Transportation needs    Medical: Not on file    Non-medical: Not on file  Tobacco Use   Smoking status: Former Smoker    Types: Cigars    Quit date: 09/03/2017    Years since quitting: 1.1   Smokeless tobacco: Never Used  Substance and Sexual Activity   Alcohol use: No   Drug use: No   Sexual activity: Not on file  Lifestyle   Physical activity    Days per week: Not on file    Minutes per session: Not on file   Stress: Not on file  Relationships   Social connections    Talks on phone: Not on file    Gets together: Not on file    Attends religious service: Not on file    Active member of club or organization: Not on file    Attends meetings of clubs or organizations: Not on file    Relationship status: Not on file   Intimate partner violence    Fear of current or ex partner: Not on file    Emotionally abused: Not on file    Physically abused: Not on file    Forced sexual activity: Not on file  Other Topics Concern    Not on file  Social History Narrative   Lives at home alone    Left handed   Caffeine: 1-2 cups daily   Fhx: No FHX of premature CAD or SCD. Personally reviewed   Vitals:   11/08/18 1457  BP: (!) 148/82  Pulse: 85  SpO2: 95%  Weight: 65.7 kg (  144 lb 12.8 oz)  Height: 6\' 1"  (1.854 m)    PHYSICAL EXAM: General:  Thin No respiratory difficulty HEENT: normal x mild scleral icterus Neck: supple. JVP 9-10 with prominent v waves Carotids 2+ bilat; no bruits. No lymphadenopathy or thryomegaly appreciated. Cor: PMI nondisplaced. + RV lift  Irregular rate & rhythm. 2/6 TR mechanical s1 crisp Lungs: clear with decreased BS Abdomen: soft, nontender, nondistended. Liver edge down 4-5 cm No bruits or masses. Good bowel sounds. Extremities: no cyanosis, clubbing, rash, 2+ edema Neuro: alert & oriented x 3, cranial nerves grossly intact. moves all 4 extremities w/o difficulty. Affect pleasant.    ASSESSMENT & PLAN:  1.  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 LVEF normal. RV severely dilated and HK. RVSP 55 - NYHA III-IIIb - Options likely very limited. Will proceed with RHC to see if he is candidate for pulmonary artery vasodialtors or if he will need palliative milrinone.  - Home inotropes problematic as I prefer not to have indwelling PICC with mechanical MV - Volume status mildly elevated. Continue sliding scale diuretics. Given degree of RV dysfunction likely will not be able to get all edema off    2. 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   - on coumadin  3.  Chronic a flutter  - rate controlled on coumadin  4.  Thoracic aneurysm followed by Dr. Cyndia Bent  - not surgical candidate  Total time spent 45 minutes. Over half that time spent discussing above.   Glori Bickers, MD  3:59 PM

## 2018-11-08 NOTE — H&P (View-Only) (Signed)
ADVANCED HF CLINIC CONSULT NOTE  Referring Physician: Primary Care: Primary Cardiologist:  HPI:  [name] is a 68 y.o. male who is referred by Dr. 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 Other problems include hepatitis C (s/p curative therapy), chronic atrial flutter, ascending aortic aneurysm 4.5 cm, chronic kidney disease stage III,chronic anemic. Marland Kitchen    Recent CTA of chest neg for PE but dilated main pulmonary artery at 4.5 cm. + emphysema. Ectatic ascending aorta 4.4 cm.   Echo 7/20 EF 60-65%, moderately increased LV wall thickness.  Mod LVH RV with severely reducedsystolic function pressure moderately elevated at 54.9 mmHg mildly dilated aortic root 4.4 cm, mild AI s/p MVR , severe LAE, severe RV dysfunction mild TR estimated RVSP moderately elevated.    About 3 months ago started feeling worse. More SOB, more LE edema. Says on a good day he can walk a block. Can go to grocery but goes slowly. Appetite up and down. Has lost ~ 10 pounds over the last 3 months. 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 Takes lasix 20mg  5 days per week and other 2 days take 40. Can go up as needed when he eats extra sodium. No syncope. Gets dizzy occasionally when walking up steps.   Smoked < 1/2 ppd x many years. Quit 1-2 years ago  Review of Systems: [y] = yes, [ ]  = no   General: Weight gain [ ] ; Weight loss Blue.Reese ]; Anorexia [ ] ; Fatigue [ y]; Fever [ ] ; Chills [ ] ; Weakness [y]  Cardiac: Chest pain/pressure [ ] ; Resting SOB [ ] ; Exertional SOB Blue.Reese ]; Orthopnea [ ] ; Pedal Edema Blue.Reese ]; Palpitations [ ] ; Syncope [ ] ; Presyncope [ y]; Paroxysmal nocturnal dyspnea[ ]   Pulmonary: Cough [ ] ; Wheezing[ ] ; Hemoptysis[ ] ; Sputum [ ] ; Snoring [ ]   GI: Vomiting[  ]; Dysphagia[ ] ; Melena[ ] ; Hematochezia [ ] ; Heartburn[ ] ; Abdominal pain [ ] ; Constipation [ ] ; Diarrhea [ ] ; BRBPR [ ]   GU: Hematuria[ ] ; Dysuria [ ] ; Nocturia[ ]   Vascular: Pain in legs with walking [ ] ; Pain in feet with lying flat [ ] ; Non-healing sores [ ] ; Stroke [ ] ; TIA [ ] ; Slurred speech [ ] ;  Neuro: Headaches[ ] ; Vertigo[ ] ; Seizures[ ] ; Paresthesias[ ] ;Blurred vision [ ] ; Diplopia [ ] ; Vision changes [ ]   Ortho/Skin: Arthritis Blue.Reese ]; Joint pain Blue.Reese ]; Muscle pain [ ] ; Joint swelling [ ] ; Back Pain [ ] ; Rash [ ]   Psych: Depression[ ] ; Anxiety[ ]   Heme: Bleeding problems [ ] ; Clotting disorders [ ] ; Anemia [ ]   Endocrine: Diabetes [ ] ; Thyroid dysfunction[ ]    Past Medical History:  Diagnosis Date   Allergy    Aneurysm of ascending aorta (HCC)    Atrial fibrillation (HCC)    BPH (benign prostatic hyperplasia)    Colon polyps    FHx: rheumatic heart disease    H/O diplopia    Hepatitis C    Hypertension    Hypogonadism male    Mitral valve disease     Current Outpatient Medications  Medication Sig Dispense Refill   amoxicillin (AMOXIL) 500 MG capsule TAKE FOUR CAPSULES BY MOUTH 1 HOUR BEFORE DENTAL APPOINTMENT 4 capsule 0   furosemide (LASIX) 20 MG tablet Take  20 mg by mouth daily.      IRON PO Take 1 tablet by mouth every other day.     Multiple Vitamin (MULTIVITAMIN WITH MINERALS) TABS tablet Take 1 tablet by mouth daily.     ondansetron (ZOFRAN-ODT) 4 MG disintegrating tablet Take 1 tablet (4 mg total) by mouth every 8 (eight) hours as needed for nausea. 30 tablet 3   sildenafil (VIAGRA) 100 MG tablet Take 100 mg by mouth as needed.     testosterone cypionate (DEPOTESTOSTERONE CYPIONATE) 200 MG/ML injection Inject 200 mg into the muscle every Thursday.      warfarin (COUMADIN) 5 MG tablet Take 5 mg by mouth See admin instructions. Take 5 mg every day except Friday take 7.5 mg     zolpidem (AMBIEN) 5 MG tablet Take 1-2 tablets by mouth at bedtime as  needed.     No current facility-administered medications for this encounter.     Allergies  Allergen Reactions   Penicillins Rash    Has patient had a PCN reaction causing immediate rash, facial/tongue/throat swelling, SOB or lightheadedness with hypotension: Yes Has patient had a PCN reaction causing severe rash involving mucus membranes or skin necrosis: No Has patient had a PCN reaction that required hospitalization: No Has patient had a PCN reaction occurring within the last 10 years: No If all of the above answers are "NO", then may proceed with Cephalosporin use.       Social History   Socioeconomic History   Marital status: Divorced    Spouse name: Not on file   Number of children: 2   Years of education: Not on file   Highest education level: Some college, no degree  Occupational History   Not on file  Social Needs   Financial resource strain: Not on file   Food insecurity    Worry: Not on file    Inability: Not on file   Transportation needs    Medical: Not on file    Non-medical: Not on file  Tobacco Use   Smoking status: Former Smoker    Types: Cigars    Quit date: 09/03/2017    Years since quitting: 1.1   Smokeless tobacco: Never Used  Substance and Sexual Activity   Alcohol use: No   Drug use: No   Sexual activity: Not on file  Lifestyle   Physical activity    Days per week: Not on file    Minutes per session: Not on file   Stress: Not on file  Relationships   Social connections    Talks on phone: Not on file    Gets together: Not on file    Attends religious service: Not on file    Active member of club or organization: Not on file    Attends meetings of clubs or organizations: Not on file    Relationship status: Not on file   Intimate partner violence    Fear of current or ex partner: Not on file    Emotionally abused: Not on file    Physically abused: Not on file    Forced sexual activity: Not on file  Other Topics Concern    Not on file  Social History Narrative   Lives at home alone    Left handed   Caffeine: 1-2 cups daily   Fhx: No FHX of premature CAD or SCD. Personally reviewed   Vitals:   11/08/18 1457  BP: (!) 148/82  Pulse: 85  SpO2: 95%  Weight: 65.7 kg (  144 lb 12.8 oz)  Height: 6\' 1"  (1.854 m)    PHYSICAL EXAM: General:  Thin No respiratory difficulty HEENT: normal x mild scleral icterus Neck: supple. JVP 9-10 with prominent v waves Carotids 2+ bilat; no bruits. No lymphadenopathy or thryomegaly appreciated. Cor: PMI nondisplaced. + RV lift  Irregular rate & rhythm. 2/6 TR mechanical s1 crisp Lungs: clear with decreased BS Abdomen: soft, nontender, nondistended. Liver edge down 4-5 cm No bruits or masses. Good bowel sounds. Extremities: no cyanosis, clubbing, rash, 2+ edema Neuro: alert & oriented x 3, cranial nerves grossly intact. moves all 4 extremities w/o difficulty. Affect pleasant.    ASSESSMENT & PLAN:  1.  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 LVEF normal. RV severely dilated and HK. RVSP 55 - NYHA III-IIIb - Options likely very limited. Will proceed with RHC to see if he is candidate for pulmonary artery vasodialtors or if he will need palliative milrinone.  - Home inotropes problematic as I prefer not to have indwelling PICC with mechanical MV - Volume status mildly elevated. Continue sliding scale diuretics. Given degree of RV dysfunction likely will not be able to get all edema off    2. 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   - on coumadin  3.  Chronic a flutter  - rate controlled on coumadin  4.  Thoracic aneurysm followed by Dr. Cyndia Bent  - not surgical candidate  Total time spent 45 minutes. Over half that time spent discussing above.   Glori Bickers, MD  3:59 PM

## 2018-11-08 NOTE — Patient Instructions (Signed)
Heart Catheterization on Friday August 21st, see instruction sheet  Your physician recommends that you schedule a follow-up appointment in: 6-8 weeks  At the [facility], you and your health needs are our priority. As part of our continuing mission to provide you with exceptional heart care, we have created designated Provider Care Teams. These Care Teams include your primary Cardiologist (physician) and Advanced Practice Providers (APPs- Physician Assistants and Nurse Practitioners) who all work together to provide you with the care you need, when you need it.   You may see any of the following providers on your designated Care Team at your next follow up: Marland Kitchen Dr Glori Bickers . Dr Loralie Champagne . Darrick Grinder, NP   Please be sure to bring in all your medications bottles to every appointment.

## 2018-11-21 ENCOUNTER — Other Ambulatory Visit (HOSPITAL_COMMUNITY)
Admission: RE | Admit: 2018-11-21 | Discharge: 2018-11-21 | Disposition: A | Discharge status: Home / Self Care | Payer: 59 | Source: Ambulatory Visit | Attending: Internal Medicine | Admitting: Internal Medicine

## 2018-11-21 DIAGNOSIS — Z20828 Contact with and (suspected) exposure to other viral communicable diseases: Secondary | ICD-10-CM | POA: Insufficient documentation

## 2018-11-21 DIAGNOSIS — Z01812 Encounter for preprocedural laboratory examination: Secondary | ICD-10-CM | POA: Diagnosis not present

## 2018-11-21 LAB — SARS CORONAVIRUS 2 (TAT 6-24 HRS): SARS Coronavirus 2: NEGATIVE

## 2018-11-22 ENCOUNTER — Ambulatory Visit: Payer: 59 | Admitting: Interventional Cardiology

## 2018-11-22 NOTE — Progress Notes (Deleted)
Cardiology Office Note:    Date:  11/22/2018   ID:  ARSAL TAPPAN, DOB 1950-12-30, MRN [id number]  PCP:  Kathyrn Lass, MD  Cardiologist:  Sinclair Grooms, MD   Referring MD: Kathyrn Lass, MD   No chief complaint on file.   History of Present Illness:    [name] is a 68 y.o. 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 Other problems include hepatitis C (s/p curative therapy), chronic atrial flutter, ascending aortic aneurysm 4.5 cm, chronic kidney disease stage III,chronic anemia.  Has severe pulmonary hypertension, dilated pulmonary artery, has been seen in the advanced heart failure clinic, and is pending right heart cath to determine if therapies are available.   ***. Marland Kitchen   Past Medical History:  Diagnosis Date  . Allergy   . Aneurysm of ascending aorta (HCC)   . Atrial fibrillation ([facility])   . BPH (benign prostatic hyperplasia)   . Colon polyps   . FHx: rheumatic heart disease   . H/O diplopia   . Hepatitis C   . Hypertension   . Hypogonadism male   . Mitral valve disease     Past Surgical History:  Procedure Laterality Date  . COLON SURGERY    . COLONOSCOPY    . COLONOSCOPY N/A 11/14/2012   Procedure: COLONOSCOPY;  Surgeon: Lear Ng, MD;  Location: [facility];  Service: Endoscopy;  Laterality: N/A;  . ESOPHAGOGASTRODUODENOSCOPY (EGD) WITH PROPOFOL N/A 05/08/2014   Procedure: ESOPHAGOGASTRODUODENOSCOPY (EGD) WITH PROPOFOL;  Surgeon: Lear Ng, MD;  Location: [facility];  Service: Endoscopy;  Laterality: N/A;  . MITRAL VALVE REPLACEMENT      Current Medications: No outpatient medications have been marked as taking for the 11/22/18 encounter (Appointment) with Belva Crome, MD.     Allergies:   Penicillins   Social History   Socioeconomic History  . Marital  status: Divorced    Spouse name: Not on file  . Number of children: 2  . Years of education: Not on file  . Highest education level: Some college, no degree  Occupational History  . Not on file  Social Needs  . Financial resource strain: Not on file  . Food insecurity    Worry: Not on file    Inability: Not on file  . Transportation needs    Medical: Not on file    Non-medical: Not on file  Tobacco Use  . Smoking status: Former Smoker    Types: Cigars    Quit date: 09/03/2017    Years since quitting: 1.2  . Smokeless tobacco: Never Used  Substance and Sexual Activity  . Alcohol use: No  . Drug use: No  . Sexual activity: Not on file  Lifestyle  . Physical activity    Days per week: Not on file    Minutes per session: Not on file  . Stress: Not on file  Relationships  . Social Herbalist on phone: Not on file    Gets together: Not on file    Attends religious service: Not on file    Active member of club or organization: Not on file    Attends meetings of clubs or organizations: Not on file    Relationship status: Not on file  Other Topics Concern  . Not on file  Social History Narrative  Lives at home alone    Left handed   Caffeine: 1-2 cups daily     Family History: The patient's Family history is unknown by patient.  ROS:   Please see the history of present illness.    *** All other systems reviewed and are negative.  EKGs/Labs/Other Studies Reviewed:    The following studies were reviewed today: ***  EKG:  EKG ***  Recent Labs: 03/04/2018: ALT 48; B Natriuretic Peptide 523.2; BUN 20; Creatinine, Ser 2.16; Hemoglobin 10.5; Platelets 92; Potassium 3.6; Sodium 139  Recent Lipid Panel No results found for: CHOL, TRIG, HDL, CHOLHDL, VLDL, LDLCALC, LDLDIRECT  Physical Exam:    VS:  There were no vitals taken for this visit.    Wt Readings from Last 3 Encounters:  11/08/18 144 lb 12.8 oz (65.7 kg)  10/26/18 136 lb (61.7 kg)  09/26/18 139  lb (63 kg)     GEN: ***. No acute distress HEENT: Normal NECK: No JVD. LYMPHATICS: No lymphadenopathy CARDIAC: *** RRR without murmur, gallop, or edema. VASCULAR: *** Normal Pulses. No bruits. RESPIRATORY:  Clear to auscultation without rales, wheezing or rhonchi  ABDOMEN: Soft, non-tender, non-distended, No pulsatile mass, MUSCULOSKELETAL: No deformity  SKIN: Warm and dry NEUROLOGIC:  Alert and oriented x 3 PSYCHIATRIC:  Normal affect   ASSESSMENT:    1. Chronic diastolic heart failure ([place])   2. PAH (pulmonary artery hypertension) ([place])   3. Chronic atrial flutter ([place])   4. Mitral valve disorder   5. Cor pulmonale, chronic ([place])   6. Encounter for therapeutic drug monitoring   7. Cachexia ([place])   8. Chronic anticoagulation - coumadin   9. Educated About Covid-19 Virus Infection    PLAN:    In order of problems listed above:  1. ***   Medication Adjustments/Labs and Tests Ordered: Current medicines are reviewed at length with the patient today.  Concerns regarding medicines are outlined above.  No orders of the defined types were placed in this encounter.  No orders of the defined types were placed in this encounter.   There are no Patient Instructions on file for this visit.   Signed, Sinclair Grooms, MD  11/22/2018 1:06 PM    [facility]

## 2018-11-24 ENCOUNTER — Telehealth (HOSPITAL_COMMUNITY): Payer: Self-pay | Admitting: *Deleted

## 2018-11-24 ENCOUNTER — Other Ambulatory Visit: Payer: Self-pay

## 2018-11-24 ENCOUNTER — Ambulatory Visit (HOSPITAL_COMMUNITY)
Admission: RE | Admit: 2018-11-24 | Discharge: 2018-11-24 | Disposition: A | Discharge status: Home / Self Care | Payer: 59 | Attending: Internal Medicine | Admitting: Internal Medicine

## 2018-11-24 ENCOUNTER — Encounter (HOSPITAL_COMMUNITY)
Admission: RE | Disposition: A | Discharge status: Home / Self Care | Payer: Self-pay | Source: Home / Self Care | Attending: Internal Medicine

## 2018-11-24 DIAGNOSIS — B1920 Unspecified viral hepatitis C without hepatic coma: Secondary | ICD-10-CM | POA: Diagnosis not present

## 2018-11-24 DIAGNOSIS — Z880 Allergy status to penicillin: Secondary | ICD-10-CM | POA: Diagnosis not present

## 2018-11-24 DIAGNOSIS — Z7901 Long term (current) use of anticoagulants: Secondary | ICD-10-CM | POA: Insufficient documentation

## 2018-11-24 DIAGNOSIS — Z8249 Family history of ischemic heart disease and other diseases of the circulatory system: Secondary | ICD-10-CM | POA: Diagnosis not present

## 2018-11-24 DIAGNOSIS — I4891 Unspecified atrial fibrillation: Secondary | ICD-10-CM | POA: Diagnosis not present

## 2018-11-24 DIAGNOSIS — I272 Pulmonary hypertension, unspecified: Secondary | ICD-10-CM | POA: Insufficient documentation

## 2018-11-24 DIAGNOSIS — Z79899 Other long term (current) drug therapy: Secondary | ICD-10-CM | POA: Insufficient documentation

## 2018-11-24 DIAGNOSIS — I4892 Unspecified atrial flutter: Secondary | ICD-10-CM | POA: Diagnosis not present

## 2018-11-24 DIAGNOSIS — I712 Thoracic aortic aneurysm, without rupture: Secondary | ICD-10-CM | POA: Insufficient documentation

## 2018-11-24 DIAGNOSIS — N183 Chronic kidney disease, stage 3 (moderate): Secondary | ICD-10-CM | POA: Insufficient documentation

## 2018-11-24 DIAGNOSIS — Z952 Presence of prosthetic heart valve: Secondary | ICD-10-CM | POA: Insufficient documentation

## 2018-11-24 DIAGNOSIS — I129 Hypertensive chronic kidney disease with stage 1 through stage 4 chronic kidney disease, or unspecified chronic kidney disease: Secondary | ICD-10-CM | POA: Insufficient documentation

## 2018-11-24 DIAGNOSIS — D649 Anemia, unspecified: Secondary | ICD-10-CM | POA: Insufficient documentation

## 2018-11-24 DIAGNOSIS — I2721 Secondary pulmonary arterial hypertension: Secondary | ICD-10-CM

## 2018-11-24 DIAGNOSIS — Z87891 Personal history of nicotine dependence: Secondary | ICD-10-CM | POA: Insufficient documentation

## 2018-11-24 DIAGNOSIS — N400 Enlarged prostate without lower urinary tract symptoms: Secondary | ICD-10-CM | POA: Insufficient documentation

## 2018-11-24 DIAGNOSIS — I5032 Chronic diastolic (congestive) heart failure: Secondary | ICD-10-CM

## 2018-11-24 HISTORY — PX: RIGHT HEART CATH: CATH118263

## 2018-11-24 LAB — POCT I-STAT EG7
Acid-base deficit: 3 mmol/L — ABNORMAL HIGH (ref 0.0–2.0)
Acid-base deficit: 3 mmol/L — ABNORMAL HIGH (ref 0.0–2.0)
Acid-base deficit: 6 mmol/L — ABNORMAL HIGH (ref 0.0–2.0)
Bicarbonate: 19.8 mmol/L — ABNORMAL LOW (ref 20.0–28.0)
Bicarbonate: 22.3 mmol/L (ref 20.0–28.0)
Bicarbonate: 22.9 mmol/L (ref 20.0–28.0)
Calcium, Ion: 0.93 mmol/L — ABNORMAL LOW (ref 1.15–1.40)
Calcium, Ion: 1.18 mmol/L (ref 1.15–1.40)
Calcium, Ion: 1.28 mmol/L (ref 1.15–1.40)
HCT: 30 % — ABNORMAL LOW (ref 39.0–52.0)
HCT: 33 % — ABNORMAL LOW (ref 39.0–52.0)
HCT: 35 % — ABNORMAL LOW (ref 39.0–52.0)
Hemoglobin: 10.2 g/dL — ABNORMAL LOW (ref 13.0–17.0)
Hemoglobin: 11.2 g/dL — ABNORMAL LOW (ref 13.0–17.0)
Hemoglobin: 11.9 g/dL — ABNORMAL LOW (ref 13.0–17.0)
O2 Saturation: 52 %
O2 Saturation: 54 %
O2 Saturation: 59 %
Potassium: 3.2 mmol/L — ABNORMAL LOW (ref 3.5–5.1)
Potassium: 3.6 mmol/L (ref 3.5–5.1)
Potassium: 3.8 mmol/L (ref 3.5–5.1)
Sodium: 142 mmol/L (ref 135–145)
Sodium: 143 mmol/L (ref 135–145)
Sodium: 147 mmol/L — ABNORMAL HIGH (ref 135–145)
TCO2: 21 mmol/L — ABNORMAL LOW (ref 22–32)
TCO2: 24 mmol/L (ref 22–32)
TCO2: 24 mmol/L (ref 22–32)
pCO2, Ven: 37.3 mmHg — ABNORMAL LOW (ref 44.0–60.0)
pCO2, Ven: 41.2 mmHg — ABNORMAL LOW (ref 44.0–60.0)
pCO2, Ven: 43.1 mmHg — ABNORMAL LOW (ref 44.0–60.0)
pH, Ven: 7.333 (ref 7.250–7.430)
pH, Ven: 7.334 (ref 7.250–7.430)
pH, Ven: 7.341 (ref 7.250–7.430)
pO2, Ven: 29 mmHg — CL (ref 32.0–45.0)
pO2, Ven: 30 mmHg — CL (ref 32.0–45.0)
pO2, Ven: 32 mmHg (ref 32.0–45.0)

## 2018-11-24 LAB — BASIC METABOLIC PANEL
Anion gap: 9 (ref 5–15)
BUN: 30 mg/dL — ABNORMAL HIGH (ref 8–23)
CO2: 22 mmol/L (ref 22–32)
Calcium: 9.3 mg/dL (ref 8.9–10.3)
Chloride: 105 mmol/L (ref 98–111)
Creatinine, Ser: 2.04 mg/dL — ABNORMAL HIGH (ref 0.61–1.24)
GFR calc Af Amer: 38 mL/min — ABNORMAL LOW (ref >60)
GFR calc non Af Amer: 33 mL/min — ABNORMAL LOW (ref >60)
Glucose, Bld: 88 mg/dL (ref 70–99)
Potassium: 3.8 mmol/L (ref 3.5–5.1)
Sodium: 136 mmol/L (ref 135–145)

## 2018-11-24 LAB — PROTIME-INR
INR: 2.1 — ABNORMAL HIGH (ref 0.8–1.2)
Prothrombin Time: 23 seconds — ABNORMAL HIGH (ref 11.4–15.2)

## 2018-11-24 LAB — CBC
HCT: 35.9 % — ABNORMAL LOW (ref 39.0–52.0)
Hemoglobin: 11.1 g/dL — ABNORMAL LOW (ref 13.0–17.0)
MCH: 33.6 pg (ref 26.0–34.0)
MCHC: 30.9 g/dL (ref 30.0–36.0)
MCV: 108.8 fL — ABNORMAL HIGH (ref 80.0–100.0)
Platelets: 92 10*3/uL — ABNORMAL LOW (ref 150–400)
RBC: 3.3 MIL/uL — ABNORMAL LOW (ref 4.22–5.81)
RDW: 21.1 % — ABNORMAL HIGH (ref 11.5–15.5)
WBC: 4 10*3/uL (ref 4.0–10.5)
nRBC: 0 % (ref 0.0–0.2)

## 2018-11-24 SURGERY — RIGHT HEART CATH
Anesthesia: LOCAL

## 2018-11-24 MED ORDER — SODIUM CHLORIDE 0.9% FLUSH: 3.0000 mL | Freq: Two times a day (BID) | INTRAVENOUS | Status: DC

## 2018-11-24 MED ORDER — ACETAMINOPHEN 325 MG PO TABS: 650.0000 mg | ORAL_TABLET | ORAL | Status: DC | PRN

## 2018-11-24 MED ORDER — SODIUM CHLORIDE 0.9 % IV SOLN: 250.0000 mL | INTRAVENOUS | Status: DC | PRN

## 2018-11-24 MED ORDER — SODIUM CHLORIDE 0.9% FLUSH: 3.0000 mL | INTRAVENOUS | Status: DC | PRN

## 2018-11-24 MED ORDER — HEPARIN (PORCINE) IN NACL 1000-0.9 UT/500ML-% IV SOLN
INTRAVENOUS | Status: AC
  Filled 2018-11-24: qty 500

## 2018-11-24 MED ORDER — POTASSIUM CHLORIDE CRYS ER 20 MEQ PO TBCR: 40.0000 meq | EXTENDED_RELEASE_TABLET | Freq: Every day | ORAL | 3 refills | Status: DC

## 2018-11-24 MED ORDER — LIDOCAINE HCL (PF) 1 % IJ SOLN
INTRAMUSCULAR | Status: DC | PRN
  Administered 2018-11-24: 2 mL

## 2018-11-24 MED ORDER — METOLAZONE 2.5 MG PO TABS: 2.5000 mg | ORAL_TABLET | Freq: Every day | ORAL | 3 refills | Status: DC | PRN

## 2018-11-24 MED ORDER — ASPIRIN 81 MG PO CHEW
CHEWABLE_TABLET | ORAL | Status: AC
  Filled 2018-11-24: qty 1

## 2018-11-24 MED ORDER — ONDANSETRON HCL 4 MG/2ML IJ SOLN: 4.0000 mg | Freq: Four times a day (QID) | INTRAMUSCULAR | Status: DC | PRN

## 2018-11-24 MED ORDER — ASPIRIN 81 MG PO CHEW
81.0000 mg | CHEWABLE_TABLET | ORAL | Status: AC
  Administered 2018-11-24: 12:00:00 81 mg via ORAL

## 2018-11-24 MED ORDER — LIDOCAINE HCL (PF) 1 % IJ SOLN
INTRAMUSCULAR | Status: AC
  Filled 2018-11-24: qty 30

## 2018-11-24 MED ORDER — SODIUM CHLORIDE 0.9 % IV SOLN: INTRAVENOUS | Status: DC

## 2018-11-24 MED ORDER — HEPARIN (PORCINE) IN NACL 1000-0.9 UT/500ML-% IV SOLN
INTRAVENOUS | Status: DC | PRN
  Administered 2018-11-24: 500 mL

## 2018-11-24 SURGICAL SUPPLY — 9 items
CATH BALLN WEDGE 5F 110CM (CATHETERS) ×1 IMPLANT
GUIDEWIRE .025 260CM (WIRE) ×1 IMPLANT
PACK CARDIAC CATHETERIZATION (CUSTOM PROCEDURE TRAY) ×2 IMPLANT
PROTECTION STATION PRESSURIZED (MISCELLANEOUS) ×2
SHEATH GLIDE SLENDER 4/5FR (SHEATH) ×1 IMPLANT
STATION PROTECTION PRESSURIZED (MISCELLANEOUS) IMPLANT
TRANSDUCER W/STOPCOCK (MISCELLANEOUS) ×2 IMPLANT
TUBING ART PRESS 72  MALE/FEM (TUBING) ×1
TUBING ART PRESS 72 MALE/FEM (TUBING) IMPLANT

## 2018-11-24 NOTE — Interval H&P Note (Signed)
History and Physical Interval Note:  11/24/2018 3:07 PM  [name]  has presented today for surgery, with the diagnosis of heart failure.  The various methods of treatment have been discussed with the patient and family. After consideration of risks, benefits and other options for treatment, the patient has consented to  Procedure(s): RIGHT HEART CATH (N/A) as a surgical intervention.  The patient's history has been reviewed, patient examined, no change in status, stable for surgery.  I have reviewed the patient's chart and labs.  Questions were answered to the patient's satisfaction.     Keryn Nessler

## 2018-11-24 NOTE — Discharge Instructions (Signed)
Right Heart Cath, Care After This sheet gives you information about how to care for yourself after your procedure. Your health care provider may also give you more specific instructions. If you have problems or questions, contact your health care provider. What can I expect after the procedure? After the procedure, it is common to have:  Bruising or mild discomfort in the area where the IV was inserted (insertion site). Follow these instructions at home: Eating and drinking   Follow instructions from your health care provider about eating or drinking restrictions. General instructions  Check your IV insertion area every day for signs of infection. Check for: ? Redness, swelling, or pain. ? Fluid or blood. ? Warmth. ? Pus or a bad smell.  Take over-the-counter and prescription medicines only as told by your health care provider.  Rest and return to your normal activities as told by your health care provider. Ask your health care provider what activities are safe for you.  Keep all follow-up visits as told by your health care provider. This is important. Contact a health care provider if:  You have a fever that does not get better with medicine.  You feel nauseous.  You vomit.  You have redness, swelling, or pain around the insertion site.  You have fluid or blood coming from the insertion site.  Your insertion area feels warm to the touch.  You have pus or a bad smell coming from the insertion site. Get help right away if:  You have difficulty breathing or shortness of breath.  You develop chest pain.  You faint.  You feel very dizzy. These symptoms may represent a serious problem that is an emergency. Do not wait to see if the symptoms will go away. Get medical help right away. Call your local emergency services (911 in the [place] Do not drive yourself to the hospital. Summary  After your procedure, it is common to have bruising or mild discomfort in the area where  the IV was inserted.  You should check your IV insertion area every day for signs of infection.  Take over-the-counter and prescription medicines only as told by your health care provider. This information is not intended to replace advice given to you by your health care provider. Make sure you discuss any questions you have with your health care provider. Document Released: 01/10/2013 Document Revised: 03/04/2017 Document Reviewed: 02/14/2016 Elsevier Patient Education  2020 [employer].

## 2018-11-24 NOTE — Telephone Encounter (Signed)
Per Dr Haroldine Laws pt needs KCL 40 meq daily and Metolazone 2.5 mg prn sent to pt's pharmacy, meds sent

## 2018-11-27 ENCOUNTER — Encounter (HOSPITAL_COMMUNITY): Payer: Self-pay | Admitting: Internal Medicine

## 2018-12-01 ENCOUNTER — Ambulatory Visit (INDEPENDENT_AMBULATORY_CARE_PROVIDER_SITE_OTHER): Payer: 59

## 2018-12-01 ENCOUNTER — Other Ambulatory Visit: Payer: Self-pay

## 2018-12-01 DIAGNOSIS — Z5181 Encounter for therapeutic drug level monitoring: Secondary | ICD-10-CM

## 2018-12-01 DIAGNOSIS — I4892 Unspecified atrial flutter: Secondary | ICD-10-CM

## 2018-12-01 DIAGNOSIS — I059 Rheumatic mitral valve disease, unspecified: Secondary | ICD-10-CM

## 2018-12-01 LAB — POCT INR: INR: 3.7 — AB (ref 2.0–3.0)

## 2018-12-01 NOTE — Patient Instructions (Addendum)
Description   Skip today's dosage of Coumadin, then resume same dosage 1 tablet daily except for 1/2 tablet on Thursdays. Recheck INR in 3 weeks. Continue drinking 2 Ensures each day.  Please call [phone] if you have any procedures or medication changes.

## 2018-12-22 ENCOUNTER — Ambulatory Visit (INDEPENDENT_AMBULATORY_CARE_PROVIDER_SITE_OTHER): Payer: 59

## 2018-12-22 ENCOUNTER — Other Ambulatory Visit: Payer: Self-pay

## 2018-12-22 ENCOUNTER — Encounter (HOSPITAL_COMMUNITY): Payer: Self-pay | Admitting: Internal Medicine

## 2018-12-22 ENCOUNTER — Ambulatory Visit (HOSPITAL_COMMUNITY)
Admission: RE | Admit: 2018-12-22 | Discharge: 2018-12-22 | Disposition: A | Discharge status: Home / Self Care | Payer: 59 | Source: Ambulatory Visit | Attending: Internal Medicine | Admitting: Internal Medicine

## 2018-12-22 VITALS — BP 90/68 | HR 68 | Wt 136.4 lb

## 2018-12-22 DIAGNOSIS — I059 Rheumatic mitral valve disease, unspecified: Secondary | ICD-10-CM | POA: Diagnosis not present

## 2018-12-22 DIAGNOSIS — I4892 Unspecified atrial flutter: Secondary | ICD-10-CM | POA: Diagnosis not present

## 2018-12-22 DIAGNOSIS — Z5181 Encounter for therapeutic drug level monitoring: Secondary | ICD-10-CM

## 2018-12-22 DIAGNOSIS — N183 Chronic kidney disease, stage 3 (moderate): Secondary | ICD-10-CM | POA: Insufficient documentation

## 2018-12-22 DIAGNOSIS — I5030 Unspecified diastolic (congestive) heart failure: Secondary | ICD-10-CM | POA: Insufficient documentation

## 2018-12-22 DIAGNOSIS — I712 Thoracic aortic aneurysm, without rupture: Secondary | ICD-10-CM | POA: Insufficient documentation

## 2018-12-22 DIAGNOSIS — D649 Anemia, unspecified: Secondary | ICD-10-CM | POA: Diagnosis not present

## 2018-12-22 DIAGNOSIS — Z7901 Long term (current) use of anticoagulants: Secondary | ICD-10-CM | POA: Insufficient documentation

## 2018-12-22 DIAGNOSIS — Z79899 Other long term (current) drug therapy: Secondary | ICD-10-CM | POA: Insufficient documentation

## 2018-12-22 DIAGNOSIS — J439 Emphysema, unspecified: Secondary | ICD-10-CM | POA: Insufficient documentation

## 2018-12-22 DIAGNOSIS — Z8719 Personal history of other diseases of the digestive system: Secondary | ICD-10-CM | POA: Diagnosis not present

## 2018-12-22 DIAGNOSIS — Z792 Long term (current) use of antibiotics: Secondary | ICD-10-CM | POA: Insufficient documentation

## 2018-12-22 DIAGNOSIS — N400 Enlarged prostate without lower urinary tract symptoms: Secondary | ICD-10-CM | POA: Diagnosis not present

## 2018-12-22 DIAGNOSIS — I5032 Chronic diastolic (congestive) heart failure: Secondary | ICD-10-CM | POA: Diagnosis not present

## 2018-12-22 DIAGNOSIS — Z87891 Personal history of nicotine dependence: Secondary | ICD-10-CM | POA: Diagnosis not present

## 2018-12-22 DIAGNOSIS — I4891 Unspecified atrial fibrillation: Secondary | ICD-10-CM | POA: Diagnosis not present

## 2018-12-22 DIAGNOSIS — I130 Hypertensive heart and chronic kidney disease with heart failure and stage 1 through stage 4 chronic kidney disease, or unspecified chronic kidney disease: Secondary | ICD-10-CM | POA: Diagnosis not present

## 2018-12-22 DIAGNOSIS — Z952 Presence of prosthetic heart valve: Secondary | ICD-10-CM | POA: Diagnosis not present

## 2018-12-22 DIAGNOSIS — Z880 Allergy status to penicillin: Secondary | ICD-10-CM | POA: Insufficient documentation

## 2018-12-22 LAB — CBC
HCT: 32.3 % — ABNORMAL LOW (ref 39.0–52.0)
Hemoglobin: 10.5 g/dL — ABNORMAL LOW (ref 13.0–17.0)
MCH: 34.5 pg — ABNORMAL HIGH (ref 26.0–34.0)
MCHC: 32.5 g/dL (ref 30.0–36.0)
MCV: 106.3 fL — ABNORMAL HIGH (ref 80.0–100.0)
Platelets: 96 10*3/uL — ABNORMAL LOW (ref 150–400)
RBC: 3.04 MIL/uL — ABNORMAL LOW (ref 4.22–5.81)
RDW: 21.5 % — ABNORMAL HIGH (ref 11.5–15.5)
WBC: 3.8 10*3/uL — ABNORMAL LOW (ref 4.0–10.5)
nRBC: 0 % (ref 0.0–0.2)

## 2018-12-22 LAB — COMPREHENSIVE METABOLIC PANEL
ALT: 44 U/L (ref 0–44)
AST: 140 U/L — ABNORMAL HIGH (ref 15–41)
Albumin: 3.8 g/dL (ref 3.5–5.0)
Alkaline Phosphatase: 70 U/L (ref 38–126)
Anion gap: 7 (ref 5–15)
BUN: 37 mg/dL — ABNORMAL HIGH (ref 8–23)
CO2: 24 mmol/L (ref 22–32)
Calcium: 9.4 mg/dL (ref 8.9–10.3)
Chloride: 105 mmol/L (ref 98–111)
Creatinine, Ser: 2.35 mg/dL — ABNORMAL HIGH (ref 0.61–1.24)
GFR calc Af Amer: 32 mL/min — ABNORMAL LOW (ref >60)
GFR calc non Af Amer: 28 mL/min — ABNORMAL LOW (ref >60)
Glucose, Bld: 87 mg/dL (ref 70–99)
Potassium: 3.9 mmol/L (ref 3.5–5.1)
Sodium: 136 mmol/L (ref 135–145)
Total Bilirubin: 2.7 mg/dL — ABNORMAL HIGH (ref 0.3–1.2)
Total Protein: 8.7 g/dL — ABNORMAL HIGH (ref 6.5–8.1)

## 2018-12-22 LAB — POCT INR: INR: 2.7 (ref 2.0–3.0)

## 2018-12-22 LAB — BRAIN NATRIURETIC PEPTIDE: B Natriuretic Peptide: 376.5 pg/mL — ABNORMAL HIGH (ref 0.0–100.0)

## 2018-12-22 NOTE — Patient Instructions (Signed)
Description   Continue on same dosage 1 tablet daily except for 1/2 tablet on Thursdays. Recheck INR in 4 weeks. Continue drinking 2 Ensures each day.  Please call [phone] if you have any procedures or medication changes.

## 2018-12-22 NOTE — Patient Instructions (Addendum)
No medication changes today!  Labs today We will only contact you if something comes back abnormal or we need to make some changes. Otherwise no news is good news!   You have been ordered a PYP Scan.  This is done in the Radiology Department of [facility].   This scan assess for protein on your heart that can affect your heart function.   When you come for this test please plan to be there 2-3 hours.   Your physician recommends that you schedule a follow-up appointment in: 2-3 months with Dr Haroldine Laws.    At the [facility], you and your health needs are our priority. As part of our continuing mission to provide you with exceptional heart care, we have created designated Provider Care Teams. These Care Teams include your primary Cardiologist (physician) and Advanced Practice Providers (APPs- Physician Assistants and Nurse Practitioners) who all work together to provide you with the care you need, when you need it.   You may see any of the following providers on your designated Care Team at your next follow up: Marland Kitchen Dr Glori Bickers . Dr Loralie Champagne . Darrick Grinder, NP   Please be sure to bring in all your medications bottles to every appointment.

## 2018-12-22 NOTE — Progress Notes (Signed)
ADVANCED HF CLINIC NOTE   Primary Cardiologist: Dr. Daneen Schick  HPI:  [name] is a 68 y.o. male who is referred by Dr. 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 Other problems include hepatitis C (s/p curative therapy), chronic atrial flutter, ascending aortic aneurysm 4.5 cm, chronic kidney disease stage III,chronic anemia.    CTA of chest 6/20 neg for PE but dilated main pulmonary artery at 4.5 cm. + emphysema. Ectatic ascending aorta 4.4 cm.   Echo 7/20 EF 60-65%, moderately increased LV wall thickness.  Mod LVH. RV with severely reducedsystolic function pressure moderately elevated at 54.9 mmHg mildly dilated aortic root 4.4 cm, mild AI s/p MVR with mean gradient of 5 , severe LAE, severe RV dysfunction mild TR estimated RVSP moderately elevated.    Earlier this summer started feeling worse. We saw him for the first time on 11/08/18/ More SOB, more LE edema. NYHA IIIB symptoms. Underwent RHC to assess need for inotropes  RHC on 11/24/18  RA = 17 RV = 71/17 PA = 71/27 (45) PCW = 27 (v = 39) Fick cardiac output/index = 3.7/1.96 PVR = 5.0 WU Ao sat = 98% PA sat = 52%, 54% PaPI = 3.17  After cath doubled torsemide to 40 bid. Says it has helped a little bit. Weight down 144->136. Says breathing is a little better. Still with good days and bad days. Lives on 3rd floor. Can walk up but gets winded at the top. Still with LE edema. Watching diet and fluid intake closely. No dizziness.    Smoked < 1/2 ppd x many years. Quit 1-2 years ago    Past Medical History:  Diagnosis Date  . Allergy   . Aneurysm of ascending aorta (HCC)   . Atrial fibrillation ([place])   . BPH (benign prostatic hyperplasia)   . Colon polyps   . FHx: rheumatic heart disease   . H/O  diplopia   . Hepatitis C   . Hypertension   . Hypogonadism male   . Mitral valve disease     Current Outpatient Medications  Medication Sig Dispense Refill  . amoxicillin (AMOXIL) 500 MG capsule TAKE FOUR CAPSULES BY MOUTH 1 HOUR BEFORE DENTAL APPOINTMENT (Patient taking differently: Take 2,000 mg by mouth See admin instructions. For Dental Procedures) 4 capsule 0  . furosemide (LASIX) 20 MG tablet Take 20 mg by mouth daily.     . IRON PO Take 1 tablet by mouth every other day.    . metolazone (ZAROXOLYN) 2.5 MG tablet Take 1 tablet (2.5 mg total) by mouth daily as needed. 15 tablet 3  . Multiple Vitamin (MULTIVITAMIN WITH MINERALS) TABS tablet Take 1 tablet by mouth daily.    . potassium chloride SA (K-DUR) 20 MEQ tablet Take 2 tablets (40 mEq total) by mouth daily. 60 tablet 3  . sildenafil (VIAGRA) 100 MG tablet Take 100 mg by mouth as needed.    . tadalafil (CIALIS) 5 MG tablet TAKE 1 TABLET BY MOUTH AS NEEDED AS DIRECTED    . testosterone cypionate (DEPOTESTOSTERONE CYPIONATE) 200 MG/ML injection Inject 200 mg into the muscle every Thursday.     . warfarin (COUMADIN) 5 MG tablet Take 5 mg by mouth See admin instructions. Take 5 mg every day except Friday take 7.5 mg    .  zolpidem (AMBIEN) 5 MG tablet Take 1-2 tablets by mouth at bedtime as needed for sleep.      No current facility-administered medications for this encounter.     Allergies  Allergen Reactions  . Penicillins Rash    Did it involve swelling of the face/tongue/throat, SOB, or low BP? No Did it involve sudden or severe rash/hives, skin peeling, or any reaction on the inside of your mouth or nose? No Did you need to seek medical attention at a hospital or doctor's office? No When did it last happen?30 + years If all above answers are "NO", may proceed with cephalosporin use.       Social History   Socioeconomic History  . Marital status: Divorced    Spouse name: Not on file  . Number of children: 2  .  Years of education: Not on file  . Highest education level: Some college, no degree  Occupational History  . Not on file  Social Needs  . Financial resource strain: Not on file  . Food insecurity    Worry: Not on file    Inability: Not on file  . Transportation needs    Medical: Not on file    Non-medical: Not on file  Tobacco Use  . Smoking status: Former Smoker    Types: Cigars    Quit date: 09/03/2017    Years since quitting: 1.3  . Smokeless tobacco: Never Used  Substance and Sexual Activity  . Alcohol use: No  . Drug use: No  . Sexual activity: Not on file  Lifestyle  . Physical activity    Days per week: Not on file    Minutes per session: Not on file  . Stress: Not on file  Relationships  . Social Herbalist on phone: Not on file    Gets together: Not on file    Attends religious service: Not on file    Active member of club or organization: Not on file    Attends meetings of clubs or organizations: Not on file    Relationship status: Not on file  . Intimate partner violence    Fear of current or ex partner: Not on file    Emotionally abused: Not on file    Physically abused: Not on file    Forced sexual activity: Not on file  Other Topics Concern  . Not on file  Social History Narrative   Lives at home alone    Left handed   Caffeine: 1-2 cups daily   Fhx: No FHX of premature CAD or SCD. Personally reviewed Vitals:   12/22/18 1050  BP: 90/68  Pulse: 68  SpO2: 99%  Weight: 61.9 kg (136 lb 6.4 oz)    PHYSICAL EXAM: General:  Thin male  No resp difficulty HEENT: normal Neck: supple. no JVD. Carotids 2+ bilat; no bruits. No lymphadenopathy or thryomegaly appreciated. Cor: PMI nondisplaced. Irregular  2/6 TR Lungs: clear Abdomen: soft, nontender, nondistended. No hepatosplenomegaly. No bruits or masses. Good bowel sounds. Extremities: no cyanosis, clubbing, rash, 1-2+ edema L>R  Neuro: alert & orientedx3, cranial nerves grossly intact. moves  all 4 extremities w/o difficulty. Affect pleasant  ASSESSMENT & PLAN:  1.  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 LVEF normal. RV severely dilated and HK. RVSP 55 - RHC 8/20 with significantly elevated biventricular pressures, moderate to severe mixed PAH and low output (CI 1.96) - Mild symptomatic  improvement with increased diuresis after RHC - Now NYHA II - Volume status improved. Continue torsemide 40 bid (med rec incorrect) - He has evidence of low output. Options likely very limited. 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 - We discussed these options in detail as well as palliative milrinone though I prefer not to have indwelling PICC with mechanical MV - Currently he wants to stay with present therapy. I will follow closely to continue discussions and help to manage volume status - check labs today  2. 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   - mild MS on last echo .on coumadin  3.  Chronic a flutter  - rate controlled on coumadin. No bleeding  4.  Thoracic aneurysm followed by Dr. Cyndia Bent  - CT chest 6/20 asc ao aorta 4.4cm   - not surgical candidate  Total time spent 45 minutes. Over half that time spent discussing above.   Glori Bickers, MD  11:13 AM

## 2018-12-25 LAB — MULTIPLE MYELOMA PANEL, SERUM
Albumin SerPl Elph-Mcnc: 4 g/dL (ref 2.9–4.4)
Albumin/Glob SerPl: 1.1 (ref 0.7–1.7)
Alpha 1: 0.2 g/dL (ref 0.0–0.4)
Alpha2 Glob SerPl Elph-Mcnc: 0.5 g/dL (ref 0.4–1.0)
B-Globulin SerPl Elph-Mcnc: 0.8 g/dL (ref 0.7–1.3)
Gamma Glob SerPl Elph-Mcnc: 2.6 g/dL — ABNORMAL HIGH (ref 0.4–1.8)
Globulin, Total: 4 g/dL — ABNORMAL HIGH (ref 2.2–3.9)
IgA: 173 mg/dL (ref 61–437)
IgG (Immunoglobin G), Serum: 3133 mg/dL — ABNORMAL HIGH (ref 603–1613)
IgM (Immunoglobulin M), Srm: 65 mg/dL (ref 20–172)
Total Protein ELP: 8 g/dL (ref 6.0–8.5)

## 2018-12-26 ENCOUNTER — Telehealth (HOSPITAL_COMMUNITY): Payer: Self-pay

## 2018-12-26 NOTE — Telephone Encounter (Signed)
Pt aware of results. Negative for multiple myeloma. Pt appreciative

## 2018-12-26 NOTE — Telephone Encounter (Signed)
-----   Message from Jolaine Artist, MD sent at 12/25/2018  6:41 PM EDT ----- No M-spike

## 2019-01-05 ENCOUNTER — Other Ambulatory Visit (HOSPITAL_COMMUNITY): Payer: Self-pay | Admitting: Cardiology

## 2019-01-05 MED ORDER — FUROSEMIDE 20 MG PO TABS: 20.0000 mg | ORAL_TABLET | Freq: Every day | ORAL | 3 refills | Status: DC

## 2019-01-19 ENCOUNTER — Other Ambulatory Visit: Payer: Self-pay

## 2019-01-19 ENCOUNTER — Ambulatory Visit: Payer: 59 | Admitting: *Deleted

## 2019-01-19 DIAGNOSIS — Z5181 Encounter for therapeutic drug level monitoring: Secondary | ICD-10-CM

## 2019-01-19 DIAGNOSIS — I4892 Unspecified atrial flutter: Secondary | ICD-10-CM

## 2019-01-19 DIAGNOSIS — I059 Rheumatic mitral valve disease, unspecified: Secondary | ICD-10-CM | POA: Diagnosis not present

## 2019-01-19 LAB — POCT INR: INR: 2.7 (ref 2.0–3.0)

## 2019-01-19 NOTE — Patient Instructions (Addendum)
Description   Continue on same dosage 1 tablet daily except for 1/2 tablet on Thursdays. Recheck INR in 5 weeks. Continue drinking 2 Ensures each day.  Please call [phone] if you have any procedures or medication changes.

## 2019-01-25 ENCOUNTER — Encounter: Payer: Self-pay | Admitting: Student in an Organized Health Care Education/Training Program

## 2019-01-25 ENCOUNTER — Ambulatory Visit (INDEPENDENT_AMBULATORY_CARE_PROVIDER_SITE_OTHER): Payer: 59 | Admitting: Student in an Organized Health Care Education/Training Program

## 2019-01-25 ENCOUNTER — Other Ambulatory Visit: Payer: Self-pay

## 2019-01-25 DIAGNOSIS — Z23 Encounter for immunization: Secondary | ICD-10-CM | POA: Diagnosis not present

## 2019-01-25 DIAGNOSIS — Z7689 Persons encountering health services in other specified circumstances: Secondary | ICD-10-CM | POA: Diagnosis not present

## 2019-01-25 NOTE — Patient Instructions (Signed)
It was a pleasure to see you today!  To summarize our discussion for this visit:  We covered a lot of information today to get familiar with your background.  Please continue to follow-up with your heart and kidney doctor.  I would recommend you follow-up to get your Tdap and pneumonia vaccines at her next appointment.  I would also like you to think about possibly getting a screening chest CT to evaluate and rule out lung cancer given your history of smoking.  Some additional health maintenance measures we should update are: Health Maintenance Due  Topic Date Due  . TETANUS/TDAP  05/30/1971  . PNA vac Low Risk Adult (1 of 2 - PCV13) 05/29/2017  . INFLUENZA VACCINE  11/04/2018  .   Please return to our clinic to see me in approximately 2 months.  Call the clinic at [phone] if your symptoms worsen or you have any concerns.   Thank you for allowing me to take part in your care,  Dr. Doristine Mango

## 2019-01-25 NOTE — Progress Notes (Signed)
Subjective:    Patient ID: [name], male    DOB: 15-Nov-1950, 68 y.o.   MRN: [id number]  CC: Establish care, no specific complaints today  HPI:  Denies any recent illness or fevers. 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 Diet: Endorses having a relatively healthy diet. Breakfast-oatmeal/bagel/orange juice Lunch-tuna sandwich and chips Dinner-chicken or salad or steak or fish Also drinks 2-3 ensures per day which was recommended by physician for weight gain. Patient does not smoke tobacco or drink alcohol. Patient is previous tobacco smoker began at 68 year old and smoked less than a pack a day quit a couple of years ago.  Was not a consistent smoker throughout that time and was intermittent. Activity level: Patient works 4 days a week 10 hours/day with sometimes a lot of walking.  Does not get any exercise on his days off from work. Lives alone.  Feels safe at home. Does not have pets.  -Patient is followed closely by heart doctor for heart failure.  He also has artificial mitral valve. -He also follows with [facility] for chronic kidney disease. -He follows with urology for stream difficulties but denies having history of prostate issues.  Endorses having good urine output.  - Weight loss-patient endorses having weight loss over the last 6 months which is unintentional.  Denies any fevers or night sweats.  Denies any hemoptysis or worsening from baseline respiration.  Review of chart reveals that patient is approximately at his baseline weight at this time.  Patient states that he was worked up for weight loss by his heart doctor.  Tested for lymphoma which was negative.  Reviewed medications in chart with patient and he confirms that they are accurate. Patient has had recent basic labs run.  Patient is due for flu shot, Tdap, Pneumovax today.  He is willing to get his high-dose flu shot today but would like to wait until his next appointment  to get the pneumo and Tdap.  Smoking status reviewed   ROS: pertinent noted in the HPI   Past Medical History:  Diagnosis Date  . Allergy   . Aneurysm of ascending aorta (HCC)   . Atrial fibrillation ([place])   . BPH (benign prostatic hyperplasia)   . Colon polyps   . FHx: rheumatic heart disease   . H/O diplopia   . Hepatitis C   . Hypertension   . Hypogonadism male   . Mitral valve disease    Past Surgical History:  Procedure Laterality Date  . COLON SURGERY    . COLONOSCOPY    . COLONOSCOPY N/A 11/14/2012   Procedure: COLONOSCOPY;  Surgeon: Lear Ng, MD;  Location: [facility];  Service: Endoscopy;  Laterality: N/A;  . ESOPHAGOGASTRODUODENOSCOPY (EGD) WITH PROPOFOL N/A 05/08/2014   Procedure: ESOPHAGOGASTRODUODENOSCOPY (EGD) WITH PROPOFOL;  Surgeon: Lear Ng, MD;  Location: [facility];  Service: Endoscopy;  Laterality: N/A;  . MITRAL VALVE REPLACEMENT    . RIGHT HEART CATH N/A 11/24/2018   Procedure: RIGHT HEART CATH;  Surgeon: Jolaine Artist, MD;  Location: [facility];  Service: Cardiovascular;  Laterality: N/A;   I have personally reviewed pertinent past medical history, surgical, family, and social history as appropriate.  Objective:  BP (!) 122/58   Pulse (!) 42   Wt 143 lb 3.2 oz (65 kg)   SpO2 97%   BMI 18.89 kg/m   Vitals and nursing note reviewed  General: NAD, pleasant, able to participate in  exam, hearing deficit Cardiac:  S1 S2 present.  Irregular and slow rate, click present, JVD present, pale capillary beds and fingers with good quick refill.  Negative for clubbing.  1+ pitting edema bilateral ankles. Respiratory: CTAB, normal effort, No wheezes, rales or rhonchi Skin: warm and dry, no rashes noted Neuro: alert, no obvious focal deficits Psych: Normal affect and mood  Assessment & Plan:    Encounter to establish care New patient establishing care with complex medical history being managed by multiple specialists.  Reviewed patient's past medical history, medications, health maintenance. Identified the patient is due for flu, pneumo, Tdap vaccinations. Would recommend low-dose CT scan screening for lung cancer. Follow-up with heart and kidney doctors. Return for follow-up in approximately 2 months.  I independently examined pertinent imaging in relation to problem.  Doristine Mango, [facility] PGY-2

## 2019-01-25 NOTE — Assessment & Plan Note (Signed)
New patient establishing care with complex medical history being managed by multiple specialists. Reviewed patient's past medical history, medications, health maintenance. Identified the patient is due for flu, pneumo, Tdap vaccinations. Would recommend low-dose CT scan screening for lung cancer. Follow-up with heart and kidney doctors. Return for follow-up in approximately 2 months.

## 2019-02-22 ENCOUNTER — Other Ambulatory Visit: Payer: Self-pay

## 2019-02-22 ENCOUNTER — Ambulatory Visit (INDEPENDENT_AMBULATORY_CARE_PROVIDER_SITE_OTHER): Payer: 59 | Admitting: *Deleted

## 2019-02-22 ENCOUNTER — Encounter (HOSPITAL_COMMUNITY): Payer: Self-pay | Admitting: Internal Medicine

## 2019-02-22 ENCOUNTER — Ambulatory Visit (HOSPITAL_COMMUNITY)
Admission: RE | Admit: 2019-02-22 | Discharge: 2019-02-22 | Disposition: A | Discharge status: Home / Self Care | Payer: 59 | Source: Ambulatory Visit | Attending: Internal Medicine | Admitting: Internal Medicine

## 2019-02-22 VITALS — BP 100/60 | HR 60 | Wt 142.0 lb

## 2019-02-22 DIAGNOSIS — R5383 Other fatigue: Secondary | ICD-10-CM | POA: Diagnosis not present

## 2019-02-22 DIAGNOSIS — N183 Chronic kidney disease, stage 3 unspecified: Secondary | ICD-10-CM | POA: Insufficient documentation

## 2019-02-22 DIAGNOSIS — I059 Rheumatic mitral valve disease, unspecified: Secondary | ICD-10-CM

## 2019-02-22 DIAGNOSIS — I4892 Unspecified atrial flutter: Secondary | ICD-10-CM

## 2019-02-22 DIAGNOSIS — Z87891 Personal history of nicotine dependence: Secondary | ICD-10-CM | POA: Insufficient documentation

## 2019-02-22 DIAGNOSIS — Z79899 Other long term (current) drug therapy: Secondary | ICD-10-CM | POA: Diagnosis not present

## 2019-02-22 DIAGNOSIS — Z7901 Long term (current) use of anticoagulants: Secondary | ICD-10-CM | POA: Diagnosis not present

## 2019-02-22 DIAGNOSIS — I130 Hypertensive heart and chronic kidney disease with heart failure and stage 1 through stage 4 chronic kidney disease, or unspecified chronic kidney disease: Secondary | ICD-10-CM | POA: Diagnosis not present

## 2019-02-22 DIAGNOSIS — B1920 Unspecified viral hepatitis C without hepatic coma: Secondary | ICD-10-CM | POA: Insufficient documentation

## 2019-02-22 DIAGNOSIS — I712 Thoracic aortic aneurysm, without rupture: Secondary | ICD-10-CM | POA: Insufficient documentation

## 2019-02-22 DIAGNOSIS — Z5181 Encounter for therapeutic drug level monitoring: Secondary | ICD-10-CM

## 2019-02-22 DIAGNOSIS — I2721 Secondary pulmonary arterial hypertension: Secondary | ICD-10-CM | POA: Diagnosis not present

## 2019-02-22 DIAGNOSIS — I5032 Chronic diastolic (congestive) heart failure: Secondary | ICD-10-CM | POA: Diagnosis present

## 2019-02-22 DIAGNOSIS — D649 Anemia, unspecified: Secondary | ICD-10-CM | POA: Diagnosis not present

## 2019-02-22 DIAGNOSIS — I4891 Unspecified atrial fibrillation: Secondary | ICD-10-CM | POA: Insufficient documentation

## 2019-02-22 DIAGNOSIS — Z952 Presence of prosthetic heart valve: Secondary | ICD-10-CM | POA: Insufficient documentation

## 2019-02-22 LAB — BASIC METABOLIC PANEL
Anion gap: 10 (ref 5–15)
BUN: 28 mg/dL — ABNORMAL HIGH (ref 8–23)
CO2: 25 mmol/L (ref 22–32)
Calcium: 9.2 mg/dL (ref 8.9–10.3)
Chloride: 102 mmol/L (ref 98–111)
Creatinine, Ser: 2.15 mg/dL — ABNORMAL HIGH (ref 0.61–1.24)
GFR calc Af Amer: 36 mL/min — ABNORMAL LOW (ref >60)
GFR calc non Af Amer: 31 mL/min — ABNORMAL LOW (ref >60)
Glucose, Bld: 95 mg/dL (ref 70–99)
Potassium: 3.4 mmol/L — ABNORMAL LOW (ref 3.5–5.1)
Sodium: 137 mmol/L (ref 135–145)

## 2019-02-22 LAB — BRAIN NATRIURETIC PEPTIDE: B Natriuretic Peptide: 633.8 pg/mL — ABNORMAL HIGH (ref 0.0–100.0)

## 2019-02-22 LAB — POCT INR: INR: 3.3 — AB (ref 2.0–3.0)

## 2019-02-22 NOTE — Patient Instructions (Signed)
Labs today We will only contact you if something comes back abnormal or we need to make some changes. Otherwise no news is good news!  You have been ordered a PYP Scan.  This is done in the Radiology Department of [facility].  When you come for this test please plan to be there 2-3 hours.  They will call you to schedule this appointment.   Your physician recommends that you schedule a follow-up appointment in: 3-4 months with Dr Haroldine Laws.    At the [facility], you and your health needs are our priority. As part of our continuing mission to provide you with exceptional heart care, we have created designated Provider Care Teams. These Care Teams include your primary Cardiologist (physician) and Advanced Practice Providers (APPs- Physician Assistants and Nurse Practitioners) who all work together to provide you with the care you need, when you need it.   You may see any of the following providers on your designated Care Team at your next follow up: Marland Kitchen Dr Glori Bickers . Dr Loralie Champagne . Darrick Grinder, NP . Lyda Jester, PA   Please be sure to bring in all your medications bottles to every appointment.

## 2019-02-22 NOTE — Patient Instructions (Signed)
Description   Continue on same dosage 1 tablet daily except for 1/2 tablet on Thursdays. Recheck INR in 6 weeks. Continue drinking 2 Ensures each day.  Please call [phone] if you have any procedures or medication changes.

## 2019-02-22 NOTE — Progress Notes (Addendum)
ADVANCED HF CLINIC NOTE   Primary Cardiologist: Dr. Daneen Schick  HPI:  [name] is a 68 y.o. male who is referred by Dr. 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 Other problems include hepatitis C (s/p curative therapy), chronic atrial flutter, ascending aortic aneurysm 4.5 cm, chronic kidney disease stage III,chronic anemia.    CTA of chest 6/20 neg for PE but dilated main pulmonary artery at 4.5 cm. + emphysema. Ectatic ascending aorta 4.4 cm.   Echo 7/20 EF 60-65%, moderately increased LV wall thickness.  Mod LVH. RV with severely reducedsystolic function pressure moderately elevated at 54.9 mmHg mildly dilated aortic root 4.4 cm, mild AI s/p MVR with mean gradient of 5 , severe LAE, severe RV dysfunction mild TR estimated RVSP moderately elevated.    Earlier this summer started feeling worse. We saw him for the first time on 11/08/18/ More SOB, more LE edema. NYHA IIIB symptoms. Underwent RHC to assess need for inotropes  RHC on 11/24/18  RA = 17 RV = 71/17 PA = 71/27 (45) PCW = 27 (v = 39) Fick cardiac output/index = 3.7/1.96 PVR = 5.0 WU Ao sat = 98% PA sat = 52%, 54% PaPI = 3.17  After cath doubled torsemide to 40 bid.   Here for routine f/u. Was on torsemide 40 bid and somehow now back on furosemide 20 daily and takes metolazone about once a week. Feeling better. Having good days and bad days. Working FT with networking. Dyspnea and fatigue with mild activity. Edema improved. Trying to ride cardiobike and can go about 5 mins. Edema improved. No dizziness.    Smoked < 1/2 ppd x many years. Quit 1-2 years ago    Past Medical History:  Diagnosis Date  . Allergy   . Aneurysm of ascending aorta (HCC)   . Atrial fibrillation ([place])   . BPH (benign  prostatic hyperplasia)   . Colon polyps   . FHx: rheumatic heart disease   . H/O diplopia   . Hepatitis C   . Hypertension   . Hypogonadism male   . Mitral valve disease     Current Outpatient Medications  Medication Sig Dispense Refill  . amoxicillin (AMOXIL) 500 MG capsule Take 4 capsules by mouth 1 hour prior to dental appointment    . furosemide (LASIX) 20 MG tablet Take 1 tablet (20 mg total) by mouth daily. 90 tablet 3  . IRON PO Take 1 tablet by mouth every other day.    . metolazone (ZAROXOLYN) 2.5 MG tablet Take 1 tablet (2.5 mg total) by mouth daily as needed. 15 tablet 3  . Multiple Vitamin (MULTIVITAMIN WITH MINERALS) TABS tablet Take 1 tablet by mouth daily.    . potassium chloride SA (K-DUR) 20 MEQ tablet Take 2 tablets (40 mEq total) by mouth daily. 60 tablet 3  . sildenafil (VIAGRA) 100 MG tablet Take 100 mg by mouth as needed.    . tadalafil (CIALIS) 5 MG tablet TAKE 1 TABLET BY MOUTH AS NEEDED AS DIRECTED    . testosterone cypionate (DEPOTESTOSTERONE CYPIONATE) 200 MG/ML injection Inject 200 mg into the muscle every Thursday.     . warfarin (COUMADIN) 5 MG tablet Take 5 mg by mouth See admin instructions. Take 5 mg every day except Friday take 7.5 mg    .  zolpidem (AMBIEN) 5 MG tablet Take 1-2 tablets by mouth at bedtime as needed for sleep.      No current facility-administered medications for this encounter.     Allergies  Allergen Reactions  . Penicillins Rash    Did it involve swelling of the face/tongue/throat, SOB, or low BP? No Did it involve sudden or severe rash/hives, skin peeling, or any reaction on the inside of your mouth or nose? No Did you need to seek medical attention at a hospital or doctor's office? No When did it last happen?30 + years If all above answers are "NO", may proceed with cephalosporin use.       Social History   Socioeconomic History  . Marital status: Divorced    Spouse name: Not on file  . Number of children: 2  .  Years of education: Not on file  . Highest education level: Some college, no degree  Occupational History  . Not on file  Social Needs  . Financial resource strain: Not on file  . Food insecurity    Worry: Not on file    Inability: Not on file  . Transportation needs    Medical: Not on file    Non-medical: Not on file  Tobacco Use  . Smoking status: Former Smoker    Types: Cigars    Quit date: 09/03/2017    Years since quitting: 1.4  . Smokeless tobacco: Never Used  Substance and Sexual Activity  . Alcohol use: No  . Drug use: No  . Sexual activity: Not on file  Lifestyle  . Physical activity    Days per week: Not on file    Minutes per session: Not on file  . Stress: Not on file  Relationships  . Social Herbalist on phone: Not on file    Gets together: Not on file    Attends religious service: Not on file    Active member of club or organization: Not on file    Attends meetings of clubs or organizations: Not on file    Relationship status: Not on file  . Intimate partner violence    Fear of current or ex partner: Not on file    Emotionally abused: Not on file    Physically abused: Not on file    Forced sexual activity: Not on file  Other Topics Concern  . Not on file  Social History Narrative   Lives at home alone    Left handed   Caffeine: 1-2 cups daily   Fhx: No FHX of premature CAD or SCD. Personally reviewed Vitals:   02/22/19 0956  BP: 100/60  Pulse: 60  SpO2: 100%  Weight: 64.4 kg (142 lb)    PHYSICAL EXAM: General:  Thin male  No resp difficulty HEENT: normal Neck: supple. JVP 8. Carotids 2+ bilat; no bruits. No lymphadenopathy or thryomegaly appreciated. Cor: PMI nondisplaced. Mildly irregular rate & rhythm. mechanical s1 Lungs: clear Abdomen: soft, nontender, nondistended. No hepatosplenomegaly. No bruits or masses. Good bowel sounds. Extremities: no cyanosis, clubbing, rash, 1-2+ edema L>R Neuro: alert & orientedx3, cranial nerves  grossly intact. moves all 4 extremities w/o difficulty. Affect pleasant   ASSESSMENT & PLAN:  1.  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 RV severely dilated and HK. RVSP 55 - RHC 8/20 with significantly elevated biventricular pressures, moderate to severe mixed PAH and low output (CI 1.96) - He  has improved with more aggressive diuresis - Now NYHA II-III - I am not sure how his diuretic regimen got switched from torsemide 40 bid to furosemide 20 daily + weekly metolazone but it seems to be working.  - Goal weight 136-138 for now. Can take metolazone ever 5 days or so - Check labs today - Advanced options very limited. 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 Not ideal candidate for palliative inotropes given infection risk with mechanical MV - M-spike negative. Check PYP.   2. 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   - mild MS on last echo - on coumadin - aware of SBE prohylaxis  3.  Chronic a flutter  - rate controlled on coumadin. No bleeding  4.  Thoracic aneurysm followed by Dr. Cyndia Bent  - CT chest 6/20 asc ao aorta 4.4cm   - not surgical candidate   Glori Bickers, MD  10:11 AM

## 2019-02-23 ENCOUNTER — Telehealth (HOSPITAL_COMMUNITY): Payer: Self-pay | Admitting: *Deleted

## 2019-02-23 DIAGNOSIS — I5032 Chronic diastolic (congestive) heart failure: Secondary | ICD-10-CM

## 2019-02-23 MED ORDER — FUROSEMIDE 40 MG PO TABS: 40.0000 mg | ORAL_TABLET | Freq: Every day | ORAL | 6 refills | Status: DC

## 2019-02-23 MED ORDER — SPIRONOLACTONE 25 MG PO TABS: 12.5000 mg | ORAL_TABLET | Freq: Every day | ORAL | 6 refills | Status: DC

## 2019-02-23 NOTE — Telephone Encounter (Signed)
Notes recorded by Scarlette Calico, RN on 02/23/2019 at 2:33 PM EST  Spoke w/pt, he is aware, agreeable and verbalizes understanding. Prescriptions for lasix and spiro sent to pharmacy, unfortunately pt is unable to have labs drawn again until 12/3, sch appt for that day and then again on 12/17.

## 2019-02-23 NOTE — Telephone Encounter (Signed)
-----   Message from Jolaine Artist, MD sent at 02/22/2019  2:37 PM EST ----- Increase lasix to 40 daily. Add spiro 12.5. Stop kcl. Repeat BMET in 1 week and 3 weeks.

## 2019-03-05 ENCOUNTER — Other Ambulatory Visit: Payer: Self-pay

## 2019-03-05 ENCOUNTER — Telehealth: Payer: Self-pay | Admitting: Interventional Cardiology

## 2019-03-05 ENCOUNTER — Ambulatory Visit (HOSPITAL_COMMUNITY)
Admission: EM | Admit: 2019-03-05 | Discharge: 2019-03-05 | Disposition: A | Discharge status: Home / Self Care | Payer: 59 | Attending: Emergency Medicine | Admitting: Emergency Medicine

## 2019-03-05 ENCOUNTER — Encounter (HOSPITAL_COMMUNITY): Payer: Self-pay

## 2019-03-05 DIAGNOSIS — R040 Epistaxis: Secondary | ICD-10-CM

## 2019-03-05 MED ORDER — OXYMETAZOLINE HCL 0.05 % NA SOLN
1.0000 | Freq: Two times a day (BID) | NASAL | Status: DC
  Administered 2019-03-05: 1 via NASAL

## 2019-03-05 MED ORDER — OXYMETAZOLINE HCL 0.05 % NA SOLN
NASAL | Status: AC
  Filled 2019-03-05: qty 30

## 2019-03-05 NOTE — ED Notes (Signed)
Pt's nose not bleeding at this time. Small gauze packing placed in right nostril, pt given extra gauze and the rest of the Afrin to take home with him in case of re-bleed. Provider notified, pt requesting to go home.

## 2019-03-05 NOTE — ED Provider Notes (Signed)
[facility]    CSN: [id number] Arrival date & time: 03/05/19  1356      History   Chief Complaint Chief Complaint  Patient presents with  . Epistaxis    HPI [name] is a 68 y.o. male.   The history is provided by the patient. No language interpreter was used.  Epistaxis Location:  R nare Severity:  Moderate Duration:  1 day Timing:  Intermittent Progression:  Unchanged Chronicity:  Recurrent Context: nose picking   Context: not anticoagulants, not foreign body, not hypertension and not recent infection   Relieved by:  Applying pressure Worsened by:  Nothing Ineffective treatments:  None tried Associated symptoms: headaches   Associated symptoms: no congestion, no cough, no fever, no sinus pain and no sore throat     Past Medical History:  Diagnosis Date  . Allergy   . Aneurysm of ascending aorta (HCC)   . Atrial fibrillation ([place])   . BPH (benign prostatic hyperplasia)   . Colon polyps   . FHx: rheumatic heart disease   . H/O diplopia   . Hepatitis C   . Hypertension   . Hypogonadism male   . Mitral valve disease     Patient Active Problem List   Diagnosis Date Noted  . Encounter to establish care 01/25/2019  . Vertigo 06/07/2018  . Acute on chronic diastolic heart failure ([place]) 03/04/2018  . Cirrhosis ([place]) 05/08/2014  . Chronic atrial flutter ([place]) 06/05/2013  . Hepatitis C 06/05/2013  . Chronic diastolic heart failure ([place]) 06/05/2013  . Encounter for therapeutic drug monitoring 04/30/2013  . 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 History of colonic polyps 11/14/2012  . Thoracic aortic aneurysm ([place]) 05/02/2012    Past Surgical History:  Procedure Laterality Date  . COLON SURGERY    . COLONOSCOPY    . COLONOSCOPY N/A 11/14/2012   Procedure: COLONOSCOPY;  Surgeon: Lear Ng, MD;  Location: [facility];  Service: Endoscopy;  Laterality: N/A;  . ESOPHAGOGASTRODUODENOSCOPY (EGD) WITH PROPOFOL N/A  05/08/2014   Procedure: ESOPHAGOGASTRODUODENOSCOPY (EGD) WITH PROPOFOL;  Surgeon: Lear Ng, MD;  Location: [facility];  Service: Endoscopy;  Laterality: N/A;  . MITRAL VALVE REPLACEMENT    . RIGHT HEART CATH N/A 11/24/2018   Procedure: RIGHT HEART CATH;  Surgeon: Jolaine Artist, MD;  Location: [facility];  Service: Cardiovascular;  Laterality: N/A;       Home Medications    Prior to Admission medications   Medication Sig Start Date End Date Taking? Authorizing Provider  warfarin (COUMADIN) 5 MG tablet Take 5 mg by mouth See admin instructions. Take 5 mg every day except Friday take 7.5 mg 07/09/10  Yes [provider]  amoxicillin (AMOXIL) 500 MG capsule Take 4 capsules by mouth 1 hour prior to dental appointment    [provider]  furosemide (LASIX) 40 MG tablet Take 1 tablet (40 mg total) by mouth daily. 02/23/19   Bensimhon, Shaune Pascal, MD  IRON PO Take 1 tablet by mouth every other day. 07/30/10   [provider]  metolazone (ZAROXOLYN) 2.5 MG tablet Take 1 tablet (2.5 mg total) by mouth daily as needed. 11/24/18   Bensimhon, Shaune Pascal, MD  Multiple Vitamin (MULTIVITAMIN WITH MINERALS) TABS tablet Take 1 tablet by mouth daily.    [provider]  sildenafil (VIAGRA) 100 MG tablet Take 100 mg by mouth as needed. 04/03/18   [provider]  spironolactone (ALDACTONE) 25 MG tablet Take  0.5 tablets (12.5 mg total) by mouth daily. 02/23/19   Bensimhon, Shaune Pascal, MD  tadalafil (CIALIS) 5 MG tablet TAKE 1 TABLET BY MOUTH AS NEEDED AS DIRECTED 11/09/18   [provider]  testosterone cypionate (DEPOTESTOSTERONE CYPIONATE) 200 MG/ML injection Inject 200 mg into the muscle every Thursday.  06/08/17   [provider]  zolpidem (AMBIEN) 5 MG tablet Take 1-2 tablets by mouth at bedtime as needed for sleep.  09/15/18   [provider]    Family History Family History  Family history unknown: Yes    Social  History Social History   Tobacco Use  . Smoking status: Former Smoker    Types: Cigars    Quit date: 09/03/2017    Years since quitting: 1.5  . Smokeless tobacco: Never Used  Substance Use Topics  . Alcohol use: No  . Drug use: No     Allergies   Penicillins   Review of Systems Review of Systems  Constitutional: Negative for activity change, chills, fatigue and fever.  HENT: Positive for nosebleeds. Negative for congestion, rhinorrhea, sinus pressure, sinus pain and sore throat.   Respiratory: Negative for cough, chest tightness and shortness of breath.   Cardiovascular: Negative for chest pain.  Neurological: Positive for headaches.  ROS: All other are negatives   Physical Exam Triage Vital Signs ED Triage Vitals  Enc Vitals Group     BP 03/05/19 1408 122/78     Pulse Rate 03/05/19 1408 86     Resp 03/05/19 1408 16     Temp 03/05/19 1408 98.3 F (36.8 C)     Temp Source 03/05/19 1408 Oral     SpO2 03/05/19 1408 100 %     Weight --      Height --      Head Circumference --      Peak Flow --      Pain Score 03/05/19 1407 0     Pain Loc --      Pain Edu? --      Excl. in [place]? --    No data found.  Updated Vital Signs BP 122/78 (BP Location: Left Arm)   Pulse 86   Temp 98.3 F (36.8 C) (Oral)   Resp 16   SpO2 100%   Visual Acuity Right Eye Distance:   Left Eye Distance:   Bilateral Distance:    Right Eye Near:   Left Eye Near:    Bilateral Near:     Physical Exam Vitals signs and nursing note reviewed.  Constitutional:      General: He is not in acute distress.    Appearance: Normal appearance. He is normal weight. He is not toxic-appearing.  HENT:     Head: Normocephalic and atraumatic.     Right Ear: Tympanic membrane, ear canal and external ear normal. There is no impacted cerumen.     Left Ear: Tympanic membrane, ear canal and external ear normal. There is no impacted cerumen.     Nose: No congestion.     Right Nostril: Epistaxis present. No  foreign body.     Mouth/Throat:     Mouth: Mucous membranes are moist.     Pharynx: No oropharyngeal exudate or posterior oropharyngeal erythema.  Cardiovascular:     Rate and Rhythm: Normal rate and regular rhythm.     Pulses: Normal pulses.     Heart sounds: Normal heart sounds. No murmur.  Pulmonary:     Effort: Pulmonary effort is normal. No respiratory  distress.     Breath sounds: Normal breath sounds. No wheezing.  Chest:     Chest wall: No tenderness.  Neurological:     Mental Status: He is alert and oriented to person, place, and time.      UC Treatments / Results  Labs (all labs ordered are listed, but only abnormal results are displayed) Labs Reviewed - No data to display  EKG   Radiology No results found.  Procedures Procedures (including critical care time)  Medications Ordered in UC Medications  oxymetazoline (AFRIN) 0.05 % nasal spray 1 spray (1 spray Each Nare Given 03/05/19 1416)  oxymetazoline (AFRIN) 0.05 % nasal spray (has no administration in time range)    Initial Impression / Assessment and Plan / UC Course  I have reviewed the triage vital signs and the nursing notes.  Pertinent labs & imaging results that were available during my care of the patient were reviewed by me and considered in my medical decision making (see chart for details).   Patient was advised to go to ED if bleeding restart. I recommenced to the patient to go to ED for further evaluation due to abnormal possible PT and INR but he declined  at this time. Final Clinical Impressions(s) / UC Diagnoses   Final diagnoses:  Epistaxis     Discharge Instructions     Patient was advised to go to ED if symptom get worse  Avoid blowing your nose or sniffing for a number of hours. Avoid straining, lifting, or bending at the waist for several days. You may resume other normal activities as you are able. Use saline spray or a humidifier as told by your health care provider. Blood  thinners make bleeding more likely. If you are prescribed these medicines and you suffer from nosebleeds: Ask your cardiologist if you should stop taking the medicines or if you should adjust the dose.  Do not stop taking medicines that your health care provider has recommended unless told by your health care provider.    ED Prescriptions    None     PDMP not reviewed this encounter.   Emerson Monte, [place] 03/05/19 1549

## 2019-03-05 NOTE — ED Notes (Signed)
One more spray of afrin applied up right nostril, new packing inserted.

## 2019-03-05 NOTE — ED Triage Notes (Signed)
Patient presents to Urgent Care with complaints of nosebleed since last night. Patient reports he is on coumadin, states it is still dripping at this time.

## 2019-03-05 NOTE — Telephone Encounter (Signed)
Spoke with pt and advised him of recommendations.  Pt will go to [facility] on [address] for eval.

## 2019-03-05 NOTE — Telephone Encounter (Signed)
Patient called stating he has a very bad nose bleed that started last night. He wants to know if he should still take his medication.

## 2019-03-05 NOTE — Telephone Encounter (Signed)
If patient nose is still bleeding, he should seek medical care at urgent care or ER. The MD who evaluates the bleeding can advise when it is safe to take warfarin.

## 2019-03-05 NOTE — Discharge Instructions (Addendum)
Patient was advised to go to ED if symptom get worse  Avoid blowing your nose or sniffing for a number of hours. Avoid straining, lifting, or bending at the waist for several days. You may resume other normal activities as you are able. Use saline spray or a humidifier as told by your health care provider. Blood thinners make bleeding more likely. If you are prescribed these medicines and you suffer from nosebleeds: Ask your cardiologist if you should stop taking the medicines or if you should adjust the dose.  Do not stop taking medicines that your health care provider has recommended unless told by your health care provider.

## 2019-03-05 NOTE — ED Notes (Signed)
Pt states he tried normal saline nasal spray at home, verbal order received to administer afrin nasal spray. Two sprays of afrin applied up right nostril, gauze packed into nostril, pt applying pressure. Will reassess.

## 2019-03-08 ENCOUNTER — Other Ambulatory Visit: Payer: Self-pay

## 2019-03-08 ENCOUNTER — Ambulatory Visit (HOSPITAL_COMMUNITY)
Admission: RE | Admit: 2019-03-08 | Discharge: 2019-03-08 | Disposition: A | Discharge status: Home / Self Care | Payer: 59 | Source: Ambulatory Visit | Attending: Cardiology | Admitting: Cardiology

## 2019-03-08 ENCOUNTER — Encounter (HOSPITAL_COMMUNITY): Payer: Self-pay | Admitting: *Deleted

## 2019-03-08 DIAGNOSIS — I5032 Chronic diastolic (congestive) heart failure: Secondary | ICD-10-CM

## 2019-03-08 LAB — BASIC METABOLIC PANEL
Anion gap: 6 (ref 5–15)
BUN: 27 mg/dL — ABNORMAL HIGH (ref 8–23)
CO2: 23 mmol/L (ref 22–32)
Calcium: 8.9 mg/dL (ref 8.9–10.3)
Chloride: 107 mmol/L (ref 98–111)
Creatinine, Ser: 2.04 mg/dL — ABNORMAL HIGH (ref 0.61–1.24)
GFR calc Af Amer: 38 mL/min — ABNORMAL LOW (ref >60)
GFR calc non Af Amer: 33 mL/min — ABNORMAL LOW (ref >60)
Glucose, Bld: 123 mg/dL — ABNORMAL HIGH (ref 70–99)
Potassium: 3.9 mmol/L (ref 3.5–5.1)
Sodium: 136 mmol/L (ref 135–145)

## 2019-03-19 ENCOUNTER — Telehealth: Payer: Self-pay | Admitting: Interventional Cardiology

## 2019-03-19 NOTE — Telephone Encounter (Signed)
Detailed message was left for patient on 03/16/19 regarding his disability forms   [phone]

## 2019-03-19 NOTE — Telephone Encounter (Signed)
Papers completed and left for MD to sign

## 2019-03-19 NOTE — Telephone Encounter (Signed)
Pt states he has been trying to return a phone message from the [facility] and cannot get anyone on the phone and has left several messages. He called our office asking for a direct line. I advised him that the [facility] line was probably on the phone when he called and I would forward to Dr. Clayborne Dana nurse to follow up. He agrees with plan  And had no additional questions.

## 2019-03-19 NOTE — Telephone Encounter (Signed)
Patient states he was referred by Dr. Tamala Julian to Dr. Haroldine Laws at the [facility]. He says he can never get in touch with anyone from there. He states he left a message last week and still has not heard anything from them. He was wondering if there was a way to get in touch with a live person instead of a recording so he can speak to someone at that office.

## 2019-03-22 ENCOUNTER — Ambulatory Visit (HOSPITAL_COMMUNITY)
Admission: RE | Admit: 2019-03-22 | Discharge: 2019-03-22 | Disposition: A | Discharge status: Home / Self Care | Payer: 59 | Source: Ambulatory Visit | Attending: Cardiology | Admitting: Cardiology

## 2019-03-22 ENCOUNTER — Other Ambulatory Visit: Payer: Self-pay

## 2019-03-22 DIAGNOSIS — I5032 Chronic diastolic (congestive) heart failure: Secondary | ICD-10-CM | POA: Diagnosis not present

## 2019-03-22 LAB — BASIC METABOLIC PANEL
Anion gap: 8 (ref 5–15)
BUN: 33 mg/dL — ABNORMAL HIGH (ref 8–23)
CO2: 23 mmol/L (ref 22–32)
Calcium: 8.6 mg/dL — ABNORMAL LOW (ref 8.9–10.3)
Chloride: 103 mmol/L (ref 98–111)
Creatinine, Ser: 2.04 mg/dL — ABNORMAL HIGH (ref 0.61–1.24)
GFR calc Af Amer: 38 mL/min — ABNORMAL LOW (ref >60)
GFR calc non Af Amer: 33 mL/min — ABNORMAL LOW (ref >60)
Glucose, Bld: 109 mg/dL — ABNORMAL HIGH (ref 70–99)
Potassium: 3.5 mmol/L (ref 3.5–5.1)
Sodium: 134 mmol/L — ABNORMAL LOW (ref 135–145)

## 2019-03-23 ENCOUNTER — Ambulatory Visit (INDEPENDENT_AMBULATORY_CARE_PROVIDER_SITE_OTHER): Payer: 59 | Admitting: Student in an Organized Health Care Education/Training Program

## 2019-03-23 DIAGNOSIS — Z79899 Other long term (current) drug therapy: Secondary | ICD-10-CM

## 2019-03-23 NOTE — Progress Notes (Signed)
   Subjective:    Patient ID: [name], male    DOB: 1950-12-13, 68 y.o.   MRN: [id number]  CC: medication review  HPI:  Patient has no specific concerns to speak to me about today. He discusses in detail the concerns he has for his cardiology office. He was seen there yesterday for labs and is trying to make an appointment to talk to the provider, hopefully today about his long-term disability paperwork and his water pills. I have reviewed his most recent cardiology note and compared the medication recommendations to our list and discussed with patient to see what he is taking and found there are several discrepancies between the different sources. I have not updated our system to reflect the patient endorsed medication regimen.  -He took amoxicillin recently for dentist appointment and has completed the course without complications.  - taking iron every other day - taking 20mg  Lasix every day and has good urine response. 137.6lbs today and describes SOB which is not worse than his baseline. He feels like he lost too much weight and could not tolerate the amount of urine output when he was on 40mg . He does not take the metolazone. Unable to obtain pulse ox today and had delayed cap refill bilaterally UE. He is taking spironolactone and occasional sildenafil which he reports is not very effective for him. -Gets testosterone injections weekly. - takes warfarin 5mg  daily and 2.5 on thursdays. INR last checked at coumadin clinic 11/19 was 3.3 and no changes to medication regimen at that time. Next appointment is 12/30. Denies abnormal bleeding/bruising.   CKDIII- Patient has not followed up with renal but his labs show a stable kidney function. I recommended he reach out to them for follow up.   Smoking status reviewed   ROS: pertinent noted in the HPI   I have personally reviewed pertinent past medical history, surgical, family, and social history as appropriate. Objective:  BP 100/60    Wt 137 lb 9.6 oz (62.4 kg)   BMI 18.15 kg/m   Vitals and nursing note reviewed  General: NAD, pleasant, thin, able to participate in exam HEENT: dry mucous membranes Cardiac: irregular rate with murmur heard best at RUSB and apex. Delayed cap refill. Respiratory: CTAB, normal effort, No wheezes, rales or rhonchi Extremities: negative for pretibial edema or cyanosis. Skin: warm and dry, no rashes noted Neuro: alert, no obvious focal deficits Psych: Normal affect and mood  Assessment & Plan:   Encounter for medication review Reviewed our list and list from cardiologist in comparison with what patient is taking and found several discrepancies. Reconciled med list and recommended follow up with cardiologist.   Also recommended follow up with nephrology.   Doristine Mango, [facility] PGY-2

## 2019-03-23 NOTE — Assessment & Plan Note (Addendum)
Reviewed our list and list from cardiologist in comparison with what patient is taking and found several discrepancies. Reconciled med list and recommended follow up with cardiologist.

## 2019-03-23 NOTE — Patient Instructions (Signed)
It was a pleasure to see you today!  To summarize our discussion for this visit:  We went over your medications and made some corrections.  I recommend you follow up with your heart doctor about your labs and paperwork. Please let me know if there is anything I can do to help.  Please follow up with your kidney doctor to monitor your kidney function.   Some additional health maintenance measures we should update are: Health Maintenance Due  Topic Date Due  . TETANUS/TDAP  05/30/1971  . PNA vac Low Risk Adult (1 of 2 - PCV13) 05/29/2017  .    Please return to our clinic to see me in about 5-6 months.  Call the clinic at [phone] if your symptoms worsen or you have any concerns.   Thank you for allowing me to take part in your care,  Dr. Doristine Mango

## 2019-04-02 ENCOUNTER — Telehealth (HOSPITAL_COMMUNITY): Payer: Self-pay

## 2019-04-02 ENCOUNTER — Telehealth: Payer: Self-pay | Admitting: Interventional Cardiology

## 2019-04-02 NOTE — Telephone Encounter (Signed)
Disability forms for [employer] faxed to [phone]. Fax confirmation received. Original to be scanned into chart.

## 2019-04-02 NOTE — Telephone Encounter (Signed)
Called and spoke to patient. Patient states that he is eager to get back to work. He states that he was told by his work that the disability forms were not turned in on time and he will need a letter from Dr. Haroldine Laws stating that he can return to work. Made patient aware that It looks like they faxed over the disability forms this afternoon. He states that they were due last Thursday. Patient states that he will still need a note from Dr. Haroldine Laws. Made patient aware that I will forward to Dr. Haroldine Laws and his RN for review.

## 2019-04-02 NOTE — Telephone Encounter (Signed)
New Message  Pt called and stated that he is in a dilemma. Pt wanted to know if it was possible for Dr. Tamala Julian to give him a release to go back to work. Previously asked Dr. Simona Huh office, but they will not return his phone call. Gave them short-term disability paperwork, but they did not fill out paperwork by deadline, therefore request for short-term disability was denied.   Please call to discuss.

## 2019-04-03 ENCOUNTER — Encounter (HOSPITAL_COMMUNITY): Payer: Self-pay

## 2019-04-03 ENCOUNTER — Telehealth (HOSPITAL_COMMUNITY): Payer: Self-pay | Admitting: Vascular Surgery

## 2019-04-03 NOTE — Progress Notes (Signed)
Letter for patient to return to work with restrictions, to be picked up by patients.

## 2019-04-03 NOTE — Telephone Encounter (Signed)
Spoke  to pt gave appt for [facility], pt is aware

## 2019-04-03 NOTE — Telephone Encounter (Signed)
Called and spoke to patient. Pt agreeable to pick up return to work letter along with original disability paperwork. Paperwork and letter in front office for pick up.

## 2019-04-03 NOTE — Telephone Encounter (Signed)
His functional capacity is quite limited but if he thinks he can manage to go back to work, I will let him try with rest breaks as needed.

## 2019-04-11 ENCOUNTER — Ambulatory Visit (INDEPENDENT_AMBULATORY_CARE_PROVIDER_SITE_OTHER): Payer: 59 | Admitting: *Deleted

## 2019-04-11 ENCOUNTER — Other Ambulatory Visit: Payer: Self-pay

## 2019-04-11 DIAGNOSIS — I4892 Unspecified atrial flutter: Secondary | ICD-10-CM | POA: Diagnosis not present

## 2019-04-11 DIAGNOSIS — I059 Rheumatic mitral valve disease, unspecified: Secondary | ICD-10-CM

## 2019-04-11 DIAGNOSIS — Z5181 Encounter for therapeutic drug level monitoring: Secondary | ICD-10-CM | POA: Diagnosis not present

## 2019-04-11 LAB — POCT INR: INR: 3.7 — AB (ref 2.0–3.0)

## 2019-04-11 NOTE — Patient Instructions (Signed)
Description   Hold today and then continue on same dosage 1 tablet daily except for 1/2 tablet on Thursdays. Recheck INR in 4 weeks. Continue drinking 2 Ensures each day.  Please call [phone] if you have any procedures or medication changes.

## 2019-04-16 ENCOUNTER — Other Ambulatory Visit: Payer: Self-pay

## 2019-04-16 ENCOUNTER — Encounter (HOSPITAL_COMMUNITY)
Admission: RE | Admit: 2019-04-16 | Discharge: 2019-04-16 | Disposition: A | Discharge status: Home / Self Care | Payer: 59 | Source: Ambulatory Visit | Attending: Internal Medicine | Admitting: Internal Medicine

## 2019-04-16 DIAGNOSIS — I5032 Chronic diastolic (congestive) heart failure: Secondary | ICD-10-CM | POA: Diagnosis present

## 2019-04-16 MED ORDER — TECHNETIUM TC 99M PYROPHOSPHATE
21.0000 | Freq: Once | INTRAVENOUS | Status: AC | PRN
  Administered 2019-04-16: 21 via INTRAVENOUS
  Filled 2019-04-16: qty 21

## 2019-04-24 ENCOUNTER — Telehealth (HOSPITAL_COMMUNITY): Payer: Self-pay

## 2019-04-24 NOTE — Telephone Encounter (Signed)
Called pt to review results. Pt verbalized understanding. Pt to follow up with Dr. Haroldine Laws.

## 2019-04-24 NOTE — Telephone Encounter (Signed)
-----   Message from Jolaine Artist, MD sent at 04/21/2019 11:34 AM EST ----- Negative for TTR amyloid

## 2019-05-10 ENCOUNTER — Other Ambulatory Visit: Payer: Self-pay

## 2019-05-10 ENCOUNTER — Ambulatory Visit (INDEPENDENT_AMBULATORY_CARE_PROVIDER_SITE_OTHER): Payer: 59 | Admitting: *Deleted

## 2019-05-10 DIAGNOSIS — I4892 Unspecified atrial flutter: Secondary | ICD-10-CM

## 2019-05-10 DIAGNOSIS — I059 Rheumatic mitral valve disease, unspecified: Secondary | ICD-10-CM

## 2019-05-10 DIAGNOSIS — Z5181 Encounter for therapeutic drug level monitoring: Secondary | ICD-10-CM

## 2019-05-10 LAB — POCT INR: INR: 4.4 — AB (ref 2.0–3.0)

## 2019-05-10 NOTE — Patient Instructions (Signed)
Description   Hold today then start taking 1 tablets daily except for 1/2 a tablet on Sundays and Thursdays. Recheck INR in 2 weeks. Continue drinking 2 Ensures each day.  Please call [phone] if you have any procedures or medication changes.

## 2019-05-30 ENCOUNTER — Other Ambulatory Visit: Payer: Self-pay

## 2019-05-30 ENCOUNTER — Ambulatory Visit (INDEPENDENT_AMBULATORY_CARE_PROVIDER_SITE_OTHER): Payer: 59 | Admitting: *Deleted

## 2019-05-30 DIAGNOSIS — I059 Rheumatic mitral valve disease, unspecified: Secondary | ICD-10-CM

## 2019-05-30 DIAGNOSIS — Z5181 Encounter for therapeutic drug level monitoring: Secondary | ICD-10-CM

## 2019-05-30 DIAGNOSIS — I4892 Unspecified atrial flutter: Secondary | ICD-10-CM

## 2019-05-30 LAB — POCT INR: INR: 1.4 — AB (ref 2.0–3.0)

## 2019-05-30 NOTE — Patient Instructions (Addendum)
Description   Today take 1.5 tablets and 1 tablet tomorrow then continue taking 1 tablet daily except for 1/2 tablet on Sundays and Thursdays. Recheck INR in 1 week. Continue drinking 2 Ensures each day.  Please call [phone] if you have any procedures or medication changes.

## 2019-06-07 ENCOUNTER — Other Ambulatory Visit: Payer: Self-pay

## 2019-06-07 ENCOUNTER — Telehealth (HOSPITAL_COMMUNITY): Payer: Self-pay

## 2019-06-07 ENCOUNTER — Ambulatory Visit (INDEPENDENT_AMBULATORY_CARE_PROVIDER_SITE_OTHER): Payer: 59 | Admitting: *Deleted

## 2019-06-07 DIAGNOSIS — I4892 Unspecified atrial flutter: Secondary | ICD-10-CM | POA: Diagnosis not present

## 2019-06-07 DIAGNOSIS — I059 Rheumatic mitral valve disease, unspecified: Secondary | ICD-10-CM

## 2019-06-07 DIAGNOSIS — Z5181 Encounter for therapeutic drug level monitoring: Secondary | ICD-10-CM | POA: Diagnosis not present

## 2019-06-07 LAB — POCT INR: INR: 2.3 (ref 2.0–3.0)

## 2019-06-07 NOTE — Telephone Encounter (Signed)

## 2019-06-07 NOTE — Patient Instructions (Addendum)
Description   Start taking 1 tablet daily except for 1/2 a tablet on Sundays. Recheck INR in 1 week- Sees Dr. Tamala Julian. Continue drinking 2 Ensures each day.  Please call [phone] if you have any procedures or medication changes.

## 2019-06-08 ENCOUNTER — Encounter (HOSPITAL_COMMUNITY): Payer: Self-pay | Admitting: Internal Medicine

## 2019-06-08 ENCOUNTER — Ambulatory Visit (HOSPITAL_COMMUNITY)
Admission: RE | Admit: 2019-06-08 | Discharge: 2019-06-08 | Disposition: A | Discharge status: Home / Self Care | Payer: 59 | Source: Ambulatory Visit | Attending: Internal Medicine | Admitting: Internal Medicine

## 2019-06-08 VITALS — BP 100/50 | HR 70 | Wt 138.2 lb

## 2019-06-08 DIAGNOSIS — Z8619 Personal history of other infectious and parasitic diseases: Secondary | ICD-10-CM | POA: Diagnosis not present

## 2019-06-08 DIAGNOSIS — Z7901 Long term (current) use of anticoagulants: Secondary | ICD-10-CM | POA: Insufficient documentation

## 2019-06-08 DIAGNOSIS — Z880 Allergy status to penicillin: Secondary | ICD-10-CM | POA: Diagnosis not present

## 2019-06-08 DIAGNOSIS — D649 Anemia, unspecified: Secondary | ICD-10-CM | POA: Diagnosis not present

## 2019-06-08 DIAGNOSIS — Z86010 Personal history of colonic polyps: Secondary | ICD-10-CM | POA: Insufficient documentation

## 2019-06-08 DIAGNOSIS — I5032 Chronic diastolic (congestive) heart failure: Secondary | ICD-10-CM

## 2019-06-08 DIAGNOSIS — I4892 Unspecified atrial flutter: Secondary | ICD-10-CM | POA: Diagnosis not present

## 2019-06-08 DIAGNOSIS — N400 Enlarged prostate without lower urinary tract symptoms: Secondary | ICD-10-CM | POA: Insufficient documentation

## 2019-06-08 DIAGNOSIS — I5030 Unspecified diastolic (congestive) heart failure: Secondary | ICD-10-CM | POA: Diagnosis not present

## 2019-06-08 DIAGNOSIS — I050 Rheumatic mitral stenosis: Secondary | ICD-10-CM | POA: Insufficient documentation

## 2019-06-08 DIAGNOSIS — N184 Chronic kidney disease, stage 4 (severe): Secondary | ICD-10-CM | POA: Insufficient documentation

## 2019-06-08 DIAGNOSIS — Z79899 Other long term (current) drug therapy: Secondary | ICD-10-CM | POA: Diagnosis not present

## 2019-06-08 DIAGNOSIS — Z87891 Personal history of nicotine dependence: Secondary | ICD-10-CM | POA: Diagnosis not present

## 2019-06-08 DIAGNOSIS — I2721 Secondary pulmonary arterial hypertension: Secondary | ICD-10-CM | POA: Diagnosis not present

## 2019-06-08 DIAGNOSIS — I272 Pulmonary hypertension, unspecified: Secondary | ICD-10-CM | POA: Insufficient documentation

## 2019-06-08 DIAGNOSIS — Z952 Presence of prosthetic heart valve: Secondary | ICD-10-CM | POA: Diagnosis not present

## 2019-06-08 DIAGNOSIS — E291 Testicular hypofunction: Secondary | ICD-10-CM | POA: Insufficient documentation

## 2019-06-08 DIAGNOSIS — I130 Hypertensive heart and chronic kidney disease with heart failure and stage 1 through stage 4 chronic kidney disease, or unspecified chronic kidney disease: Secondary | ICD-10-CM | POA: Diagnosis not present

## 2019-06-08 DIAGNOSIS — I059 Rheumatic mitral valve disease, unspecified: Secondary | ICD-10-CM | POA: Diagnosis not present

## 2019-06-08 DIAGNOSIS — I712 Thoracic aortic aneurysm, without rupture: Secondary | ICD-10-CM | POA: Insufficient documentation

## 2019-06-08 NOTE — Progress Notes (Signed)
ADVANCED HF CLINIC NOTE   Primary Cardiologist: Dr. Daneen Schick  HPI:  [name] is a 69 y.o. male who is referred by Dr. 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 Other problems include hepatitis C (s/p curative therapy), chronic atrial flutter, ascending aortic aneurysm 4.5 cm, chronic kidney disease stage III,chronic anemia.    CTA of chest 6/20 neg for PE but dilated main pulmonary artery at 4.5 cm. + emphysema. Ectatic ascending aorta 4.4 cm.   Echo 7/20 EF 60-65%, moderately increased LV wall thickness.  Mod LVH. RV with severely reducedsystolic function pressure moderately elevated at 54.9 mmHg mildly dilated aortic root 4.4 cm, mild AI s/p MVR with mean gradient of 5 , severe LAE, severe RV dysfunction mild TR estimated RVSP moderately elevated.    We saw him for the first time on 11/08/18 More SOB, more LE edema. NYHA IIIB symptoms. Underwent RHC to assess need for inotropes  RHC on 11/24/18  RA = 17 RV = 71/17 PA = 71/27 (45) PCW = 27 (v = 39) Fick cardiac output/index = 3.7/1.96 PVR = 5.0 WU Ao sat = 98% PA sat = 52%, 54% PaPI = 3.17  After cath doubled torsemide to 40 bid.   Here for routine f/u. Now taking furosemide 40 daily and metolazone about once a week when needed. Goal weight has been 136-138 which he has managed well. Has good days and bad days. Now back to work as Insurance underwriter. Getting ready to retire in 10 weeks. Gets winded with 2 flights of stairs or more. No edema, orthopnea or PND.  Taking Ensure.    Smoked < 1/2 ppd x many years. Quit 1-2 years ago    Past Medical History:  Diagnosis Date  . Allergy   . Aneurysm of ascending aorta (HCC)   . Atrial fibrillation ([place])   . BPH (benign prostatic hyperplasia)   . Colon polyps    . FHx: rheumatic heart disease   . H/O diplopia   . Hepatitis C   . Hypertension   . Hypogonadism male   . Mitral valve disease     Current Outpatient Medications  Medication Sig Dispense Refill  . amoxicillin (AMOXIL) 500 MG capsule Take 4 capsules by mouth 1 hour prior to dental appointment    . furosemide (LASIX) 40 MG tablet Take 1 tablet (40 mg total) by mouth daily. (Patient taking differently: Take 20 mg by mouth daily. ) 30 tablet 6  . IRON PO Take 1 tablet by mouth every other day.    . metolazone (ZAROXOLYN) 2.5 MG tablet Take 1 tablet (2.5 mg total) by mouth daily as needed. 15 tablet 3  . Multiple Vitamin (MULTIVITAMIN WITH MINERALS) TABS tablet Take 1 tablet by mouth daily.    . sildenafil (VIAGRA) 100 MG tablet Take 100 mg by mouth as needed.    Marland Kitchen spironolactone (ALDACTONE) 25 MG tablet Take 0.5 tablets (12.5 mg total) by mouth daily. 15 tablet 6  . tadalafil (CIALIS) 5 MG tablet TAKE 1 TABLET BY MOUTH AS NEEDED AS DIRECTED    . testosterone cypionate (DEPOTESTOSTERONE CYPIONATE) 200 MG/ML injection Inject 3 mg into the muscle every Thursday.     . warfarin (COUMADIN) 5 MG tablet Take 5 mg by mouth See admin instructions. Take 5 mg every  day except Friday take 7.5 mg    . zolpidem (AMBIEN) 5 MG tablet Take 1-2 tablets by mouth at bedtime as needed for sleep.      No current facility-administered medications for this encounter.    Allergies  Allergen Reactions  . Penicillins Rash    Did it involve swelling of the face/tongue/throat, SOB, or low BP? No Did it involve sudden or severe rash/hives, skin peeling, or any reaction on the inside of your mouth or nose? No Did you need to seek medical attention at a hospital or doctor's office? No When did it last happen?30 + years If all above answers are "NO", may proceed with cephalosporin use.       Social History   Socioeconomic History  . Marital status: Divorced    Spouse name: Not on file  . Number of  children: 2  . Years of education: Not on file  . Highest education level: Some college, no degree  Occupational History  . Not on file  Tobacco Use  . Smoking status: Former Smoker    Types: Cigars    Quit date: 09/03/2017    Years since quitting: 1.7  . Smokeless tobacco: Never Used  Substance and Sexual Activity  . Alcohol use: No  . Drug use: No  . Sexual activity: Not on file  Other Topics Concern  . Not on file  Social History Narrative   Lives at home alone    Left handed   Caffeine: 1-2 cups daily   Social Determinants of Health   Financial Resource Strain:   . Difficulty of Paying Living Expenses: Not on file  Food Insecurity:   . Worried About Charity fundraiser in the Last Year: Not on file  . Ran Out of Food in the Last Year: Not on file  Transportation Needs:   . Lack of Transportation (Medical): Not on file  . Lack of Transportation (Non-Medical): Not on file  Physical Activity:   . Days of Exercise per Week: Not on file  . Minutes of Exercise per Session: Not on file  Stress:   . Feeling of Stress : Not on file  Social Connections:   . Frequency of Communication with Friends and Family: Not on file  . Frequency of Social Gatherings with Friends and Family: Not on file  . Attends Religious Services: Not on file  . Active Member of Clubs or Organizations: Not on file  . Attends Archivist Meetings: Not on file  . Marital Status: Not on file  Intimate Partner Violence:   . Fear of Current or Ex-Partner: Not on file  . Emotionally Abused: Not on file  . Physically Abused: Not on file  . Sexually Abused: Not on file   Fhx: No FHX of premature CAD or SCD. Personally reviewed Vitals:   06/08/19 0926  BP: (!) 100/50  Pulse: 70  SpO2: 100%  Weight: 62.7 kg (138 lb 3.2 oz)    PHYSICAL EXAM: General:  Thin male  No resp difficulty HEENT: normal Neck: supple. no JVD. Carotids 2+ bilat; no bruits. No lymphadenopathy or thryomegaly appreciated.  Cor: PMI nondisplaced. Irregular rate & rhythm. Mechanical s1. Loud s2. 2/6 TR Lungs: clear Abdomen: soft, nontender, nondistended. No hepatosplenomegaly. No bruits or masses. Good bowel sounds. Extremities: no cyanosis, clubbing, rash, tr-1+ ankle edema Neuro: alert & orientedx3, cranial nerves grossly intact. moves all 4 extremities w/o difficulty. Affect pleasant  ECG: Atrial flutter 67 with LVH and PVCs Personally reviewed  ASSESSMENT & PLAN:  1.  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 RV severely dilated and HK. RVSP 55 - RHC 8/20 with significantly elevated biventricular pressures, moderate to severe mixed PAH and low output (CI 1.96) - Stable NYHA II-early III - Volume status well maintained with use of sliding scale regimen.  - Goal weight 136-138 for now. Continue to take metolazone once per week or so as needed - Check labs with Dr. Tamala Julian next week (offered to do them here but he will wait until next week) - Advanced options very limited. 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 Not ideal candidate for palliative inotropes given infection risk with mechanical MV - M-spike negative. PYP 1/21 negative for amyloid   2. 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   - mild MS on last echo - on coumadin - stable - aware of SBE prophylaxis   3.  Chronic a flutter  - rate controlled on coumadin. No bleeding - ECG stable   4.  Thoracic aneurysm followed by Dr. Cyndia Bent  - CT chest 6/20 asc ao aorta 4.4cm   - not surgical candidate  RTC in 6 months   Glori Bickers, MD  9:59 AM

## 2019-06-08 NOTE — Patient Instructions (Signed)
Please follow up with the [facility] in 6 months.  At the [facility], you and your health needs are our priority. As part of our continuing mission to provide you with exceptional heart care, we have created designated Provider Care Teams. These Care Teams include your primary Cardiologist (physician) and Advanced Practice Providers (APPs- Physician Assistants and Nurse Practitioners) who all work together to provide you with the care you need, when you need it.   You may see any of the following providers on your designated Care Team at your next follow up: Marland Kitchen Dr Glori Bickers . Dr Loralie Champagne . Darrick Grinder, NP . Lyda Jester, PA . Audry Riles, PharmD   Please be sure to bring in all your medications bottles to every appointment.

## 2019-06-14 NOTE — Progress Notes (Signed)
Cardiology Office Note:    Date:  06/15/2019   ID:  [name], DOB 1951-01-05, MRN [id number]  PCP:  Leighton Ruff, MD  Cardiologist:  Sinclair Grooms, MD   Referring MD: [name], DO   Chief Complaint  Patient presents with  . Cardiac Valve Problem    History of Present Illness:    [name] is a 69 y.o. 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 Other problems include hepatitis C, chronic atrial flutter, ascending aortic aneurysm 4.5 cm, chronic kidney disease stage III,chronic anemiaand chronic anticoagulation.  Had difficulty at the advanced heart failure clinic where papers for long-term disability were not filed causing him to miss a months Salary.  Subsequently had an amyloid scan done at the beginning of the year leading to payment out-of-pocket because his deductible had not been missed.  The amyloid scan was to have been scheduled prior to the end of 2020 but was not scheduled until after the new calendar year.  He is in a financial bind now related to loss of both income from his job and out-of-pocket cost from the amyloid scan which was negative.  Otherwise he is doing better.  His basic therapy is 3 diuretics which includes daily furosemide, spironolactone, and as needed metolazone.  Has had 1 near syncopal episode upon standing quickly several months ago but otherwise is doing better.  Lower extremity swelling is better.  He denies significant orthopnea but still has to sleep on his right side.  He has not had significant palpitations.  No blood in his urine or stool.  Past Medical History:  Diagnosis Date  . Allergy   . Aneurysm of ascending aorta (HCC)   . Atrial fibrillation ([place])   . BPH (benign prostatic hyperplasia)   . Colon polyps   . FHx: rheumatic heart  disease   . H/O diplopia   . Hepatitis C   . Hypertension   . Hypogonadism male   . Mitral valve disease     Past Surgical History:  Procedure Laterality Date  . COLON SURGERY    . COLONOSCOPY    . COLONOSCOPY N/A 11/14/2012   Procedure: COLONOSCOPY;  Surgeon: Lear Ng, MD;  Location: [facility];  Service: Endoscopy;  Laterality: N/A;  . ESOPHAGOGASTRODUODENOSCOPY (EGD) WITH PROPOFOL N/A 05/08/2014   Procedure: ESOPHAGOGASTRODUODENOSCOPY (EGD) WITH PROPOFOL;  Surgeon: Lear Ng, MD;  Location: [facility];  Service: Endoscopy;  Laterality: N/A;  . MITRAL VALVE REPLACEMENT    . RIGHT HEART CATH N/A 11/24/2018   Procedure: RIGHT HEART CATH;  Surgeon: Jolaine Artist, MD;  Location: [facility];  Service: Cardiovascular;  Laterality: N/A;    Current Medications: Current Meds  Medication Sig  . amoxicillin (AMOXIL) 500 MG capsule Take 4 capsules by mouth 1 hour prior to dental appointment  . furosemide (LASIX) 40 MG tablet Take 1 tablet (40 mg total) by mouth daily.  . IRON PO Take 1 tablet by mouth every other day.  . metolazone (ZAROXOLYN) 2.5 MG tablet Take 1 tablet (2.5 mg total) by mouth daily as needed.  . Multiple Vitamin (MULTIVITAMIN WITH MINERALS) TABS tablet Take 1 tablet by mouth daily.  . sildenafil (VIAGRA) 100 MG tablet Take 100 mg by mouth as needed.  Marland Kitchen spironolactone (ALDACTONE) 25 MG tablet Take 0.5 tablets (12.5  mg total) by mouth daily.  . tadalafil (CIALIS) 5 MG tablet TAKE 1 TABLET BY MOUTH AS NEEDED AS DIRECTED  . testosterone cypionate (DEPOTESTOSTERONE CYPIONATE) 200 MG/ML injection Inject 3 mg into the muscle every Thursday.   . warfarin (COUMADIN) 5 MG tablet Take 5 mg by mouth See admin instructions. Take 5 mg every day except Friday take 7.5 mg  . zolpidem (AMBIEN) 5 MG tablet Take 1-2 tablets by mouth at bedtime as needed for sleep.      Allergies:   Penicillins   Social History   Socioeconomic History  . Marital  status: Divorced    Spouse name: Not on file  . Number of children: 2  . Years of education: Not on file  . Highest education level: Some college, no degree  Occupational History  . Not on file  Tobacco Use  . Smoking status: Former Smoker    Types: Cigars    Quit date: 09/03/2017    Years since quitting: 1.7  . Smokeless tobacco: Never Used  Substance and Sexual Activity  . Alcohol use: No  . Drug use: No  . Sexual activity: Not on file  Other Topics Concern  . Not on file  Social History Narrative   Lives at home alone    Left handed   Caffeine: 1-2 cups daily   Social Determinants of Health   Financial Resource Strain:   . Difficulty of Paying Living Expenses:   Food Insecurity:   . Worried About Charity fundraiser in the Last Year:   . Arboriculturist in the Last Year:   Transportation Needs:   . Film/video editor (Medical):   Marland Kitchen Lack of Transportation (Non-Medical):   Physical Activity:   . Days of Exercise per Week:   . Minutes of Exercise per Session:   Stress:   . Feeling of Stress :   Social Connections:   . Frequency of Communication with Friends and Family:   . Frequency of Social Gatherings with Friends and Family:   . Attends Religious Services:   . Active Member of Clubs or Organizations:   . Attends Archivist Meetings:   Marland Kitchen Marital Status:      Family History: The patient's Family history is unknown by patient.  ROS:   Please see the history of present illness.    Poor appetite.  Difficulty maintaining his weight.  Will retire in May 2021.  He has not been vaccinated.  He wants to get the single shot [employer] vaccine.  No chills, fever, blood in the urine or stool.  All other systems reviewed and are negative.  EKGs/Labs/Other Studies Reviewed:    The following studies were reviewed today: Nuclear amyloid scan January 2021: Most recently performed June 08, 2019 demonstrating atrial flutter, occasional PVC,  controlled rate, prominent voltage compatible with LVH.  TECHNETIUM PYROPHOSPHATE AMYLOID SCAN, January 2021 IMPRESSION: Visual and quantitative assessment (grade 1, H/CLL equal 0.94) are not suggestive of transthyretin amyloidosis.  EKG:  EKG not repeated.  Recent Labs: 12/22/2018: ALT 44; Hemoglobin 10.5; Platelets 96 02/22/2019: B Natriuretic Peptide 633.8 03/22/2019: BUN 33; Creatinine, Ser 2.04; Potassium 3.5; Sodium 134  Recent Lipid Panel No results found for: CHOL, TRIG, HDL, CHOLHDL, VLDL, LDLCALC, LDLDIRECT  Physical Exam:    VS:  BP (!) 98/58   Pulse 81   Ht 6\' 1"  (1.854 m)   Wt 139 lb 3.2 oz (63.1 kg)   SpO2 97%   BMI 18.37  kg/m     Wt Readings from Last 3 Encounters:  06/15/19 139 lb 3.2 oz (63.1 kg)  06/08/19 138 lb 3.2 oz (62.7 kg)  03/23/19 137 lb 9.6 oz (62.4 kg)     GEN: Cachectic appearing although not different than prior. No acute distress HEENT: Normal NECK: Marked CV with elevated JVD. LYMPHATICS: No lymphadenopathy CARDIAC: Irregular irregular RR with 2/6 left mid sternal border systolic murmur, soft S3 gallop, bilateral 2+ generalized lower extremity edema. VASCULAR:  Normal Pulses. No bruits. RESPIRATORY:  Clear to auscultation without rales, wheezing or rhonchi  ABDOMEN: Soft, non-tender, non-distended, No pulsatile mass, MUSCULOSKELETAL: No deformity  SKIN: Warm and dry NEUROLOGIC:  Alert and oriented x 3 PSYCHIATRIC:  Normal affect   ASSESSMENT:    1. Chronic diastolic congestive heart failure ([place])   2. Cor pulmonale, chronic ([place])   3. Financial difficulties   4. Chronic atrial flutter (HCC)   5. H/O mitral valve replacement   6. Thoracic aortic aneurysm without rupture ([place])   7. Anticoagulation goal of INR 2.5 to 3.5   8. Stage 3 chronic kidney disease, unspecified whether stage 3a or 3b CKD   9. Cachexia ([place])   10. Cirrhosis of liver with ascites, unspecified hepatic cirrhosis type ([place])   11. Educated about COVID-19 virus  infection    PLAN:    In order of problems listed above:  1. Compensated with marginal excess volume but with no room for further diuresis due to relatively low pressures. 2. Current therapy is diuretics.  Cautioned him to be careful with standing quickly and if dizzy did not walk off without having ability to hold on to something. 3. Long-term disability papers were filed late resulting in loss of 1 month of salary.  He is now back at work.  Technetium pyrophosphate scan scheduled for 2020 was not performed until 2021 after the patient's deductible was reset for the new year costing him $2000 out of pocket.  He is very disturbed by this set of circumstances which arose from the advanced heart failure clinic.  Will forward this note to Dr. Haroldine Laws. 4. No change 5. Auscultatory exam is unchanged from typical. 6. Followed by [facility]. 7. No recent bleeding. 8. Not assessed 9. Related to liver disease and heart failure. 10. No jaundice or other change in status. 11. Encouraged to get vaccinated because he is at increased for poor outcome if infected.  Continue to practice social distancing.  He will be retiring in May and hopefully will have less stress.  Encouraged him to continue with physical activity trying to achieve at least 150 minutes of moderate activity each week.   Medication Adjustments/Labs and Tests Ordered: Current medicines are reviewed at length with the patient today.  Concerns regarding medicines are outlined above.  No orders of the defined types were placed in this encounter.  No orders of the defined types were placed in this encounter.   Patient Instructions  Medication Instructions:  Your physician recommends that you continue on your current medications as directed. Please refer to the Current Medication list given to you today.  *If you need a refill on your cardiac medications before your next appointment, please call your pharmacy*   Lab Work: None If you  have labs (blood work) drawn today and your tests are completely normal, you will receive your results only by: Marland Kitchen MyChart Message (if you have MyChart) OR . A paper copy in the mail If you have any lab test that  is abnormal or we need to change your treatment, we will call you to review the results.   Testing/Procedures: None   Follow-Up: At [facility], you and your health needs are our priority.  As part of our continuing mission to provide you with exceptional heart care, we have created designated Provider Care Teams.  These Care Teams include your primary Cardiologist (physician) and Advanced Practice Providers (APPs -  Physician Assistants and Nurse Practitioners) who all work together to provide you with the care you need, when you need it.  We recommend signing up for the patient portal called "MyChart".  Sign up information is provided on this After Visit Summary.  MyChart is used to connect with patients for Virtual Visits (Telemedicine).  Patients are able to view lab/test results, encounter notes, upcoming appointments, etc.  Non-urgent messages can be sent to your provider as well.   To learn more about what you can do with MyChart, go to [email or web address].    Your next appointment:   6-8 month(s)  The format for your next appointment:   In Person  Provider:   You may see Sinclair Grooms, MD or one of the following Advanced Practice Providers on your designated Care Team:    Truitt Merle, NP  Cecilie Kicks, NP  Kathyrn Drown, NP    Other Instructions      Signed, Sinclair Grooms, MD  06/15/2019 10:59 AM    [place]

## 2019-06-15 ENCOUNTER — Ambulatory Visit (INDEPENDENT_AMBULATORY_CARE_PROVIDER_SITE_OTHER): Payer: 59

## 2019-06-15 ENCOUNTER — Encounter: Payer: Self-pay | Admitting: Interventional Cardiology

## 2019-06-15 ENCOUNTER — Other Ambulatory Visit: Payer: Self-pay

## 2019-06-15 ENCOUNTER — Ambulatory Visit (INDEPENDENT_AMBULATORY_CARE_PROVIDER_SITE_OTHER): Payer: 59 | Admitting: Interventional Cardiology

## 2019-06-15 VITALS — BP 98/58 | HR 81 | Ht 73.0 in | Wt 139.2 lb

## 2019-06-15 DIAGNOSIS — R64 Cachexia: Secondary | ICD-10-CM

## 2019-06-15 DIAGNOSIS — K7460 Unspecified cirrhosis of liver: Secondary | ICD-10-CM

## 2019-06-15 DIAGNOSIS — I712 Thoracic aortic aneurysm, without rupture, unspecified: Secondary | ICD-10-CM

## 2019-06-15 DIAGNOSIS — I4892 Unspecified atrial flutter: Secondary | ICD-10-CM | POA: Diagnosis not present

## 2019-06-15 DIAGNOSIS — R188 Other ascites: Secondary | ICD-10-CM

## 2019-06-15 DIAGNOSIS — I059 Rheumatic mitral valve disease, unspecified: Secondary | ICD-10-CM | POA: Diagnosis not present

## 2019-06-15 DIAGNOSIS — Z5181 Encounter for therapeutic drug level monitoring: Secondary | ICD-10-CM | POA: Diagnosis not present

## 2019-06-15 DIAGNOSIS — Z599 Problem related to housing and economic circumstances, unspecified: Secondary | ICD-10-CM

## 2019-06-15 DIAGNOSIS — I2781 Cor pulmonale (chronic): Secondary | ICD-10-CM | POA: Diagnosis not present

## 2019-06-15 DIAGNOSIS — Z598 Other problems related to housing and economic circumstances: Secondary | ICD-10-CM

## 2019-06-15 DIAGNOSIS — N183 Chronic kidney disease, stage 3 unspecified: Secondary | ICD-10-CM

## 2019-06-15 DIAGNOSIS — Z7189 Other specified counseling: Secondary | ICD-10-CM

## 2019-06-15 DIAGNOSIS — I5032 Chronic diastolic (congestive) heart failure: Secondary | ICD-10-CM

## 2019-06-15 DIAGNOSIS — Z952 Presence of prosthetic heart valve: Secondary | ICD-10-CM

## 2019-06-15 DIAGNOSIS — Z7901 Long term (current) use of anticoagulants: Secondary | ICD-10-CM

## 2019-06-15 LAB — POCT INR: INR: 2.9 (ref 2.0–3.0)

## 2019-06-15 NOTE — Patient Instructions (Signed)
Medication Instructions:  Your physician recommends that you continue on your current medications as directed. Please refer to the Current Medication list given to you today.  *If you need a refill on your cardiac medications before your next appointment, please call your pharmacy*   Lab Work: None If you have labs (blood work) drawn today and your tests are completely normal, you will receive your results only by: . MyChart Message (if you have MyChart) OR . A paper copy in the mail If you have any lab test that is abnormal or we need to change your treatment, we will call you to review the results.   Testing/Procedures: None   Follow-Up: At [facility], you and your health needs are our priority.  As part of our continuing mission to provide you with exceptional heart care, we have created designated Provider Care Teams.  These Care Teams include your primary Cardiologist (physician) and Advanced Practice Providers (APPs -  Physician Assistants and Nurse Practitioners) who all work together to provide you with the care you need, when you need it.  We recommend signing up for the patient portal called "MyChart".  Sign up information is provided on this After Visit Summary.  MyChart is used to connect with patients for Virtual Visits (Telemedicine).  Patients are able to view lab/test results, encounter notes, upcoming appointments, etc.  Non-urgent messages can be sent to your provider as well.   To learn more about what you can do with MyChart, go to [email or web address].    Your next appointment:   6-8 month(s)  The format for your next appointment:   In Person  Provider:   You may see Henry W Smith III, MD or one of the following Advanced Practice Providers on your designated Care Team:    Lori Gerhardt, NP  Laura Ingold, NP  Jill McDaniel, NP    Other Instructions   

## 2019-06-15 NOTE — Patient Instructions (Signed)
Description   Continue on same dosage 1 tablet daily except for 1/2 a tablet on Sundays. Recheck INR in 3 weeks. Continue drinking 2 Ensures each day.  Please call [phone] if you have any procedures or medication changes.

## 2019-07-06 ENCOUNTER — Ambulatory Visit (INDEPENDENT_AMBULATORY_CARE_PROVIDER_SITE_OTHER): Payer: 59 | Admitting: Pharmacist

## 2019-07-06 ENCOUNTER — Other Ambulatory Visit: Payer: Self-pay

## 2019-07-06 DIAGNOSIS — I4892 Unspecified atrial flutter: Secondary | ICD-10-CM

## 2019-07-06 DIAGNOSIS — I059 Rheumatic mitral valve disease, unspecified: Secondary | ICD-10-CM

## 2019-07-06 DIAGNOSIS — Z5181 Encounter for therapeutic drug level monitoring: Secondary | ICD-10-CM | POA: Diagnosis not present

## 2019-07-06 LAB — POCT INR: INR: 3 (ref 2.0–3.0)

## 2019-07-06 NOTE — Patient Instructions (Signed)
Continue on same dosage 1 tablet daily except for 1/2 a tablet on Sundays. Recheck INR in 4 weeks. Continue drinking 2 Ensures each day.  Please call [phone] if you have any procedures or medication changes.

## 2019-07-17 ENCOUNTER — Telehealth: Payer: Self-pay

## 2019-07-17 NOTE — Telephone Encounter (Signed)
[facility] received 54 pages of medical records from [facility]. Forwarding via interoffice mail to [facility] 07/17/19  [facility]

## 2019-07-20 ENCOUNTER — Telehealth: Payer: Self-pay | Admitting: Gastroenterology

## 2019-07-20 NOTE — Telephone Encounter (Signed)
DOD 07/18/19  Dr. Havery Moros, this pt had his records transferred from [facility].  Pt stated that he is due for a colonoscopy.  He requested [facility] becacuse all of his MDs are under [facility].  Please review and advise scheduling.

## 2019-07-25 ENCOUNTER — Encounter: Payer: Self-pay | Admitting: Gastroenterology

## 2019-08-07 ENCOUNTER — Other Ambulatory Visit: Payer: Self-pay

## 2019-08-07 ENCOUNTER — Ambulatory Visit (INDEPENDENT_AMBULATORY_CARE_PROVIDER_SITE_OTHER): Payer: 59 | Admitting: *Deleted

## 2019-08-07 DIAGNOSIS — I4892 Unspecified atrial flutter: Secondary | ICD-10-CM

## 2019-08-07 DIAGNOSIS — Z5181 Encounter for therapeutic drug level monitoring: Secondary | ICD-10-CM

## 2019-08-07 DIAGNOSIS — I059 Rheumatic mitral valve disease, unspecified: Secondary | ICD-10-CM | POA: Diagnosis not present

## 2019-08-07 LAB — POCT INR: INR: 2.9 (ref 2.0–3.0)

## 2019-08-07 NOTE — Patient Instructions (Signed)
Description   Continue taking 1 tablet daily except for 1/2 tablet on Sundays. Recheck INR in 6 weeks. Continue drinking 2 Ensures each day.  Please call [phone] if you have any procedures or medication changes.

## 2019-08-20 ENCOUNTER — Other Ambulatory Visit: Payer: Self-pay | Admitting: Student in an Organized Health Care Education/Training Program

## 2019-08-28 ENCOUNTER — Other Ambulatory Visit (INDEPENDENT_AMBULATORY_CARE_PROVIDER_SITE_OTHER): Payer: Medicare Other

## 2019-08-28 ENCOUNTER — Telehealth: Payer: Self-pay

## 2019-08-28 ENCOUNTER — Encounter: Payer: Self-pay | Admitting: Gastroenterology

## 2019-08-28 ENCOUNTER — Ambulatory Visit (INDEPENDENT_AMBULATORY_CARE_PROVIDER_SITE_OTHER): Payer: Medicare Other | Admitting: Gastroenterology

## 2019-08-28 VITALS — BP 106/62 | HR 78 | Ht 73.0 in | Wt 137.0 lb

## 2019-08-28 DIAGNOSIS — Z86010 Personal history of colonic polyps: Secondary | ICD-10-CM | POA: Diagnosis not present

## 2019-08-28 DIAGNOSIS — K7460 Unspecified cirrhosis of liver: Secondary | ICD-10-CM

## 2019-08-28 DIAGNOSIS — I50810 Right heart failure, unspecified: Secondary | ICD-10-CM | POA: Diagnosis not present

## 2019-08-28 DIAGNOSIS — Z7901 Long term (current) use of anticoagulants: Secondary | ICD-10-CM

## 2019-08-28 LAB — CBC WITH DIFFERENTIAL/PLATELET
Basophils Absolute: 0.1 10*3/uL (ref 0.0–0.1)
Basophils Relative: 1.2 % (ref 0.0–3.0)
Eosinophils Absolute: 0.3 10*3/uL (ref 0.0–0.7)
Eosinophils Relative: 6.3 % — ABNORMAL HIGH (ref 0.0–5.0)
HCT: 29.3 % — ABNORMAL LOW (ref 39.0–52.0)
Hemoglobin: 9.9 g/dL — ABNORMAL LOW (ref 13.0–17.0)
Lymphocytes Relative: 11.6 % — ABNORMAL LOW (ref 12.0–46.0)
Lymphs Abs: 0.6 10*3/uL — ABNORMAL LOW (ref 0.7–4.0)
MCHC: 33.8 g/dL (ref 30.0–36.0)
MCV: 100.4 fl — ABNORMAL HIGH (ref 78.0–100.0)
Monocytes Absolute: 0.6 10*3/uL (ref 0.1–1.0)
Monocytes Relative: 11.7 % (ref 3.0–12.0)
Neutro Abs: 3.5 10*3/uL (ref 1.4–7.7)
Neutrophils Relative %: 69.2 % (ref 43.0–77.0)
Platelets: 145 10*3/uL — ABNORMAL LOW (ref 150.0–400.0)
RBC: 2.92 Mil/uL — ABNORMAL LOW (ref 4.22–5.81)
RDW: 17.8 % — ABNORMAL HIGH (ref 11.5–15.5)
WBC: 5 10*3/uL (ref 4.0–10.5)

## 2019-08-28 LAB — HEPATIC FUNCTION PANEL
ALT: 34 U/L (ref 0–53)
AST: 116 U/L — ABNORMAL HIGH (ref 0–37)
Albumin: 4.1 g/dL (ref 3.5–5.2)
Alkaline Phosphatase: 77 U/L (ref 39–117)
Bilirubin, Direct: 0.8 mg/dL — ABNORMAL HIGH (ref 0.0–0.3)
Total Bilirubin: 2.9 mg/dL — ABNORMAL HIGH (ref 0.2–1.2)
Total Protein: 8.7 g/dL — ABNORMAL HIGH (ref 6.0–8.3)

## 2019-08-28 MED ORDER — SUTAB 1479-225-188 MG PO TABS: 1.0000 | ORAL_TABLET | Freq: Once | ORAL | 0 refills | Status: AC

## 2019-08-28 MED ORDER — POLYETHYLENE GLYCOL 3350 17 G PO PACK: 17.0000 g | PACK | Freq: Every day | ORAL | 0 refills | Status: AC | PRN

## 2019-08-28 NOTE — Telephone Encounter (Signed)
[facility] Pre-operative Risk Assessment     Request for surgical clearance:     Endoscopy Procedure  What type of surgery is being performed?     Colonoscopy and EGD  When is this surgery scheduled?     11-19-19  What type of clearance is required ?   Pharmacy  Are there any medications that need to be held prior to surgery and how long? Coumadin 5 days  Practice name and name of physician performing surgery?   Dr. [place] Cellar,  [facility]  What is your office phone and fax number?      Phone- [phone]  Fax- [phone] Attn: Lemar Lofty, CMA  Anesthesia type (None, local, MAC, general) ?       MAC  Thank you.

## 2019-08-28 NOTE — Patient Instructions (Signed)
If you are age 67 or older, your body mass index should be between 23-30. Your Body mass index is 18.07 kg/m. If this is out of the aforementioned range listed, please consider follow up with your Primary Care Provider.  If you are age 65 or younger, your body mass index should be between 19-25. Your Body mass index is 18.07 kg/m. If this is out of the aformentioned range listed, please consider follow up with your Primary Care Provider.    Your provider has requested that you go to the basement level for lab work before leaving today. Press "B" on the elevator. The lab is located at the first door on the left as you exit the elevator.  Due to recent changes in healthcare laws, you may see the results of your imaging and laboratory studies on MyChart before your provider has had a chance to review them.  We understand that in some cases there may be results that are confusing or concerning to you. Not all laboratory results come back in the same time frame and the provider may be waiting for multiple results in order to interpret others.  Please give [place] 48 hours in order for your provider to thoroughly review all the results before contacting the office for clarification of your results.    You have been scheduled for an abdominal ultrasound at [facility] (1st floor of hospital) on 09-13-19 at 9:00am. Please arrive 15 minutes prior to your appointment for registration. Make certain not to have anything to eat or drink 6 hours prior to your appointment. Should you need to reschedule your appointment, please contact radiology at [phone]. This test typically takes about 30 minutes to perform.   You have been scheduled for a colonoscopy. Please follow written instructions given to you at your visit today.  Please pick up your prep supplies at the pharmacy within the next 1-3 days. If you use inhalers (even only as needed), please bring them with you on the day of your procedure.  You  will be contacted by our office prior to your procedure for directions on holding your COUMADIN.  If you do not hear from our office 1 week prior to your scheduled procedure, please call [phone] to discuss.   Please take Miralax as directed, as needed.  Thank you for entrusting me with your care and for choosing [facility], Dr. Bedias Cellar

## 2019-08-28 NOTE — Progress Notes (Signed)
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  The patient has a history of advanced adenoma in 2014 at the time of his last colonoscopy he had a 1.4 cm adenoma removed.  He has some occasional constipation but usually has 1 bowel movement per day at baseline without difficulty.  Otherwise no blood in his stools or problems with his bowels for the most part.  He denies any abdominal pains that bother him.  He has a history of right ventricular heart failure with pulmonary hypertension, last evaluated with cardiac cath in 2020.  He does have some dyspnea on exertion, some lower extremity edema managed with Lasix.  He takes chronic Coumadin for his mitral valve.  He denies any family history of colon cancer.  He otherwise has a reported history of cirrhosis related to chronic hepatitis C.  He previously had treatment for hepatitis C with Harvoni at [facility] which was eradicated.  He has had compensated disease from what I can gather.  He has no history of ascites, no history of jaundice, no history of hepatic encephalopathy, no history of esophageal varices.  His last endoscopy was in 2016.  He denies any alcohol use.  His last ultrasound was March 2020.  He is not seeing GI for at least a year or so, previously followed by Dr. Michail Sermon of [facility].  Last platelet count 96 as of September 2020.  LAE's at that time showed bili at 2.7, AST 140, ALT normal, alk phos normal.  He has chronic kidney disease with creatinine around to baseline.  Colonoscopy 11/14/2012 - 2 polyps removed, largest 1.4cm  EGD 05/08/2014 - no varices, antral gastritis  Echocardiogram 10/13/18 - EF 60-65%, severely reduced RV function  Cardiac cath 11/24/18 - moderate pulm HTN, "moderate to severely reduced cardiac outout"  [place] 06/13/18 -  gallstones, ? Cirrhosis [place] 03/25/17 - gallstones, suggested cirrhosis  Lab Results  Component Value Date   WBC 3.8 (L) 12/22/2018   HGB 10.5 (L) 12/22/2018   HCT 32.3 (L) 12/22/2018   MCV 106.3 (H) 12/22/2018   PLT 96 (L) 12/22/2018     Past Medical History:  Diagnosis Date  . Allergy   . Aneurysm of ascending aorta (HCC)   . Atrial fibrillation ([place])   . BPH (benign prostatic hyperplasia)   . Colon polyps   . FHx: rheumatic heart disease   . H/O diplopia   . Hepatitis C   . Hypertension   . Hypogonadism male   . Mitral valve disease    mitral valve repair/replacement x3     Past Surgical History:  Procedure Laterality Date  . COLON SURGERY    . COLONOSCOPY    . COLONOSCOPY N/A 11/14/2012   Procedure: COLONOSCOPY;  Surgeon: Lear Ng, MD;  Location: [facility];  Service: Endoscopy;  Laterality: N/A;  . ESOPHAGOGASTRODUODENOSCOPY (EGD) WITH PROPOFOL N/A 05/08/2014   Procedure: ESOPHAGOGASTRODUODENOSCOPY (EGD) WITH PROPOFOL;  Surgeon: Lear Ng, MD;  Location: [place];  Service: Endoscopy;  Laterality: N/A;  . MITRAL VALVE REPLACEMENT     1973 (bioprosthesis), 1988 (St Jude mechanical vlave) , 2006 (repair due to leak)  . RIGHT HEART CATH N/A 11/24/2018   Procedure: RIGHT HEART CATH;  Surgeon: Jolaine Artist, MD;  Location: [facility];  Service: Cardiovascular;  Laterality:  N/A;  . UPPER GASTROINTESTINAL ENDOSCOPY     02-2010 and 05-2014   Family History  Family history unknown: Yes   Social History   Tobacco Use  . Smoking status: Former Smoker    Types: Cigars    Quit date: 09/03/2017    Years since quitting: 1.9  . Smokeless tobacco: Never Used  Substance Use Topics  . Alcohol use: No  . Drug use: No   Current Outpatient Medications  Medication Sig Dispense Refill  . amoxicillin (AMOXIL) 500 MG capsule Take 4 capsules by mouth 1 hour prior to dental appointment    . furosemide (LASIX) 40 MG tablet Take 1 tablet (40 mg  total) by mouth daily. 30 tablet 6  . IRON PO Take 1 tablet by mouth every other day.    . metolazone (ZAROXOLYN) 2.5 MG tablet Take 1 tablet (2.5 mg total) by mouth daily as needed. 15 tablet 3  . Multiple Vitamin (MULTIVITAMIN WITH MINERALS) TABS tablet Take 1 tablet by mouth daily.    . sildenafil (VIAGRA) 100 MG tablet Take 100 mg by mouth as needed.    Marland Kitchen spironolactone (ALDACTONE) 25 MG tablet Take 0.5 tablets (12.5 mg total) by mouth daily. 15 tablet 6  . tadalafil (CIALIS) 5 MG tablet TAKE 1 TABLET BY MOUTH AS NEEDED AS DIRECTED    . testosterone cypionate (DEPOTESTOSTERONE CYPIONATE) 200 MG/ML injection Inject 3 mg into the muscle every Thursday.     . warfarin (COUMADIN) 5 MG tablet TAKE 1 TABLET DAILY BY MOUTH FOR 90 DAYS 90 tablet 0  . zolpidem (AMBIEN) 5 MG tablet Take 1-2 tablets by mouth at bedtime as needed for sleep.      No current facility-administered medications for this visit.   Allergies  Allergen Reactions  . Penicillins Rash    Did it involve swelling of the face/tongue/throat, SOB, or low BP? No Did it involve sudden or severe rash/hives, skin peeling, or any reaction on the inside of your mouth or nose? No Did you need to seek medical attention at a hospital or doctor's office? No When did it last happen?30 + years If all above answers are "NO", may proceed with cephalosporin use.      Review of Systems: All systems reviewed and negative except where noted in HPI.    Labs per HPI  Physical Exam: BP 106/62   Pulse 78   Ht '6\' 1"'  (1.854 m)   Wt 137 lb (62.1 kg)   BMI 18.07 kg/m  Constitutional: Pleasant,well-developed, male in no acute distress. HEENT: Normocephalic and atraumatic. Conjunctivae are normal.  Neck supple.  Cardiovascular: Normal rate, regular rhythm. Mechanical heart sounds Pulmonary/chest: Effort normal and breath sounds normal.  Abdominal: Soft, nondistended, nontender.  There are no masses palpable. Extremities: trace edema  Lymphadenopathy: No cervical adenopathy noted. Neurological: Alert and oriented to person place and time. Skin: Skin is warm and dry. No rashes noted. Psychiatric: Normal mood and affect. Behavior is normal.   ASSESSMENT AND PLAN: 69 year old male here for new patient visit for the following:  Cirrhosis - as above related to hepatitis C which has been eradicated, he has been compensated from what I can gather in speaking him and review of the chart.  I discussed cirrhosis with him and risks for decompensation moving forward.  He is not drinking any alcohol.  I am recommending baseline CBC and LFTs at this time, his INR is not reliable due to Coumadin use.  He is due for [facility] screening with  an ultrasound and AFP.  We discussed needing to have this evaluated every 6 months given his higher risk for [facility].  He is in agreement with all of this today.  He is due for EGD for variceal screening.  We discussed risks and benefits of endoscopy and anesthesia with him.  Given his right heart failure and pulmonary hypertension I think his case would be best done at the hospital for anesthesia support.  This will tentatively be planned to be done in the next few months.  He will contact me in the interim with any changes in his status.  He reports stable cardiopulmonary symptoms at baseline.  History of colon polyps / anticoagulated / CHF - he is overdue for surveillance colonoscopy.  I discussed as above that given his cardiac history his case should be done at the hospital for anesthesia support.  Further in order to perform colonoscopy and remove polyps he would need to hold Coumadin and will likely need a Lovenox bridge in order to do this safely.  We will reach out to his cardiologist to get their recommendations regarding this.  We will plan on doing this again as above in the next few months.  Further recommendations pending the results.  I asked him to use some MiraLAX as needed for constipation in the interim,  especially prior to bowel prep  [place] Cellar, MD [facility]  CC: Anderson, Chelsey L, DO

## 2019-08-29 ENCOUNTER — Other Ambulatory Visit: Payer: Self-pay

## 2019-08-29 DIAGNOSIS — R7989 Other specified abnormal findings of blood chemistry: Secondary | ICD-10-CM

## 2019-08-29 DIAGNOSIS — Z7901 Long term (current) use of anticoagulants: Secondary | ICD-10-CM

## 2019-08-29 DIAGNOSIS — D649 Anemia, unspecified: Secondary | ICD-10-CM

## 2019-08-29 DIAGNOSIS — K7460 Unspecified cirrhosis of liver: Secondary | ICD-10-CM

## 2019-08-29 DIAGNOSIS — R6889 Other general symptoms and signs: Secondary | ICD-10-CM

## 2019-08-29 LAB — AFP TUMOR MARKER: AFP-Tumor Marker: 1.3 ng/mL (ref <6.1)

## 2019-08-29 NOTE — Telephone Encounter (Signed)
Called and LM for pt to call back to discuss holding warfarin starting on 8-11 for 8-16 procedure. Cardiology will be contacting the pt to arrange for needed Lovenox bridge prior to procedure.

## 2019-08-29 NOTE — Telephone Encounter (Signed)
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    Procedure: Colonoscopy and EGD Date of procedure: 11/19/19  CrCl 30.86 ml/min Platelet count 145  Per office protocol, patient can hold warfarin for 5 days prior to procedure.    Patient WILL need bridging with Lovenox (enoxaparin) around procedure.  We will coordinate bridge in coumadin clinic

## 2019-08-29 NOTE — Telephone Encounter (Signed)
Patient called back.  He expressed understanding to hold coumadin starting on 11-14-19. He knows he will need a lovenox bridge and this will be coordinated through the Coumadin clinic at [facility].

## 2019-08-29 NOTE — Telephone Encounter (Signed)
   Primary Cardiologist: Sinclair Grooms, MD  Chart reviewed as part of pre-operative protocol coverage. Given past medical history and time since last visit, based on ACC/AHA guidelines, GERONIMO DILIBERTO would be at acceptable risk for the planned procedure without further cardiovascular testing.   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    Procedure: Colonoscopy and EGD Date of procedure: 11/19/19  CrCl 30.86 ml/min Platelet count 145  Per office protocol, patient can hold warfarin for 5 days prior to procedure.    Patient WILL need bridging with Lovenox (enoxaparin) around procedure.  We will coordinate bridge in coumadin clinic  I will route this recommendation to the requesting party via [place] fax function and remove from pre-op pool.  Please call with questions.  Jossie Ng. Kace Hartje NP-C    08/29/2019, 10:45 AM [address] Office [phone] Fax [phone]

## 2019-09-06 ENCOUNTER — Other Ambulatory Visit: Payer: Self-pay

## 2019-09-06 ENCOUNTER — Telehealth: Payer: Self-pay | Admitting: Gastroenterology

## 2019-09-06 MED ORDER — AMOXICILLIN 500 MG PO CAPS: ORAL_CAPSULE | ORAL | 0 refills | Status: DC

## 2019-09-06 NOTE — Telephone Encounter (Signed)
Ok to fill 

## 2019-09-06 NOTE — Telephone Encounter (Signed)
Called and spoke to pharmacy.  They had NOT run the codes provided with the script. They will run the codes and inform the patient. Should be $40.

## 2019-09-06 NOTE — Telephone Encounter (Signed)
Patient states insurance is not covering prep medication and it is expensive asking for an alternative

## 2019-09-13 ENCOUNTER — Ambulatory Visit (HOSPITAL_COMMUNITY): Payer: Medicare Other

## 2019-09-18 ENCOUNTER — Other Ambulatory Visit (HOSPITAL_COMMUNITY): Payer: Self-pay | Admitting: Internal Medicine

## 2019-09-18 MED ORDER — SPIRONOLACTONE 25 MG PO TABS: 12.5000 mg | ORAL_TABLET | Freq: Every day | ORAL | 3 refills | Status: DC

## 2019-09-18 MED ORDER — FUROSEMIDE 40 MG PO TABS: 40.0000 mg | ORAL_TABLET | Freq: Every day | ORAL | 4 refills | Status: DC

## 2019-09-20 ENCOUNTER — Other Ambulatory Visit: Payer: Self-pay

## 2019-09-20 ENCOUNTER — Ambulatory Visit (HOSPITAL_COMMUNITY)
Admission: RE | Admit: 2019-09-20 | Discharge: 2019-09-20 | Disposition: A | Discharge status: Home / Self Care | Payer: Medicare Other | Source: Ambulatory Visit | Attending: Gastroenterology | Admitting: Gastroenterology

## 2019-09-20 DIAGNOSIS — K7460 Unspecified cirrhosis of liver: Secondary | ICD-10-CM | POA: Diagnosis present

## 2019-09-21 ENCOUNTER — Ambulatory Visit: Payer: Medicare Other | Admitting: *Deleted

## 2019-09-21 DIAGNOSIS — Z5181 Encounter for therapeutic drug level monitoring: Secondary | ICD-10-CM | POA: Diagnosis not present

## 2019-09-21 DIAGNOSIS — I4892 Unspecified atrial flutter: Secondary | ICD-10-CM

## 2019-09-21 DIAGNOSIS — I059 Rheumatic mitral valve disease, unspecified: Secondary | ICD-10-CM | POA: Diagnosis not present

## 2019-09-21 LAB — POCT INR: INR: 4.3 — AB (ref 2.0–3.0)

## 2019-09-21 NOTE — Patient Instructions (Signed)
Description   Hold today, then continue taking 1 tablet daily except for 1/2 tablet on Sundays. Recheck INR in 3 weeks. Continue drinking 2 Ensures each day.  Please call [phone] if you have any procedures or medication changes.

## 2019-09-27 ENCOUNTER — Other Ambulatory Visit (INDEPENDENT_AMBULATORY_CARE_PROVIDER_SITE_OTHER): Payer: Medicare Other

## 2019-09-27 DIAGNOSIS — K7460 Unspecified cirrhosis of liver: Secondary | ICD-10-CM | POA: Diagnosis not present

## 2019-09-27 DIAGNOSIS — R945 Abnormal results of liver function studies: Secondary | ICD-10-CM

## 2019-09-27 DIAGNOSIS — Z7901 Long term (current) use of anticoagulants: Secondary | ICD-10-CM

## 2019-09-27 DIAGNOSIS — D649 Anemia, unspecified: Secondary | ICD-10-CM | POA: Diagnosis not present

## 2019-09-27 DIAGNOSIS — R7989 Other specified abnormal findings of blood chemistry: Secondary | ICD-10-CM

## 2019-09-27 DIAGNOSIS — R6889 Other general symptoms and signs: Secondary | ICD-10-CM

## 2019-09-27 LAB — TSH: TSH: 2.06 u[IU]/mL (ref 0.35–4.50)

## 2019-09-27 LAB — IBC + FERRITIN
Ferritin: 91.2 ng/mL (ref 22.0–322.0)
Iron: 106 ug/dL (ref 42–165)
Saturation Ratios: 33.8 % (ref 20.0–50.0)
Transferrin: 224 mg/dL (ref 212.0–360.0)

## 2019-09-27 LAB — FOLATE: Folate: >24.8 ng/mL (ref >5.9)

## 2019-09-27 LAB — VITAMIN B12: Vitamin B-12: 1105 pg/mL — ABNORMAL HIGH (ref 211–911)

## 2019-09-28 ENCOUNTER — Telehealth (HOSPITAL_COMMUNITY): Payer: Self-pay | Admitting: Cardiology

## 2019-09-28 NOTE — Telephone Encounter (Signed)
Patient called to report BLE Weight stable 130-137 +SOB  Compliant with sodium intake and water consumption  Uses compression stockings daily   Patient has not taken metolazone in some time Advised he does have an order for as needed metolazone Can take one today and repeat Saturday if needed Return call on Monday with update if needed  Patient would still like message to be sent to provider for long term plan regarding LE edema and SOB, will further medication adjustments be needed.

## 2019-09-29 LAB — HAPTOGLOBIN: Haptoglobin: <8 mg/dL — ABNORMAL LOW (ref 43–212)

## 2019-09-30 NOTE — Telephone Encounter (Signed)
Agree. Let's follow up with him on Monday

## 2019-10-01 ENCOUNTER — Other Ambulatory Visit: Payer: Self-pay

## 2019-10-01 ENCOUNTER — Telehealth: Payer: Self-pay | Admitting: Interventional Cardiology

## 2019-10-01 DIAGNOSIS — D649 Anemia, unspecified: Secondary | ICD-10-CM

## 2019-10-01 DIAGNOSIS — Z7901 Long term (current) use of anticoagulants: Secondary | ICD-10-CM

## 2019-10-01 DIAGNOSIS — K7460 Unspecified cirrhosis of liver: Secondary | ICD-10-CM

## 2019-10-01 NOTE — Telephone Encounter (Signed)
Spoke with pt and scheduled him to be seen in the office on Wednesday.  Pt appreciative for call.

## 2019-10-01 NOTE — Telephone Encounter (Signed)
[name] is calling stating his gastrologist requested he call and schedule an appointment with Dr. Tamala Julian due to his test results at his appointment. I advised [name] our first available at the [facility] office isn't til August and he requested a send a message to see if he can be worked in. Please advise.

## 2019-10-02 NOTE — Telephone Encounter (Signed)
Returned call to patient for follow up Patient reports swelling has improved SOB is better however its chronic Weight down 4 lbs Reports he took metolazone x doses  Follow up with Dr Tamala Julian 6/30 and we scheduled 6 month follow up while on the phone. Advised to return call to office if anything further is needed.  Patient is appreciative of return call

## 2019-10-03 ENCOUNTER — Other Ambulatory Visit: Payer: Self-pay

## 2019-10-03 ENCOUNTER — Encounter: Payer: Self-pay | Admitting: Interventional Cardiology

## 2019-10-03 ENCOUNTER — Ambulatory Visit: Payer: Medicare Other | Admitting: Interventional Cardiology

## 2019-10-03 VITALS — BP 112/50 | HR 81 | Ht 73.0 in | Wt 132.6 lb

## 2019-10-03 DIAGNOSIS — I10 Essential (primary) hypertension: Secondary | ICD-10-CM | POA: Diagnosis not present

## 2019-10-03 DIAGNOSIS — I2729 Other secondary pulmonary hypertension: Secondary | ICD-10-CM | POA: Diagnosis not present

## 2019-10-03 DIAGNOSIS — I059 Rheumatic mitral valve disease, unspecified: Secondary | ICD-10-CM

## 2019-10-03 DIAGNOSIS — I5032 Chronic diastolic (congestive) heart failure: Secondary | ICD-10-CM | POA: Diagnosis not present

## 2019-10-03 DIAGNOSIS — I4892 Unspecified atrial flutter: Secondary | ICD-10-CM

## 2019-10-03 DIAGNOSIS — R188 Other ascites: Secondary | ICD-10-CM

## 2019-10-03 DIAGNOSIS — K7460 Unspecified cirrhosis of liver: Secondary | ICD-10-CM

## 2019-10-03 NOTE — H&P (View-Only) (Signed)
Cardiology Office Note:    Date:  10/03/2019   ID:  [name], DOB [birth year], MRN [id number]  PCP:  [name], DO  Cardiologist:  Sinclair Grooms, MD   Referring MD: [name], DO   Chief Complaint  Patient presents with  . Congestive Heart Failure    History of Present Illness:    [name] is a 69 y.o. 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 Other problems include hepatitis C, chronic atrial flutter, ascending aortic aneurysm 4.5 cm, chronic kidney disease stage III,chronic anemiaand chronic anticoagulation.  [name] is cachectic appearing.  Says he has no appetite.  Occasionally food does not taste right.  He is not having chest pain.  He has not had syncope.  He retired from his job approximately a month ago.  He is having difficulties adjusting.  Is having difficulty sleeping.  He visited the [place] this past weekend.  He was extremely fatigued and had a hard time getting around.  I reviewed notes from the [facility].  Appears options for management are limited.  Past Medical History:  Diagnosis Date  . Allergy   . Aneurysm of ascending aorta (HCC)   . Atrial fibrillation ([place])   . BPH (benign prostatic hyperplasia)   . Colon polyps   . FHx: rheumatic heart disease   . H/O diplopia   . Hepatitis C   . Hypertension   . Hypogonadism male   . Mitral valve disease    mitral valve repair/replacement x3    Past Surgical History:  Procedure Laterality Date  . COLON SURGERY    . COLONOSCOPY    . COLONOSCOPY N/A 11/14/2012   Procedure: COLONOSCOPY;  Surgeon: Lear Ng, MD;  [facility];  Service: Endoscopy;  Laterality: N/A;  . ESOPHAGOGASTRODUODENOSCOPY (EGD) WITH PROPOFOL N/A 05/08/2014   Procedure:  ESOPHAGOGASTRODUODENOSCOPY (EGD) WITH PROPOFOL;  Surgeon: Lear Ng, MD;  Location: [facility];  Service: Endoscopy;  Laterality: N/A;  . MITRAL VALVE REPLACEMENT     1973 (bioprosthesis), 1988 (St Jude mechanical vlave) , 2006 (repair due to leak)  . RIGHT HEART CATH N/A 11/24/2018   Procedure: RIGHT HEART CATH;  Surgeon: Jolaine Artist, MD;  Location: [facility];  Service: Cardiovascular;  Laterality: N/A;  . UPPER GASTROINTESTINAL ENDOSCOPY     02-2010 and 05-2014    Current Medications: Current Meds  Medication Sig  . amoxicillin (AMOXIL) 500 MG capsule Take 4 capsules by mouth 1 hour prior to dental appointment  . furosemide (LASIX) 40 MG tablet Take 1 tablet (40 mg total) by mouth daily.  . IRON PO Take 1 tablet by mouth every other day.  . metolazone (ZAROXOLYN) 2.5 MG tablet Take 1 tablet (2.5 mg total) by mouth daily as needed.  . Multiple Vitamin (MULTIVITAMIN WITH MINERALS) TABS tablet Take 1 tablet by mouth daily.  . polyethylene glycol (MIRALAX) 17 g packet Take 17 g by mouth daily as needed.  . sildenafil (VIAGRA) 100 MG tablet Take 100 mg by mouth as needed.  Marland Kitchen spironolactone (ALDACTONE) 25 MG tablet Take 0.5 tablets (12.5 mg total) by mouth daily.  . tadalafil (CIALIS) 5 MG tablet TAKE 1 TABLET BY MOUTH AS NEEDED AS DIRECTED  . testosterone cypionate (DEPOTESTOSTERONE CYPIONATE) 200 MG/ML injection Inject 3 mg into the  muscle every Thursday.   . warfarin (COUMADIN) 5 MG tablet TAKE 1 TABLET DAILY BY MOUTH FOR 90 DAYS  . zolpidem (AMBIEN) 5 MG tablet Take 1-2 tablets by mouth at bedtime as needed for sleep.      Allergies:   Penicillins   Social History   Socioeconomic History  . Marital status: Divorced    Spouse name: Not on file  . Number of children: 2  . Years of education: Not on file  . Highest education level: Some college, no degree  Occupational History  . Not on file  Tobacco Use  . Smoking status: Former Smoker    Types: Cigars     Quit date: 09/03/2017    Years since quitting: 2.0  . Smokeless tobacco: Never Used  Vaping Use  . Vaping Use: Never used  Substance and Sexual Activity  . Alcohol use: No  . Drug use: No  . Sexual activity: Not on file  Other Topics Concern  . Not on file  Social History Narrative   Lives at home alone    Left handed   Caffeine: 1-2 cups daily   Social Determinants of Health   Financial Resource Strain:   . Difficulty of Paying Living Expenses:   Food Insecurity:   . Worried About Charity fundraiser in the Last Year:   . Arboriculturist in the Last Year:   Transportation Needs:   . Film/video editor (Medical):   Marland Kitchen Lack of Transportation (Non-Medical):   Physical Activity:   . Days of Exercise per Week:   . Minutes of Exercise per Session:   Stress:   . Feeling of Stress :   Social Connections:   . Frequency of Communication with Friends and Family:   . Frequency of Social Gatherings with Friends and Family:   . Attends Religious Services:   . Active Member of Clubs or Organizations:   . Attends Archivist Meetings:   Marland Kitchen Marital Status:      Family History: The patient's Family history is unknown by patient.  ROS:   Please see the history of present illness.    Losing weight.  Now down to 132 pounds.  Anorexia all other systems reviewed and are negative.  EKGs/Labs/Other Studies Reviewed:    The following studies were reviewed today: New data.  Reviewed results from right heart cath performed by Dr Haroldine Laws.  Also reviewed most recent echocardiogram from last July.  Echocardiogram 2020: IMPRESSIONS    1. The left ventricle has normal systolic function with an ejection  fraction of 60-65%. The cavity size was normal. There is moderately  increased left ventricular wall thickness. Left ventricular diastolic  function could not be evaluated secondary to  atrial fibrillation.  2. The right ventricle has severely reduced systolic function.  The cavity  was normal. Right ventricular systolic pressure is moderately elevated  with an estimated pressure of 54.9 mmHg.  3. Left atrial size was severely dilated.  4. The tricuspid valve is grossly normal.  5. The aortic valve is tricuspid. Mild thickening of the aortic valve.  Aortic valve regurgitation is mild by color flow Doppler. No stenosis of  the aortic valve.  6. There is mild dilatation of the aortic root and of the ascending aorta  measuring 44 mm.  7. Normal LV systolic function; moderate LVH; mildly dilated aortic root  (4.4 cm); mild AI; s/p MVR with mean gradient of 5 mmHg; severe LAE;  severe RV dysfunction;  mild TR; estimated RVSP moderately elevated.   EKG:  EKG did not repeat the EKG  Recent Labs: 02/22/2019: B Natriuretic Peptide 633.8 03/22/2019: BUN 33; Creatinine, Ser 2.04; Potassium 3.5; Sodium 134 08/28/2019: ALT 34; Hemoglobin 9.9; Platelets 145.0 Repeated and verified X2. 09/27/2019: TSH 2.06  Recent Lipid Panel No results found for: CHOL, TRIG, HDL, CHOLHDL, VLDL, LDLCALC, LDLDIRECT  Physical Exam:    VS:  BP (!) 112/50   Pulse 81   Ht 6\' 1"  (1.854 m)   Wt 132 lb 9.6 oz (60.1 kg)   SpO2 91%   BMI 17.49 kg/m     Wt Readings from Last 3 Encounters:  10/03/19 132 lb 9.6 oz (60.1 kg)  08/28/19 137 lb (62.1 kg)  06/15/19 139 lb 3.2 oz (63.1 kg)     GEN: Cachectic/wasted appearing. No acute distress HEENT: Normal NECK: Marked JVD to the angle of the jaw predominantly related to V wave.Marland Kitchen LYMPHATICS: No lymphadenopathy CARDIAC: Irregularly irregular RR without murmur, gallop, with trace bilateral ankle edema. VASCULAR:  Normal Pulses. No bruits. RESPIRATORY:  Clear to auscultation without rales, wheezing or rhonchi  ABDOMEN: Soft, non-tender, non-distended, No pulsatile mass, MUSCULOSKELETAL: No deformity  SKIN: Warm and dry NEUROLOGIC:  Alert and oriented x 3 PSYCHIATRIC:  Normal affect   ASSESSMENT:    1. Chronic atrial flutter  (HCC)   2. Chronic diastolic congestive heart failure ([place])   3. Other secondary pulmonary hypertension ([place])   4. Essential hypertension   5. Mitral valve disorder   6. Cirrhosis of liver with ascites, unspecified hepatic cirrhosis type ([place])    PLAN:    In order of problems listed above:  1. EKG not performed.  Presumed still present.  Continue anticoagulation therapy. 2. Volume status is stable.  Lungs are clear and there is trace bilateral lower extremity edema on combination of Aldactone, furosemide, and weekly metolazone. 3. The patient has severe pulmonary hypertension with significant right ventricular systolic dysfunction which limits his prognosis and options for therapy.  I asked if he understood his prognosis and he seemed to feel that he had other options available in the future.  I asked that he discuss this with the advanced heart failure team to have a realistic idea of future.  O2 saturation is 91%. 4. Blood pressure is stable.   5. Mechanical mitral valve.  Continue anticoagulation. 6. We did not discuss.  Still contributing to generalized condition of weakness.  Appetite may be related more to liver than to her heart.  Heart is contributing to liver dysfunction as well. 24-month follow-up  Medication Adjustments/Labs and Tests Ordered: Current medicines are reviewed at length with the patient today.  Concerns regarding medicines are outlined above.  No orders of the defined types were placed in this encounter.  No orders of the defined types were placed in this encounter.   Patient Instructions  Medication Instructions:  Your physician recommends that you continue on your current medications as directed. Please refer to the Current Medication list given to you today.  *If you need a refill on your cardiac medications before your next appointment, please call your pharmacy*   Lab Work: None If you have labs (blood work) drawn today and your tests are completely  normal, you will receive your results only by: Marland Kitchen MyChart Message (if you have MyChart) OR . A paper copy in the mail If you have any lab test that is abnormal or we need to change your treatment, we will call you to  review the results.   Testing/Procedures: None   Follow-Up: At [facility], you and your health needs are our priority.  As part of our continuing mission to provide you with exceptional heart care, we have created designated Provider Care Teams.  These Care Teams include your primary Cardiologist (physician) and Advanced Practice Providers (APPs -  Physician Assistants and Nurse Practitioners) who all work together to provide you with the care you need, when you need it.  We recommend signing up for the patient portal called "MyChart".  Sign up information is provided on this After Visit Summary.  MyChart is used to connect with patients for Virtual Visits (Telemedicine).  Patients are able to view lab/test results, encounter notes, upcoming appointments, etc.  Non-urgent messages can be sent to your provider as well.   To learn more about what you can do with MyChart, go to [email or web address].    Your next appointment:   6 month(s)  The format for your next appointment:   In Person  Provider:   You may see Sinclair Grooms, MD or one of the following Advanced Practice Providers on your designated Care Team:    Truitt Merle, NP  Cecilie Kicks, NP  Kathyrn Drown, NP    Other Instructions      Signed, Sinclair Grooms, MD  10/03/2019 2:35 PM    [place]

## 2019-10-03 NOTE — Patient Instructions (Signed)

## 2019-10-03 NOTE — Progress Notes (Signed)
Cardiology Office Note:    Date:  10/03/2019   ID:  [name], DOB 03-02-1951, MRN [id number]  PCP:  [name], DO  Cardiologist:  Sinclair Grooms, MD   Referring MD: [name], DO   Chief Complaint  Patient presents with  . Congestive Heart Failure    History of Present Illness:    [name] is a 69 y.o. 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 Other problems include hepatitis C, chronic atrial flutter, ascending aortic aneurysm 4.5 cm, chronic kidney disease stage III,chronic anemiaand chronic anticoagulation.  [name] is cachectic appearing.  Says he has no appetite.  Occasionally food does not taste right.  He is not having chest pain.  He has not had syncope.  He retired from his job approximately a month ago.  He is having difficulties adjusting.  Is having difficulty sleeping.  He visited the [place] this past weekend.  He was extremely fatigued and had a hard time getting around.  I reviewed notes from the advanced [facility] Failure Clinic.  Appears options for management are limited.  Past Medical History:  Diagnosis Date  . Allergy   . Aneurysm of ascending aorta (HCC)   . Atrial fibrillation ([place])   . BPH (benign prostatic hyperplasia)   . Colon polyps   . FHx: rheumatic heart disease   . H/O diplopia   . Hepatitis C   . Hypertension   . Hypogonadism male   . Mitral valve disease    mitral valve repair/replacement x3    Past Surgical History:  Procedure Laterality Date  . COLON SURGERY    . COLONOSCOPY    . COLONOSCOPY N/A 11/14/2012   Procedure: COLONOSCOPY;  Surgeon: Lear Ng, MD;  [facility];  Service: Endoscopy;  Laterality: N/A;  . ESOPHAGOGASTRODUODENOSCOPY (EGD) WITH PROPOFOL N/A 05/08/2014   Procedure:  ESOPHAGOGASTRODUODENOSCOPY (EGD) WITH PROPOFOL;  Surgeon: Lear Ng, MD;  Location: [facility];  Service: Endoscopy;  Laterality: N/A;  . MITRAL VALVE REPLACEMENT     1973 (bioprosthesis), 1988 (St Jude mechanical vlave) , 2006 (repair due to leak)  . RIGHT HEART CATH N/A 11/24/2018   Procedure: RIGHT HEART CATH;  Surgeon: Jolaine Artist, MD;  Location: [facility];  Service: Cardiovascular;  Laterality: N/A;  . UPPER GASTROINTESTINAL ENDOSCOPY     02-2010 and 05-2014    Current Medications: Current Meds  Medication Sig  . amoxicillin (AMOXIL) 500 MG capsule Take 4 capsules by mouth 1 hour prior to dental appointment  . furosemide (LASIX) 40 MG tablet Take 1 tablet (40 mg total) by mouth daily.  . IRON PO Take 1 tablet by mouth every other day.  . metolazone (ZAROXOLYN) 2.5 MG tablet Take 1 tablet (2.5 mg total) by mouth daily as needed.  . Multiple Vitamin (MULTIVITAMIN WITH MINERALS) TABS tablet Take 1 tablet by mouth daily.  . polyethylene glycol (MIRALAX) 17 g packet Take 17 g by mouth daily as needed.  . sildenafil (VIAGRA) 100 MG tablet Take 100 mg by mouth as needed.  Marland Kitchen spironolactone (ALDACTONE) 25 MG tablet Take 0.5 tablets (12.5 mg total) by mouth daily.  . tadalafil (CIALIS) 5 MG tablet TAKE 1 TABLET BY MOUTH AS NEEDED AS DIRECTED  . testosterone cypionate (DEPOTESTOSTERONE CYPIONATE) 200 MG/ML injection Inject 3 mg into the  muscle every Thursday.   . warfarin (COUMADIN) 5 MG tablet TAKE 1 TABLET DAILY BY MOUTH FOR 90 DAYS  . zolpidem (AMBIEN) 5 MG tablet Take 1-2 tablets by mouth at bedtime as needed for sleep.      Allergies:   Penicillins   Social History   Socioeconomic History  . Marital status: Divorced    Spouse name: Not on file  . Number of children: 2  . Years of education: Not on file  . Highest education level: Some college, no degree  Occupational History  . Not on file  Tobacco Use  . Smoking status: Former Smoker    Types: Cigars     Quit date: 09/03/2017    Years since quitting: 2.0  . Smokeless tobacco: Never Used  Vaping Use  . Vaping Use: Never used  Substance and Sexual Activity  . Alcohol use: No  . Drug use: No  . Sexual activity: Not on file  Other Topics Concern  . Not on file  Social History Narrative   Lives at home alone    Left handed   Caffeine: 1-2 cups daily   Social Determinants of Health   Financial Resource Strain:   . Difficulty of Paying Living Expenses:   Food Insecurity:   . Worried About Charity fundraiser in the Last Year:   . Arboriculturist in the Last Year:   Transportation Needs:   . Film/video editor (Medical):   Marland Kitchen Lack of Transportation (Non-Medical):   Physical Activity:   . Days of Exercise per Week:   . Minutes of Exercise per Session:   Stress:   . Feeling of Stress :   Social Connections:   . Frequency of Communication with Friends and Family:   . Frequency of Social Gatherings with Friends and Family:   . Attends Religious Services:   . Active Member of Clubs or Organizations:   . Attends Archivist Meetings:   Marland Kitchen Marital Status:      Family History: The patient's Family history is unknown by patient.  ROS:   Please see the history of present illness.    Losing weight.  Now down to 132 pounds.  Anorexia all other systems reviewed and are negative.  EKGs/Labs/Other Studies Reviewed:    The following studies were reviewed today: New data.  Reviewed results from right heart cath performed by Dr Haroldine Laws.  Also reviewed most recent echocardiogram from last July.  Echocardiogram 2020: IMPRESSIONS    1. The left ventricle has normal systolic function with an ejection  fraction of 60-65%. The cavity size was normal. There is moderately  increased left ventricular wall thickness. Left ventricular diastolic  function could not be evaluated secondary to  atrial fibrillation.  2. The right ventricle has severely reduced systolic function.  The cavity  was normal. Right ventricular systolic pressure is moderately elevated  with an estimated pressure of 54.9 mmHg.  3. Left atrial size was severely dilated.  4. The tricuspid valve is grossly normal.  5. The aortic valve is tricuspid. Mild thickening of the aortic valve.  Aortic valve regurgitation is mild by color flow Doppler. No stenosis of  the aortic valve.  6. There is mild dilatation of the aortic root and of the ascending aorta  measuring 44 mm.  7. Normal LV systolic function; moderate LVH; mildly dilated aortic root  (4.4 cm); mild AI; s/p MVR with mean gradient of 5 mmHg; severe LAE;  severe RV dysfunction;  mild TR; estimated RVSP moderately elevated.   EKG:  EKG did not repeat the EKG  Recent Labs: 02/22/2019: B Natriuretic Peptide 633.8 03/22/2019: BUN 33; Creatinine, Ser 2.04; Potassium 3.5; Sodium 134 08/28/2019: ALT 34; Hemoglobin 9.9; Platelets 145.0 Repeated and verified X2. 09/27/2019: TSH 2.06  Recent Lipid Panel No results found for: CHOL, TRIG, HDL, CHOLHDL, VLDL, LDLCALC, LDLDIRECT  Physical Exam:    VS:  BP (!) 112/50   Pulse 81   Ht 6\' 1"  (1.854 m)   Wt 132 lb 9.6 oz (60.1 kg)   SpO2 91%   BMI 17.49 kg/m     Wt Readings from Last 3 Encounters:  10/03/19 132 lb 9.6 oz (60.1 kg)  08/28/19 137 lb (62.1 kg)  06/15/19 139 lb 3.2 oz (63.1 kg)     GEN: Cachectic/wasted appearing. No acute distress HEENT: Normal NECK: Marked JVD to the angle of the jaw predominantly related to V wave.Marland Kitchen LYMPHATICS: No lymphadenopathy CARDIAC: Irregularly irregular RR without murmur, gallop, with trace bilateral ankle edema. VASCULAR:  Normal Pulses. No bruits. RESPIRATORY:  Clear to auscultation without rales, wheezing or rhonchi  ABDOMEN: Soft, non-tender, non-distended, No pulsatile mass, MUSCULOSKELETAL: No deformity  SKIN: Warm and dry NEUROLOGIC:  Alert and oriented x 3 PSYCHIATRIC:  Normal affect   ASSESSMENT:    1. Chronic atrial flutter  (HCC)   2. Chronic diastolic congestive heart failure ([place])   3. Other secondary pulmonary hypertension ([place])   4. Essential hypertension   5. Mitral valve disorder   6. Cirrhosis of liver with ascites, unspecified hepatic cirrhosis type ([place])    PLAN:    In order of problems listed above:  1. EKG not performed.  Presumed still present.  Continue anticoagulation therapy. 2. Volume status is stable.  Lungs are clear and there is trace bilateral lower extremity edema on combination of Aldactone, furosemide, and weekly metolazone. 3. The patient has severe pulmonary hypertension with significant right ventricular systolic dysfunction which limits his prognosis and options for therapy.  I asked if he understood his prognosis and he seemed to feel that he had other options available in the future.  I asked that he discuss this with the advanced heart failure team to have a realistic idea of future.  O2 saturation is 91%. 4. Blood pressure is stable.   5. Mechanical mitral valve.  Continue anticoagulation. 6. We did not discuss.  Still contributing to generalized condition of weakness.  Appetite may be related more to liver than to her heart.  Heart is contributing to liver dysfunction as well. 50-month follow-up  Medication Adjustments/Labs and Tests Ordered: Current medicines are reviewed at length with the patient today.  Concerns regarding medicines are outlined above.  No orders of the defined types were placed in this encounter.  No orders of the defined types were placed in this encounter.   Patient Instructions  Medication Instructions:  Your physician recommends that you continue on your current medications as directed. Please refer to the Current Medication list given to you today.  *If you need a refill on your cardiac medications before your next appointment, please call your pharmacy*   Lab Work: None If you have labs (blood work) drawn today and your tests are completely  normal, you will receive your results only by: Marland Kitchen MyChart Message (if you have MyChart) OR . A paper copy in the mail If you have any lab test that is abnormal or we need to change your treatment, we will call you to  review the results.   Testing/Procedures: None   Follow-Up: At [facility], you and your health needs are our priority.  As part of our continuing mission to provide you with exceptional heart care, we have created designated Provider Care Teams.  These Care Teams include your primary Cardiologist (physician) and Advanced Practice Providers (APPs -  Physician Assistants and Nurse Practitioners) who all work together to provide you with the care you need, when you need it.  We recommend signing up for the patient portal called "MyChart".  Sign up information is provided on this After Visit Summary.  MyChart is used to connect with patients for Virtual Visits (Telemedicine).  Patients are able to view lab/test results, encounter notes, upcoming appointments, etc.  Non-urgent messages can be sent to your provider as well.   To learn more about what you can do with MyChart, go to [email or web address].    Your next appointment:   6 month(s)  The format for your next appointment:   In Person  Provider:   You may see Sinclair Grooms, MD or one of the following Advanced Practice Providers on your designated Care Team:    Truitt Merle, NP  Cecilie Kicks, NP  Kathyrn Drown, NP    Other Instructions      Signed, Sinclair Grooms, MD  10/03/2019 2:35 PM    [place]

## 2019-10-12 ENCOUNTER — Ambulatory Visit: Payer: Medicare Other

## 2019-10-12 DIAGNOSIS — I4892 Unspecified atrial flutter: Secondary | ICD-10-CM | POA: Diagnosis not present

## 2019-10-12 DIAGNOSIS — I059 Rheumatic mitral valve disease, unspecified: Secondary | ICD-10-CM

## 2019-10-12 DIAGNOSIS — Z5181 Encounter for therapeutic drug level monitoring: Secondary | ICD-10-CM

## 2019-10-12 LAB — POCT INR: INR: 4.1 — AB (ref 2.0–3.0)

## 2019-10-12 NOTE — Patient Instructions (Signed)
Hold today, then start taking 1 tablet daily except for 1/2 tablet on Sundays and Thursdays. Recheck INR in 3 weeks. Colonoscopy 11/19/2019, needs bridge.  Continue drinking 2 Ensures each day.  Please call [phone] if you have any procedures or medication changes.

## 2019-10-16 ENCOUNTER — Telehealth (HOSPITAL_COMMUNITY): Payer: Self-pay

## 2019-10-16 NOTE — Telephone Encounter (Signed)
appt scheduled,pt made aware patient will come in 7/14 for labs and directions were discussed with patient via telephone and directions were also sent to patient via my chart

## 2019-10-16 NOTE — Progress Notes (Signed)
Cath scheduled,pt made aware of all directions and a mychart message was sent as well

## 2019-10-16 NOTE — Telephone Encounter (Signed)
-----   Message from Scarlette Calico, RN sent at 10/12/2019  4:23 PM EDT ----- See message below will you please sch him for RHC w/DB the week of 7/19, thanks ----- Message ----- From: Jolaine Artist, MD Sent: 10/05/2019  10:12 AM EDT To: Belva Crome, MD, Scarlette Calico, RN  Thanks [name].  Not sure if it is all HF  Heather - let's reach out to Mr. Stickels and repeat RHC to see what his numbers look like. If low may be able to offer him some inotrope support and see if that helps. Thanks -dan ----- Message ----- From: Belva Crome, MD Sent: 10/03/2019   2:40 PM EDT To: Jolaine Artist, MD  Linna Hoff,  I saw Myna Hidalgo today and he continues to lose weight.  He is developing cachexia.  I do not believe we have anything else to offer him given RV failure.  Is this correct?  He was stating that he wanted to get into see you sooner than September but I am not sure if there is anything else to do.  [name]

## 2019-10-17 ENCOUNTER — Other Ambulatory Visit: Payer: Self-pay

## 2019-10-17 ENCOUNTER — Ambulatory Visit (HOSPITAL_COMMUNITY)
Admission: RE | Admit: 2019-10-17 | Discharge: 2019-10-17 | Disposition: A | Discharge status: Home / Self Care | Payer: Medicare Other | Source: Ambulatory Visit | Attending: Cardiology | Admitting: Cardiology

## 2019-10-17 ENCOUNTER — Other Ambulatory Visit (HOSPITAL_COMMUNITY): Payer: Self-pay | Admitting: *Deleted

## 2019-10-17 DIAGNOSIS — I4892 Unspecified atrial flutter: Secondary | ICD-10-CM

## 2019-10-17 LAB — BASIC METABOLIC PANEL
Anion gap: 11 (ref 5–15)
BUN: 36 mg/dL — ABNORMAL HIGH (ref 8–23)
CO2: 25 mmol/L (ref 22–32)
Calcium: 9.6 mg/dL (ref 8.9–10.3)
Chloride: 100 mmol/L (ref 98–111)
Creatinine, Ser: 2.34 mg/dL — ABNORMAL HIGH (ref 0.61–1.24)
GFR calc Af Amer: 32 mL/min — ABNORMAL LOW (ref >60)
GFR calc non Af Amer: 28 mL/min — ABNORMAL LOW (ref >60)
Glucose, Bld: 94 mg/dL (ref 70–99)
Potassium: 3.4 mmol/L — ABNORMAL LOW (ref 3.5–5.1)
Sodium: 136 mmol/L (ref 135–145)

## 2019-10-17 LAB — CBC
HCT: 33.8 % — ABNORMAL LOW (ref 39.0–52.0)
Hemoglobin: 10.2 g/dL — ABNORMAL LOW (ref 13.0–17.0)
MCH: 33 pg (ref 26.0–34.0)
MCHC: 30.2 g/dL (ref 30.0–36.0)
MCV: 109.4 fL — ABNORMAL HIGH (ref 80.0–100.0)
Platelets: 95 10*3/uL — ABNORMAL LOW (ref 150–400)
RBC: 3.09 MIL/uL — ABNORMAL LOW (ref 4.22–5.81)
RDW: 24.7 % — ABNORMAL HIGH (ref 11.5–15.5)
WBC: 4.4 10*3/uL (ref 4.0–10.5)
nRBC: 0 % (ref 0.0–0.2)

## 2019-10-22 ENCOUNTER — Telehealth: Payer: Self-pay

## 2019-10-22 ENCOUNTER — Other Ambulatory Visit (HOSPITAL_COMMUNITY): Payer: Self-pay | Admitting: *Deleted

## 2019-10-22 DIAGNOSIS — I2729 Other secondary pulmonary hypertension: Secondary | ICD-10-CM

## 2019-10-22 MED ORDER — SODIUM CHLORIDE 0.9% FLUSH: 3.0000 mL | Freq: Two times a day (BID) | INTRAVENOUS | Status: DC

## 2019-10-22 NOTE — Telephone Encounter (Signed)
-----   Message from Yevette Edwards, RN sent at 10/01/2019 12:09 PM EDT ----- Regarding: Labs Repeat cbc, order in epic

## 2019-10-22 NOTE — Telephone Encounter (Signed)
Spoke with patient, he is aware of results and to keep scheduled procedures in August.

## 2019-10-22 NOTE — Telephone Encounter (Signed)
Patient recently had repeat CBC at hospital on 10/17/19. Please advise, thank you.

## 2019-10-22 NOTE — Telephone Encounter (Signed)
Thanks [place], CBC is stable, I will see him in August for his procedures at the hospital

## 2019-10-23 ENCOUNTER — Ambulatory Visit (HOSPITAL_COMMUNITY)
Admission: RE | Admit: 2019-10-23 | Discharge: 2019-10-23 | Disposition: A | Discharge status: Home / Self Care | Payer: Medicare Other | Attending: Internal Medicine | Admitting: Internal Medicine

## 2019-10-23 ENCOUNTER — Encounter (HOSPITAL_COMMUNITY): Payer: Self-pay | Admitting: Internal Medicine

## 2019-10-23 ENCOUNTER — Other Ambulatory Visit: Payer: Self-pay

## 2019-10-23 ENCOUNTER — Encounter (HOSPITAL_COMMUNITY)
Admission: RE | Disposition: A | Discharge status: Home / Self Care | Payer: Self-pay | Source: Home / Self Care | Attending: Internal Medicine

## 2019-10-23 DIAGNOSIS — I712 Thoracic aortic aneurysm, without rupture: Secondary | ICD-10-CM | POA: Insufficient documentation

## 2019-10-23 DIAGNOSIS — Z7901 Long term (current) use of anticoagulants: Secondary | ICD-10-CM | POA: Diagnosis not present

## 2019-10-23 DIAGNOSIS — D649 Anemia, unspecified: Secondary | ICD-10-CM | POA: Diagnosis not present

## 2019-10-23 DIAGNOSIS — Z79899 Other long term (current) drug therapy: Secondary | ICD-10-CM | POA: Insufficient documentation

## 2019-10-23 DIAGNOSIS — I5032 Chronic diastolic (congestive) heart failure: Secondary | ICD-10-CM | POA: Insufficient documentation

## 2019-10-23 DIAGNOSIS — N183 Chronic kidney disease, stage 3 unspecified: Secondary | ICD-10-CM | POA: Insufficient documentation

## 2019-10-23 DIAGNOSIS — I2729 Other secondary pulmonary hypertension: Secondary | ICD-10-CM | POA: Insufficient documentation

## 2019-10-23 DIAGNOSIS — I4892 Unspecified atrial flutter: Secondary | ICD-10-CM | POA: Diagnosis not present

## 2019-10-23 DIAGNOSIS — R188 Other ascites: Secondary | ICD-10-CM | POA: Diagnosis not present

## 2019-10-23 DIAGNOSIS — I130 Hypertensive heart and chronic kidney disease with heart failure and stage 1 through stage 4 chronic kidney disease, or unspecified chronic kidney disease: Secondary | ICD-10-CM | POA: Diagnosis present

## 2019-10-23 DIAGNOSIS — R5383 Other fatigue: Secondary | ICD-10-CM | POA: Insufficient documentation

## 2019-10-23 DIAGNOSIS — I080 Rheumatic disorders of both mitral and aortic valves: Secondary | ICD-10-CM | POA: Insufficient documentation

## 2019-10-23 DIAGNOSIS — I4891 Unspecified atrial fibrillation: Secondary | ICD-10-CM | POA: Diagnosis not present

## 2019-10-23 DIAGNOSIS — I2721 Secondary pulmonary arterial hypertension: Secondary | ICD-10-CM

## 2019-10-23 DIAGNOSIS — Z87891 Personal history of nicotine dependence: Secondary | ICD-10-CM | POA: Diagnosis not present

## 2019-10-23 DIAGNOSIS — Z952 Presence of prosthetic heart valve: Secondary | ICD-10-CM | POA: Insufficient documentation

## 2019-10-23 DIAGNOSIS — K7460 Unspecified cirrhosis of liver: Secondary | ICD-10-CM | POA: Insufficient documentation

## 2019-10-23 HISTORY — PX: RIGHT HEART CATH: CATH118263

## 2019-10-23 LAB — BASIC METABOLIC PANEL
Anion gap: 6 (ref 5–15)
BUN: 34 mg/dL — ABNORMAL HIGH (ref 8–23)
CO2: 24 mmol/L (ref 22–32)
Calcium: 9 mg/dL (ref 8.9–10.3)
Chloride: 106 mmol/L (ref 98–111)
Creatinine, Ser: 2.29 mg/dL — ABNORMAL HIGH (ref 0.61–1.24)
GFR calc Af Amer: 33 mL/min — ABNORMAL LOW (ref >60)
GFR calc non Af Amer: 28 mL/min — ABNORMAL LOW (ref >60)
Glucose, Bld: 93 mg/dL (ref 70–99)
Potassium: 3.9 mmol/L (ref 3.5–5.1)
Sodium: 136 mmol/L (ref 135–145)

## 2019-10-23 LAB — POCT I-STAT EG7
Acid-base deficit: 1 mmol/L (ref 0.0–2.0)
Acid-base deficit: 2 mmol/L (ref 0.0–2.0)
Bicarbonate: 23.6 mmol/L (ref 20.0–28.0)
Bicarbonate: 25.1 mmol/L (ref 20.0–28.0)
Calcium, Ion: 1.1 mmol/L — ABNORMAL LOW (ref 1.15–1.40)
Calcium, Ion: 1.21 mmol/L (ref 1.15–1.40)
HCT: 29 % — ABNORMAL LOW (ref 39.0–52.0)
HCT: 30 % — ABNORMAL LOW (ref 39.0–52.0)
Hemoglobin: 10.2 g/dL — ABNORMAL LOW (ref 13.0–17.0)
Hemoglobin: 9.9 g/dL — ABNORMAL LOW (ref 13.0–17.0)
O2 Saturation: 52 %
O2 Saturation: 54 %
Potassium: 3.4 mmol/L — ABNORMAL LOW (ref 3.5–5.1)
Potassium: 3.6 mmol/L (ref 3.5–5.1)
Sodium: 143 mmol/L (ref 135–145)
Sodium: 145 mmol/L (ref 135–145)
TCO2: 25 mmol/L (ref 22–32)
TCO2: 26 mmol/L (ref 22–32)
pCO2, Ven: 42 mmHg — ABNORMAL LOW (ref 44.0–60.0)
pCO2, Ven: 44.4 mmHg (ref 44.0–60.0)
pH, Ven: 7.358 (ref 7.250–7.430)
pH, Ven: 7.36 (ref 7.250–7.430)
pO2, Ven: 29 mmHg — CL (ref 32.0–45.0)
pO2, Ven: 30 mmHg — CL (ref 32.0–45.0)

## 2019-10-23 LAB — PROTIME-INR
INR: 1.9 — ABNORMAL HIGH (ref 0.8–1.2)
Prothrombin Time: 20.8 seconds — ABNORMAL HIGH (ref 11.4–15.2)

## 2019-10-23 SURGERY — RIGHT HEART CATH
Anesthesia: LOCAL

## 2019-10-23 MED ORDER — ASPIRIN 81 MG PO CHEW
CHEWABLE_TABLET | ORAL | Status: AC
  Filled 2019-10-23: qty 1

## 2019-10-23 MED ORDER — ACETAMINOPHEN 325 MG PO TABS: 650.0000 mg | ORAL_TABLET | ORAL | Status: DC | PRN

## 2019-10-23 MED ORDER — HYDRALAZINE HCL 20 MG/ML IJ SOLN: 10.0000 mg | INTRAMUSCULAR | Status: DC | PRN

## 2019-10-23 MED ORDER — LABETALOL HCL 5 MG/ML IV SOLN: 10.0000 mg | INTRAVENOUS | Status: DC | PRN

## 2019-10-23 MED ORDER — LIDOCAINE HCL (PF) 1 % IJ SOLN
INTRAMUSCULAR | Status: DC | PRN
  Administered 2019-10-23: 2 mL

## 2019-10-23 MED ORDER — SODIUM CHLORIDE 0.9 % IV SOLN: INTRAVENOUS | Status: DC

## 2019-10-23 MED ORDER — SODIUM CHLORIDE 0.9% FLUSH: 3.0000 mL | Freq: Two times a day (BID) | INTRAVENOUS | Status: DC

## 2019-10-23 MED ORDER — LIDOCAINE HCL (PF) 1 % IJ SOLN
INTRAMUSCULAR | Status: AC
  Filled 2019-10-23: qty 30

## 2019-10-23 MED ORDER — ONDANSETRON HCL 4 MG/2ML IJ SOLN: 4.0000 mg | Freq: Four times a day (QID) | INTRAMUSCULAR | Status: DC | PRN

## 2019-10-23 MED ORDER — SODIUM CHLORIDE 0.9% FLUSH: 3.0000 mL | INTRAVENOUS | Status: DC | PRN

## 2019-10-23 MED ORDER — HEPARIN (PORCINE) IN NACL 1000-0.9 UT/500ML-% IV SOLN
INTRAVENOUS | Status: DC | PRN
  Administered 2019-10-23: 500 mL

## 2019-10-23 MED ORDER — ASPIRIN 81 MG PO CHEW: 81.0000 mg | CHEWABLE_TABLET | ORAL | Status: DC

## 2019-10-23 MED ORDER — SODIUM CHLORIDE 0.9 % IV SOLN: 250.0000 mL | INTRAVENOUS | Status: DC | PRN

## 2019-10-23 MED ORDER — HEPARIN (PORCINE) IN NACL 1000-0.9 UT/500ML-% IV SOLN
INTRAVENOUS | Status: AC
  Filled 2019-10-23: qty 500

## 2019-10-23 SURGICAL SUPPLY — 9 items
CATH SWAN DBL LUMAN 5F 110 (CATHETERS) ×1 IMPLANT
GLIDESHEATH SLEND SS 6F .021 (SHEATH) ×1 IMPLANT
GUIDEWIRE .025 260CM (WIRE) ×1 IMPLANT
PACK CARDIAC CATHETERIZATION (CUSTOM PROCEDURE TRAY) ×2 IMPLANT
PROTECTION STATION PRESSURIZED (MISCELLANEOUS) ×2
STATION PROTECTION PRESSURIZED (MISCELLANEOUS) IMPLANT
TRANSDUCER W/STOPCOCK (MISCELLANEOUS) ×2 IMPLANT
TUBING ART PRESS 72  MALE/FEM (TUBING) ×2
TUBING ART PRESS 72 MALE/FEM (TUBING) IMPLANT

## 2019-10-23 NOTE — Interval H&P Note (Signed)
History and Physical Interval Note:  10/23/2019 10:21 AM  [name]  has presented today for surgery, with the diagnosis of congestive heart failure.  The various methods of treatment have been discussed with the patient and family. After consideration of risks, benefits and other options for treatment, the patient has consented to  Procedure(s): RIGHT HEART CATH (N/A) as a surgical intervention.  The patient's history has been reviewed, patient examined, no change in status, stable for surgery.  I have reviewed the patient's chart and labs.  Questions were answered to the patient's satisfaction.     Dimple Bastyr

## 2019-10-23 NOTE — Discharge Instructions (Signed)
Radial Site Care  This sheet gives you information about how to care for yourself after your procedure. Your health care provider may also give you more specific instructions. If you have problems or questions, contact your health care provider. What can I expect after the procedure? After the procedure, it is common to have:  Bruising and tenderness at the catheter insertion area. Follow these instructions at home: Medicines  Take over-the-counter and prescription medicines only as told by your health care provider. Insertion site care  Follow instructions from your health care provider about how to take care of your insertion site. Make sure you: ? Wash your hands with soap and water before you change your bandage (dressing). If soap and water are not available, use hand sanitizer. ? Change your dressing as told by your health care provider. ? Leave stitches (sutures), skin glue, or adhesive strips in place. These skin closures may need to stay in place for 2 weeks or longer. If adhesive strip edges start to loosen and curl up, you may trim the loose edges. Do not remove adhesive strips completely unless your health care provider tells you to do that.  Check your insertion site every day for signs of infection. Check for: ? Redness, swelling, or pain. ? Fluid or blood. ? Pus or a bad smell. ? Warmth.  Do not take baths, swim, or use a hot tub until your health care provider approves.  You may shower 24-48 hours after the procedure, or as directed by your health care provider. ? Remove the dressing and gently wash the site with plain soap and water. ? Pat the area dry with a clean towel. ? Do not rub the site. That could cause bleeding.  Do not apply powder or lotion to the site. Activity   For 24 hours after the procedure, or as directed by your health care provider: ? Do not flex or bend the affected arm. ? Do not push or pull heavy objects with the affected arm. ? Do not  drive yourself home from the hospital or clinic. You may drive 24 hours after the procedure unless your health care provider tells you not to. ? Do not operate machinery or power tools.  Do not lift anything that is heavier than 10 lb (4.5 kg), or the limit that you are told, until your health care provider says that it is safe.  Ask your health care provider when it is okay to: ? Return to work or school. ? Resume usual physical activities or sports. ? Resume sexual activity. General instructions  If the catheter site starts to bleed, raise your arm and put firm pressure on the site. If the bleeding does not stop, get help right away. This is a medical emergency.  If you went home on the same day as your procedure, a responsible adult should be with you for the first 24 hours after you arrive home.  Keep all follow-up visits as told by your health care provider. This is important. Contact a health care provider if:  You have a fever.  You have redness, swelling, or yellow drainage around your insertion site. Get help right away if:  You have unusual pain at the radial site.  The catheter insertion area swells very fast.  The insertion area is bleeding, and the bleeding does not stop when you hold steady pressure on the area.  Your arm or hand becomes pale, cool, tingly, or numb. These symptoms may represent a serious problem   that is an emergency. Do not wait to see if the symptoms will go away. Get medical help right away. Call your local emergency services (911 in the [place] Do not drive yourself to the hospital. Summary  After the procedure, it is common to have bruising and tenderness at the site.  Follow instructions from your health care provider about how to take care of your radial site wound. Check the wound every day for signs of infection.  Do not lift anything that is heavier than 10 lb (4.5 kg), or the limit that you are told, until your health care provider says  that it is safe. This information is not intended to replace advice given to you by your health care provider. Make sure you discuss any questions you have with your health care provider. Document Revised: 04/27/2017 Document Reviewed: 04/27/2017 Elsevier Patient Education  2020 [employer]  

## 2019-10-26 ENCOUNTER — Other Ambulatory Visit: Payer: Self-pay

## 2019-10-26 ENCOUNTER — Ambulatory Visit (INDEPENDENT_AMBULATORY_CARE_PROVIDER_SITE_OTHER): Payer: Medicare Other | Admitting: *Deleted

## 2019-10-26 DIAGNOSIS — Z5181 Encounter for therapeutic drug level monitoring: Secondary | ICD-10-CM | POA: Diagnosis not present

## 2019-10-26 DIAGNOSIS — I4892 Unspecified atrial flutter: Secondary | ICD-10-CM | POA: Diagnosis not present

## 2019-10-26 DIAGNOSIS — I059 Rheumatic mitral valve disease, unspecified: Secondary | ICD-10-CM

## 2019-10-26 LAB — POCT INR: INR: 1.8 — AB (ref 2.0–3.0)

## 2019-10-26 NOTE — Patient Instructions (Addendum)
Description   Take 1.5 tablets today and then continue taking 1 tablet daily except for 1/2 tablet on Sundays and Thursdays. Recheck INR in 2 weeks. Colonoscopy 11/19/2019, needs bridge.  Continue drinking 2 Ensures each day.  Please call [phone] if you have any procedures or medication changes.

## 2019-10-31 ENCOUNTER — Encounter (HOSPITAL_COMMUNITY): Payer: Self-pay

## 2019-11-12 ENCOUNTER — Ambulatory Visit (INDEPENDENT_AMBULATORY_CARE_PROVIDER_SITE_OTHER): Payer: Medicare Other | Admitting: *Deleted

## 2019-11-12 ENCOUNTER — Other Ambulatory Visit: Payer: Self-pay

## 2019-11-12 DIAGNOSIS — Z5181 Encounter for therapeutic drug level monitoring: Secondary | ICD-10-CM | POA: Diagnosis not present

## 2019-11-12 DIAGNOSIS — I059 Rheumatic mitral valve disease, unspecified: Secondary | ICD-10-CM | POA: Diagnosis not present

## 2019-11-12 DIAGNOSIS — I4892 Unspecified atrial flutter: Secondary | ICD-10-CM

## 2019-11-12 LAB — POCT INR: INR: 2 (ref 2.0–3.0)

## 2019-11-12 MED ORDER — ENOXAPARIN SODIUM 60 MG/0.6ML ~~LOC~~ SOLN: 60.0000 mg | SUBCUTANEOUS | 1 refills | Status: DC

## 2019-11-12 NOTE — Patient Instructions (Addendum)
°  Description   Take 1.5 tablets today and then start taking 1 tablet daily except for 1/2 tablet on Sundays. Refer patient instructions for upcoming procedure. Recheck INR in 1 week post procedure. Continue drinking 2 Ensures each day.  Please call [phone] if you have any procedures or medication changes.    If your GI Doctor has you to hold on resuming your Warfarin more than one day post procedure call us at [phone].   8/10: Last dose of Coumadin.  8/11: No Coumadin or Lovenox.  8/12: Inject Lovenox 60 mg in the fatty abdominal tissue at least 2 inches from the belly button once a day at 8pm rotate sites. No Coumadin.  8/13: Inject Lovenox in the fatty tissue at 8pm. No Coumadin.  8/14: Inject Lovenox in the fatty tissue at 8pm. No Coumadin.  8/15: No Lovenox. No Coumadin.  8/16: Procedure Day - No Lovenox - Resume Coumadin in the evening or as directed by doctor (take an extra half tablet with usual dose for 2 days then resume normal dose).  8/17: Resume Lovenox inject in the fatty tissue at 8am and take Coumadin (take an extra half tablet with usual dose).  8/18: Inject Lovenox in the fatty tissue at 8am and take Coumadin.  8/19: Inject Lovenox in the fatty tissue at 8am and take Coumadin.  8/20: Inject Lovenox in the fatty tissue at 8am and take Coumadin.  8/21: Inject Lovenox in the fatty tissue at 8am and take Coumadin.  8/22:  Inject Lovenox in the fatty tissue at 8am and take Coumadin.  8/23: Inject Lovenox in the fatty tissue at 8am and report to Coumadin appt to check INR.

## 2019-11-13 ENCOUNTER — Telehealth: Payer: Self-pay | Admitting: Gastroenterology

## 2019-11-13 NOTE — Telephone Encounter (Signed)
Pt is requesting a call back from a nurse to discuss the procedure he has schedule at hospital

## 2019-11-13 NOTE — Telephone Encounter (Signed)
Spoke with patient he was wanting to know about prep instructions, advised that a letter was sent to him after office visit in May. Pt states that he is able to access My Chart and see if he will be able to review the letter. Pt states that he has already picked up his prep from the pharmacy. Advised patient to call back if he is unable to view letter.

## 2019-11-14 ENCOUNTER — Other Ambulatory Visit: Payer: Self-pay

## 2019-11-14 NOTE — Progress Notes (Signed)
Attempted to obtain medical history via telephone, unable to reach at this time. I left a voicemail to return pre surgical testing department's phone call.  

## 2019-11-15 ENCOUNTER — Other Ambulatory Visit (HOSPITAL_COMMUNITY)
Admission: RE | Admit: 2019-11-15 | Discharge: 2019-11-15 | Disposition: A | Discharge status: Home / Self Care | Payer: Medicare Other | Source: Ambulatory Visit | Attending: Gastroenterology | Admitting: Gastroenterology

## 2019-11-15 DIAGNOSIS — Z01812 Encounter for preprocedural laboratory examination: Secondary | ICD-10-CM | POA: Diagnosis present

## 2019-11-15 DIAGNOSIS — Z20822 Contact with and (suspected) exposure to covid-19: Secondary | ICD-10-CM | POA: Insufficient documentation

## 2019-11-15 LAB — SARS CORONAVIRUS 2 (TAT 6-24 HRS): SARS Coronavirus 2: NEGATIVE

## 2019-11-16 NOTE — Anesthesia Preprocedure Evaluation (Addendum)
Anesthesia Evaluation  Patient identified by MRN, date of birth, ID band Patient awake    Reviewed: Allergy & Precautions, NPO status , Patient's Chart, lab work & pertinent test results  Airway Mallampati: I  TM Distance: >3 FB Neck ROM: Full    Dental  (+) Teeth Intact, Dental Advisory Given   Pulmonary former smoker,  Quit smoking 2019   Pulmonary exam normal breath sounds clear to auscultation       Cardiovascular hypertension, Pt. on medications +CHF (severe RVF)  Normal cardiovascular exam+ dysrhythmias (on coumadin) Atrial Fibrillation  Rhythm:Regular Rate:Normal  Last echo 10/2018: 1. The left ventricle has normal systolic function with an ejection  fraction of 60-65%. The cavity size was normal. There is moderately  increased left ventricular wall thickness. Left ventricular diastolic  function could not be evaluated secondary to  atrial fibrillation.  2. The right ventricle has severely reduced systolic function. The cavity  was normal. Right ventricular systolic pressure is moderately elevated  with an estimated pressure of 54.9 mmHg.  3. Left atrial size was severely dilated.  4. The tricuspid valve is grossly normal.  5. The aortic valve is tricuspid. Mild thickening of the aortic valve.  Aortic valve regurgitation is mild by color flow Doppler. No stenosis of  the aortic valve.  6. There is mild dilatation of the aortic root and of the ascending aorta  measuring 44 mm.  7. Normal LV systolic function; moderate LVH; mildly dilated aortic root  (4.4 cm); mild AI; s/p MVR with mean gradient of 5 mmHg; severe LAE;  severe RV dysfunction; mild TR; estimated RVSP moderately elevated.   Rheumatic heart disease s/p MVR- chronic A/C, lovenox bridge preop  Ascending aortic aneurysm- Ectatic ascending aorta measuring approximately 4.4 cm on last CT chest 2020 (stable from imaging in 2012)   Neuro/Psych negative  neurological ROS  negative psych ROS   GI/Hepatic negative GI ROS, (+) Cirrhosis       , Hepatitis -, CHCV- treated  Last plt count 10/2019: 95   Endo/Other  negative endocrine ROS  Renal/GU negative Renal ROS  negative genitourinary   Musculoskeletal negative musculoskeletal ROS (+)   Abdominal Normal abdominal exam  (+)   Peds  Hematology  (+) Blood dyscrasia, anemia ,   Anesthesia Other Findings   Reproductive/Obstetrics negative OB ROS                           Anesthesia Physical Anesthesia Plan  ASA: IV  Anesthesia Plan: MAC   Post-op Pain Management:    Induction:   PONV Risk Score and Plan: 2 and Propofol infusion and TIVA  Airway Management Planned: Natural Airway and Simple Face Mask  Additional Equipment: None  Intra-op Plan:   Post-operative Plan:   Informed Consent: I have reviewed the patients History and Physical, chart, labs and discussed the procedure including the risks, benefits and alternatives for the proposed anesthesia with the patient or authorized representative who has indicated his/her understanding and acceptance.       Plan Discussed with: CRNA  Anesthesia Plan Comments:        Anesthesia Quick Evaluation

## 2019-11-17 ENCOUNTER — Other Ambulatory Visit: Payer: Self-pay | Admitting: Student in an Organized Health Care Education/Training Program

## 2019-11-19 ENCOUNTER — Other Ambulatory Visit: Payer: Self-pay

## 2019-11-19 ENCOUNTER — Ambulatory Visit (HOSPITAL_COMMUNITY): Payer: Medicare Other | Admitting: Anesthesiology

## 2019-11-19 ENCOUNTER — Encounter (HOSPITAL_COMMUNITY)
Admission: RE | Disposition: A | Discharge status: Home / Self Care | Payer: Self-pay | Source: Home / Self Care | Attending: Gastroenterology

## 2019-11-19 ENCOUNTER — Ambulatory Visit (HOSPITAL_COMMUNITY)
Admission: RE | Admit: 2019-11-19 | Discharge: 2019-11-19 | Disposition: A | Discharge status: Home / Self Care | Payer: Medicare Other | Attending: Gastroenterology | Admitting: Gastroenterology

## 2019-11-19 ENCOUNTER — Encounter (HOSPITAL_COMMUNITY): Payer: Self-pay | Admitting: Gastroenterology

## 2019-11-19 DIAGNOSIS — I509 Heart failure, unspecified: Secondary | ICD-10-CM | POA: Insufficient documentation

## 2019-11-19 DIAGNOSIS — I110 Hypertensive heart disease with heart failure: Secondary | ICD-10-CM | POA: Insufficient documentation

## 2019-11-19 DIAGNOSIS — K7460 Unspecified cirrhosis of liver: Secondary | ICD-10-CM | POA: Insufficient documentation

## 2019-11-19 DIAGNOSIS — Z86010 Personal history of colonic polyps: Secondary | ICD-10-CM | POA: Diagnosis not present

## 2019-11-19 DIAGNOSIS — Z1389 Encounter for screening for other disorder: Secondary | ICD-10-CM | POA: Diagnosis present

## 2019-11-19 DIAGNOSIS — K7469 Other cirrhosis of liver: Secondary | ICD-10-CM | POA: Insufficient documentation

## 2019-11-19 DIAGNOSIS — K317 Polyp of stomach and duodenum: Secondary | ICD-10-CM

## 2019-11-19 DIAGNOSIS — N400 Enlarged prostate without lower urinary tract symptoms: Secondary | ICD-10-CM | POA: Insufficient documentation

## 2019-11-19 DIAGNOSIS — D126 Benign neoplasm of colon, unspecified: Secondary | ICD-10-CM

## 2019-11-19 DIAGNOSIS — D132 Benign neoplasm of duodenum: Secondary | ICD-10-CM | POA: Diagnosis not present

## 2019-11-19 DIAGNOSIS — Z79899 Other long term (current) drug therapy: Secondary | ICD-10-CM | POA: Insufficient documentation

## 2019-11-19 DIAGNOSIS — D122 Benign neoplasm of ascending colon: Secondary | ICD-10-CM | POA: Diagnosis not present

## 2019-11-19 DIAGNOSIS — Z954 Presence of other heart-valve replacement: Secondary | ICD-10-CM | POA: Insufficient documentation

## 2019-11-19 DIAGNOSIS — Z7901 Long term (current) use of anticoagulants: Secondary | ICD-10-CM | POA: Diagnosis not present

## 2019-11-19 DIAGNOSIS — I4891 Unspecified atrial fibrillation: Secondary | ICD-10-CM | POA: Insufficient documentation

## 2019-11-19 DIAGNOSIS — Z87891 Personal history of nicotine dependence: Secondary | ICD-10-CM | POA: Diagnosis not present

## 2019-11-19 DIAGNOSIS — Z1211 Encounter for screening for malignant neoplasm of colon: Secondary | ICD-10-CM | POA: Diagnosis not present

## 2019-11-19 HISTORY — PX: HEMOSTASIS CLIP PLACEMENT: SHX6857

## 2019-11-19 HISTORY — PX: BIOPSY: SHX5522

## 2019-11-19 HISTORY — PX: ESOPHAGOGASTRODUODENOSCOPY (EGD) WITH PROPOFOL: SHX5813

## 2019-11-19 HISTORY — PX: COLONOSCOPY WITH PROPOFOL: SHX5780

## 2019-11-19 HISTORY — PX: POLYPECTOMY: SHX5525

## 2019-11-19 LAB — CBC
HCT: 32.4 % — ABNORMAL LOW (ref 39.0–52.0)
Hemoglobin: 10.2 g/dL — ABNORMAL LOW (ref 13.0–17.0)
MCH: 34.1 pg — ABNORMAL HIGH (ref 26.0–34.0)
MCHC: 31.5 g/dL (ref 30.0–36.0)
MCV: 108.4 fL — ABNORMAL HIGH (ref 80.0–100.0)
Platelets: 91 10*3/uL — ABNORMAL LOW (ref 150–400)
RBC: 2.99 MIL/uL — ABNORMAL LOW (ref 4.22–5.81)
RDW: 22.1 % — ABNORMAL HIGH (ref 11.5–15.5)
WBC: 3.7 10*3/uL — ABNORMAL LOW (ref 4.0–10.5)
nRBC: 0 % (ref 0.0–0.2)

## 2019-11-19 LAB — PROTIME-INR
INR: 1.2 (ref 0.8–1.2)
Prothrombin Time: 14.9 seconds (ref 11.4–15.2)

## 2019-11-19 SURGERY — ESOPHAGOGASTRODUODENOSCOPY (EGD) WITH PROPOFOL
Anesthesia: Monitor Anesthesia Care

## 2019-11-19 MED ORDER — LIDOCAINE 2% (20 MG/ML) 5 ML SYRINGE
INTRAMUSCULAR | Status: DC | PRN
  Administered 2019-11-19: 80 mg via INTRAVENOUS

## 2019-11-19 MED ORDER — PROPOFOL 500 MG/50ML IV EMUL
INTRAVENOUS | Status: DC | PRN
  Administered 2019-11-19: 125 ug/kg/min via INTRAVENOUS

## 2019-11-19 MED ORDER — PROPOFOL 10 MG/ML IV BOLUS
INTRAVENOUS | Status: DC | PRN
  Administered 2019-11-19: 50 mg via INTRAVENOUS

## 2019-11-19 MED ORDER — LACTATED RINGERS IV SOLN: INTRAVENOUS | Status: DC

## 2019-11-19 MED ORDER — LACTATED RINGERS IV SOLN: INTRAVENOUS | Status: DC | PRN

## 2019-11-19 MED ORDER — PHENYLEPHRINE 40 MCG/ML (10ML) SYRINGE FOR IV PUSH (FOR BLOOD PRESSURE SUPPORT)
PREFILLED_SYRINGE | INTRAVENOUS | Status: DC | PRN
  Administered 2019-11-19: 120 ug via INTRAVENOUS
  Administered 2019-11-19 (×2): 80 ug via INTRAVENOUS
  Administered 2019-11-19 (×3): 120 ug via INTRAVENOUS
  Administered 2019-11-19: 80 ug via INTRAVENOUS
  Administered 2019-11-19: 120 ug via INTRAVENOUS
  Administered 2019-11-19 (×2): 80 ug via INTRAVENOUS

## 2019-11-19 MED ORDER — PHENYLEPHRINE HCL-NACL 10-0.9 MG/250ML-% IV SOLN
INTRAVENOUS | Status: DC | PRN
Start: 2019-11-19 — End: 2019-11-19
  Administered 2019-11-19: 75 ug/min via INTRAVENOUS

## 2019-11-19 SURGICAL SUPPLY — 25 items

## 2019-11-19 NOTE — H&P (Signed)
HPI:   [name] is a 69 y.o. 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 He denies any shortness of breath or chest pains today. No blood in his stools or pains. Case done at the hospital given his cardiac history. He has not been on coumadin for 5 days (last dose last Tuesday) and last dose lovenox sat PM (night of 8/14) per coumadin clinic instructions.    Past Medical History:  Diagnosis Date  . Allergy   . Aneurysm of ascending aorta (HCC)   . Atrial fibrillation ([place])   . BPH (benign prostatic hyperplasia)   . Colon polyps   . FHx: rheumatic heart disease   . H/O diplopia   . Hepatitis C   . Hypertension   . Hypogonadism male   . Mitral valve disease    mitral valve repair/replacement x3    Past Surgical History:  Procedure Laterality Date  . COLON SURGERY    . COLONOSCOPY    . COLONOSCOPY N/A 11/14/2012   Procedure: COLONOSCOPY;  Surgeon: Lear Ng, MD;  Location: [facility];  Service: Endoscopy;  Laterality: N/A;  . ESOPHAGOGASTRODUODENOSCOPY (EGD) WITH PROPOFOL N/A 05/08/2014   Procedure: ESOPHAGOGASTRODUODENOSCOPY (EGD) WITH PROPOFOL;  Surgeon: Lear Ng, MD;  Location: [facility];  Service: Endoscopy;  Laterality: N/A;  . MITRAL VALVE REPLACEMENT     1973 (bioprosthesis), 1988 (St Jude mechanical vlave) , 2006 (repair due to leak)  . RIGHT HEART CATH N/A 11/24/2018   Procedure: RIGHT HEART CATH;  Surgeon: Jolaine Artist, MD;  Location: [facility];  Service: Cardiovascular;  Laterality: N/A;  . RIGHT HEART CATH N/A 10/23/2019   Procedure: RIGHT HEART CATH;  Surgeon: Jolaine Artist, MD;  Location: [facility];  Service: Cardiovascular;  Laterality: N/A;  . UPPER GASTROINTESTINAL ENDOSCOPY      02-2010 and 05-2014    Family History  Family history unknown: Yes     Social History   Tobacco Use  . Smoking status: Former Smoker    Types: Cigars    Quit date: 09/03/2017    Years since quitting: 2.2  . Smokeless tobacco: Never Used  Vaping Use  . Vaping Use: Never used  Substance Use Topics  . Alcohol use: No  . Drug use: No    Prior to Admission medications   Medication Sig Start Date End Date Taking? Authorizing Provider  enoxaparin (LOVENOX) 60 MG/0.6ML injection Inject 0.6 mLs (60 mg total) into the skin daily. 11/12/19  Yes Belva Crome, MD  ferrous sulfate 325 (65 FE) MG tablet Take 325 mg by mouth every other day. In the evening.   Yes [provider]  furosemide (LASIX) 40 MG tablet Take 1 tablet (40 mg total) by mouth daily. Patient taking differently: Take 40 mg by mouth every evening.  09/18/19  Yes Bensimhon, Shaune Pascal, MD  metolazone (ZAROXOLYN) 2.5 MG tablet Take 1 tablet (2.5 mg total) by mouth daily as needed. Patient taking differently: Take 2.5 mg by mouth daily as needed (severe fluid retention.).  11/24/18  Yes Bensimhon, Shaune Pascal, MD  Multiple Vitamin (MULTIVITAMIN WITH MINERALS) TABS tablet Take 1 tablet by mouth daily.   Yes [provider]  polyethylene glycol (MIRALAX) 17 g packet Take 17 g by  mouth daily as needed. Patient taking differently: Take 17 g by mouth every three (3) days as needed (constipation.).  08/28/19  Yes Cuinn Westerhold, Carlota Raspberry, MD  spironolactone (ALDACTONE) 25 MG tablet Take 0.5 tablets (12.5 mg total) by mouth daily. 09/18/19  Yes Bensimhon, Shaune Pascal, MD  testosterone cypionate (DEPOTESTOSTERONE CYPIONATE) 200 MG/ML injection Inject 60 mg into the muscle every Thursday. 0.3 ml every week 06/08/17  Yes [provider]  warfarin (COUMADIN) 5 MG tablet TAKE 1 TABLET DAILY BY MOUTH FOR 90 DAYS Patient taking differently: Take 2.5-5 mg by mouth See admin instructions. Take 0.5 tablet (2.5 mg) by mouth on Thursdays &  Saturdays, take 1 tablet (5 mg) by mouth on Sundays, Mondays, Tuesdays, Wednesdays, & Fridays. 08/20/19  Yes Anderson, Chelsey L, DO  zolpidem (AMBIEN) 5 MG tablet Take 5-10 mg by mouth at bedtime as needed for sleep.  09/15/18  Yes [provider]  amoxicillin (AMOXIL) 500 MG capsule Take 4 capsules by mouth 1 hour prior to dental appointment Patient taking differently: Take 2,000 mg by mouth See admin instructions. Take 4 capsules (2000 mg) by mouth 1 hour prior to dental appointment 09/06/19   Belva Crome, MD  sildenafil (VIAGRA) 100 MG tablet Take 100 mg by mouth daily as needed (erectile dysfunction).  04/03/18   [provider]    Current Facility-Administered Medications  Medication Dose Route Frequency Provider Last Rate Last Admin  . lactated ringers infusion   Intravenous Continuous Brycin Kille, Carlota Raspberry, MD 20 mL/hr at 11/19/19 0804 New Bag at 11/19/19 0804    Allergies as of 08/28/2019 - Review Complete 08/28/2019  Allergen Reaction Noted  . Penicillins Rash 11/27/2011     Review of Systems:    As per HPI, otherwise negative    Physical Exam:  Vital signs in last 24 hours: Temp:  [97.8 F (36.6 C)] 97.8 F (36.6 C) (08/16 0749) Pulse Rate:  [83] 83 (08/16 0749) Resp:  [16] 16 (08/16 0749) BP: (126)/(74) 126/74 (08/16 0749) SpO2:  [97 %] 97 % (08/16 0749)   General:   Pleasant male in NAD Lungs:  Respirations even and unlabored. Lungs clear to auscultation bilaterally.   Heart:  Regular rate and rhythm Abdomen:  Soft, nondistended, nontender.  Neurologic:  Alert and  oriented x4;  grossly normal neurologically. Psych:  Alert and cooperative. Normal affect.  Lab Results  Component Value Date   INR 2.0 11/12/2019   INR 1.8 (A) 10/26/2019   INR 1.9 (H) 10/23/2019    Lab Results  Component Value Date   CREATININE 2.29 (H) 10/23/2019   BUN 34 (H) 10/23/2019   NA 143 10/23/2019   NA 145 10/23/2019   K 3.6 10/23/2019   K 3.4 (L) 10/23/2019   CL  106 10/23/2019   CO2 24 10/23/2019     Lab Results  Component Value Date   ALT 34 08/28/2019   AST 116 (H) 08/28/2019   ALKPHOS 77 08/28/2019   BILITOT 2.9 (H) 08/28/2019   Lab Results  Component Value Date   WBC 4.4 10/17/2019   HGB 10.2 (L) 10/23/2019   HGB 9.9 (L) 10/23/2019   HCT 30.0 (L) 10/23/2019   HCT 29.0 (L) 10/23/2019   MCV 109.4 (H) 10/17/2019   PLT 95 (L) 10/17/2019      Impression / Plan:   69 y/o male with multiple comorbidities as outlined above including pulm HTN / diastolic dysfunction, on chronic coumadin for history of mitral valve replacement, here for surveillance  colonoscopy given history of colon polyps and screening EGD for varices given history of cirrhosis. I have discussed risks / benefits of endoscopy and anesthesia with him, he wishes to proceed. Case done at the hospital for anesthesia support in light of comorbidities. He has held his coumadin per coumadin clinic recommendations and is on lovenox bridge. All questions answered, further recommendations pending results.  McHenry Cellar, MD [facility]

## 2019-11-19 NOTE — Anesthesia Postprocedure Evaluation (Signed)
Anesthesia Post Note  Patient: [name]  Procedure(s) Performed: ESOPHAGOGASTRODUODENOSCOPY (EGD) WITH PROPOFOL (N/A ) COLONOSCOPY WITH PROPOFOL (N/A ) BIOPSY POLYPECTOMY HEMOSTASIS CLIP PLACEMENT     Patient location during evaluation: PACU Anesthesia Type: MAC Level of consciousness: awake and alert Pain management: pain level controlled Vital Signs Assessment: post-procedure vital signs reviewed and stable Respiratory status: spontaneous breathing, nonlabored ventilation and respiratory function stable Cardiovascular status: blood pressure returned to baseline and stable Postop Assessment: no apparent nausea or vomiting Anesthetic complications: no   No complications documented.  Last Vitals:  Vitals:   11/19/19 0749 11/19/19 0924  BP: 126/74 (!) 105/51  Pulse: 83 74  Resp: 16 19  Temp: 36.6 C (!) 36.1 C  SpO2: 97% 100%    Last Pain:  Vitals:   11/19/19 0924  TempSrc: Axillary  PainSc: [place]

## 2019-11-19 NOTE — Op Note (Signed)
[facility] Patient Name: [name] Procedure Date: 11/19/2019 MRN: [id number] Attending MD: Carlota Raspberry. Havery Moros , MD Date of Birth: May 31, 1950 CSN: [id number] Age: 69 Admit Type: Outpatient Procedure:                Upper GI endoscopy Indications:              Cirrhosis rule out esophageal varices, on Coumadin                            (held for 5 days) and lovenox bridge Providers:                Carlota Raspberry. Havery Moros, MD, Burtis Junes, RN, Laverda Sorenson, Technician, Caryl Pina CRNA Referring MD:              Medicines:                Monitored Anesthesia Care Complications:            No immediate complications. Estimated blood loss:                            Minimal. Estimated Blood Loss:     Estimated blood loss was minimal. Procedure:                Pre-Anesthesia Assessment:                           - Prior to the procedure, a History and Physical                            was performed, and patient medications and                            allergies were reviewed. The patient's tolerance of                            previous anesthesia was also reviewed. The risks                            and benefits of the procedure and the sedation                            options and risks were discussed with the patient.                            All questions were answered, and informed consent                            was obtained. Prior Anticoagulants: The patient has                            taken Lovenox (enoxaparin), last dose was 1 day  prior to procedure. ASA Grade Assessment: III - A                            patient with severe systemic disease. After                            reviewing the risks and benefits, the patient was                            deemed in satisfactory condition to undergo the                            procedure.                           After obtaining informed  consent, the endoscope was                            passed under direct vision. Throughout the                            procedure, the patient's blood pressure, pulse, and                            oxygen saturations were monitored continuously. The                            GIF-H190 ([id number] Olympus gastroscope was                            introduced through the mouth, and advanced to the                            second part of duodenum. The upper GI endoscopy was                            accomplished without difficulty. The patient                            tolerated the procedure well. Scope In: Scope Out: Findings:      The examined esophagus was normal. No esophageal varices.      Multiple small sessile polyps were found in the gastric antrum. Biopsy       was taken with a cold forceps for histology from one of these as a       representative sample.      The exam of the stomach was otherwise normal. No gastric varices      A single 7 to 8 mm sessile polypoid lesion vs. normal variant (brunner's       gland hyperplasia) was found in the duodenal bulb. One small biopsy was       taken with a cold forceps for histology.      The exam of the duodenum was otherwise normal. Impression:               - Normal esophagus. No esophageal varices                           -  No gastric varices                           - Multiple benign appearing gastric polyps.                            Biopsied.                           - A single benign appearing duodenal polypoid                            lesion vs. normal variant. Biopsied. Moderate Sedation:      No moderate sedation, case performed with MAC Recommendation:           - Patient has a contact number available for                            emergencies. The signs and symptoms of potential                            delayed complications were discussed with the                            patient. Return to normal  activities tomorrow.                            Written discharge instructions were provided to the                            patient.                           - Resume previous diet.                           - Continue present medications (resume Coumadin                            tonight, and lovenox resume tomorrow AM)                           - Await pathology results.                           - Repeat EGD in 3 years for varices screening Procedure Code(s):        --- Professional ---                           [phone], Esophagogastroduodenoscopy, flexible,                            transoral; with biopsy, single or multiple Diagnosis Code(s):        --- Professional ---                           K31.7, Polyp of stomach  and duodenum                           K74.60, Unspecified cirrhosis of liver CPT copyright 2019 [employer]. All rights reserved. The codes documented in this report are preliminary and upon coder review may  be revised to meet current compliance requirements. Remo Lipps P. Dwanda Tufano, MD 11/19/2019 9:27:22 AM This report has been signed electronically. Number of Addenda: 0

## 2019-11-19 NOTE — Transfer of Care (Signed)
Immediate Anesthesia Transfer of Care Note  Patient: MADDON HORTON  Procedure(s) Performed: ESOPHAGOGASTRODUODENOSCOPY (EGD) WITH PROPOFOL (N/A ) COLONOSCOPY WITH PROPOFOL (N/A ) BIOPSY POLYPECTOMY HEMOSTASIS CLIP PLACEMENT  Patient Location: PACU  Anesthesia Type:MAC  Level of Consciousness: awake, alert  and oriented  Airway & Oxygen Therapy: Patient Spontanous Breathing and Patient connected to face mask oxygen  Post-op Assessment: Report given to RN and Post -op Vital signs reviewed and stable  Post vital signs: Reviewed and stable  Last Vitals:  Vitals Value Taken Time  BP 105/51 11/19/19 0923  Temp    Pulse 73 11/19/19 0925  Resp 19 11/19/19 0925  SpO2 100 % 11/19/19 0925  Vitals shown include unvalidated device data.  Last Pain:  Vitals:   11/19/19 0749  TempSrc: Oral  PainSc: 4          Complications: No complications documented.

## 2019-11-19 NOTE — Op Note (Addendum)
[facility] Patient Name: [name] Procedure Date: 11/19/2019 MRN: [id number] Attending MD: Carlota Raspberry. Havery Moros , MD Date of Birth: 14-Feb-1951 CSN: [id number] Age: 69 Admit Type: Outpatient Procedure:                Colonoscopy Indications:              High risk colon cancer surveillance: Personal                            history of colonic polyps (advanced adenoma 2014),                            on coumadin - last dose 8/10, lovenox bridge last                            dose PM of 8/14 Providers:                Carlota Raspberry. Havery Moros, MD, Burtis Junes, RN, Laverda Sorenson, Technician, Caryl Pina CRNA Referring MD:              Medicines:                Monitored Anesthesia Care Complications:            No immediate complications. Estimated blood loss:                            Minimal. Estimated Blood Loss:     Estimated blood loss was minimal. Procedure:                Pre-Anesthesia Assessment:                           - Prior to the procedure, a History and Physical                            was performed, and patient medications and                            allergies were reviewed. The patient's tolerance of                            previous anesthesia was also reviewed. The risks                            and benefits of the procedure and the sedation                            options and risks were discussed with the patient.                            All questions were answered, and informed consent  was obtained. Prior Anticoagulants: The patient has                            taken Lovenox (enoxaparin), last dose was 1 day                            prior to procedure. ASA Grade Assessment: III - A                            patient with severe systemic disease. After                            reviewing the risks and benefits, the patient was                            deemed in  satisfactory condition to undergo the                            procedure.                           After obtaining informed consent, the colonoscope                            was passed under direct vision. Throughout the                            procedure, the patient's blood pressure, pulse, and                            oxygen saturations were monitored continuously. The                            CF-HQ190L ([id number] Olympus colonoscope was                            introduced through the anus and advanced to the the                            cecum, identified by appendiceal orifice and                            ileocecal valve. The colonoscopy was performed                            without difficulty. The patient tolerated the                            procedure well. The quality of the bowel                            preparation was adequate. The ileocecal valve,  appendiceal orifice, and rectum were photographed. Scope In: 8:44:54 AM Scope Out: 9:12:14 AM Scope Withdrawal Time: 0 hours 23 minutes 35 seconds  Total Procedure Duration: 0 hours 27 minutes 20 seconds  Findings:      The perianal and digital rectal examinations were normal.      A few small-mouthed diverticula were found in the sigmoid colon.      A 4 to 5 mm polyp was found in the ascending colon. The polyp was       sessile. The polyp was removed with a cold snare. Resection and       retrieval were complete. (I thought a photo of it was obtained but       unfortunately was not)      A diminutive polyp was found in the sigmoid colon. The polyp was       sessile. The polyp was removed with a cold biopsy forceps. Resection and       retrieval were complete.      A 6 mm polyp was found in the sigmoid colon. The polyp was pedunculated.       The polyp was removed with a hot snare. Resection and retrieval were       complete. There was some oozing at the site post polypectomy, one        hemostatic clip was successfully placed across the polypectomy site      The exam was otherwise without abnormality. The rectum was small,       retroflexed views not obtained (mild endoscopic trauma noted during       initial attempt with superficial slight oozing which resolved with       observation) Impression:               - Diverticulosis in the sigmoid colon.                           - One 4 to 5 mm polyp in the ascending colon,                            removed with a cold snare. Resected and retrieved.                           - One diminutive polyp in the sigmoid colon,                            removed with a cold biopsy forceps. Resected and                            retrieved.                           - One 6 mm polyp in the sigmoid colon, removed with                            a hot snare. Resected and retrieved. Clip was                            placed.                           -  The examination was otherwise normal. Moderate Sedation:      No moderate sedation, case performed with MAC Recommendation:           - Patient has a contact number available for                            emergencies. The signs and symptoms of potential                            delayed complications were discussed with the                            patient. Return to normal activities tomorrow.                            Written discharge instructions were provided to the                            patient.                           - Resume previous diet.                           - Continue present medications.                           - Resume Comadin tonight, and resume Lovenox                            tomorrow AM, call anticoagulation clinic for                            recommendations on further dosing for lovenox                            (duration)                           - Await pathology results. Procedure Code(s):        --- Professional ---                            [phone], Colonoscopy, flexible; with removal of                            tumor(s), polyp(s), or other lesion(s) by snare                            technique                           09735, 13, Colonoscopy, flexible; with biopsy,                            single or multiple Diagnosis Code(s):        --- Professional ---  Z86.010, Personal history of colonic polyps                           K63.5, Polyp of colon                           K57.30, Diverticulosis of large intestine without                            perforation or abscess without bleeding CPT copyright 2019 [employer]. All rights reserved. The codes documented in this report are preliminary and upon coder review may  be revised to meet current compliance requirements. Remo Lipps P. Korinna Tat, MD 11/19/2019 9:21:02 AM This report has been signed electronically. Number of Addenda: 0

## 2019-11-19 NOTE — Interval H&P Note (Signed)
History and Physical Interval Note:  11/19/2019 8:26 AM  [name]  has presented today for surgery, with the diagnosis of Cirrhosis, Hx of colon polyps, CHF, anticoagulant therapy.  The various methods of treatment have been discussed with the patient and family. After consideration of risks, benefits and other options for treatment, the patient has consented to  Procedure(s): ESOPHAGOGASTRODUODENOSCOPY (EGD) WITH PROPOFOL (N/A) COLONOSCOPY WITH PROPOFOL (N/A) as a surgical intervention.  The patient's history has been reviewed, patient examined, no change in status, stable for surgery.  I have reviewed the patient's chart and labs.  Questions were answered to the patient's satisfaction.     [place]

## 2019-11-19 NOTE — Discharge Instructions (Signed)
MEDICATIONS: RESUME COUMADIN TONIGHT (11/19/19). RESUME LOVENOX TOMORROW MORNING (11/20/19). CALL COUMADIN CLINIC TOMORROW FOR RECOMMENDATIONS ON FURTHER DOSING OF LOVENOX (HOW MUCH LONGER TO TAKE).  YOU HAD AN ENDOSCOPIC PROCEDURE TODAY: Refer to the procedure report and other information in the discharge instructions given to you for any specific questions about what was found during the examination. If this information does not answer your questions, please call [place] office at [phone] to clarify.   YOU SHOULD EXPECT: Some feelings of bloating in the abdomen. Passage of more gas than usual. Walking can help get rid of the air that was put into your GI tract during the procedure and reduce the bloating. If you had a lower endoscopy (such as a colonoscopy or flexible sigmoidoscopy) you may notice spotting of blood in your stool or on the toilet paper. Some abdominal soreness may be present for a day or two, also.  DIET: Your first meal following the procedure should be a light meal and then it is ok to progress to your normal diet. A half-sandwich or bowl of soup is an example of a good first meal. Heavy or fried foods are harder to digest and may make you feel nauseous or bloated. Drink plenty of fluids but you should avoid alcoholic beverages for 24 hours. If you had a esophageal dilation, please see attached instructions for diet.    ACTIVITY: Your care partner should take you home directly after the procedure. You should plan to take it easy, moving slowly for the rest of the day. You can resume normal activity the day after the procedure however YOU SHOULD NOT DRIVE, use power tools, machinery or perform tasks that involve climbing or major physical exertion for 24 hours (because of the sedation medicines used during the test).   SYMPTOMS TO REPORT IMMEDIATELY: A gastroenterologist can be reached at any hour. Please call [phone]  for any of the following symptoms:  Following lower  endoscopy (colonoscopy, flexible sigmoidoscopy) Excessive amounts of blood in the stool  Significant tenderness, worsening of abdominal pains  Swelling of the abdomen that is new, acute  Fever of 100 or higher  Following upper endoscopy (EGD, EUS, ERCP, esophageal dilation) Vomiting of blood or coffee ground material  New, significant abdominal pain  New, significant chest pain or pain under the shoulder blades  Painful or persistently difficult swallowing  New shortness of breath  Black, tarry-looking or red, bloody stools  FOLLOW UP:  If any biopsies were taken you will be contacted by phone or by letter within the next 1-3 weeks. Call [phone]  if you have not heard about the biopsies in 3 weeks.  Please also call with any specific questions about appointments or follow up tests.

## 2019-11-20 ENCOUNTER — Other Ambulatory Visit: Payer: Self-pay

## 2019-11-20 LAB — SURGICAL PATHOLOGY

## 2019-11-21 ENCOUNTER — Encounter (HOSPITAL_COMMUNITY): Payer: Self-pay | Admitting: Gastroenterology

## 2019-11-22 ENCOUNTER — Telehealth: Payer: Self-pay | Admitting: Gastroenterology

## 2019-11-22 NOTE — Telephone Encounter (Signed)
Thanks [employer]. Hopefully this is self limited small amount and resolves on its own. He is on coumadin and was also taking a lovenox bridge, not sure when he is stopping his lovenox? Hopefully soon. Can you let me know how he is doing in the AM. If this resolves or only scant bleeding would continue to monitor, if any significant / worsening bleeding he will need to be evaluated. Thanks

## 2019-11-22 NOTE — Telephone Encounter (Signed)
Patient s/p colon/endo on 8/16 with multiple polypectomy.  He reports he resumed his coumadin on 8/17.  Today had a loose BM with blood in the stool.  Not a large amount and only one episode.  He denies any SOB, dizziness, CP or other complaints with the exception of some pedal edema. "I feel fine"  He will rest this pm.  He is advised that he will need to call and report more bleeding if large amounts or blood independent of stool.  If he has large amount of bleeding will need ER for eval.  I discussed I will call him in the am to check on him and will let Dr. Havery Moros know about bleeding episode.

## 2019-11-23 NOTE — Telephone Encounter (Signed)
Patient contacted this am. He reports his doing well. No further bleeding.  Tolerating a normal diet.  He understands to contact us if his symptoms change.

## 2019-11-23 NOTE — Telephone Encounter (Signed)
Okay thanks [employer]

## 2019-11-25 ENCOUNTER — Telehealth: Payer: Self-pay | Admitting: Internal Medicine

## 2019-11-25 DIAGNOSIS — K625 Hemorrhage of anus and rectum: Secondary | ICD-10-CM

## 2019-11-25 NOTE — Telephone Encounter (Signed)
Patient called today to report 3 episodes of blood in stool occurring this morning.  He had been in touch with the office on 11/22/2019 with the same. He describes dark stool but with thin red blood associated with bowel movement No abdominal pain No presyncopal symptoms  EGD and colonoscopy on 11/19/2019.  Gastric and duodenal biopsies.  Colonoscopic polypectomy with a hot snare from the sigmoid Endo Clip x1  He has been on the Lovenox bridge with the last dose scheduled for tomorrow.  He has already resumed warfarin.  He made the decision to not take the Lovenox injection today, he has been using it once daily.  I made him aware that if he has further bleeding which continues throughout the day then he needs to be seen in the ER for evaluation.  He hopes to avoid this but understands the recommendation.  I would like him to come to the office tomorrow for CBC and INR (I put these orders in) I instructed him that our nurse would check on him first thing in the morning I will send this to alert Dr. Havery Moros as well

## 2019-11-26 ENCOUNTER — Emergency Department (HOSPITAL_COMMUNITY): Payer: Medicare Other

## 2019-11-26 ENCOUNTER — Other Ambulatory Visit: Payer: Self-pay

## 2019-11-26 ENCOUNTER — Encounter (HOSPITAL_COMMUNITY): Payer: Self-pay

## 2019-11-26 ENCOUNTER — Inpatient Hospital Stay (HOSPITAL_COMMUNITY)
Admission: EM | Admit: 2019-11-26 | Discharge: 2019-11-29 | DRG: 919 | Disposition: A | Discharge status: Home / Self Care | Payer: Medicare Other | Attending: Family Medicine | Admitting: Family Medicine

## 2019-11-26 DIAGNOSIS — D689 Coagulation defect, unspecified: Secondary | ICD-10-CM

## 2019-11-26 DIAGNOSIS — I50812 Chronic right heart failure: Secondary | ICD-10-CM | POA: Diagnosis not present

## 2019-11-26 DIAGNOSIS — K7469 Other cirrhosis of liver: Secondary | ICD-10-CM

## 2019-11-26 DIAGNOSIS — Z7901 Long term (current) use of anticoagulants: Secondary | ICD-10-CM | POA: Diagnosis not present

## 2019-11-26 DIAGNOSIS — K91840 Postprocedural hemorrhage and hematoma of a digestive system organ or structure following a digestive system procedure: Principal | ICD-10-CM | POA: Diagnosis present

## 2019-11-26 DIAGNOSIS — D696 Thrombocytopenia, unspecified: Secondary | ICD-10-CM | POA: Diagnosis not present

## 2019-11-26 DIAGNOSIS — E43 Unspecified severe protein-calorie malnutrition: Secondary | ICD-10-CM

## 2019-11-26 DIAGNOSIS — Y838 Other surgical procedures as the cause of abnormal reaction of the patient, or of later complication, without mention of misadventure at the time of the procedure: Secondary | ICD-10-CM | POA: Diagnosis present

## 2019-11-26 DIAGNOSIS — Z952 Presence of prosthetic heart valve: Secondary | ICD-10-CM

## 2019-11-26 DIAGNOSIS — N189 Chronic kidney disease, unspecified: Secondary | ICD-10-CM

## 2019-11-26 DIAGNOSIS — Z681 Body mass index (BMI) 19 or less, adult: Secondary | ICD-10-CM | POA: Diagnosis not present

## 2019-11-26 DIAGNOSIS — K921 Melena: Secondary | ICD-10-CM

## 2019-11-26 DIAGNOSIS — I5032 Chronic diastolic (congestive) heart failure: Secondary | ICD-10-CM | POA: Diagnosis present

## 2019-11-26 DIAGNOSIS — E876 Hypokalemia: Secondary | ICD-10-CM

## 2019-11-26 DIAGNOSIS — D62 Acute posthemorrhagic anemia: Secondary | ICD-10-CM | POA: Diagnosis present

## 2019-11-26 DIAGNOSIS — T45515A Adverse effect of anticoagulants, initial encounter: Secondary | ICD-10-CM | POA: Diagnosis present

## 2019-11-26 DIAGNOSIS — N184 Chronic kidney disease, stage 4 (severe): Secondary | ICD-10-CM | POA: Diagnosis present

## 2019-11-26 DIAGNOSIS — D649 Anemia, unspecified: Secondary | ICD-10-CM | POA: Diagnosis not present

## 2019-11-26 DIAGNOSIS — D6959 Other secondary thrombocytopenia: Secondary | ICD-10-CM | POA: Diagnosis present

## 2019-11-26 DIAGNOSIS — K633 Ulcer of intestine: Secondary | ICD-10-CM | POA: Diagnosis present

## 2019-11-26 DIAGNOSIS — R54 Age-related physical debility: Secondary | ICD-10-CM | POA: Diagnosis present

## 2019-11-26 DIAGNOSIS — K7460 Unspecified cirrhosis of liver: Secondary | ICD-10-CM | POA: Diagnosis present

## 2019-11-26 DIAGNOSIS — I130 Hypertensive heart and chronic kidney disease with heart failure and stage 1 through stage 4 chronic kidney disease, or unspecified chronic kidney disease: Secondary | ICD-10-CM | POA: Diagnosis present

## 2019-11-26 DIAGNOSIS — I480 Paroxysmal atrial fibrillation: Secondary | ICD-10-CM | POA: Diagnosis present

## 2019-11-26 DIAGNOSIS — I959 Hypotension, unspecified: Secondary | ICD-10-CM | POA: Diagnosis present

## 2019-11-26 DIAGNOSIS — N179 Acute kidney failure, unspecified: Secondary | ICD-10-CM | POA: Diagnosis present

## 2019-11-26 DIAGNOSIS — I10 Essential (primary) hypertension: Secondary | ICD-10-CM | POA: Diagnosis not present

## 2019-11-26 DIAGNOSIS — Z87891 Personal history of nicotine dependence: Secondary | ICD-10-CM | POA: Diagnosis not present

## 2019-11-26 DIAGNOSIS — N400 Enlarged prostate without lower urinary tract symptoms: Secondary | ICD-10-CM | POA: Diagnosis present

## 2019-11-26 DIAGNOSIS — I719 Aortic aneurysm of unspecified site, without rupture: Secondary | ICD-10-CM | POA: Diagnosis present

## 2019-11-26 DIAGNOSIS — B1920 Unspecified viral hepatitis C without hepatic coma: Secondary | ICD-10-CM | POA: Diagnosis present

## 2019-11-26 DIAGNOSIS — I872 Venous insufficiency (chronic) (peripheral): Secondary | ICD-10-CM | POA: Diagnosis present

## 2019-11-26 DIAGNOSIS — K922 Gastrointestinal hemorrhage, unspecified: Secondary | ICD-10-CM | POA: Diagnosis not present

## 2019-11-26 DIAGNOSIS — Z20822 Contact with and (suspected) exposure to covid-19: Secondary | ICD-10-CM | POA: Diagnosis present

## 2019-11-26 DIAGNOSIS — D7589 Other specified diseases of blood and blood-forming organs: Secondary | ICD-10-CM

## 2019-11-26 DIAGNOSIS — R7989 Other specified abnormal findings of blood chemistry: Secondary | ICD-10-CM

## 2019-11-26 DIAGNOSIS — D6832 Hemorrhagic disorder due to extrinsic circulating anticoagulants: Secondary | ICD-10-CM | POA: Diagnosis present

## 2019-11-26 DIAGNOSIS — Z880 Allergy status to penicillin: Secondary | ICD-10-CM

## 2019-11-26 DIAGNOSIS — I272 Pulmonary hypertension, unspecified: Secondary | ICD-10-CM | POA: Diagnosis present

## 2019-11-26 LAB — CBC
HCT: 23.8 % — ABNORMAL LOW (ref 39.0–52.0)
Hemoglobin: 7.5 g/dL — ABNORMAL LOW (ref 13.0–17.0)
MCH: 32.5 pg (ref 26.0–34.0)
MCHC: 31.5 g/dL (ref 30.0–36.0)
MCV: 103 fL — ABNORMAL HIGH (ref 80.0–100.0)
Platelets: 90 10*3/uL — ABNORMAL LOW (ref 150–400)
RBC: 2.31 MIL/uL — ABNORMAL LOW (ref 4.22–5.81)
RDW: 25.3 % — ABNORMAL HIGH (ref 11.5–15.5)
WBC: 6.3 10*3/uL (ref 4.0–10.5)
nRBC: 0 % (ref 0.0–0.2)

## 2019-11-26 LAB — IRON AND TIBC
Iron: 75 ug/dL (ref 45–182)
Saturation Ratios: 27 % (ref 17.9–39.5)
TIBC: 283 ug/dL (ref 250–450)
UIBC: 208 ug/dL

## 2019-11-26 LAB — RETICULOCYTES
Immature Retic Fract: 27.7 % — ABNORMAL HIGH (ref 2.3–15.9)
RBC.: 2.26 MIL/uL — ABNORMAL LOW (ref 4.22–5.81)
Retic Count, Absolute: 179.4 10*3/uL (ref 19.0–186.0)
Retic Ct Pct: 7.9 % — ABNORMAL HIGH (ref 0.4–3.1)

## 2019-11-26 LAB — COMPREHENSIVE METABOLIC PANEL
ALT: 36 U/L (ref 0–44)
AST: 112 U/L — ABNORMAL HIGH (ref 15–41)
Albumin: 3.9 g/dL (ref 3.5–5.0)
Alkaline Phosphatase: 45 U/L (ref 38–126)
Anion gap: 12 (ref 5–15)
BUN: 36 mg/dL — ABNORMAL HIGH (ref 8–23)
CO2: 25 mmol/L (ref 22–32)
Calcium: 9.1 mg/dL (ref 8.9–10.3)
Chloride: 101 mmol/L (ref 98–111)
Creatinine, Ser: 2.69 mg/dL — ABNORMAL HIGH (ref 0.61–1.24)
GFR calc Af Amer: 27 mL/min — ABNORMAL LOW (ref >60)
GFR calc non Af Amer: 23 mL/min — ABNORMAL LOW (ref >60)
Glucose, Bld: 106 mg/dL — ABNORMAL HIGH (ref 70–99)
Potassium: 3.2 mmol/L — ABNORMAL LOW (ref 3.5–5.1)
Sodium: 138 mmol/L (ref 135–145)
Total Bilirubin: 2.8 mg/dL — ABNORMAL HIGH (ref 0.3–1.2)
Total Protein: 7.8 g/dL (ref 6.5–8.1)

## 2019-11-26 LAB — VITAMIN B12: Vitamin B-12: 728 pg/mL (ref 180–914)

## 2019-11-26 LAB — CBC WITH DIFFERENTIAL/PLATELET
Abs Immature Granulocytes: 0.02 10*3/uL (ref 0.00–0.07)
Basophils Absolute: 0.1 10*3/uL (ref 0.0–0.1)
Basophils Relative: 1 %
Eosinophils Absolute: 0.2 10*3/uL (ref 0.0–0.5)
Eosinophils Relative: 4 %
HCT: 26.7 % — ABNORMAL LOW (ref 39.0–52.0)
Hemoglobin: 8.4 g/dL — ABNORMAL LOW (ref 13.0–17.0)
Immature Granulocytes: 0 %
Lymphocytes Relative: 14 %
Lymphs Abs: 0.9 10*3/uL (ref 0.7–4.0)
MCH: 33.6 pg (ref 26.0–34.0)
MCHC: 31.5 g/dL (ref 30.0–36.0)
MCV: 106.8 fL — ABNORMAL HIGH (ref 80.0–100.0)
Monocytes Absolute: 0.7 10*3/uL (ref 0.1–1.0)
Monocytes Relative: 12 %
Neutro Abs: 4 10*3/uL (ref 1.7–7.7)
Neutrophils Relative %: 69 %
Platelets: 139 10*3/uL — ABNORMAL LOW (ref 150–400)
RBC: 2.5 MIL/uL — ABNORMAL LOW (ref 4.22–5.81)
RDW: 24.6 % — ABNORMAL HIGH (ref 11.5–15.5)
WBC: 5.9 10*3/uL (ref 4.0–10.5)
nRBC: 0 % (ref 0.0–0.2)

## 2019-11-26 LAB — FOLATE: Folate: 45.4 ng/mL (ref >5.9)

## 2019-11-26 LAB — I-STAT CHEM 8, ED
BUN: 36 mg/dL — ABNORMAL HIGH (ref 8–23)
Calcium, Ion: 1.12 mmol/L — ABNORMAL LOW (ref 1.15–1.40)
Chloride: 99 mmol/L (ref 98–111)
Creatinine, Ser: 2.7 mg/dL — ABNORMAL HIGH (ref 0.61–1.24)
Glucose, Bld: 102 mg/dL — ABNORMAL HIGH (ref 70–99)
HCT: 27 % — ABNORMAL LOW (ref 39.0–52.0)
Hemoglobin: 9.2 g/dL — ABNORMAL LOW (ref 13.0–17.0)
Potassium: 3.2 mmol/L — ABNORMAL LOW (ref 3.5–5.1)
Sodium: 140 mmol/L (ref 135–145)
TCO2: 25 mmol/L (ref 22–32)

## 2019-11-26 LAB — FERRITIN: Ferritin: 56 ng/mL (ref 24–336)

## 2019-11-26 LAB — SARS CORONAVIRUS 2 BY RT PCR (HOSPITAL ORDER, PERFORMED IN ~~LOC~~ HOSPITAL LAB): SARS Coronavirus 2: NEGATIVE

## 2019-11-26 LAB — PROTIME-INR
INR: 2.1 — ABNORMAL HIGH (ref 0.8–1.2)
Prothrombin Time: 22.4 seconds — ABNORMAL HIGH (ref 11.4–15.2)

## 2019-11-26 LAB — PREPARE RBC (CROSSMATCH)

## 2019-11-26 MED ORDER — OXYCODONE HCL 5 MG PO TABS: 5.0000 mg | ORAL_TABLET | ORAL | Status: DC | PRN

## 2019-11-26 MED ORDER — PEG-KCL-NACL-NASULF-NA ASC-C 100 G PO SOLR: 0.5000 | Freq: Once | ORAL | Status: DC

## 2019-11-26 MED ORDER — PEG-KCL-NACL-NASULF-NA ASC-C 100 G PO SOLR
0.5000 | Freq: Once | ORAL | Status: AC
  Administered 2019-11-26: 100 g via ORAL
  Filled 2019-11-26: qty 1

## 2019-11-26 MED ORDER — ACETAMINOPHEN 325 MG PO TABS
650.0000 mg | ORAL_TABLET | Freq: Four times a day (QID) | ORAL | Status: DC | PRN
  Administered 2019-11-29: 650 mg via ORAL
  Filled 2019-11-26: qty 2

## 2019-11-26 MED ORDER — ACETAMINOPHEN 650 MG RE SUPP: 650.0000 mg | Freq: Four times a day (QID) | RECTAL | Status: DC | PRN

## 2019-11-26 MED ORDER — SODIUM CHLORIDE 0.9 % IV SOLN: INTRAVENOUS | Status: DC

## 2019-11-26 MED ORDER — SODIUM CHLORIDE 0.9 % IV BOLUS
250.0000 mL | Freq: Once | INTRAVENOUS | Status: AC
  Administered 2019-11-26: 250 mL via INTRAVENOUS

## 2019-11-26 MED ORDER — SODIUM CHLORIDE 0.9% IV SOLUTION: Freq: Once | INTRAVENOUS | Status: DC

## 2019-11-26 MED ORDER — ONDANSETRON HCL 4 MG PO TABS: 4.0000 mg | ORAL_TABLET | Freq: Four times a day (QID) | ORAL | Status: DC | PRN

## 2019-11-26 MED ORDER — PEG-KCL-NACL-NASULF-NA ASC-C 100 G PO SOLR: 1.0000 | Freq: Once | ORAL | Status: DC

## 2019-11-26 MED ORDER — SODIUM CHLORIDE 0.9% IV SOLUTION: Freq: Once | INTRAVENOUS | Status: AC

## 2019-11-26 MED ORDER — ONDANSETRON HCL 4 MG/2ML IJ SOLN: 4.0000 mg | Freq: Four times a day (QID) | INTRAMUSCULAR | Status: DC | PRN

## 2019-11-26 MED ORDER — POTASSIUM CHLORIDE 10 MEQ/100ML IV SOLN
10.0000 meq | INTRAVENOUS | Status: AC
  Administered 2019-11-26 (×2): 10 meq via INTRAVENOUS
  Filled 2019-11-26 (×2): qty 100

## 2019-11-26 NOTE — H&P (View-Only) (Signed)
Referring Provider:  [facility]         Primary Care Physician:  [name], DO Primary Gastroenterologist: Jolly Mango MD            We were asked to see this patient for:    GI bleeding              ASSESSMENT /  PLAN     [name] is a 69 y.o. male PMH significant for, but not necessarily limited to,   AFIB  on warfarin, hx of rheumatic heart disease s/p mechanical valve replacement, chronic diastolic heart failure, pulmonary HTN, ascending aortic aneurysm, CKD, cirrhosis, colon polyps, HCV                                                                                                                                 # GI bleed, (hematochezia) with hypotension on anticoagulation. Probably post-polypectomy bleed.  --would not give FFP or Vit K given that he has a prosthetic mitral valve. Will need to let INR drift though this does increase his risk for ongoing bleeding.   --Suspect bleeding from sigmoid polyp removed on 8/16. Will prep today for am colonoscopy. He had small gastric biopsies so in the unlikely event that colonoscopy is negative will consent for EGD --Q 6 hours CBC.   # ABL anemia --Hgb 8.4, down from baseline of 10.2 --a unit of blood has already been ordered.   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   --INR 2.1- -medication induced coagulopathy (see attending addendum below)  # Cirrhosis, compensated.    HPI:    Chief Complaint: passing blood in stool  [name] is a 69 y.o. male with history of a mechanical valve replacement on chronic warfarin who underwent EGD for varices screening and a polyp surveillance colonoscopy on Monday 11/19/19. In preparations for the procedures he was given a lovenox bridge and his warfarin was held. A couple of gastric polyps were biopsied and a couple of colon polyps were removed,  including a 6 mm one in the sigmoid colon. The following day patient resumed warfarin and the Lovenox  was continued. On that day had some self-limited rectal bleeding. No further bleeding on Wed or Thurs but by Friday the bleeding had restarted. Patient spoke with our on-call- provider on Saturday. Upon our recommendation he did not take warfarin nor Lovenox on Saturday. Unfortunately the bleeding has continued and he has since had several painless episodes of rectal bleeding ( bright red blood). Patient spoke with our office today, we recommended ED evaluation. In ED patient has been hypotensive. He admits to dizziness and SHOB. Baseline hgb is 10.2, it is down to 8.4. BP has improved with IV fluids  PREVIOUS ENDOSCOPIC EVALUATIONS / GI STUDIES :  11/19/19 colonoscopy - Diverticulosis in the sigmoid colon. - One 4 to 5 mm polyp in the ascending colon, removed with a cold  snare. Resected and retrieved. - One diminutive polyp in the sigmoid colon, removed with a cold biopsy forceps. Resected and retrieved. - One 6 mm polyp in the sigmoid colon, removed with a hot snare. Resected and retrieved. Clip was placed.  11/19/19 EGD for varices screening -Normal esophagus. No esophageal varices - No gastric varices - Multiple benign appearing gastric polyps. Biopsied. - A single benign appearing duodenal polypoid lesion vs. normal variant. Biopsied.  Past Medical History:  Diagnosis Date  . Allergy   . Aneurysm of ascending aorta (HCC)   . Atrial fibrillation ([place])   . BPH (benign prostatic hyperplasia)   . Colon polyps   . FHx: rheumatic heart disease   . H/O diplopia   . Hepatitis C   . Hypertension   . Hypogonadism male   . Mitral valve disease    mitral valve repair/replacement x3    Past Surgical History:  Procedure Laterality Date  . BIOPSY  11/19/2019   Procedure: BIOPSY;  Surgeon: Yetta Flock, MD;  Location: [facility];  Service: Gastroenterology;;  EGD and COLON  . COLON SURGERY    . COLONOSCOPY    . COLONOSCOPY N/A 11/14/2012   Procedure: COLONOSCOPY;  Surgeon:  Lear Ng, MD;  Location: [facility];  Service: Endoscopy;  Laterality: N/A;  . COLONOSCOPY WITH PROPOFOL N/A 11/19/2019   Procedure: COLONOSCOPY WITH PROPOFOL;  Surgeon: Yetta Flock, MD;  Location: [facility];  Service: Gastroenterology;  Laterality: N/A;  . ESOPHAGOGASTRODUODENOSCOPY (EGD) WITH PROPOFOL N/A 05/08/2014   Procedure: ESOPHAGOGASTRODUODENOSCOPY (EGD) WITH PROPOFOL;  Surgeon: Lear Ng, MD;  Location: [place];  Service: Endoscopy;  Laterality: N/A;  . ESOPHAGOGASTRODUODENOSCOPY (EGD) WITH PROPOFOL N/A 11/19/2019   Procedure: ESOPHAGOGASTRODUODENOSCOPY (EGD) WITH PROPOFOL;  Surgeon: Yetta Flock, MD;  Location: [facility];  Service: Gastroenterology;  Laterality: N/A;  . HEMOSTASIS CLIP PLACEMENT  11/19/2019   Procedure: HEMOSTASIS CLIP PLACEMENT;  Surgeon: Yetta Flock, MD;  Location: [facility];  Service: Gastroenterology;;  . MITRAL VALVE REPLACEMENT     1973 (bioprosthesis), 1988 ([employer] Jude mechanical vlave) , 2006 (repair due to leak)  . POLYPECTOMY  11/19/2019   Procedure: POLYPECTOMY;  Surgeon: Yetta Flock, MD;  Location: [facility];  Service: Gastroenterology;;  . RIGHT HEART CATH N/A 11/24/2018   Procedure: RIGHT HEART CATH;  Surgeon: Jolaine Artist, MD;  Location: [place] [facility];  Service: Cardiovascular;  Laterality: N/A;  . RIGHT HEART CATH N/A 10/23/2019   Procedure: RIGHT HEART CATH;  Surgeon: Jolaine Artist, MD;  Location: [place] [facility];  Service: Cardiovascular;  Laterality: N/A;  . UPPER GASTROINTESTINAL ENDOSCOPY     02-2010 and 05-2014    Prior to Admission medications   Medication Sig Start Date End Date Taking? Authorizing Provider  amoxicillin (AMOXIL) 500 MG capsule Take 4 capsules by mouth 1 hour prior to dental appointment Patient taking differently: Take 2,000 mg by mouth See admin instructions. Take 4 capsules (2000 mg) by mouth 1 hour prior to dental appointment 09/06/19    Belva Crome, MD  enoxaparin (LOVENOX) 60 MG/0.6ML injection Inject 0.6 mLs (60 mg total) into the skin daily. 11/12/19   Belva Crome, MD  ferrous sulfate 325 (65 FE) MG tablet Take 325 mg by mouth every other day. In the evening.    [provider]  furosemide (LASIX) 40 MG tablet Take 1 tablet (40 mg total) by mouth daily. Patient taking differently: Take 40 mg by mouth every evening.  09/18/19  Bensimhon, Shaune Pascal, MD  metolazone (ZAROXOLYN) 2.5 MG tablet Take 1 tablet (2.5 mg total) by mouth daily as needed. Patient taking differently: Take 2.5 mg by mouth daily as needed (severe fluid retention.).  11/24/18   Bensimhon, Shaune Pascal, MD  Multiple Vitamin (MULTIVITAMIN WITH MINERALS) TABS tablet Take 1 tablet by mouth daily.    [provider]  polyethylene glycol (MIRALAX) 17 g packet Take 17 g by mouth daily as needed. Patient taking differently: Take 17 g by mouth every three (3) days as needed (constipation.).  08/28/19   Armbruster, Carlota Raspberry, MD  sildenafil (VIAGRA) 100 MG tablet Take 100 mg by mouth daily as needed (erectile dysfunction).  04/03/18   [provider]  spironolactone (ALDACTONE) 25 MG tablet Take 0.5 tablets (12.5 mg total) by mouth daily. 09/18/19   Bensimhon, Shaune Pascal, MD  testosterone cypionate (DEPOTESTOSTERONE CYPIONATE) 200 MG/ML injection Inject 60 mg into the muscle every Thursday. 0.3 ml every week 06/08/17   [provider]  warfarin (COUMADIN) 5 MG tablet Take 0.5-1 tablets (2.5-5 mg total) by mouth See admin instructions. Take 0.5 tablet (2.5 mg) by mouth on Thursdays & Saturdays, take 1 tablet (5 mg) by mouth on Sundays, Mondays, Tuesdays, Wednesdays, & Fridays. 11/20/19   Anderson, Chelsey L, DO  zolpidem (AMBIEN) 5 MG tablet Take 5-10 mg by mouth at bedtime as needed for sleep.  09/15/18   [provider]    Current Facility-Administered Medications  Medication Dose Route Frequency Provider Last Rate Last Admin  . 0.9 %   sodium chloride infusion (Manually program via Guardrails IV Fluids)   Intravenous Once Quintella Reichert, MD       Current Outpatient Medications  Medication Sig Dispense Refill  . amoxicillin (AMOXIL) 500 MG capsule Take 4 capsules by mouth 1 hour prior to dental appointment (Patient taking differently: Take 2,000 mg by mouth See admin instructions. Take 4 capsules (2000 mg) by mouth 1 hour prior to dental appointment) 4 capsule 0  . enoxaparin (LOVENOX) 60 MG/0.6ML injection Inject 0.6 mLs (60 mg total) into the skin daily. 6 mL 1  . ferrous sulfate 325 (65 FE) MG tablet Take 325 mg by mouth every other day. In the evening.    . furosemide (LASIX) 40 MG tablet Take 1 tablet (40 mg total) by mouth daily. (Patient taking differently: Take 40 mg by mouth every evening. ) 30 tablet 4  . metolazone (ZAROXOLYN) 2.5 MG tablet Take 1 tablet (2.5 mg total) by mouth daily as needed. (Patient taking differently: Take 2.5 mg by mouth daily as needed (severe fluid retention.). ) 15 tablet 3  . Multiple Vitamin (MULTIVITAMIN WITH MINERALS) TABS tablet Take 1 tablet by mouth daily.    . polyethylene glycol (MIRALAX) 17 g packet Take 17 g by mouth daily as needed. (Patient taking differently: Take 17 g by mouth every three (3) days as needed (constipation.). ) 14 each 0  . sildenafil (VIAGRA) 100 MG tablet Take 100 mg by mouth daily as needed (erectile dysfunction).     Marland Kitchen spironolactone (ALDACTONE) 25 MG tablet Take 0.5 tablets (12.5 mg total) by mouth daily. 15 tablet 3  . testosterone cypionate (DEPOTESTOSTERONE CYPIONATE) 200 MG/ML injection Inject 60 mg into the muscle every Thursday. 0.3 ml every week    . warfarin (COUMADIN) 5 MG tablet Take 0.5-1 tablets (2.5-5 mg total) by mouth See admin instructions. Take 0.5 tablet (2.5 mg) by mouth on Thursdays & Saturdays, take 1 tablet (5 mg) by mouth  on Sundays, Mondays, Tuesdays, Wednesdays, & Fridays. 90 tablet 0  . zolpidem (AMBIEN) 5 MG tablet Take 5-10 mg by  mouth at bedtime as needed for sleep.       Allergies as of 11/26/2019 - Review Complete 11/26/2019  Allergen Reaction Noted  . Penicillins Rash 11/27/2011    Family History  Family history unknown: Yes    Social History   Socioeconomic History  . Marital status: Divorced    Spouse name: Not on file  . Number of children: 2  . Years of education: Not on file  . Highest education level: Some college, no degree  Occupational History  . Not on file  Tobacco Use  . Smoking status: Former Smoker    Types: Cigars    Quit date: 09/03/2017    Years since quitting: 2.2  . Smokeless tobacco: Never Used  Vaping Use  . Vaping Use: Never used  Substance and Sexual Activity  . Alcohol use: No  . Drug use: No  . Sexual activity: Not on file  Other Topics Concern  . Not on file  Social History Narrative   Lives at home alone    Left handed   Caffeine: 1-2 cups daily   Social Determinants of Health   Financial Resource Strain:   . Difficulty of Paying Living Expenses: Not on file  Food Insecurity:   . Worried About Charity fundraiser in the Last Year: Not on file  . Ran Out of Food in the Last Year: Not on file  Transportation Needs:   . Lack of Transportation (Medical): Not on file  . Lack of Transportation (Non-Medical): Not on file  Physical Activity:   . Days of Exercise per Week: Not on file  . Minutes of Exercise per Session: Not on file  Stress:   . Feeling of Stress : Not on file  Social Connections:   . Frequency of Communication with Friends and Family: Not on file  . Frequency of Social Gatherings with Friends and Family: Not on file  . Attends Religious Services: Not on file  . Active Member of Clubs or Organizations: Not on file  . Attends Archivist Meetings: Not on file  . Marital Status: Not on file  Intimate Partner Violence:   . Fear of Current or Ex-Partner: Not on file  . Emotionally Abused: Not on file  . Physically Abused: Not on file   . Sexually Abused: Not on file    Review of Systems: All systems reviewed and negative except where noted in HPI.  Physical Exam: Vital signs in last 24 hours: Temp:  [97.5 F (36.4 C)] 97.5 F (36.4 C) (08/23 1243) Pulse Rate:  [86-97] 88 (08/23 1400) Resp:  [13-22] 13 (08/23 1400) BP: (84-102)/(58-64) 102/64 (08/23 1400) SpO2:  [100 %] 100 % (08/23 1400)   General:   Alert, thin male in NAD Psych:  Pleasant, cooperative. Normal mood and affect. Eyes:  Pupils equal, sclera clear, no icterus.   Conjunctiva pink. Ears:  Normal auditory acuity. Nose:  No deformity, discharge,  or lesions. Neck:  Supple; no masses Lungs:  Clear throughout to auscultation.   No wheezes, crackles, or rhonchi.  Heart:  irregular rate, mechanical valve click , 1+ LLE edema.  + JVD  Abdomen:  Soft, non-distended, nontender, BS active, no palp mass   Rectal:  Deferred  Msk:  Symmetrical without gross deformities. . Neurologic:  Alert and  oriented x4;  grossly normal neurologically. Skin:  Intact  without significant lesions or rashes. Distal BLE skin with dark discoloration. Skin is thick    Intake/Output from previous day: No intake/output data recorded. Intake/Output this shift: No intake/output data recorded.  Lab Results: Recent Labs    11/26/19 1327  HGB 9.2*  HCT 27.0*   BMET Recent Labs    11/26/19 1318 11/26/19 1327  NA 138 140  K 3.2* 3.2*  CL 101 99  CO2 25  --   GLUCOSE 106* 102*  BUN 36* 36*  CREATININE 2.69* 2.70*  CALCIUM 9.1  --    LFT Recent Labs    11/26/19 1318  PROT 7.8  ALBUMIN 3.9  AST 112*  ALT 36  ALKPHOS 45  BILITOT 2.8*   PT/INR No results for input(s): LABPROT, INR in the last 72 hours. Hepatitis Panel No results for input(s): HEPBSAG, HCVAB, HEPAIGM, HEPBIGM in the last 72 hours.   . CBC Latest Ref Rng & Units 11/26/2019 11/19/2019 10/23/2019  WBC 4.0 - 10.5 K/uL - 3.7(L) -  Hemoglobin 13.0 - 17.0 g/dL 9.2(L) 10.2(L) 9.9(L)  Hematocrit 39  - 52 % 27.0(L) 32.4(L) 29.0(L)  Platelets 150 - 400 K/uL - 91(L) -    . CMP Latest Ref Rng & Units 11/26/2019 11/26/2019 10/23/2019  Glucose 70 - 99 mg/dL 102(H) 106(H) -  BUN 8 - 23 mg/dL 36(H) 36(H) -  Creatinine 0.61 - 1.24 mg/dL 2.70(H) 2.69(H) -  Sodium 135 - 145 mmol/L 140 138 145  Potassium 3.5 - 5.1 mmol/L 3.2(L) 3.2(L) 3.4(L)  Chloride 98 - 111 mmol/L 99 101 -  CO2 22 - 32 mmol/L - 25 -  Calcium 8.9 - 10.3 mg/dL - 9.1 -  Total Protein 6.5 - 8.1 g/dL - 7.8 -  Total Bilirubin 0.3 - 1.2 mg/dL - 2.8(H) -  Alkaline Phos 38 - 126 U/L - 45 -  AST 15 - 41 U/L - 112(H) -  ALT 0 - 44 U/L - 36 -   Studies/Results: DG Chest Port 1 View  Result Date: 11/26/2019 CLINICAL DATA:  Patient status post colonoscopy 11/20/2019 now with heavy rectal bleeding and hypotension. Weakness and shortness of breath. EXAM: PORTABLE CHEST 1 VIEW COMPARISON:  PA and lateral chest 03/04/2018. FINDINGS: The lung bases are below the inferior margin of the film. Imaged lung parenchyma is emphysematous but clear. There is cardiomegaly. The patient is status post mitral valve repair. IMPRESSION: No acute disease. Lung bases are below the inferior margin of the film. Emphysema. Cardiomegaly. Electronically Signed   By: Inge Rise M.D.   On: 11/26/2019 13:46    Active Problems:   * No active hospital problems. Tye Savoy, NP-C @  11/26/2019, 2:17 PM   I have reviewed the entire case in detail with the above APP and discussed the plan in detail.  Therefore, I agree with the diagnoses recorded above. In addition,  I have personally interviewed and examined the patient and have personally reviewed the recent upper endoscopy and colonoscopy reports.  My additional thoughts are as follows:  Post polypectomy bleeding for the last several days, now off Lovenox and last Coumadin dose 2 to 3 days ago.  Patient's hemoglobin down from baseline, past multiple bloody stools overnight and this morning.  Blood  pressure intermittently low in ED and he is definitely symptomatic feeling lightheaded when standing.  At the time we saw him blood pressure was 105/70, pulse in the 70s He is mentating well, and his wife was on the phone during  our entire visit and all questions were answered.  His abdomen is soft and nontender, he is breathing comfortably and does not have pulmonary edema on exam.  He does however have chronic severe pulmonary hypertension with JVD.  The plan is for bowel preparation this evening and colonoscopy with me tomorrow morning.  Unit of PRBCs to be transfused and then hemoglobin followed closely, with further transfusion if needed.  Although his INR is 2.0, I have elected not to give him either vitamin K or FFP since he has a mechanical mitral valve.  Although the INR may remain prolonged due to bleeding and consumption of clotting factors, I am hoping that by tomorrow more the Coumadin effect will have worn off. If the patient develops more brisk bleeding and becomes unstable overnight, that his procedure can be done sooner.  To that end, I will speak with Dr. Fuller Plan our on-call physician this evening to give him a clinical update.  We have consented the patient for both colonoscopy and upper endoscopy only for the unlikely event that the colonoscopy does not show a definite source for this bleeding.  However, I suspect that the sigmoid colon hot snare polypectomy site is the most likely source.  If the source is found and treated on colonoscopy, then the upper endoscopy will not be necessary. Mr. Stidham was agreeable after discussion of procedures and risks.   The benefits and risks of the planned procedure were described in detail with the patient or (when appropriate) their health care proxy.  Risks were outlined as including, but not limited to, bleeding, infection, perforation, adverse medication reaction leading to cardiac or pulmonary decompensation, pancreatitis (if ERCP).  The  limitation of incomplete mucosal visualization was also discussed.  No guarantees or warranties were given.  Patient at increased risk for cardiopulmonary complications of procedure due to medical comorbidities.  Support of the anesthesia service is requested due to patient's high risk cardiopulmonary conditions.   Nelida Meuse III Office:[phone]

## 2019-11-26 NOTE — Telephone Encounter (Addendum)
Patient is having " a lot" of  bleeding every hour to hour and a half all night.  He is waiting for his ride to take him to the ED.  He is feeling weak. He didn't feel it was safe to drive himself for the lab work that was ordered.   I discussed with him that if his ride is going to be delayed, he should contact EMS for transport to the ED now.  He verbalized understanding.

## 2019-11-26 NOTE — ED Provider Notes (Signed)
[facility] DEPT Provider Note   CSN: [id number] Arrival date & time: 11/26/19  1216     History Chief Complaint  Patient presents with  . Rectal Bleeding  . Shortness of Breath    [name] is a 69 y.o. male.  The history is provided by the patient and medical records. No language interpreter was used.  Rectal Bleeding Shortness of Breath  [name] is a 69 y.o. male who presents to the Emergency Department complaining of rectal bleeding. He presents the emergency department complaining of hematochezia that began about one week ago. He is one week status post colonoscopy with polypectomy. He had transient bleeding at the day after the procedure. Three days ago he developed recurrent bleeding. Over the last two days he reports bright red blood per rectum about every 1 to 2 hours. Some bowel movements are large and some bowel movements are small. He has associated dizziness, shortness of breath and feeling unwell. He denies any fevers, chest pain, nausea, vomiting, abdominal pain. He is unable to have anything to eat or drink due to resulting diarrhea. He is on Coumadin for mechanical mitral valve. He was placed on bridging Lovenox for the procedure. He did not take his Coumadin last night. Symptoms are severe, constant, worsening. He sees Dr. Havery Moros with [facility]     Past Medical History:  Diagnosis Date  . Allergy   . Aneurysm of ascending aorta (HCC)   . Atrial fibrillation ([place])   . BPH (benign prostatic hyperplasia)   . Colon polyps   . FHx: rheumatic heart disease   . H/O diplopia   . Hepatitis C   . Hypertension   . Hypogonadism male   . Mitral valve disease    mitral valve repair/replacement x3    Patient Active Problem List   Diagnosis Date Noted  . Gastric polyp   . Benign neoplasm of colon   . Encounter for medication review 03/23/2019  . Vertigo 06/07/2018  . Acute on chronic diastolic heart failure ([place]) 03/04/2018   . Cirrhosis ([place]) 05/08/2014  . Chronic atrial flutter ([place]) 06/05/2013  . Hepatitis C 06/05/2013  . Chronic diastolic heart failure ([place]) 06/05/2013  . Encounter for therapeutic drug monitoring 04/30/2013  . 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 History of colonic polyps 11/14/2012  . Thoracic aortic aneurysm ([place]) 05/02/2012    Past Surgical History:  Procedure Laterality Date  . BIOPSY  11/19/2019   Procedure: BIOPSY;  Surgeon: Yetta Flock, MD;  Location: [facility];  Service: Gastroenterology;;  EGD and COLON  . COLON SURGERY    . COLONOSCOPY    . COLONOSCOPY N/A 11/14/2012   Procedure: COLONOSCOPY;  Surgeon: Lear Ng, MD;  Location: [facility];  Service: Endoscopy;  Laterality: N/A;  . COLONOSCOPY WITH PROPOFOL N/A 11/19/2019   Procedure: COLONOSCOPY WITH PROPOFOL;  Surgeon: Yetta Flock, MD;  Location: [facility];  Service: Gastroenterology;  Laterality: N/A;  . ESOPHAGOGASTRODUODENOSCOPY (EGD) WITH PROPOFOL N/A 05/08/2014   Procedure: ESOPHAGOGASTRODUODENOSCOPY (EGD) WITH PROPOFOL;  Surgeon: Lear Ng, MD;  Location: [facility];  Service: Endoscopy;  Laterality: N/A;  . ESOPHAGOGASTRODUODENOSCOPY (EGD) WITH PROPOFOL N/A 11/19/2019   Procedure: ESOPHAGOGASTRODUODENOSCOPY (EGD) WITH PROPOFOL;  Surgeon: Yetta Flock, MD;  Location: [facility];  Service: Gastroenterology;  Laterality: N/A;  . HEMOSTASIS CLIP PLACEMENT  11/19/2019   Procedure: HEMOSTASIS CLIP PLACEMENT;  Surgeon: Yetta Flock, MD;  Location: [facility];  Service: Gastroenterology;;  .  MITRAL VALVE REPLACEMENT     1973 (bioprosthesis), 1988 ([employer] mechanical vlave) , 2006 (repair due to leak)  . POLYPECTOMY  11/19/2019   Procedure: POLYPECTOMY;  Surgeon: Yetta Flock, MD;  Location: [facility];  Service: Gastroenterology;;  . RIGHT HEART CATH N/A 11/24/2018   Procedure: RIGHT HEART CATH;  Surgeon: Jolaine Artist, MD;  Location:  [facility];  Service: Cardiovascular;  Laterality: N/A;  . RIGHT HEART CATH N/A 10/23/2019   Procedure: RIGHT HEART CATH;  Surgeon: Jolaine Artist, MD;  Location: [facility];  Service: Cardiovascular;  Laterality: N/A;  . UPPER GASTROINTESTINAL ENDOSCOPY     02-2010 and 05-2014       Family History  Family history unknown: Yes    Social History   Tobacco Use  . Smoking status: Former Smoker    Types: Cigars    Quit date: 09/03/2017    Years since quitting: 2.2  . Smokeless tobacco: Never Used  Vaping Use  . Vaping Use: Never used  Substance Use Topics  . Alcohol use: No  . Drug use: No    Home Medications Prior to Admission medications   Medication Sig Start Date End Date Taking? Authorizing Provider  amoxicillin (AMOXIL) 500 MG capsule Take 4 capsules by mouth 1 hour prior to dental appointment Patient taking differently: Take 2,000 mg by mouth See admin instructions. Take 4 capsules (2000 mg) by mouth 1 hour prior to dental appointment 09/06/19   Belva Crome, MD  enoxaparin (LOVENOX) 60 MG/0.6ML injection Inject 0.6 mLs (60 mg total) into the skin daily. 11/12/19   Belva Crome, MD  ferrous sulfate 325 (65 FE) MG tablet Take 325 mg by mouth every other day. In the evening.    [provider]  furosemide (LASIX) 40 MG tablet Take 1 tablet (40 mg total) by mouth daily. Patient taking differently: Take 40 mg by mouth every evening.  09/18/19   Bensimhon, Shaune Pascal, MD  metolazone (ZAROXOLYN) 2.5 MG tablet Take 1 tablet (2.5 mg total) by mouth daily as needed. Patient taking differently: Take 2.5 mg by mouth daily as needed (severe fluid retention.).  11/24/18   Bensimhon, Shaune Pascal, MD  Multiple Vitamin (MULTIVITAMIN WITH MINERALS) TABS tablet Take 1 tablet by mouth daily.    [provider]  polyethylene glycol (MIRALAX) 17 g packet Take 17 g by mouth daily as needed. Patient taking differently: Take 17 g by mouth every three (3) days as needed  (constipation.).  08/28/19   Armbruster, Carlota Raspberry, MD  sildenafil (VIAGRA) 100 MG tablet Take 100 mg by mouth daily as needed (erectile dysfunction).  04/03/18   [provider]  spironolactone (ALDACTONE) 25 MG tablet Take 0.5 tablets (12.5 mg total) by mouth daily. 09/18/19   Bensimhon, Shaune Pascal, MD  testosterone cypionate (DEPOTESTOSTERONE CYPIONATE) 200 MG/ML injection Inject 60 mg into the muscle every Thursday. 0.3 ml every week 06/08/17   [provider]  warfarin (COUMADIN) 5 MG tablet Take 0.5-1 tablets (2.5-5 mg total) by mouth See admin instructions. Take 0.5 tablet (2.5 mg) by mouth on Thursdays & Saturdays, take 1 tablet (5 mg) by mouth on Sundays, Mondays, Tuesdays, Wednesdays, & Fridays. 11/20/19   Anderson, Chelsey L, DO  zolpidem (AMBIEN) 5 MG tablet Take 5-10 mg by mouth at bedtime as needed for sleep.  09/15/18   [provider]    Allergies    Penicillins  Review of Systems   Review of Systems  Respiratory: Positive for shortness of breath.   Gastrointestinal: Positive for hematochezia.  All other systems reviewed and are negative.   Physical Exam Updated Vital Signs BP 100/68   Pulse 86   Temp (!) 97.5 F (36.4 C) (Oral)   Resp 17   SpO2 100%   Physical Exam Vitals and nursing note reviewed.  Constitutional:      Appearance: He is well-developed.  HENT:     Head: Normocephalic and atraumatic.  Cardiovascular:     Rate and Rhythm: Regular rhythm. Tachycardia present.     Comments: Mechanical click Pulmonary:     Effort: Pulmonary effort is normal. No respiratory distress.     Breath sounds: Normal breath sounds.  Abdominal:     Palpations: Abdomen is soft.     Tenderness: There is no abdominal tenderness. There is no guarding or rebound.  Musculoskeletal:        General: No tenderness.  Skin:    General: Skin is warm and dry.  Neurological:     Mental Status: He is alert and oriented to person, place, and time.  Psychiatric:         Behavior: Behavior normal.     ED Results / Procedures / Treatments   Labs (all labs ordered are listed, but only abnormal results are displayed) Labs Reviewed  COMPREHENSIVE METABOLIC PANEL - Abnormal; Notable for the following components:      Result Value   Potassium 3.2 (*)    Glucose, Bld 106 (*)    BUN 36 (*)    Creatinine, Ser 2.69 (*)    AST 112 (*)    Total Bilirubin 2.8 (*)    GFR calc non Af Amer 23 (*)    GFR calc Af Amer 27 (*)    All other components within normal limits  CBC WITH DIFFERENTIAL/PLATELET - Abnormal; Notable for the following components:   RBC 2.50 (*)    Hemoglobin 8.4 (*)    HCT 26.7 (*)    MCV 106.8 (*)    RDW 24.6 (*)    Platelets 139 (*)    All other components within normal limits  PROTIME-INR - Abnormal; Notable for the following components:   Prothrombin Time 22.4 (*)    INR 2.1 (*)    All other components within normal limits  I-STAT CHEM 8, ED - Abnormal; Notable for the following components:   Potassium 3.2 (*)    BUN 36 (*)    Creatinine, Ser 2.70 (*)    Glucose, Bld 102 (*)    Calcium, Ion 1.12 (*)    Hemoglobin 9.2 (*)    HCT 27.0 (*)    All other components within normal limits  SARS CORONAVIRUS 2 BY RT PCR (HOSPITAL ORDER, [facility])  PREPARE RBC (CROSSMATCH)  TYPE AND SCREEN    EKG None  Radiology DG Chest Port 1 View  Result Date: 11/26/2019 CLINICAL DATA:  Patient status post colonoscopy 11/20/2019 now with heavy rectal bleeding and hypotension. Weakness and shortness of breath. EXAM: PORTABLE CHEST 1 VIEW COMPARISON:  PA and lateral chest 03/04/2018. FINDINGS: The lung bases are below the inferior margin of the film. Imaged lung parenchyma is emphysematous but clear. There is cardiomegaly. The patient is status post mitral valve repair. IMPRESSION: No acute disease. Lung bases are below the inferior margin of the film. Emphysema. Cardiomegaly. Electronically Signed   By: Inge Rise M.D.   On: 11/26/2019 13:46    Procedures Procedures (including  critical care time) CRITICAL CARE Performed by: Quintella Reichert   Total critical care time: 40 minutes  Critical care time was exclusive of separately billable procedures and treating other patients.  Critical care was necessary to treat or prevent imminent or life-threatening deterioration.  Critical care was time spent personally by me on the following activities: development of treatment plan with patient and/or surrogate as well as nursing, discussions with consultants, evaluation of patient's response to treatment, examination of patient, obtaining history from patient or surrogate, ordering and performing treatments and interventions, ordering and review of laboratory studies, ordering and review of radiographic studies, pulse oximetry and re-evaluation of patient's condition.  Medications Ordered in ED Medications  0.9 %  sodium chloride infusion (Manually program via Guardrails IV Fluids) ( Intravenous Not Given 11/26/19 1438)  sodium chloride 0.9 % bolus 250 mL (has no administration in time range)  0.9 %  sodium chloride infusion (Manually program via Guardrails IV Fluids) (has no administration in time range)    ED Course  I have reviewed the triage vital signs and the nursing notes.  Pertinent labs & imaging results that were available during my care of the patient were reviewed by me and considered in my medical decision making (see chart for details).    MDM Rules/Calculators/A&P                         Patient with history of mechanical mitral valve on chronic anticoagulation here for evaluation of hematochezia following polypectomy one week ago. He is ill appearing on evaluation. He was hypotensive on ED presentation. INR is mildly sub therapeutic for him at 2.1. Hemoglobin is mildly down trending compared to priors at 8.4. BMP with mild worsening in his renal function when compared to priors. Given  his symptomatic anemia will transfuse one unit PRBC as well as provide a small IV fluid bolus for his transient hypotension. Discussed with Tye Savoy with [facility] will see the patient in consult. Hospitalist consulted for admission. Patient updated findings studies recommendation for mission and he is in agreement treatment plan.  Final Clinical Impression(s) / ED Diagnoses Final diagnoses:  Acute lower GI bleeding  Symptomatic anemia    Rx / DC Orders ED Discharge Orders    None       Quintella Reichert, MD 11/26/19 1448

## 2019-11-26 NOTE — H&P (Signed)
History and Physical    [name] WPY:[id number] DOB: [birth year] DOA: 11/26/2019  PCP: [name], DO Patient coming from: Home.  Chief Complaint: Rectal bleed  HPI: [name] is a 69 y.o. male with history of A. fib and mechanical heart valve on Coumadin, diastolic CHF, CKD-4, chronic venous insufficiency, HTN, BPH, cirrhosis and hepatitis C presenting with rectal bleeding.  Patient had recent surveillance colonoscopy and screening EGD on 11/19/2019.  He had a couple of gastric polyps and a couple of colonic polyps which were resected.  Biopsies were negative for high-grade dysplasia or H. pylori.  He was restarted on warfarin with Lovenox bridge.  He had rectal bleeding the next day that he describes of bright red blood with the stool.  Bleeding stopped spontaneously, then recurred on 11/23/2019.  He has been noticing  "a lot of right blood in stool" since then. He denies blood clots or dark tarry stool.  He also felt lightheaded and weak.  He has shortness of breath but no chest pain.  He denies nausea or vomiting.  Denies UTI symptoms.  He denies fever, chills, URI symptoms, sore throat, cough or focal neuro symptoms.  Denies known exposure to COVID-19.  He received [employer] COVID-19 vaccine in March 2021.  Patient has not taken his warfarin in 2 days.  Patient denies smoking cigarettes, drinking alcohol recreational drug use.  In ED, hemodynamically stable.  Hgb 8.4 (from 10.2 recently).  MCV 105.  K3.2.  Creatinine 2.7 (baseline 2.3-2.4).  BUN 36.  Platelet 139.  INR 2.1.  COVID-19 PCR negative.  One view CXR without acute finding.  GI consulted.  One unit of blood ordered.  Hospitalist service was called for admission.   ROS All review of system negative except for pertinent positives and negatives as history of present illness above.  PMH Past Medical History:  Diagnosis Date  . Allergy   . Aneurysm of ascending aorta (HCC)   . Atrial fibrillation ([place])    . BPH (benign prostatic hyperplasia)   . Colon polyps   . FHx: rheumatic heart disease   . H/O diplopia   . Hepatitis C   . Hypertension   . Hypogonadism male   . Mitral valve disease    mitral valve repair/replacement x3   PSH Past Surgical History:  Procedure Laterality Date  . BIOPSY  11/19/2019   Procedure: BIOPSY;  Surgeon: Yetta Flock, MD;  Location: [facility];  Service: Gastroenterology;;  EGD and COLON  . COLON SURGERY    . COLONOSCOPY    . COLONOSCOPY N/A 11/14/2012   Procedure: COLONOSCOPY;  Surgeon: Lear Ng, MD;  Location: [facility];  Service: Endoscopy;  Laterality: N/A;  . COLONOSCOPY WITH PROPOFOL N/A 11/19/2019   Procedure: COLONOSCOPY WITH PROPOFOL;  Surgeon: Yetta Flock, MD;  Location: [facility];  Service: Gastroenterology;  Laterality: N/A;  . ESOPHAGOGASTRODUODENOSCOPY (EGD) WITH PROPOFOL N/A 05/08/2014   Procedure: ESOPHAGOGASTRODUODENOSCOPY (EGD) WITH PROPOFOL;  Surgeon: Lear Ng, MD;  Location: [facility];  Service: Endoscopy;  Laterality: N/A;  . ESOPHAGOGASTRODUODENOSCOPY (EGD) WITH PROPOFOL N/A 11/19/2019   Procedure: ESOPHAGOGASTRODUODENOSCOPY (EGD) WITH PROPOFOL;  Surgeon: Yetta Flock, MD;  Location: [facility];  Service: Gastroenterology;  Laterality: N/A;  . HEMOSTASIS CLIP PLACEMENT  11/19/2019   Procedure: HEMOSTASIS CLIP PLACEMENT;  Surgeon: Yetta Flock, MD;  Location: [facility];  Service: Gastroenterology;;  . MITRAL VALVE REPLACEMENT     1973 (bioprosthesis), 1988 ([employer] mechanical vlave) ,  2006 (repair due to leak)  . POLYPECTOMY  11/19/2019   Procedure: POLYPECTOMY;  Surgeon: Yetta Flock, MD;  Location: [facility];  Service: Gastroenterology;;  . RIGHT HEART CATH N/A 11/24/2018   Procedure: RIGHT HEART CATH;  Surgeon: Jolaine Artist, MD;  Location: [facility];  Service: Cardiovascular;  Laterality: N/A;  . RIGHT HEART CATH N/A 10/23/2019   Procedure: RIGHT  HEART CATH;  Surgeon: Jolaine Artist, MD;  Location: [facility];  Service: Cardiovascular;  Laterality: N/A;  . UPPER GASTROINTESTINAL ENDOSCOPY     02-2010 and 05-2014   Fam HX Family History  Family history unknown: Yes    Social Hx  reports that he quit smoking about 2 years ago. His smoking use included cigars. He has never used smokeless tobacco. He reports that he does not drink alcohol and does not use drugs.  Allergy Allergies  Allergen Reactions  . Penicillins Rash    Did it involve swelling of the face/tongue/throat, SOB, or low BP? No Did it involve sudden or severe rash/hives, skin peeling, or any reaction on the inside of your mouth or nose? No Did you need to seek medical attention at a hospital or doctor's office? No When did it last happen?30 + years If all above answers are "NO", may proceed with cephalosporin use.    Home Meds Prior to Admission medications   Medication Sig Start Date End Date Taking? Authorizing Provider  amoxicillin (AMOXIL) 500 MG capsule Take 4 capsules by mouth 1 hour prior to dental appointment Patient taking differently: Take 2,000 mg by mouth See admin instructions. Take 4 capsules (2000 mg) by mouth 1 hour prior to dental appointment 09/06/19  Yes Belva Crome, MD  enoxaparin (LOVENOX) 60 MG/0.6ML injection Inject 0.6 mLs (60 mg total) into the skin daily. 11/12/19  Yes Belva Crome, MD  ferrous sulfate 325 (65 FE) MG tablet Take 325 mg by mouth every other day. In the evening.   Yes [provider]  furosemide (LASIX) 40 MG tablet Take 1 tablet (40 mg total) by mouth daily. Patient taking differently: Take 40 mg by mouth every evening.  09/18/19  Yes Bensimhon, Shaune Pascal, MD  metolazone (ZAROXOLYN) 2.5 MG tablet Take 1 tablet (2.5 mg total) by mouth daily as needed. Patient taking differently: Take 2.5 mg by mouth daily as needed (severe fluid retention.).  11/24/18  Yes Bensimhon, Shaune Pascal, MD  Multiple Vitamin  (MULTIVITAMIN WITH MINERALS) TABS tablet Take 1 tablet by mouth daily.   Yes [provider]  polyethylene glycol (MIRALAX) 17 g packet Take 17 g by mouth daily as needed. Patient taking differently: Take 17 g by mouth every three (3) days as needed (constipation.).  08/28/19  Yes Armbruster, Carlota Raspberry, MD  sildenafil (REVATIO) 20 MG tablet Take 20 mg by mouth daily as needed (ed).  11/13/19  Yes [provider]  spironolactone (ALDACTONE) 25 MG tablet Take 0.5 tablets (12.5 mg total) by mouth daily. 09/18/19  Yes Bensimhon, Shaune Pascal, MD  testosterone cypionate (DEPOTESTOSTERONE CYPIONATE) 200 MG/ML injection Inject 60 mg into the muscle every Thursday. 0.3 ml every week 06/08/17  Yes [provider]  warfarin (COUMADIN) 5 MG tablet Take 0.5-1 tablets (2.5-5 mg total) by mouth See admin instructions. Take 0.5 tablet (2.5 mg) by mouth on Thursdays & Saturdays, take 1 tablet (5 mg) by mouth on Sundays, Mondays, Tuesdays, Wednesdays, & Fridays. 11/20/19   [name], DO    Physical  Exam: Vitals:   11/26/19 1430 11/26/19 1500 11/26/19 1527 11/26/19 1542  BP: 100/68 101/63 107/67 106/66  Pulse: 86 (!) 196 93 82  Resp: 17 14 16 16   Temp:   98.2 F (36.8 C) 98.4 F (36.9 C)  TempSrc:   Oral Oral  SpO2: 100% 100% 100% 98%    GENERAL: Frail looking male.  No apparent distress.  Appears well.  HEENT: MMM.  Vision and hearing grossly intact.  NECK: Supple.  No apparent JVD.  RESP: On RA.  No IWOB. Good air movement bilaterally. CVS: Irregular rhythm.  Normal rate.  Mechanical heart valve. Heart sounds normal.  ABD/GI/GU: Bowel sounds present. Soft. Non tender.  MSK/EXT:  Moves extremities.  Chronic venous insufficiency. SKIN: no apparent skin lesion or wound NEURO: Awake, alert and oriented appropriately.  No gross deficit.  PSYCH: Calm. Normal affect.   Personally Reviewed Radiological Exams DG Chest Port 1 View  Result Date: 11/26/2019 CLINICAL DATA:  Patient  status post colonoscopy 11/20/2019 now with heavy rectal bleeding and hypotension. Weakness and shortness of breath. EXAM: PORTABLE CHEST 1 VIEW COMPARISON:  PA and lateral chest 03/04/2018. FINDINGS: The lung bases are below the inferior margin of the film. Imaged lung parenchyma is emphysematous but clear. There is cardiomegaly. The patient is status post mitral valve repair. IMPRESSION: No acute disease. Lung bases are below the inferior margin of the film. Emphysema. Cardiomegaly. Electronically Signed   By: Inge Rise M.D.   On: 11/26/2019 13:46     Personally Reviewed Labs: CBC: Recent Labs  Lab 11/26/19 1318 11/26/19 1327  WBC 5.9  --   NEUTROABS 4.0  --   HGB 8.4* 9.2*  HCT 26.7* 27.0*  MCV 106.8*  --   PLT 139*  --    Basic Metabolic Panel: Recent Labs  Lab 11/26/19 1318 11/26/19 1327  NA 138 140  K 3.2* 3.2*  CL 101 99  CO2 25  --   GLUCOSE 106* 102*  BUN 36* 36*  CREATININE 2.69* 2.70*  CALCIUM 9.1  --    GFR: Estimated Creatinine Clearance: 23.5 mL/min (A) (by C-G formula based on SCr of 2.7 mg/dL (H)). Liver Function Tests: Recent Labs  Lab 11/26/19 1318  AST 112*  ALT 36  ALKPHOS 45  BILITOT 2.8*  PROT 7.8  ALBUMIN 3.9   No results for input(s): LIPASE, AMYLASE in the last 168 hours. No results for input(s): AMMONIA in the last 168 hours. Coagulation Profile: Recent Labs  Lab 11/26/19 1318  INR 2.1*   Cardiac Enzymes: No results for input(s): CKTOTAL, CKMB, CKMBINDEX, TROPONINI in the last 168 hours. BNP (last 3 results) No results for input(s): PROBNP in the last 8760 hours. HbA1C: No results for input(s): HGBA1C in the last 72 hours. CBG: No results for input(s): GLUCAP in the last 168 hours. Lipid Profile: No results for input(s): CHOL, HDL, LDLCALC, TRIG, CHOLHDL, LDLDIRECT in the last 72 hours. Thyroid Function Tests: No results for input(s): TSH, T4TOTAL, FREET4, T3FREE, THYROIDAB in the last 72 hours. Anemia Panel: No results  for input(s): VITAMINB12, FOLATE, FERRITIN, TIBC, IRON, RETICCTPCT in the last 72 hours. Urine analysis: No results found for: COLORURINE, APPEARANCEUR, LABSPEC, PHURINE, GLUCOSEU, HGBUR, BILIRUBINUR, KETONESUR, PROTEINUR, UROBILINOGEN, NITRITE, LEUKOCYTESUR  Sepsis Labs:  None  Personally Reviewed EKG:  Not obtained.  Assessment/Plan Acute symptomatic blood loss anemia due to rectal bleed/hematochezia-Hgb 8.4 (baseline 10.2).  Patient is on Coumadin for mechanical heart valve and A. Fib.  Has not had his warfarin  in 2 days. -Monitor H&H every 6 hours. -Check anemia panel -One unit of blood ordered -GI consulted. -Secured two PIV lines -Hold home anticoagulations.  Paroxysmal A. fib-irregular rhythm but rate controlled. -Hold anticoagulation and cardiac meds in setting of GI bleed  History of RHD s/p mechanical mitral valve-on Coumadin.  INR is 2.1.  Has not taken his Coumadin in 2 days. -Hold Coumadin in the setting of bleeding.  AKI on CKD-4/azotemia: Cr 2.7 (baseline 2.3-2.4).  BUN 36.  Due to diuretics? -IV NS at 50 cc an hour/24 hours. -Recheck in the morning -Hold home diuretics.  Hypokalemia: K3.2. -IV KCl 10 mEq x 4 -Check magnesium.  History of cirrhosis due to hepatitis C-appears compensated. -Hold diuretics.  Chronic diastolic CHF/RV failure: Echo in 10/2018 with EF of 60 to 65%, RVSP of 55 and aortic root measuring 44 mm.  Appears euvolemic. -Hold home diuretics -Monitor fluid status  Essential hypertension: Normotensive. -Hold home antihypertensive medication  Macrocytosis: Likely due to liver cirrhosis. -Check anemia panel  Thrombocytopenia: Platelet 139.  Likely due to liver cirrhosis. -Monitor.  Debility/physical deconditioning -PT/OT eval  Severe protein calorie malnutrition-as evidenced by significant muscle mass and subcu fat loss. -Consult dietitian   DVT prophylaxis: SCD in the setting of GI bleed  Code Status: Full code Family  Communication: Updated patient's wife over the phone. Disposition Plan: Admit to telemetry bed Consults called: GI Admission status: Inpatient.  Severity of Illness: The appropriate patient status for this patient is INPATIENT. Inpatient status is judged to be reasonable and necessary in order to provide the required intensity of service to ensure the patient's safety. The patient's presenting symptoms, physical exam findings, and initial radiographic and laboratory data in the context of their chronic comorbidities is felt to place them at high risk for further clinical deterioration. Furthermore, it is not anticipated that the patient will be medically stable for discharge from the hospital within 2 midnights of admission. The following factors support the patient status of inpatient.    "           The patient's presenting symptoms include lightheadedness, shortness of breath, weakness and rectal bleed "           The worrisome physical exam findings include weakness and frail. "           The initial radiographic and laboratory data are worrisome because Hgb 8.4 (about 2 g down from recent baseline), elevated creatinine "           The chronic co-morbidities include A. fib and mechanical heart valve on Coumadin, diastolic CHF, CKD-4, chronic venous insufficiency, HTN, BPH, cirrhosis and hepatitis C      I certify that at the point of admission it is my clinical judgment that the patient will require inpatient hospital care spanning beyond 2 midnights from the point of admission due to high intensity of service, high risk for further deterioration and high frequency of surveillance required.   Mercy Riding MD [facility]  If 7PM-7AM, please contact night-coverage [email or web address]  11/26/2019, 3:56 PM

## 2019-11-26 NOTE — Telephone Encounter (Signed)
Okay thanks for letting me know, sorry to hear this but yes agree he needs to go to the hospital given his anticoagulation and persistent bleeding, I will let the inpatient team know.   Nevin Bloodgood, [place]

## 2019-11-26 NOTE — ED Triage Notes (Signed)
Pt reports having a colonoscopy on 8/17. Pt reports he has now had heavy rectal bleeding since 8/20. Pt reports dizziness, weakness, and SHOB that has become progressively worse. Pt reports he is on Coumadin. Pt is hypotensive in triage.

## 2019-11-26 NOTE — Consult Note (Addendum)
Referring Provider:  [facility]         Primary Care Physician:  [name], DO Primary Gastroenterologist: Jolly Mango MD            We were asked to see this patient for:    GI bleeding              ASSESSMENT /  PLAN     [name] is a 69 y.o. male PMH significant for, but not necessarily limited to,   AFIB  on warfarin, hx of rheumatic heart disease s/p mechanical valve replacement, chronic diastolic heart failure, pulmonary HTN, ascending aortic aneurysm, CKD, cirrhosis, colon polyps, HCV                                                                                                                                 # GI bleed, (hematochezia) with hypotension on anticoagulation. Probably post-polypectomy bleed.  --would not give FFP or Vit K given that he has a prosthetic mitral valve. Will need to let INR drift though this does increase his risk for ongoing bleeding.   --Suspect bleeding from sigmoid polyp removed on 8/16. Will prep today for am colonoscopy. He had small gastric biopsies so in the unlikely event that colonoscopy is negative will consent for EGD --Q 6 hours CBC.   # ABL anemia --Hgb 8.4, down from baseline of 10.2 --a unit of blood has already been ordered.   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   --INR 2.1- -medication induced coagulopathy (see attending addendum below)  # Cirrhosis, compensated.    HPI:    Chief Complaint: passing blood in stool  [name] is a 69 y.o. male with history of a mechanical valve replacement on chronic warfarin who underwent EGD for varices screening and a polyp surveillance colonoscopy on Monday 11/19/19. In preparations for the procedures he was given a lovenox bridge and his warfarin was held. A couple of gastric polyps were biopsied and a couple of colon polyps were removed,  including a 6 mm one in the sigmoid colon. The following day patient resumed warfarin and the Lovenox  was continued. On that day had some self-limited rectal bleeding. No further bleeding on Wed or Thurs but by Friday the bleeding had restarted. Patient spoke with our on-call- provider on Saturday. Upon our recommendation he did not take warfarin nor Lovenox on Saturday. Unfortunately the bleeding has continued and he has since had several painless episodes of rectal bleeding ( bright red blood). Patient spoke with our office today, we recommended ED evaluation. In ED patient has been hypotensive. He admits to dizziness and SHOB. Baseline hgb is 10.2, it is down to 8.4. BP has improved with IV fluids  PREVIOUS ENDOSCOPIC EVALUATIONS / GI STUDIES :  11/19/19 colonoscopy - Diverticulosis in the sigmoid colon. - One 4 to 5 mm polyp in the ascending colon, removed with a cold  snare. Resected and retrieved. - One diminutive polyp in the sigmoid colon, removed with a cold biopsy forceps. Resected and retrieved. - One 6 mm polyp in the sigmoid colon, removed with a hot snare. Resected and retrieved. Clip was placed.  11/19/19 EGD for varices screening -Normal esophagus. No esophageal varices - No gastric varices - Multiple benign appearing gastric polyps. Biopsied. - A single benign appearing duodenal polypoid lesion vs. normal variant. Biopsied.  Past Medical History:  Diagnosis Date  . Allergy   . Aneurysm of ascending aorta (HCC)   . Atrial fibrillation ([place])   . BPH (benign prostatic hyperplasia)   . Colon polyps   . FHx: rheumatic heart disease   . H/O diplopia   . Hepatitis C   . Hypertension   . Hypogonadism male   . Mitral valve disease    mitral valve repair/replacement x3    Past Surgical History:  Procedure Laterality Date  . BIOPSY  11/19/2019   Procedure: BIOPSY;  Surgeon: Yetta Flock, MD;  Location: [facility];  Service: Gastroenterology;;  EGD and COLON  . COLON SURGERY    . COLONOSCOPY    . COLONOSCOPY N/A 11/14/2012   Procedure: COLONOSCOPY;  Surgeon:  Lear Ng, MD;  Location: [facility];  Service: Endoscopy;  Laterality: N/A;  . COLONOSCOPY WITH PROPOFOL N/A 11/19/2019   Procedure: COLONOSCOPY WITH PROPOFOL;  Surgeon: Yetta Flock, MD;  Location: [facility];  Service: Gastroenterology;  Laterality: N/A;  . ESOPHAGOGASTRODUODENOSCOPY (EGD) WITH PROPOFOL N/A 05/08/2014   Procedure: ESOPHAGOGASTRODUODENOSCOPY (EGD) WITH PROPOFOL;  Surgeon: Lear Ng, MD;  Location: [place];  Service: Endoscopy;  Laterality: N/A;  . ESOPHAGOGASTRODUODENOSCOPY (EGD) WITH PROPOFOL N/A 11/19/2019   Procedure: ESOPHAGOGASTRODUODENOSCOPY (EGD) WITH PROPOFOL;  Surgeon: Yetta Flock, MD;  Location: [facility];  Service: Gastroenterology;  Laterality: N/A;  . HEMOSTASIS CLIP PLACEMENT  11/19/2019   Procedure: HEMOSTASIS CLIP PLACEMENT;  Surgeon: Yetta Flock, MD;  Location: [facility];  Service: Gastroenterology;;  . MITRAL VALVE REPLACEMENT     1973 (bioprosthesis), 1988 ([employer] Jude mechanical vlave) , 2006 (repair due to leak)  . POLYPECTOMY  11/19/2019   Procedure: POLYPECTOMY;  Surgeon: Yetta Flock, MD;  Location: [facility];  Service: Gastroenterology;;  . RIGHT HEART CATH N/A 11/24/2018   Procedure: RIGHT HEART CATH;  Surgeon: Jolaine Artist, MD;  Location: [place] [facility];  Service: Cardiovascular;  Laterality: N/A;  . RIGHT HEART CATH N/A 10/23/2019   Procedure: RIGHT HEART CATH;  Surgeon: Jolaine Artist, MD;  Location: [place] [facility];  Service: Cardiovascular;  Laterality: N/A;  . UPPER GASTROINTESTINAL ENDOSCOPY     02-2010 and 05-2014    Prior to Admission medications   Medication Sig Start Date End Date Taking? Authorizing Provider  amoxicillin (AMOXIL) 500 MG capsule Take 4 capsules by mouth 1 hour prior to dental appointment Patient taking differently: Take 2,000 mg by mouth See admin instructions. Take 4 capsules (2000 mg) by mouth 1 hour prior to dental appointment 09/06/19    Belva Crome, MD  enoxaparin (LOVENOX) 60 MG/0.6ML injection Inject 0.6 mLs (60 mg total) into the skin daily. 11/12/19   Belva Crome, MD  ferrous sulfate 325 (65 FE) MG tablet Take 325 mg by mouth every other day. In the evening.    [provider]  furosemide (LASIX) 40 MG tablet Take 1 tablet (40 mg total) by mouth daily. Patient taking differently: Take 40 mg by mouth every evening.  09/18/19  Bensimhon, Shaune Pascal, MD  metolazone (ZAROXOLYN) 2.5 MG tablet Take 1 tablet (2.5 mg total) by mouth daily as needed. Patient taking differently: Take 2.5 mg by mouth daily as needed (severe fluid retention.).  11/24/18   Bensimhon, Shaune Pascal, MD  Multiple Vitamin (MULTIVITAMIN WITH MINERALS) TABS tablet Take 1 tablet by mouth daily.    [provider]  polyethylene glycol (MIRALAX) 17 g packet Take 17 g by mouth daily as needed. Patient taking differently: Take 17 g by mouth every three (3) days as needed (constipation.).  08/28/19   Armbruster, Carlota Raspberry, MD  sildenafil (VIAGRA) 100 MG tablet Take 100 mg by mouth daily as needed (erectile dysfunction).  04/03/18   [provider]  spironolactone (ALDACTONE) 25 MG tablet Take 0.5 tablets (12.5 mg total) by mouth daily. 09/18/19   Bensimhon, Shaune Pascal, MD  testosterone cypionate (DEPOTESTOSTERONE CYPIONATE) 200 MG/ML injection Inject 60 mg into the muscle every Thursday. 0.3 ml every week 06/08/17   [provider]  warfarin (COUMADIN) 5 MG tablet Take 0.5-1 tablets (2.5-5 mg total) by mouth See admin instructions. Take 0.5 tablet (2.5 mg) by mouth on Thursdays & Saturdays, take 1 tablet (5 mg) by mouth on Sundays, Mondays, Tuesdays, Wednesdays, & Fridays. 11/20/19   Anderson, Chelsey L, DO  zolpidem (AMBIEN) 5 MG tablet Take 5-10 mg by mouth at bedtime as needed for sleep.  09/15/18   [provider]    Current Facility-Administered Medications  Medication Dose Route Frequency Provider Last Rate Last Admin  . 0.9 %   sodium chloride infusion (Manually program via Guardrails IV Fluids)   Intravenous Once Quintella Reichert, MD       Current Outpatient Medications  Medication Sig Dispense Refill  . amoxicillin (AMOXIL) 500 MG capsule Take 4 capsules by mouth 1 hour prior to dental appointment (Patient taking differently: Take 2,000 mg by mouth See admin instructions. Take 4 capsules (2000 mg) by mouth 1 hour prior to dental appointment) 4 capsule 0  . enoxaparin (LOVENOX) 60 MG/0.6ML injection Inject 0.6 mLs (60 mg total) into the skin daily. 6 mL 1  . ferrous sulfate 325 (65 FE) MG tablet Take 325 mg by mouth every other day. In the evening.    . furosemide (LASIX) 40 MG tablet Take 1 tablet (40 mg total) by mouth daily. (Patient taking differently: Take 40 mg by mouth every evening. ) 30 tablet 4  . metolazone (ZAROXOLYN) 2.5 MG tablet Take 1 tablet (2.5 mg total) by mouth daily as needed. (Patient taking differently: Take 2.5 mg by mouth daily as needed (severe fluid retention.). ) 15 tablet 3  . Multiple Vitamin (MULTIVITAMIN WITH MINERALS) TABS tablet Take 1 tablet by mouth daily.    . polyethylene glycol (MIRALAX) 17 g packet Take 17 g by mouth daily as needed. (Patient taking differently: Take 17 g by mouth every three (3) days as needed (constipation.). ) 14 each 0  . sildenafil (VIAGRA) 100 MG tablet Take 100 mg by mouth daily as needed (erectile dysfunction).     Marland Kitchen spironolactone (ALDACTONE) 25 MG tablet Take 0.5 tablets (12.5 mg total) by mouth daily. 15 tablet 3  . testosterone cypionate (DEPOTESTOSTERONE CYPIONATE) 200 MG/ML injection Inject 60 mg into the muscle every Thursday. 0.3 ml every week    . warfarin (COUMADIN) 5 MG tablet Take 0.5-1 tablets (2.5-5 mg total) by mouth See admin instructions. Take 0.5 tablet (2.5 mg) by mouth on Thursdays & Saturdays, take 1 tablet (5 mg) by mouth  on Sundays, Mondays, Tuesdays, Wednesdays, & Fridays. 90 tablet 0  . zolpidem (AMBIEN) 5 MG tablet Take 5-10 mg by  mouth at bedtime as needed for sleep.       Allergies as of 11/26/2019 - Review Complete 11/26/2019  Allergen Reaction Noted  . Penicillins Rash 11/27/2011    Family History  Family history unknown: Yes    Social History   Socioeconomic History  . Marital status: Divorced    Spouse name: Not on file  . Number of children: 2  . Years of education: Not on file  . Highest education level: Some college, no degree  Occupational History  . Not on file  Tobacco Use  . Smoking status: Former Smoker    Types: Cigars    Quit date: 09/03/2017    Years since quitting: 2.2  . Smokeless tobacco: Never Used  Vaping Use  . Vaping Use: Never used  Substance and Sexual Activity  . Alcohol use: No  . Drug use: No  . Sexual activity: Not on file  Other Topics Concern  . Not on file  Social History Narrative   Lives at home alone    Left handed   Caffeine: 1-2 cups daily   Social Determinants of Health   Financial Resource Strain:   . Difficulty of Paying Living Expenses: Not on file  Food Insecurity:   . Worried About Charity fundraiser in the Last Year: Not on file  . Ran Out of Food in the Last Year: Not on file  Transportation Needs:   . Lack of Transportation (Medical): Not on file  . Lack of Transportation (Non-Medical): Not on file  Physical Activity:   . Days of Exercise per Week: Not on file  . Minutes of Exercise per Session: Not on file  Stress:   . Feeling of Stress : Not on file  Social Connections:   . Frequency of Communication with Friends and Family: Not on file  . Frequency of Social Gatherings with Friends and Family: Not on file  . Attends Religious Services: Not on file  . Active Member of Clubs or Organizations: Not on file  . Attends Archivist Meetings: Not on file  . Marital Status: Not on file  Intimate Partner Violence:   . Fear of Current or Ex-Partner: Not on file  . Emotionally Abused: Not on file  . Physically Abused: Not on file   . Sexually Abused: Not on file    Review of Systems: All systems reviewed and negative except where noted in HPI.  Physical Exam: Vital signs in last 24 hours: Temp:  [97.5 F (36.4 C)] 97.5 F (36.4 C) (08/23 1243) Pulse Rate:  [86-97] 88 (08/23 1400) Resp:  [13-22] 13 (08/23 1400) BP: (84-102)/(58-64) 102/64 (08/23 1400) SpO2:  [100 %] 100 % (08/23 1400)   General:   Alert, thin male in NAD Psych:  Pleasant, cooperative. Normal mood and affect. Eyes:  Pupils equal, sclera clear, no icterus.   Conjunctiva pink. Ears:  Normal auditory acuity. Nose:  No deformity, discharge,  or lesions. Neck:  Supple; no masses Lungs:  Clear throughout to auscultation.   No wheezes, crackles, or rhonchi.  Heart:  irregular rate, mechanical valve click , 1+ LLE edema.  + JVD  Abdomen:  Soft, non-distended, nontender, BS active, no palp mass   Rectal:  Deferred  Msk:  Symmetrical without gross deformities. . Neurologic:  Alert and  oriented x4;  grossly normal neurologically. Skin:  Intact  without significant lesions or rashes. Distal BLE skin with dark discoloration. Skin is thick    Intake/Output from previous day: No intake/output data recorded. Intake/Output this shift: No intake/output data recorded.  Lab Results: Recent Labs    11/26/19 1327  HGB 9.2*  HCT 27.0*   BMET Recent Labs    11/26/19 1318 11/26/19 1327  NA 138 140  K 3.2* 3.2*  CL 101 99  CO2 25  --   GLUCOSE 106* 102*  BUN 36* 36*  CREATININE 2.69* 2.70*  CALCIUM 9.1  --    LFT Recent Labs    11/26/19 1318  PROT 7.8  ALBUMIN 3.9  AST 112*  ALT 36  ALKPHOS 45  BILITOT 2.8*   PT/INR No results for input(s): LABPROT, INR in the last 72 hours. Hepatitis Panel No results for input(s): HEPBSAG, HCVAB, HEPAIGM, HEPBIGM in the last 72 hours.   . CBC Latest Ref Rng & Units 11/26/2019 11/19/2019 10/23/2019  WBC 4.0 - 10.5 K/uL - 3.7(L) -  Hemoglobin 13.0 - 17.0 g/dL 9.2(L) 10.2(L) 9.9(L)  Hematocrit 39  - 52 % 27.0(L) 32.4(L) 29.0(L)  Platelets 150 - 400 K/uL - 91(L) -    . CMP Latest Ref Rng & Units 11/26/2019 11/26/2019 10/23/2019  Glucose 70 - 99 mg/dL 102(H) 106(H) -  BUN 8 - 23 mg/dL 36(H) 36(H) -  Creatinine 0.61 - 1.24 mg/dL 2.70(H) 2.69(H) -  Sodium 135 - 145 mmol/L 140 138 145  Potassium 3.5 - 5.1 mmol/L 3.2(L) 3.2(L) 3.4(L)  Chloride 98 - 111 mmol/L 99 101 -  CO2 22 - 32 mmol/L - 25 -  Calcium 8.9 - 10.3 mg/dL - 9.1 -  Total Protein 6.5 - 8.1 g/dL - 7.8 -  Total Bilirubin 0.3 - 1.2 mg/dL - 2.8(H) -  Alkaline Phos 38 - 126 U/L - 45 -  AST 15 - 41 U/L - 112(H) -  ALT 0 - 44 U/L - 36 -   Studies/Results: DG Chest Port 1 View  Result Date: 11/26/2019 CLINICAL DATA:  Patient status post colonoscopy 11/20/2019 now with heavy rectal bleeding and hypotension. Weakness and shortness of breath. EXAM: PORTABLE CHEST 1 VIEW COMPARISON:  PA and lateral chest 03/04/2018. FINDINGS: The lung bases are below the inferior margin of the film. Imaged lung parenchyma is emphysematous but clear. There is cardiomegaly. The patient is status post mitral valve repair. IMPRESSION: No acute disease. Lung bases are below the inferior margin of the film. Emphysema. Cardiomegaly. Electronically Signed   By: Inge Rise M.D.   On: 11/26/2019 13:46    Active Problems:   * No active hospital problems. Tye Savoy, NP-C @  11/26/2019, 2:17 PM   I have reviewed the entire case in detail with the above APP and discussed the plan in detail.  Therefore, I agree with the diagnoses recorded above. In addition,  I have personally interviewed and examined the patient and have personally reviewed the recent upper endoscopy and colonoscopy reports.  My additional thoughts are as follows:  Post polypectomy bleeding for the last several days, now off Lovenox and last Coumadin dose 2 to 3 days ago.  Patient's hemoglobin down from baseline, past multiple bloody stools overnight and this morning.  Blood  pressure intermittently low in ED and he is definitely symptomatic feeling lightheaded when standing.  At the time we saw him blood pressure was 105/70, pulse in the 70s He is mentating well, and his wife was on the phone during  our entire visit and all questions were answered.  His abdomen is soft and nontender, he is breathing comfortably and does not have pulmonary edema on exam.  He does however have chronic severe pulmonary hypertension with JVD.  The plan is for bowel preparation this evening and colonoscopy with me tomorrow morning.  Unit of PRBCs to be transfused and then hemoglobin followed closely, with further transfusion if needed.  Although his INR is 2.0, I have elected not to give him either vitamin K or FFP since he has a mechanical mitral valve.  Although the INR may remain prolonged due to bleeding and consumption of clotting factors, I am hoping that by tomorrow more the Coumadin effect will have worn off. If the patient develops more brisk bleeding and becomes unstable overnight, that his procedure can be done sooner.  To that end, I will speak with Dr. Fuller Plan our on-call physician this evening to give him a clinical update.  We have consented the patient for both colonoscopy and upper endoscopy only for the unlikely event that the colonoscopy does not show a definite source for this bleeding.  However, I suspect that the sigmoid colon hot snare polypectomy site is the most likely source.  If the source is found and treated on colonoscopy, then the upper endoscopy will not be necessary. [name] was agreeable after discussion of procedures and risks.   The benefits and risks of the planned procedure were described in detail with the patient or (when appropriate) their health care proxy.  Risks were outlined as including, but not limited to, bleeding, infection, perforation, adverse medication reaction leading to cardiac or pulmonary decompensation, pancreatitis (if ERCP).  The  limitation of incomplete mucosal visualization was also discussed.  No guarantees or warranties were given.  Patient at increased risk for cardiopulmonary complications of procedure due to medical comorbidities.  Support of the anesthesia service is requested due to patient's high risk cardiopulmonary conditions.   Nelida Meuse III [phone]

## 2019-11-26 NOTE — Telephone Encounter (Signed)
Thanks Ulice Dash. Sorry to hear about this.  Barbera Setters can you check on him and see how he is doing today. I think he is having some intermittent low grade bleeding from the polypectomy site. Hopefully by stopping the lovenox this stops on its own. Agree with CBC and INR today if he can come to the lab, if INR okay we can completely stop the lovenox. Will continue to observe if Hgb stable and bleeding minimal or stops. If he has significant bleeding he needs to call us back. Thanks

## 2019-11-27 ENCOUNTER — Encounter (HOSPITAL_COMMUNITY): Payer: Self-pay | Admitting: Student

## 2019-11-27 ENCOUNTER — Inpatient Hospital Stay (HOSPITAL_COMMUNITY): Payer: Medicare Other | Admitting: Certified Registered Nurse Anesthetist

## 2019-11-27 ENCOUNTER — Encounter (HOSPITAL_COMMUNITY)
Admission: EM | Disposition: A | Discharge status: Home / Self Care | Payer: Self-pay | Source: Home / Self Care | Attending: Family Medicine

## 2019-11-27 DIAGNOSIS — K633 Ulcer of intestine: Secondary | ICD-10-CM

## 2019-11-27 DIAGNOSIS — I480 Paroxysmal atrial fibrillation: Secondary | ICD-10-CM

## 2019-11-27 DIAGNOSIS — I10 Essential (primary) hypertension: Secondary | ICD-10-CM

## 2019-11-27 HISTORY — PX: HEMOSTASIS CLIP PLACEMENT: SHX6857

## 2019-11-27 HISTORY — PX: COLONOSCOPY WITH PROPOFOL: SHX5780

## 2019-11-27 LAB — CBC
HCT: 22.1 % — ABNORMAL LOW (ref 39.0–52.0)
HCT: 30.1 % — ABNORMAL LOW (ref 39.0–52.0)
Hemoglobin: 7 g/dL — ABNORMAL LOW (ref 13.0–17.0)
Hemoglobin: 9.8 g/dL — ABNORMAL LOW (ref 13.0–17.0)
MCH: 32.9 pg (ref 26.0–34.0)
MCH: 31.8 pg (ref 26.0–34.0)
MCHC: 31.7 g/dL (ref 30.0–36.0)
MCHC: 32.6 g/dL (ref 30.0–36.0)
MCV: 103.8 fL — ABNORMAL HIGH (ref 80.0–100.0)
MCV: 97.7 fL (ref 80.0–100.0)
Platelets: 101 10*3/uL — ABNORMAL LOW (ref 150–400)
Platelets: 105 10*3/uL — ABNORMAL LOW (ref 150–400)
RBC: 2.13 MIL/uL — ABNORMAL LOW (ref 4.22–5.81)
RBC: 3.08 MIL/uL — ABNORMAL LOW (ref 4.22–5.81)
RDW: 26.6 % — ABNORMAL HIGH (ref 11.5–15.5)
RDW: 23.6 % — ABNORMAL HIGH (ref 11.5–15.5)
WBC: 8.4 10*3/uL (ref 4.0–10.5)
WBC: 8.5 10*3/uL (ref 4.0–10.5)
nRBC: 0 % (ref 0.0–0.2)
nRBC: 0 % (ref 0.0–0.2)

## 2019-11-27 LAB — COMPREHENSIVE METABOLIC PANEL
ALT: 35 U/L (ref 0–44)
AST: 109 U/L — ABNORMAL HIGH (ref 15–41)
Albumin: 3.3 g/dL — ABNORMAL LOW (ref 3.5–5.0)
Alkaline Phosphatase: 35 U/L — ABNORMAL LOW (ref 38–126)
Anion gap: 16 — ABNORMAL HIGH (ref 5–15)
BUN: 43 mg/dL — ABNORMAL HIGH (ref 8–23)
CO2: 22 mmol/L (ref 22–32)
Calcium: 8.1 mg/dL — ABNORMAL LOW (ref 8.9–10.3)
Chloride: 103 mmol/L (ref 98–111)
Creatinine, Ser: 3.3 mg/dL — ABNORMAL HIGH (ref 0.61–1.24)
GFR calc Af Amer: 21 mL/min — ABNORMAL LOW (ref >60)
GFR calc non Af Amer: 18 mL/min — ABNORMAL LOW (ref >60)
Glucose, Bld: 113 mg/dL — ABNORMAL HIGH (ref 70–99)
Potassium: 3.9 mmol/L (ref 3.5–5.1)
Sodium: 141 mmol/L (ref 135–145)
Total Bilirubin: 2 mg/dL — ABNORMAL HIGH (ref 0.3–1.2)
Total Protein: 6.6 g/dL (ref 6.5–8.1)

## 2019-11-27 LAB — APTT: aPTT: 35 seconds (ref 24–36)

## 2019-11-27 LAB — PROTIME-INR
INR: 2.2 — ABNORMAL HIGH (ref 0.8–1.2)
Prothrombin Time: 23.7 seconds — ABNORMAL HIGH (ref 11.4–15.2)

## 2019-11-27 LAB — PREPARE RBC (CROSSMATCH)

## 2019-11-27 SURGERY — COLONOSCOPY WITH PROPOFOL
Anesthesia: Monitor Anesthesia Care

## 2019-11-27 MED ORDER — LIDOCAINE 2% (20 MG/ML) 5 ML SYRINGE
INTRAMUSCULAR | Status: DC | PRN
  Administered 2019-11-27: 60 mg via INTRAVENOUS

## 2019-11-27 MED ORDER — PROPOFOL 500 MG/50ML IV EMUL
INTRAVENOUS | Status: DC | PRN
  Administered 2019-11-27: 100 ug/kg/min via INTRAVENOUS

## 2019-11-27 MED ORDER — PHENYLEPHRINE HCL-NACL 10-0.9 MG/250ML-% IV SOLN
INTRAVENOUS | Status: DC | PRN
  Administered 2019-11-27: 60 ug/min via INTRAVENOUS

## 2019-11-27 MED ORDER — SODIUM CHLORIDE 0.9% IV SOLUTION: Freq: Once | INTRAVENOUS | Status: AC

## 2019-11-27 MED ORDER — HEPARIN (PORCINE) 25000 UT/250ML-% IV SOLN
800.0000 [IU]/h | INTRAVENOUS | Status: DC
  Administered 2019-11-27: 800 [IU]/h via INTRAVENOUS
  Filled 2019-11-27: qty 250

## 2019-11-27 MED ORDER — GLUCAGON HCL RDNA (DIAGNOSTIC) 1 MG IJ SOLR
INTRAMUSCULAR | Status: DC | PRN
  Administered 2019-11-27: .5 mg via INTRAVENOUS

## 2019-11-27 MED ORDER — PROPOFOL 10 MG/ML IV BOLUS
INTRAVENOUS | Status: DC | PRN
  Administered 2019-11-27 (×2): 30 mg via INTRAVENOUS

## 2019-11-27 MED ORDER — SODIUM CHLORIDE 0.9 % IV SOLN: INTRAVENOUS | Status: AC

## 2019-11-27 MED ORDER — LACTATED RINGERS IV SOLN: INTRAVENOUS | Status: DC | PRN

## 2019-11-27 MED ORDER — PHENYLEPHRINE 40 MCG/ML (10ML) SYRINGE FOR IV PUSH (FOR BLOOD PRESSURE SUPPORT)
PREFILLED_SYRINGE | INTRAVENOUS | Status: DC | PRN
  Administered 2019-11-27 (×5): 80 ug via INTRAVENOUS

## 2019-11-27 MED ORDER — LACTATED RINGERS IV SOLN: INTRAVENOUS | Status: DC

## 2019-11-27 MED ORDER — GLUCAGON HCL RDNA (DIAGNOSTIC) 1 MG IJ SOLR
INTRAMUSCULAR | Status: AC
  Filled 2019-11-27: qty 1

## 2019-11-27 MED ORDER — POTASSIUM CHLORIDE 10 MEQ/100ML IV SOLN
10.0000 meq | INTRAVENOUS | Status: AC
  Administered 2019-11-27 (×2): 10 meq via INTRAVENOUS
  Filled 2019-11-27 (×2): qty 100

## 2019-11-27 SURGICAL SUPPLY — 25 items

## 2019-11-27 NOTE — ED Notes (Signed)
Called MD spoke with doctor Sharlet Salina due to pt. Having rectal bleeding. 1 dose of MOVIPREP given.  New orders provided. Pt. A&O x4  Bp is 110/64  pulse 88.

## 2019-11-27 NOTE — Progress Notes (Signed)
PROGRESS NOTE  [name] YVO:[id number] DOB: 1950-10-23 DOA: 11/26/2019 PCP: Jesse Osmond, DO  HPI/Recap of past 24 hours: HPI from Dr [name] is a 69 y.o. male with history of A. fib and mechanical heart valve on Coumadin, diastolic CHF, CKD-4, chronic venous insufficiency, HTN, BPH, cirrhosis and hepatitis C presenting with rectal bleeding. Patient had recent surveillance colonoscopy and screening EGD on 11/19/2019. He had a couple of gastric polyps and a couple of colonic polyps which were resected.  Biopsies were negative for high-grade dysplasia or H. pylori.  He was restarted on warfarin with Lovenox bridge.  Since then, pt has been noticing intermittent bleeding (bright red blood per rectum especially with stool). Pt reported feeling lightheaded/dizzy. Pt received [employer] COVID-19 vaccine in March 2021.  Patient has not taken his warfarin in 2 days PTA. In ED, hemodynamically stable.  Hgb 8.4 (from 10.2 recently). INR 2.1.  COVID-19 PCR negative. GI consulted.  One unit of blood ordered.  Hospitalist service was called for admission.    Today, saw patient after colonoscopy, denies any new complaints. No further BRBPR noted.    Assessment/Plan: Active Problems:   Acute blood loss anemia  Lower GI bleed likely 2/2 post polypectomy bleed Acute symptomatic blood loss anemia Baseline hemoglobin around 10.2, 8.4 on admission On Coumadin for mechanical heart valve/A. Fib GI consulted, s/p colonoscopy on 11/27/2019, noted to have a poor prep.  Blood noted in the sigmoid colon and descending colon, single ulcer in the rectosigmoid colon, clips placed on polypectomy sites for further hemostasis GI recommended clear liquid diet, and to resume heparin without bolus at approximately 6:30 PM on 11/27/2019 Patient has received a total of about 3 units of PRBC during this admission Daily CBC  Paroxysmal A. fib/history of RHD status post mechanical mitral  valve Heart rate somewhat controlled Start IV heparin without bolus, continue to hold Coumadin Monitor closely  AKI on CKD stage IV Baseline creatinine around 2.4 Continue IV gentle hydration Hold home diuretics Daily BMP  Hypokalemia Replace as needed  Chronic diastolic HF/RV failure Appears euvolemic Echo with EF of 60 to 65% Hold home diuretics for now, reevaluate once stable  History of cirrhosis due to hep C/thrombocytopenia Appears compensated Hold diuretic as mentioned above  Hypertension BP currently soft Due to GI bleed, soft BP, will hold home antihypertensives  Severe malnutrition/deconditioning Dietitian consulted PT/OT        Malnutrition Type:      Malnutrition Characteristics:      Nutrition Interventions:       Estimated body mass index is 17.55 kg/m as calculated from the following:   Height as of this encounter: '6\' 1"'  (1.854 m).   Weight as of this encounter: 60.3 kg.     Code Status: Full  Family Communication: Disscussed with wife at bedside  Disposition Plan: Status is: Inpatient  Remains inpatient appropriate because:Inpatient level of care appropriate due to severity of illness   Dispo: The patient is from: Home              Anticipated d/c is to: Home              Anticipated d/c date is: 2 days              Patient currently is not medically stable to d/c.    Consultants:  GI  Procedures:  Colonoscopy on 11/27/2019  Antimicrobials:  None  DVT prophylaxis: Heparin drip   Objective:  Vitals:   11/27/19 1305 11/27/19 1310 11/27/19 1315 11/27/19 1347  BP: 98/64 101/61 (!) 99/57 95/75  Pulse: 88 87 (!) 174 79  Resp: (!) 21 20 (!) 22 16  Temp:    97.7 F (36.5 C)  TempSrc:      SpO2: 98% 95% 100%   Weight:      Height:        Intake/Output Summary (Last 24 hours) at 11/27/2019 1514 Last data filed at 11/27/2019 1243 Gross per 24 hour  Intake 2616.61 ml  Output --  Net 2616.61 ml   Filed  Weights   11/26/19 1631 11/27/19 0958  Weight: 60.3 kg 60.3 kg    Exam:  General: NAD   Cardiovascular: S1, S2 present  Respiratory: CTAB  Abdomen: Soft, nontender, nondistended, bowel sounds present  Musculoskeletal: No bilateral pedal edema noted  Skin: Normal  Psychiatry: Normal mood   Data Reviewed: CBC: Recent Labs  Lab 11/26/19 1318 11/26/19 1327 11/26/19 1943 11/27/19 0243  WBC 5.9  --  6.3 8.4  NEUTROABS 4.0  --   --   --   HGB 8.4* 9.2* 7.5* 7.0*  HCT 26.7* 27.0* 23.8* 22.1*  MCV 106.8*  --  103.0* 103.8*  PLT 139*  --  90* 941*   Basic Metabolic Panel: Recent Labs  Lab 11/26/19 1318 11/26/19 1327  NA 138 140  K 3.2* 3.2*  CL 101 99  CO2 25  --   GLUCOSE 106* 102*  BUN 36* 36*  CREATININE 2.69* 2.70*  CALCIUM 9.1  --    GFR: Estimated Creatinine Clearance: 22.6 mL/min (A) (by C-G formula based on SCr of 2.7 mg/dL (H)). Liver Function Tests: Recent Labs  Lab 11/26/19 1318  AST 112*  ALT 36  ALKPHOS 45  BILITOT 2.8*  PROT 7.8  ALBUMIN 3.9   No results for input(s): LIPASE, AMYLASE in the last 168 hours. No results for input(s): AMMONIA in the last 168 hours. Coagulation Profile: Recent Labs  Lab 11/26/19 1318  INR 2.1*   Cardiac Enzymes: No results for input(s): CKTOTAL, CKMB, CKMBINDEX, TROPONINI in the last 168 hours. BNP (last 3 results) No results for input(s): PROBNP in the last 8760 hours. HbA1C: No results for input(s): HGBA1C in the last 72 hours. CBG: No results for input(s): GLUCAP in the last 168 hours. Lipid Profile: No results for input(s): CHOL, HDL, LDLCALC, TRIG, CHOLHDL, LDLDIRECT in the last 72 hours. Thyroid Function Tests: No results for input(s): TSH, T4TOTAL, FREET4, T3FREE, THYROIDAB in the last 72 hours. Anemia Panel: Recent Labs    11/26/19 1943  VITAMINB12 728  FOLATE 45.4  FERRITIN 56  TIBC 283  IRON 75  RETICCTPCT 7.9*   Urine analysis: No results found for: COLORURINE, APPEARANCEUR,  LABSPEC, PHURINE, GLUCOSEU, HGBUR, BILIRUBINUR, KETONESUR, PROTEINUR, UROBILINOGEN, NITRITE, LEUKOCYTESUR Sepsis Labs: '@LABRCNTIP' (procalcitonin:4,lacticidven:4)  ) Recent Results (from the past 240 hour(s))  SARS Coronavirus 2 by RT PCR (hospital order, performed in [facility] hospital lab) Nasopharyngeal Nasopharyngeal Swab     Status: None   Collection Time: 11/26/19  1:53 PM   Specimen: Nasopharyngeal Swab  Result Value Ref Range Status   SARS Coronavirus 2 NEGATIVE NEGATIVE Final    Comment: (NOTE) SARS-CoV-2 target nucleic acids are NOT DETECTED.  The SARS-CoV-2 RNA is generally detectable in upper and lower respiratory specimens during the acute phase of infection. The lowest concentration of SARS-CoV-2 viral copies this assay can detect is 250 copies / mL. A negative result does not preclude SARS-CoV-2 infection  and should not be used as the sole basis for treatment or other patient management decisions.  A negative result may occur with improper specimen collection / handling, submission of specimen other than nasopharyngeal swab, presence of viral mutation(s) within the areas targeted by this assay, and inadequate number of viral copies (<250 copies / mL). A negative result must be combined with clinical observations, patient history, and epidemiological information.  Fact Sheet for Patients:   StrictlyIdeas.no  Fact Sheet for Healthcare Providers: BankingDealers.co.za  This test is not yet approved or  cleared by the [place] FDA and has been authorized for detection and/or diagnosis of SARS-CoV-2 by FDA under an Emergency Use Authorization (EUA).  This EUA will remain in effect (meaning this test can be used) for the duration of the COVID-19 declaration under Section 564(b)(1) of the Act, 21 U.S.C. section 360bbb-3(b)(1), unless the authorization is terminated or revoked sooner.  Performed at [facility], [place] [address] [place], [place] [address]       Studies: No results found.  Scheduled Meds: . sodium chloride   Intravenous Once  . peg 3350 powder  0.5 kit Oral Once    Continuous Infusions: . sodium chloride Stopped (11/26/19 2013)  . lactated ringers       LOS: 1 day     Alma Friendly, MD [facility]  If 7PM-7AM, please contact night-coverage [email or web address] 11/27/2019, 3:14 PM

## 2019-11-27 NOTE — Progress Notes (Signed)
\   PT Cancellation Note  Patient Details Name: ZYIER DYKEMA MRN: [id number] DOB: 23-Jun-1950   Cancelled Treatment:    Reason Eval/Treat Not Completed: Patient at procedure or test/unavailable   Claretha Cooper 11/27/2019, 12:48 PM  Tresa Endo PT Acute Rehabilitation Services Pager [phone] Office [phone]

## 2019-11-27 NOTE — Interval H&P Note (Signed)
History and Physical Interval Note:  11/27/2019 11:28 AM  [name]  has presented today for surgery, with the diagnosis of gastrointestinal bleeding.  The various methods of treatment have been discussed with the patient and family. After consideration of risks, benefits and other options for treatment, the patient has consented to  Procedure(s): COLONOSCOPY WITH PROPOFOL (N/A) ESOPHAGOGASTRODUODENOSCOPY (EGD) WITH PROPOFOL (N/A) as a surgical intervention.  The patient's history has been reviewed, patient examined, no change in status, stable for surgery.  I have reviewed the patient's chart and labs.  Questions were answered to the patient's satisfaction.    After initial delay, procedure to begin.  Second unit of PRBCs running, anesthesia has evaluated patient and [name] remains stable.  Nelida Meuse III

## 2019-11-27 NOTE — Anesthesia Postprocedure Evaluation (Signed)
Anesthesia Post Note  Patient: [name]  Procedure(s) Performed: COLONOSCOPY WITH PROPOFOL (N/A ) ESOPHAGOGASTRODUODENOSCOPY (EGD) WITH PROPOFOL (N/A ) HEMOSTASIS CLIP PLACEMENT     Patient location during evaluation: PACU Anesthesia Type: MAC Level of consciousness: awake and alert Pain management: pain level controlled Vital Signs Assessment: post-procedure vital signs reviewed and stable Respiratory status: spontaneous breathing, nonlabored ventilation, respiratory function stable and patient connected to nasal cannula oxygen Cardiovascular status: stable and blood pressure returned to baseline Postop Assessment: no apparent nausea or vomiting Anesthetic complications: no   No complications documented.  Last Vitals:  Vitals:   11/27/19 1315 11/27/19 1347  BP: (!) 99/57 95/75  Pulse: (!) 174 79  Resp: (!) 22 16  Temp:  36.5 C  SpO2: 100%     Last Pain:  Vitals:   11/27/19 1243  TempSrc: Oral  PainSc:                  Effie Berkshire

## 2019-11-27 NOTE — Progress Notes (Addendum)
ANTICOAGULATION CONSULT NOTE - Initial Consult  Pharmacy Consult for IV heparin Indication: Mechanical mitral valve; GI bleeding  Allergies  Allergen Reactions  . Penicillins Rash    Did it involve swelling of the face/tongue/throat, SOB, or low BP? No Did it involve sudden or severe rash/hives, skin peeling, or any reaction on the inside of your mouth or nose? No Did you need to seek medical attention at a hospital or doctor's office? No When did it last happen?30 + years If all above answers are "NO", may proceed with cephalosporin use.     Patient Measurements: Height: '6\' 1"'  (185.4 cm) Weight: 60.3 kg (133 lb) IBW/kg (Calculated) : 79.9 Heparin Dosing Weight: TBW  Vital Signs: Temp: 97.7 F (36.5 C) (08/24 1347) Temp Source: Oral (08/24 1243) BP: 95/75 (08/24 1347) Pulse Rate: 79 (08/24 1347)  Labs: Recent Labs    11/26/19 1318 11/26/19 1318 11/26/19 1327 11/26/19 1327 11/26/19 1943 11/27/19 0243  HGB 8.4*   < > 9.2*   < > 7.5* 7.0*  HCT 26.7*   < > 27.0*  --  23.8* 22.1*  PLT 139*  --   --   --  90* 101*  LABPROT 22.4*  --   --   --   --   --   INR 2.1*  --   --   --   --   --   CREATININE 2.69*  --  2.70*  --   --   --    < > = values in this interval not displayed.    Estimated Creatinine Clearance: 22.6 mL/min (A) (by C-G formula based on SCr of 2.7 mg/dL (H)).   Medical History: Past Medical History:  Diagnosis Date  . Allergy   . Aneurysm of ascending aorta (HCC)   . Atrial fibrillation ([place])   . BPH (benign prostatic hyperplasia)   . Colon polyps   . FHx: rheumatic heart disease   . H/O diplopia   . Hepatitis C   . Hypertension   . Hypogonadism male   . Mitral valve disease    mitral valve repair/replacement x3    Medications:  Medications Prior to Admission  Medication Sig Dispense Refill Last Dose  . amoxicillin (AMOXIL) 500 MG capsule Take 4 capsules by mouth 1 hour prior to dental appointment (Patient taking differently: Take  2,000 mg by mouth See admin instructions. Take 4 capsules (2000 mg) by mouth 1 hour prior to dental appointment) 4 capsule 0 unknown  . enoxaparin (LOVENOX) 60 MG/0.6ML injection Inject 0.6 mLs (60 mg total) into the skin daily. 6 mL 1 11/24/2019 at unknown time  . ferrous sulfate 325 (65 FE) MG tablet Take 325 mg by mouth every other day. In the evening.   Past Week at Unknown time  . furosemide (LASIX) 40 MG tablet Take 1 tablet (40 mg total) by mouth daily. (Patient taking differently: Take 40 mg by mouth every evening. ) 30 tablet 4 Past Week at Unknown time  . metolazone (ZAROXOLYN) 2.5 MG tablet Take 1 tablet (2.5 mg total) by mouth daily as needed. (Patient taking differently: Take 2.5 mg by mouth daily as needed (severe fluid retention.). ) 15 tablet 3 unknown  . Multiple Vitamin (MULTIVITAMIN WITH MINERALS) TABS tablet Take 1 tablet by mouth daily.   Past Week at Unknown time  . polyethylene glycol (MIRALAX) 17 g packet Take 17 g by mouth daily as needed. (Patient taking differently: Take 17 g by mouth every three (3) days as  needed (constipation.). ) 14 each 0 unknown  . sildenafil (REVATIO) 20 MG tablet Take 20 mg by mouth daily as needed (ed).    unknown  . spironolactone (ALDACTONE) 25 MG tablet Take 0.5 tablets (12.5 mg total) by mouth daily. 15 tablet 3 Past Week at Unknown time  . testosterone cypionate (DEPOTESTOSTERONE CYPIONATE) 200 MG/ML injection Inject 60 mg into the muscle every Thursday. 0.3 ml every week   Past Week at Unknown time  . warfarin (COUMADIN) 5 MG tablet Take 0.5-1 tablets (2.5-5 mg total) by mouth See admin instructions. Take 0.5 tablet (2.5 mg) by mouth on Thursdays & Saturdays, take 1 tablet (5 mg) by mouth on Sundays, Mondays, Tuesdays, Wednesdays, & Fridays. 90 tablet 0 11/24/2019 at unknown time   Scheduled:  . peg 3350 powder  0.5 kit Oral Once   Infusions:  . sodium chloride    . heparin    . lactated ringers      Assessment: 42 yoM with PMH Afib and  mech MV on warfarin, dCHF, CKD4, cirrhosis and hepC, presents with rectal bleeding after colonoscopy with polypectomy on 8/16. Patient had bridged warfarin with Lovenox periprocedurally, and noticed several episodes of hematochezia after resuming warfarin postop. On 8/21, patient stopped taking both warfarin and Lovenox d/t recurrent rectal bleeding, and was admitted on 8/23 for same. During the current admission, he underwent a repeat colonoscopy with attempted repair of the ulcerated polypectomy site. Patient transfused multiple units of blood this admission. Given need for stroke prophylaxis in setting of active GI bleed, Pharmacy consulted to start IV heparin w/o bolus 6 hrs after colonoscopy.   Baseline INR subtherapeutic after holding warfarin, aPTT WNL  Prior anticoagulation: warfarin 5 mg daily except 2.5 mg on Sunday; Lovenox 1 mg/kg daily (with CrCl < 30); last dose of both on 8/21  Significant events:  Today, 11/27/2019:  CBC: Hgb low but improved after multiple units of PRBC; Plt low but improved to baseline; chronically low d/t cirrhosis  AKI on CKD (baseline ~2.0); SCr trending up  No bleeding or infusion issues per nursing  Goal of Therapy: Heparin level 0.3-0.5 units/ml until active bleeding resolved Monitor platelets by anticoagulation protocol: Yes  Plan:  Heparin 800 units/hr IV infusion, no bolus  Check heparin level 8 hrs after start  Daily CBC and INR, daily heparin level once stable  Monitor for signs of bleeding or thrombosis  Reuel Boom, PharmD, BCPS [phone] 11/27/2019, 3:58 PM

## 2019-11-27 NOTE — Transfer of Care (Signed)
Immediate Anesthesia Transfer of Care Note  Patient: [name]  Procedure(s) Performed: COLONOSCOPY WITH PROPOFOL (N/A ) ESOPHAGOGASTRODUODENOSCOPY (EGD) WITH PROPOFOL (N/A ) HEMOSTASIS CLIP PLACEMENT  Patient Location: PACU  Anesthesia Type:MAC  Level of Consciousness: drowsy and patient cooperative  Airway & Oxygen Therapy: Patient Spontanous Breathing and Patient connected to face mask oxygen  Post-op Assessment: Report given to RN and Post -op Vital signs reviewed and stable  Post vital signs: Reviewed and stable  Last Vitals:  Vitals Value Taken Time  BP    Temp    Pulse    Resp    SpO2      Last Pain:  Vitals:   11/27/19 1100  TempSrc:   PainSc: 0-No pain         Complications: No complications documented.

## 2019-11-27 NOTE — Interval H&P Note (Signed)
History and Physical Interval Note:  11/27/2019 10:48 AM  [name]  has presented today for surgery, with the diagnosis of gastrointestinal bleeding.  The various methods of treatment have been discussed with the patient and family. After consideration of risks, benefits and other options for treatment, the patient has consented to  Procedure(s): COLONOSCOPY WITH PROPOFOL (N/A) ESOPHAGOGASTRODUODENOSCOPY (EGD) WITH PROPOFOL (N/A) as a surgical intervention.  The patient's history has been reviewed, patient examined, no change in status, stable for surgery.  I have reviewed the patient's chart and labs.  Questions were answered to the patient's satisfaction.    Patient continued to bleed overnight into early this morning.  Our service communicated with the ED and coordinated blood transfusion.  Patient has remained hemodynamically stable.  He is currently on his first of 2 planned units of PRBC transfusion. We are proceeding with colonoscopy for control of bleeding, and only doing EGD if no source found on colonoscopy.  Nelida Meuse III

## 2019-11-27 NOTE — Anesthesia Preprocedure Evaluation (Addendum)
Anesthesia Evaluation  Patient identified by MRN, date of birth, ID band Patient awake    Reviewed: Allergy & Precautions, NPO status , Patient's Chart, lab work & pertinent test results  Airway Mallampati: I  TM Distance: >3 FB Neck ROM: Full    Dental  (+) Teeth Intact, Dental Advisory Given, Chipped,    Pulmonary former smoker,    Pulmonary exam normal        Cardiovascular hypertension, Pt. on medications + dysrhythmias Atrial Fibrillation  Rhythm:Regular Rate:Tachycardia     Neuro/Psych negative neurological ROS  negative psych ROS   GI/Hepatic negative GI ROS, (+) Hepatitis -, C  Endo/Other    Renal/GU      Musculoskeletal   Abdominal   Peds  Hematology  (+) anemia ,   Anesthesia Other Findings   Reproductive/Obstetrics                            Anesthesia Physical Anesthesia Plan  ASA: IV  Anesthesia Plan: MAC   Post-op Pain Management:    Induction: Intravenous  PONV Risk Score and Plan: 0 and Propofol infusion  Airway Management Planned:   Additional Equipment: None  Intra-op Plan:   Post-operative Plan:   Informed Consent: I have reviewed the patients History and Physical, chart, labs and discussed the procedure including the risks, benefits and alternatives for the proposed anesthesia with the patient or authorized representative who has indicated his/her understanding and acceptance.       Plan Discussed with: CRNA  Anesthesia Plan Comments: (Transfuse 2U PRBC's  Lab Results      Component                Value               Date                      WBC                      8.4                 11/27/2019                HGB                      7.0 (L)             11/27/2019                HCT                      22.1 (L)            11/27/2019                MCV                      103.8 (H)           11/27/2019                PLT                       101 (L)             11/27/2019           )       Anesthesia Quick Evaluation

## 2019-11-27 NOTE — Op Note (Signed)
[facility] Patient Name: [name] Procedure Date: 11/27/2019 MRN: [id number] Attending MD: Estill Cotta. Loletha Carrow , MD Date of Birth: [birth year] CSN: [id number] Age: 69 Admit Type: Inpatient Procedure:                Colonoscopy Indications:              Hematochezia, Acute post hemorrhagic anemia -                            suspected post polypectomy bleed from outpatient                            colonoscopy 11/19/19 in patient on lovenox and                            coumadin for mechanical mitral valve Providers:                Mallie Mussel L. Loletha Carrow, MD, Cleda Daub, RN, Cherylynn Ridges, Technician, Caryl Pina CRNA Referring MD:             [facility] Medicines:                Monitored Anesthesia Care Complications:            No immediate complications. Estimated Blood Loss:     Estimated blood loss was minimal. Procedure:                Pre-Anesthesia Assessment:                           - Prior to the procedure, a History and Physical                            was performed, and patient medications and                            allergies were reviewed. The patient's tolerance of                            previous anesthesia was also reviewed. The risks                            and benefits of the procedure and the sedation                            options and risks were discussed with the patient.                            All questions were answered, and informed consent                            was obtained. Prior Anticoagulants: The patient has  taken Lovenox (enoxaparin) and coumadin last doses                            3 days prior to procedure. ASA Grade Assessment: IV                            - A patient with severe systemic disease that is a                            constant threat to life. After reviewing the risks                            and benefits, the patient was deemed in                             satisfactory condition to undergo the procedure.                           After obtaining informed consent, the colonoscope                            was passed under direct vision. Throughout the                            procedure, the patient's blood pressure, pulse, and                            oxygen saturations were monitored continuously. The                            PCF-H190DL ([id number] Olympus pediatric colonscope                            was introduced through the anus and advanced to the                            the cecum, identified by appendiceal orifice and                            ileocecal valve. The colonoscopy was performed with                            difficulty due to poor bowel prep. The patient                            tolerated the procedure well. The quality of the                            bowel preparation was poor. The ileocecal valve,                            appendiceal orifice, and rectum were photographed. Scope In: 11:36:53 AM Scope Out: 12:15:58 PM Scope Withdrawal Time:  0 hours 33 minutes 13 seconds  Total Procedure Duration: 0 hours 39 minutes 5 seconds  Findings:      The perianal and digital rectal examinations were normal.      Red blood was found in the sigmoid colon and in the descending colon.      Semi-liquid stool was found in the entire colon, interfering with       visualization.      A single (solitary) six to eight mm ulcer was found in the recto-sigmoid       colon.(polypectomy site, about 18 cm from anal verge)) No bleeding was       present. Stigmata of recent bleeding were present with friable tissue       and clot on the existing clip. For hemostasis, four hemostatic clips       were successfully placed (MR conditional).2 clips across base, 2 across       width of ulcer. Challenging location. There was no bleeding at the end       of the procedure.      The exam was otherwise without  abnormality. Right colon cold snare and       left colon biopsy sites could not be identified. Impression:               - Preparation of the colon was poor.                           - Blood in the sigmoid colon and in the descending                            colon.                           - Stool in the entire examined colon.                           - A single (solitary) ulcer in the recto-sigmoid                            colon. Clips (MR conditional) were placed on                            polypectomy site.                           - The examination was otherwise normal.                           - No specimens collected. Moderate Sedation:      MAC sedation used Recommendation:           - Return patient to hospital ward for ongoing care.                           - Clear liquid diet.                           - At approximately 630 pm today (6 hour after  procedure completion), begin unfractionaed IV                            heparin without bolus.                           CBC Q 6 hours x 4.                           PT/INR with next scheduled blood draw.                           (Wife was updated by phone) Procedure Code(s):        --- Professional ---                           [phone], Colonoscopy, flexible; with control of                            bleeding, any method Diagnosis Code(s):        --- Professional ---                           K92.2, Gastrointestinal hemorrhage, unspecified                           K63.3, Ulcer of intestine                           K92.1, Melena (includes Hematochezia)                           D62, Acute posthemorrhagic anemia CPT copyright 2019 American [employer] Association. All rights reserved. The codes documented in this report are preliminary and upon coder review may  be revised to meet current compliance requirements. Jasmarie Coppock L. Loletha Carrow, MD 11/27/2019 12:34:01 PM This report has been signed  electronically. Number of Addenda: 0

## 2019-11-28 ENCOUNTER — Encounter (HOSPITAL_COMMUNITY): Payer: Self-pay | Admitting: Gastroenterology

## 2019-11-28 DIAGNOSIS — K922 Gastrointestinal hemorrhage, unspecified: Secondary | ICD-10-CM

## 2019-11-28 LAB — BASIC METABOLIC PANEL
Anion gap: 11 (ref 5–15)
BUN: 38 mg/dL — ABNORMAL HIGH (ref 8–23)
CO2: 21 mmol/L — ABNORMAL LOW (ref 22–32)
Calcium: 7.7 mg/dL — ABNORMAL LOW (ref 8.9–10.3)
Chloride: 104 mmol/L (ref 98–111)
Creatinine, Ser: 2.67 mg/dL — ABNORMAL HIGH (ref 0.61–1.24)
GFR calc Af Amer: 27 mL/min — ABNORMAL LOW (ref >60)
GFR calc non Af Amer: 24 mL/min — ABNORMAL LOW (ref >60)
Glucose, Bld: 100 mg/dL — ABNORMAL HIGH (ref 70–99)
Potassium: 3.3 mmol/L — ABNORMAL LOW (ref 3.5–5.1)
Sodium: 136 mmol/L (ref 135–145)

## 2019-11-28 LAB — CBC WITH DIFFERENTIAL/PLATELET
Abs Immature Granulocytes: 0.03 10*3/uL (ref 0.00–0.07)
Basophils Absolute: 0.1 10*3/uL (ref 0.0–0.1)
Basophils Relative: 1 %
Eosinophils Absolute: 0.5 10*3/uL (ref 0.0–0.5)
Eosinophils Relative: 7 %
HCT: 25.2 % — ABNORMAL LOW (ref 39.0–52.0)
Hemoglobin: 8.2 g/dL — ABNORMAL LOW (ref 13.0–17.0)
Immature Granulocytes: 0 %
Lymphocytes Relative: 12 %
Lymphs Abs: 0.8 10*3/uL (ref 0.7–4.0)
MCH: 31.8 pg (ref 26.0–34.0)
MCHC: 32.5 g/dL (ref 30.0–36.0)
MCV: 97.7 fL (ref 80.0–100.0)
Monocytes Absolute: 0.7 10*3/uL (ref 0.1–1.0)
Monocytes Relative: 9 %
Neutro Abs: 5.2 10*3/uL (ref 1.7–7.7)
Neutrophils Relative %: 71 %
Platelets: 75 10*3/uL — ABNORMAL LOW (ref 150–400)
RBC: 2.58 MIL/uL — ABNORMAL LOW (ref 4.22–5.81)
RDW: 23.7 % — ABNORMAL HIGH (ref 11.5–15.5)
WBC: 7.3 10*3/uL (ref 4.0–10.5)
nRBC: 0 % (ref 0.0–0.2)

## 2019-11-28 LAB — HEPARIN LEVEL (UNFRACTIONATED): Heparin Unfractionated: 0.26 IU/mL — ABNORMAL LOW (ref 0.30–0.70)

## 2019-11-28 LAB — PROTIME-INR
INR: 2.6 — ABNORMAL HIGH (ref 0.8–1.2)
Prothrombin Time: 27.3 seconds — ABNORMAL HIGH (ref 11.4–15.2)

## 2019-11-28 MED ORDER — PROSOURCE PLUS PO LIQD
30.0000 mL | Freq: Every day | ORAL | Status: DC
  Administered 2019-11-29: 30 mL via ORAL
  Filled 2019-11-28: qty 30

## 2019-11-28 MED ORDER — KATE FARMS STANDARD 1.4 PO LIQD
325.0000 mL | Freq: Every day | ORAL | Status: DC
  Administered 2019-11-28: 325 mL via ORAL
  Filled 2019-11-28 (×2): qty 325

## 2019-11-28 MED ORDER — ENOXAPARIN SODIUM 60 MG/0.6ML ~~LOC~~ SOLN
60.0000 mg | Freq: Every day | SUBCUTANEOUS | Status: DC
  Administered 2019-11-28 – 2019-11-29 (×2): 60 mg via SUBCUTANEOUS
  Filled 2019-11-28 (×2): qty 0.6

## 2019-11-28 MED ORDER — ADULT MULTIVITAMIN W/MINERALS CH
1.0000 | ORAL_TABLET | Freq: Every day | ORAL | Status: DC
  Administered 2019-11-28 – 2019-11-29 (×2): 1 via ORAL
  Filled 2019-11-28 (×2): qty 1

## 2019-11-28 MED ORDER — HEPARIN (PORCINE) 25000 UT/250ML-% IV SOLN: 900.0000 [IU]/h | INTRAVENOUS | Status: DC

## 2019-11-28 MED ORDER — SODIUM CHLORIDE 0.9 % IV SOLN: INTRAVENOUS | Status: DC

## 2019-11-28 MED ORDER — POTASSIUM CHLORIDE CRYS ER 20 MEQ PO TBCR
40.0000 meq | EXTENDED_RELEASE_TABLET | Freq: Every day | ORAL | Status: DC
  Administered 2019-11-28 – 2019-11-29 (×2): 40 meq via ORAL
  Filled 2019-11-28 (×2): qty 2

## 2019-11-28 NOTE — Progress Notes (Signed)
Initial Nutrition Assessment  DOCUMENTATION CODES:   Non-severe (moderate) malnutrition in context of chronic illness, Underweight  INTERVENTION:  - will order [employer] 1.4 po once/day, each supplement provides 455 kcal and 20 grams protein. - will order 30 mL Prosource Plus once/day, each supplement provides 100 kcal and 15 grams of protein. - will order 1 tablet multivitamin with minerals/day.   NUTRITION DIAGNOSIS:   Moderate Malnutrition related to chronic illness (cirrhosis) as evidenced by mild fat depletion, mild muscle depletion, moderate fat depletion, moderate muscle depletion.  GOAL:   Patient will meet greater than or equal to 90% of their needs  MONITOR:   PO intake, Supplement acceptance, Labs, Weight trends  REASON FOR ASSESSMENT:   Consult Assessment of nutrition requirement/status  ASSESSMENT:   69 y.o. male with medical history of A. fib and mechanical heart valve on Coumadin, CHF, stage 4 CKD, chronic venous insufficiency, HTN, BPH, cirrhosis, and hepatitis C. He presented to the ED due to rectal bleeding. He underwent a colonoscopy and EGD on 11/19/19 during which several gastric polyps and colonic polyps were removed. He had bleeding on 8/17 which then stopped and resumed on 8/20. He received the COVID-19 vaccine in March.  He ate 75% of breakfast this AM (490 kcal, 13 grams protein). He has had a decrease in appetite over the past 5-6 days, but does not feel that his weight has changed during that time frame. Denies abdominal pain or nausea now or PTA. No discomfort or difficulty with eating or drinking.   Weight yesterday was 133 lb and weight on 8/11 was 138 lb. This would indicate 5 lb weight loss (3.6% body weight) in the past 2 weeks; significant for time frame. Weight on 7/20 and 6/30 were both 133 lb also.   Per notes: - acute blood loss anemia  - AKI on stage 4 CKD - s/p repeat colonoscopy 8/24   Labs reviewed; K: 3.3 mmol/l, BUN: 38 mg/dl,  creatinine: 2.67 mg/dl, Ca: 7.7 mg/dl, GFR: 27 ml/min. Medications reviewed; 40 mEq Klor-Con/day.  IVF; NS @ 50 ml/hr.    NUTRITION - FOCUSED PHYSICAL EXAM:    Most Recent Value  Orbital Region Mild depletion  Upper Arm Region Mild depletion  Thoracic and Lumbar Region Unable to assess  Buccal Region Mild depletion  Temple Region Moderate depletion  Clavicle Bone Region Moderate depletion  Clavicle and Acromion Bone Region Moderate depletion  Scapular Bone Region Mild depletion  Dorsal Hand Mild depletion  Patellar Region Moderate depletion  Anterior Thigh Region Unable to assess  Posterior Calf Region Mild depletion  Edema (RD Assessment) None  Hair Reviewed  Eyes Reviewed  Mouth Reviewed  Skin Reviewed  Nails Reviewed       Diet Order:   Diet Order            Diet regular Room service appropriate? Yes; Fluid consistency: Thin  Diet effective now                 EDUCATION NEEDS:   No education needs have been identified at this time  Skin:  Skin Assessment: Reviewed RN Assessment  Last BM:  8/23  Height:   Ht Readings from Last 1 Encounters:  11/27/19 6\' 1"  (1.854 m)    Weight:   Wt Readings from Last 1 Encounters:  11/27/19 60.3 kg     Estimated Nutritional Needs:  Kcal:  1930-2110 kcal Protein:  95-105 grams Fluid:  >/= 2.1 L/day     Jarome Matin, MS,  RD, LDN, CNSC Inpatient Clinical Dietitian RD pager # available in [place]  After hours/weekend pager # available in [facility]

## 2019-11-28 NOTE — Plan of Care (Signed)

## 2019-11-28 NOTE — Progress Notes (Signed)
[facility] Progress Note  Chief Complaint: Hematochezia  History:  [name] has done well overnight, with no recurrence of bleeding after his colonoscopy yesterday.  He has been on unfractionated heparin since early last evening. He denies abdominal pain chest pain dyspnea or dysuria he is hungry.   Objective:   Current Facility-Administered Medications:  .  0.9 %  sodium chloride infusion, , Intravenous, Continuous, Samtani, Jai-Gurmukh, MD .  acetaminophen (TYLENOL) tablet 650 mg, 650 mg, Oral, Q6H PRN **OR** acetaminophen (TYLENOL) suppository 650 mg, 650 mg, Rectal, Q6H PRN, Gonfa, Taye T, MD .  heparin ADULT infusion 100 units/mL (25000 units/248m sodium chloride 0.45%), 900 Units/hr, Intravenous, Continuous, EAlma Friendly MD, Last Rate: 9 mL/hr at 11/28/19 0627, 900 Units/hr at 11/28/19 0627 .  lactated ringers infusion, , Intravenous, Continuous, Danis, HEstill CottaIII, MD .  ondansetron (ZOFRAN) tablet 4 mg, 4 mg, Oral, Q6H PRN **OR** ondansetron (ZOFRAN) injection 4 mg, 4 mg, Intravenous, Q6H PRN, Gonfa, Taye T, MD .  oxyCODONE (Oxy IR/ROXICODONE) immediate release tablet 5 mg, 5 mg, Oral, Q4H PRN, GCyndia Skeeters Taye T, MD .  [COMPLETED] peg 3350 powder (MOVIPREP) kit 100 g, 0.5 kit, Oral, Once, 100 g at 11/26/19 2252 **AND** peg 3350 powder (MOVIPREP) kit 100 g, 0.5 kit, Oral, Once, Gonfa, Taye T, MD .  potassium chloride SA (KLOR-CON) CR tablet 40 mEq, 40 mEq, Oral, Daily, Samtani, Jai-Gurmukh, MD  . sodium chloride    . heparin 900 Units/hr (11/28/19 0627)  . lactated ringers       Vital signs in last 24 hrs: Vitals:   11/27/19 2005 11/28/19 0503  BP: (!) 94/55 94/60  Pulse: 76 73  Resp: 14 19  Temp: 97.8 F (36.6 C) 98.6 F (37 C)  SpO2: 100% 98%    Intake/Output Summary (Last 24 hours) at 11/28/2019 0847 Last data filed at 11/28/2019 00981Gross per 24 hour  Intake 1896.61 ml  Output 500 ml  Net 1396.61 ml     Physical Exam Thin with poor muscle mass as before.   Alert, conversational and pleasant.  HEENT: sclera anicteric, oral mucosa without lesions  Neck: supple, no thyromegaly, JVD or lymphadenopathy  Cardiac: Irregular with mechanical valve click, no peripheral edema  Pulm: clear to auscultation bilaterally, normal RR and effort noted  Abdomen: soft, no tenderness, with active bowel sounds. No guarding or palpable hepatosplenomegaly  Skin; warm and dry, no jaundice  Recent Labs:  CBC Latest Ref Rng & Units 11/28/2019 11/27/2019 11/27/2019  WBC 4.0 - 10.5 K/uL 7.3 8.5 8.4  Hemoglobin 13.0 - 17.0 g/dL 8.2(L) 9.8(L) 7.0(L)  Hematocrit 39 - 52 % 25.2(L) 30.1(L) 22.1(L)  Platelets 150 - 400 K/uL 75(L) 105(L) 101(L)    Recent Labs  Lab 11/28/19 0446  INR 2.6*   CMP Latest Ref Rng & Units 11/28/2019 11/27/2019 11/26/2019  Glucose 70 - 99 mg/dL 100(H) 113(H) 102(H)  BUN 8 - 23 mg/dL 38(H) 43(H) 36(H)  Creatinine 0.61 - 1.24 mg/dL 2.67(H) 3.30(H) 2.70(H)  Sodium 135 - 145 mmol/L 136 141 140  Potassium 3.5 - 5.1 mmol/L 3.3(L) 3.9 3.2(L)  Chloride 98 - 111 mmol/L 104 103 99  CO2 22 - 32 mmol/L 21(L) 22 -  Calcium 8.9 - 10.3 mg/dL 7.7(L) 8.1(L) -  Total Protein 6.5 - 8.1 g/dL - 6.6 -  Total Bilirubin 0.3 - 1.2 mg/dL - 2.0(H) -  Alkaline Phos 38 - 126 U/L - 35(L) -  AST 15 - 41 U/L - 109(H) -  ALT 0 -  44 U/L - 35 -     Assessment & Plan  Assessment:  Hematochezia Acute blood loss anemia Post polypectomy bleed complicated by anticoagulation for mechanical mitral valve.  No recurrence of bleeding after endoscopic control yesterday and over 12 hours on unfractionated heparin.  I think the hemoglobin of 9.8 yesterday afternoon was probably spuriously high, having been drawn soon after transfusion completed.  Plan: Discontinue IV heparin and start Lovenox per pharmacy protocol based on his renal function. Observe in hospital for another 24 hours.  If no recurrence of bleeding at that point, discharged home on Lovenox and  Coumadin.  Regular diet today  Total time 25 minutes  Nelida Meuse III Office: [phone]

## 2019-11-28 NOTE — Progress Notes (Signed)
PT Cancellation Note/ SCREEN  Patient Details Name: [name] MRN: [id number] DOB: [birth year]   Cancelled Treatment:    Reason Eval/Treat Not Completed: PT screened, no needs identified, will sign off  Pt is independent with mobility per OT.  PT to sign off.   Gomer France,KATHrine E 11/28/2019, 10:32 AM Arlyce Dice, DPT Acute Rehabilitation Services Pager: [phone] Office: [phone]

## 2019-11-28 NOTE — Progress Notes (Signed)
ANTICOAGULATION CONSULT NOTE - Consult  Pharmacy Consult for IV heparin Indication: Mechanical mitral valve; GI bleeding  Allergies  Allergen Reactions  . Penicillins Rash    Did it involve swelling of the face/tongue/throat, SOB, or low BP? No Did it involve sudden or severe rash/hives, skin peeling, or any reaction on the inside of your mouth or nose? No Did you need to seek medical attention at a hospital or doctor's office? No When did it last happen?30 + years If all above answers are "NO", may proceed with cephalosporin use.     Patient Measurements: Height: _0  (185.4 cm) Weight: 60.3 kg (133 lb) IBW/kg (Calculated) : 79.9 Heparin Dosing Weight: TBW  Vital Signs: Temp: 98.6 F (37 C) (08/25 0503) Temp Source: Oral (08/24 2005) BP: 94/60 (08/25 0503) Pulse Rate: 73 (08/25 0503)  Labs: Recent Labs    11/26/19 1318 11/26/19 1318 11/26/19 1327 11/26/19 1943 11/27/19 0243 11/27/19 0243 11/27/19 1436 11/28/19 0446  HGB 8.4*   < > 9.2*   < > 7.0*   < > 9.8* 8.2*  HCT 26.7*   < > 27.0*   < > 22.1*  --  30.1* 25.2*  PLT 139*  --   --    < > 101*  --  105* 75*  APTT  --   --   --   --   --   --  35  --   LABPROT 22.4*  --   --   --   --   --  23.7* 27.3*  INR 2.1*  --   --   --   --   --  2.2* 2.6*  HEPARINUNFRC  --   --   --   --   --   --   --  0.26*  CREATININE 2.69*   < > 2.70*  --   --   --  3.30* 2.67*   < > = values in this interval not displayed.    Estimated Creatinine Clearance: 22.9 mL/min (A) (by C-G formula based on SCr of 2.67 mg/dL (H)).   Medical History: Past Medical History:  Diagnosis Date  . Allergy   . Aneurysm of ascending aorta (HCC)   . Atrial fibrillation ([place])   . BPH (benign prostatic hyperplasia)   . Colon polyps   . FHx: rheumatic heart disease   . H/O diplopia   . Hepatitis C   . Hypertension   . Hypogonadism male   . Mitral valve disease    mitral valve repair/replacement x3    Medications:  Medications Prior  to Admission  Medication Sig Dispense Refill Last Dose  . amoxicillin (AMOXIL) 500 MG capsule Take 4 capsules by mouth 1 hour prior to dental appointment (Patient taking differently: Take 2,000 mg by mouth See admin instructions. Take 4 capsules (2000 mg) by mouth 1 hour prior to dental appointment) 4 capsule 0 unknown  . enoxaparin (LOVENOX) 60 MG/0.6ML injection Inject 0.6 mLs (60 mg total) into the skin daily. 6 mL 1 11/24/2019 at unknown time  . ferrous sulfate 325 (65 FE) MG tablet Take 325 mg by mouth every other day. In the evening.   Past Week at Unknown time  . furosemide (LASIX) 40 MG tablet Take 1 tablet (40 mg total) by mouth daily. (Patient taking differently: Take 40 mg by mouth every evening. ) 30 tablet 4 Past Week at Unknown time  . metolazone (ZAROXOLYN) 2.5 MG tablet Take 1 tablet (2.5 mg total) by mouth  daily as needed. (Patient taking differently: Take 2.5 mg by mouth daily as needed (severe fluid retention.). ) 15 tablet 3 unknown  . Multiple Vitamin (MULTIVITAMIN WITH MINERALS) TABS tablet Take 1 tablet by mouth daily.   Past Week at Unknown time  . polyethylene glycol (MIRALAX) 17 g packet Take 17 g by mouth daily as needed. (Patient taking differently: Take 17 g by mouth every three (3) days as needed (constipation.). ) 14 each 0 unknown  . sildenafil (REVATIO) 20 MG tablet Take 20 mg by mouth daily as needed (ed).    unknown  . spironolactone (ALDACTONE) 25 MG tablet Take 0.5 tablets (12.5 mg total) by mouth daily. 15 tablet 3 Past Week at Unknown time  . testosterone cypionate (DEPOTESTOSTERONE CYPIONATE) 200 MG/ML injection Inject 60 mg into the muscle every Thursday. 0.3 ml every week   Past Week at Unknown time  . warfarin (COUMADIN) 5 MG tablet Take 0.5-1 tablets (2.5-5 mg total) by mouth See admin instructions. Take 0.5 tablet (2.5 mg) by mouth on Thursdays & Saturdays, take 1 tablet (5 mg) by mouth on Sundays, Mondays, Tuesdays, Wednesdays, & Fridays. 90 tablet 0 11/24/2019  at unknown time   Scheduled:  . peg 3350 powder  0.5 kit Oral Once   Infusions:  . sodium chloride 50 mL/hr at 11/27/19 1704  . heparin 800 Units/hr (11/27/19 1954)  . lactated ringers      Assessment: 19 yoM with PMH Afib and mech MV on warfarin, dCHF, CKD4, cirrhosis and hepC, presents with rectal bleeding after colonoscopy with polypectomy on 8/16. Patient had bridged warfarin with Lovenox periprocedurally, and noticed several episodes of hematochezia after resuming warfarin postop. On 8/21, patient stopped taking both warfarin and Lovenox d/t recurrent rectal bleeding, and was admitted on 8/23 for same. During the current admission, he underwent a repeat colonoscopy with attempted repair of the ulcerated polypectomy site. Patient transfused multiple units of blood this admission. Given need for stroke prophylaxis in setting of active GI bleed, Pharmacy consulted to start IV heparin w/o bolus 6 hrs after colonoscopy.   Baseline INR subtherapeutic after holding warfarin, aPTT WNL  Prior anticoagulation: warfarin 5 mg daily except 2.5 mg on Sunday; Lovenox 1 mg/kg daily (with CrCl < 30); last dose of both on 8/21  Significant events:  Today, 11/28/2019:  CBC: Hgb low but improved after multiple units of PRBC; Plt low but improved to baseline; chronically low d/t cirrhosis  INR 2.6  HL 0.26   AKI on CKD (baseline ~2.0); SCr trending up  No bleeding or infusion issues per nursing  Goal of Therapy: Heparin level 0.3-0.5 units/ml until active bleeding resolved Monitor platelets by anticoagulation protocol: Yes  Plan:  Increase Heparin to 900 units/hr IV infusion, no bolus  Check heparin level 8 hrs after change   Daily CBC and INR, daily heparin level once stable  Monitor for signs of bleeding or thrombosis    Royetta Asal, PharmD, BCPS 11/28/2019 6:05 AM

## 2019-11-28 NOTE — Progress Notes (Signed)
PROGRESS NOTE    [name]  BMW:[id number] DOB: 08-Jan-1951 DOA: 11/26/2019 PCP: [name], DO  Brief Narrative:  32 rheumatic heart disease s/p valve replacement last 1 mechanical 2012 Hep C Chronic A. fib /FlutterAscending aortic aneurysm 4.5 cm CKD 3 Chronic anemia Severe pulmonary hypertension s/p cath 10/23/2019 mixed PAH + moderately depressed cardiac output followed by Dr. Tamala Julian, Dr. Haroldine Laws  Underwent EGD 8/16 with colonoscopy-found to have gastric polyps including sigmoid colon polyp 6 mm Developed self-limiting bleed subsequent to this but 5 days later bleeding restarted despite holding Lovenox-found to have bright red blood per rectum In ED patient was hypotensive-hemoglobin 10.2-->8.4 INR is 2.1 creatinine stable at 2.7 potassium 3.2  Patient has been transfused 3 units since this admission  Status post colonoscopy per GI 11/27/2019 blood in sigmoid colon clips placed at polypectomy sites  Heparin restarted as per GI and eventually transitioned to Lovenox and Coumadin bridge   Assessment & Plan:   Active Problems:   Acute blood loss anemia   1. Acute blood loss anemia secondary to hematochezia (post polypectomy bleed)-initial hemoglobin in the 5 range a. Hemoglobin has stabilized-transfusion threshold as below 8 given cardiac history b. Repeat labs a.m. c. No reported bleed so far after a large breakfast and lunch 8/25 therefore if continues to do well probably DC a.m. 11/29/2019 2. Paroxysmal A. fib rate controlled a. Chronic atrial fibrillation is well controlled at this time b. Continue Lovenox 60 mg for now and start bridging over with Coumadin if INR drops below 2.5 3. Rheumatic heart disease status post mitral valve replacement previously on Coumadin a. Goal INR is between 2.5 and 3.5 b. Will probably need low-dose Coumadin on discharge and outpatient anticoagulation follow-up c.  TOC to help ensure can get 4 to 5 days of bridging if there is  a need on discharge if INR is subtherapeutic 4. Hypokalemia a. Replace orally 5. AKI superimposed on CKD 4 a. BUN/creatinine still above his normal range of 2.0 b. Would hold Lasix for now may be resume Aldactone sparingly in the outpatient setting c. Will need labs in the outpatient setting weakness 6. Hepatitis C/cirrhosis a. Does need outpatient follow-up could consider referral to hepatology b. Defer to GI c. Imaging 6 monthly to yearly and all fiber sure testing 7. HFpEF, mixed pulmonary hypertension a. Has severe pulmonary hypertension b. Needs discussion with advanced heart failure team regarding PDE 5 inhibitor versus other options which may be limited 8. Thrombocytopenia a. Directly related to his cirrhosis  DVT prophylaxis: Lovenox Code Status: Full Family Communication: None Disposition:   Status is: Inpatient  Remains inpatient appropriate because:Hemodynamically unstable and Persistent severe electrolyte disturbances   Dispo: The patient is from: Home              Anticipated d/c is to: Home              Anticipated d/c date is: 1 day              Patient currently is not medically stable to d/c.       Consultants:   Scope  Procedures: Scope  Antimicrobials: None currently   Subjective: Tolerated large breakfast with a regular stool No bleeding No nausea no vomiting No chest pain  Objective: Vitals:   11/27/19 1315 11/27/19 1347 11/27/19 2005 11/28/19 0503  BP: (!) 99/57 95/75 (!) 94/55 94/60  Pulse: (!) 174 79 76 73  Resp: (!) '22 16 14 19  ' Temp:  97.7  F (36.5 C) 97.8 F (36.6 C) 98.6 F (37 C)  TempSrc:   Oral   SpO2: 100%  100% 98%  Weight:      Height:        Intake/Output Summary (Last 24 hours) at 11/28/2019 9747 Last data filed at 11/28/2019 1855 Gross per 24 hour  Intake 1896.61 ml  Output 500 ml  Net 1396.61 ml   Filed Weights   11/26/19 1631 11/27/19 0958  Weight: 60.3 kg 60.3 kg    Examination:  General exam: EOMI  NCAT [place] field African-American male in no distress after Respiratory system: Clear no added sound no rales no rhonchi no adventitious sounds F2 sinus rhythm Cardiovascular system: Sinus rhythm on monitors with PVCs Gastrointestinal system: Soft no rebound no guarding. Central nervous system: Intact no focal deficit Extremities: ROM of joints intact Skin: Fine reticular rash over chest and upper arms Psychiatry: Euthymic congruent  Data Reviewed: I have personally reviewed following labs and imaging studies Potassium 3.3 CO2 21 BUN/creatinine down from 43/3.3-38/2.6 Hemoglobin 8.2 platelets 75 white count  Radiology Studies: DG Chest Port 1 View  Result Date: 11/26/2019 CLINICAL DATA:  Patient status post colonoscopy 11/20/2019 now with heavy rectal bleeding and hypotension. Weakness and shortness of breath. EXAM: PORTABLE CHEST 1 VIEW COMPARISON:  PA and lateral chest 03/04/2018. FINDINGS: The lung bases are below the inferior margin of the film. Imaged lung parenchyma is emphysematous but clear. There is cardiomegaly. The patient is status post mitral valve repair. IMPRESSION: No acute disease. Lung bases are below the inferior margin of the film. Emphysema. Cardiomegaly. Electronically Signed   By: Inge Rise M.D.   On: 11/26/2019 13:46     Scheduled Meds: . peg 3350 powder  0.5 kit Oral Once   Continuous Infusions: . heparin 900 Units/hr (11/28/19 0627)  . lactated ringers       LOS: 2 days    Time spent: [place], MD [facility] To contact the attending provider between 7A-7P or the covering provider during after hours 7P-7A, please log into the web site [email or web address] and access using universal [place] password for that web site. If you do not have the password, please call the hospital operator.  11/28/2019, 8:12 AM

## 2019-11-28 NOTE — Progress Notes (Signed)
Occupational Therapy Evaluation  Patient independent with functional mobility, ambulation and self care. Reports has been "up and down all day," not requiring any physical assistance. Patient is at baseline, will sign off acute OT services.    11/28/19 1036  OT Visit Information  Last OT Received On 11/28/19  Assistance Needed +1  History of Present Illness Patient is a 69 year old male history of A. fib and mechanical heart valve on Coumadin, diastolic CHF, CKD-4, chronic venous insufficiency, HTN, BPH, cirrhosis and hepatitis C presenting with rectal bleeding, had colonoscopy 8/24  Precautions  Precautions None  Restrictions  Weight Bearing Restrictions No  Home Living  Family/patient expects to be discharged to: Private residence  Living Arrangements Alone  Available Help at Discharge Family (spouse lives in [place] home)  Type of [place] to enter  Entrance Stairs-Number of Steps 15  Entrance Stairs-Rails [place] One level  Bathroom Shower/Tub Tub/shower unit  [place] None  Prior Function  Level of Independence Independent  Communication  Communication No difficulties  Pain Assessment  Pain Assessment No/denies pain  Cognition  Arousal/Alertness Awake/alert  Behavior During Therapy WFL for tasks assessed/performed  Overall Cognitive Status Within Functional Limits for tasks assessed  Upper Extremity Assessment  Upper Extremity Assessment Overall WFL for tasks assessed  Lower Extremity Assessment  Lower Extremity Assessment Overall WFL for tasks assessed  Cervical / Trunk Assessment  Cervical / Trunk Assessment Normal  ADL  Overall ADL's  Independent  Bed Mobility  Overal bed mobility Independent  Transfers  Overall transfer level Independent  Equipment used None  Balance  Overall balance assessment No apparent balance deficits (not formally assessed)  General Comments  General comments  (skin integrity, edema, etc.) patient reports "I have been up and down all night and day"  OT - End of Session  Activity Tolerance Patient tolerated treatment well  Patient left in bed;with call bell/phone within reach  Nurse Communication  (no OT needs)  OT Assessment  OT Recommendation/Assessment Patient does not need any further OT services  OT Visit Diagnosis Other abnormalities of gait and mobility (R26.89)  OT Problem List Decreased activity tolerance  AM-PAC OT "6 Clicks" Daily Activity Outcome Measure (Version 2)  Help from another person eating meals? 4  Help from another person taking care of personal grooming? 4  Help from another person toileting, which includes using toliet, bedpan, or urinal? 4  Help from another person bathing (including washing, rinsing, drying)? 4  Help from another person to put on and taking off regular upper body clothing? 4  Help from another person to put on and taking off regular lower body clothing? 4  6 Click Score 24  OT Recommendation  Follow Up Recommendations No OT follow up  OT Equipment None recommended by OT  OT Time Calculation  OT Start Time (ACUTE ONLY) 0957  OT Stop Time (ACUTE ONLY) 1007  OT Time Calculation (min) 10 min  OT General Charges  $OT Visit 1 Visit  OT Evaluation  $OT Eval Low Complexity 1 Low  Written Expression  Dominant Hand Left   Delbert Phenix OT OT pager: [phone]

## 2019-11-28 NOTE — Progress Notes (Signed)
ANTICOAGULATION CONSULT NOTE - Consult  Pharmacy Consult for Lovenox Indication: Mechanical mitral valve; GI bleeding  Allergies  Allergen Reactions  . Penicillins Rash    Did it involve swelling of the face/tongue/throat, SOB, or low BP? No Did it involve sudden or severe rash/hives, skin peeling, or any reaction on the inside of your mouth or nose? No Did you need to seek medical attention at a hospital or doctor's office? No When did it last happen?30 + years If all above answers are "NO", may proceed with cephalosporin use.    Patient Measurements: Height: '6\' 1"'  (185.4 cm) Weight: 60.3 kg (133 lb) IBW/kg (Calculated) : 79.9 Heparin Dosing Weight: TBW  Vital Signs: Temp: 98.6 F (37 C) (08/25 0503) BP: 94/60 (08/25 0503) Pulse Rate: 73 (08/25 0503)  Labs: Recent Labs    11/26/19 1318 11/26/19 1318 11/26/19 1327 11/26/19 1943 11/27/19 0243 11/27/19 0243 11/27/19 1436 11/28/19 0446  HGB 8.4*   < > 9.2*   < > 7.0*   < > 9.8* 8.2*  HCT 26.7*   < > 27.0*   < > 22.1*  --  30.1* 25.2*  PLT 139*  --   --    < > 101*  --  105* 75*  APTT  --   --   --   --   --   --  35  --   LABPROT 22.4*  --   --   --   --   --  23.7* 27.3*  INR 2.1*  --   --   --   --   --  2.2* 2.6*  HEPARINUNFRC  --   --   --   --   --   --   --  0.26*  CREATININE 2.69*   < > 2.70*  --   --   --  3.30* 2.67*   < > = values in this interval not displayed.   Estimated Creatinine Clearance: 22.9 mL/min (A) (by C-G formula based on SCr of 2.67 mg/dL (H)).  Medical History: Past Medical History:  Diagnosis Date  . Allergy   . Aneurysm of ascending aorta (HCC)   . Atrial fibrillation ([place])   . BPH (benign prostatic hyperplasia)   . Colon polyps   . FHx: rheumatic heart disease   . H/O diplopia   . Hepatitis C   . Hypertension   . Hypogonadism male   . Mitral valve disease    mitral valve repair/replacement x3   Medications:  Medications Prior to Admission  Medication Sig Dispense  Refill Last Dose  . amoxicillin (AMOXIL) 500 MG capsule Take 4 capsules by mouth 1 hour prior to dental appointment (Patient taking differently: Take 2,000 mg by mouth See admin instructions. Take 4 capsules (2000 mg) by mouth 1 hour prior to dental appointment) 4 capsule 0 unknown  . enoxaparin (LOVENOX) 60 MG/0.6ML injection Inject 0.6 mLs (60 mg total) into the skin daily. 6 mL 1 11/24/2019 at unknown time  . ferrous sulfate 325 (65 FE) MG tablet Take 325 mg by mouth every other day. In the evening.   Past Week at Unknown time  . furosemide (LASIX) 40 MG tablet Take 1 tablet (40 mg total) by mouth daily. (Patient taking differently: Take 40 mg by mouth every evening. ) 30 tablet 4 Past Week at Unknown time  . metolazone (ZAROXOLYN) 2.5 MG tablet Take 1 tablet (2.5 mg total) by mouth daily as needed. (Patient taking differently: Take 2.5 mg by  mouth daily as needed (severe fluid retention.). ) 15 tablet 3 unknown  . Multiple Vitamin (MULTIVITAMIN WITH MINERALS) TABS tablet Take 1 tablet by mouth daily.   Past Week at Unknown time  . polyethylene glycol (MIRALAX) 17 g packet Take 17 g by mouth daily as needed. (Patient taking differently: Take 17 g by mouth every three (3) days as needed (constipation.). ) 14 each 0 unknown  . sildenafil (REVATIO) 20 MG tablet Take 20 mg by mouth daily as needed (ed).    unknown  . spironolactone (ALDACTONE) 25 MG tablet Take 0.5 tablets (12.5 mg total) by mouth daily. 15 tablet 3 Past Week at Unknown time  . testosterone cypionate (DEPOTESTOSTERONE CYPIONATE) 200 MG/ML injection Inject 60 mg into the muscle every Thursday. 0.3 ml every week   Past Week at Unknown time  . warfarin (COUMADIN) 5 MG tablet Take 0.5-1 tablets (2.5-5 mg total) by mouth See admin instructions. Take 0.5 tablet (2.5 mg) by mouth on Thursdays & Saturdays, take 1 tablet (5 mg) by mouth on Sundays, Mondays, Tuesdays, Wednesdays, & Fridays. 90 tablet 0 11/24/2019 at unknown time   Scheduled:  .  enoxaparin (LOVENOX) injection  60 mg Subcutaneous Daily  . peg 3350 powder  0.5 kit Oral Once  . potassium chloride  40 mEq Oral Daily   Infusions:  . sodium chloride    . lactated ringers      Assessment: 80 yoM with PMH Afib and mech MV on warfarin, dCHF, CKD4, cirrhosis and hepC, presents with rectal bleeding after colonoscopy with polypectomy on 8/16. Patient had bridged warfarin with Lovenox periprocedurally, and noticed several episodes of hematochezia after resuming warfarin postop. On 8/21, patient stopped taking both warfarin and Lovenox d/t recurrent rectal bleeding, and was admitted on 8/23 for same. During the current admission, he underwent a repeat colonoscopy with attempted repair of the ulcerated polypectomy site. Patient transfused multiple units of blood this admission. Given need for stroke prophylaxis in setting of active GI bleed, Pharmacy consulted to start IV heparin w/o bolus 6 hrs after colonoscopy.   Baseline INR subtherapeutic after holding warfarin, aPTT WNL  Prior anticoagulation: warfarin 5 mg daily except 2.5 mg on Sunday; Lovenox 1 mg/kg daily (with CrCl < 30); last dose of both on 8/21  Significant events: 8/24 colonoscopy 8/25: no further bleed per GI, back to Lovenox  Today, 11/28/2019:  CBC: Hgb low but improved after multiple units of PRBC; Plt low 75; chronically low d/t cirrhosis  INR 2.6  AKI on CKD (baseline ~2.0); SCr trending up  No bleeding or infusion issues per nursing  Transition back to Lovenox bridge per GI  Goal of Therapy: Heparin level 0.3-0.5 units/ml until active bleeding resolved Monitor platelets by anticoagulation protocol: Yes  Plan:  Discontinue Heparin at 1000  Lovenox 36m SQ q24, start one hour after Heparin d/c  Monitor for signs of bleeding or thrombosis  Plan resume Warfarin at discharge  GMinda DittoPharmD Pager [phone]/25/2021, 10:02 AM

## 2019-11-29 LAB — CBC WITH DIFFERENTIAL/PLATELET
Abs Immature Granulocytes: 0.01 10*3/uL (ref 0.00–0.07)
Basophils Absolute: 0 10*3/uL (ref 0.0–0.1)
Basophils Relative: 1 %
Eosinophils Absolute: 0.3 10*3/uL (ref 0.0–0.5)
Eosinophils Relative: 6 %
HCT: 27.8 % — ABNORMAL LOW (ref 39.0–52.0)
Hemoglobin: 8.6 g/dL — ABNORMAL LOW (ref 13.0–17.0)
Immature Granulocytes: 0 %
Lymphocytes Relative: 13 %
Lymphs Abs: 0.6 10*3/uL — ABNORMAL LOW (ref 0.7–4.0)
MCH: 31.9 pg (ref 26.0–34.0)
MCHC: 30.9 g/dL (ref 30.0–36.0)
MCV: 103 fL — ABNORMAL HIGH (ref 80.0–100.0)
Monocytes Absolute: 0.4 10*3/uL (ref 0.1–1.0)
Monocytes Relative: 7 %
Neutro Abs: 3.8 10*3/uL (ref 1.7–7.7)
Neutrophils Relative %: 73 %
Platelets: 73 10*3/uL — ABNORMAL LOW (ref 150–400)
RBC: 2.7 MIL/uL — ABNORMAL LOW (ref 4.22–5.81)
RDW: 23.4 % — ABNORMAL HIGH (ref 11.5–15.5)
WBC: 5.1 10*3/uL (ref 4.0–10.5)
nRBC: 0 % (ref 0.0–0.2)

## 2019-11-29 LAB — BASIC METABOLIC PANEL
Anion gap: 5 (ref 5–15)
BUN: 32 mg/dL — ABNORMAL HIGH (ref 8–23)
CO2: 23 mmol/L (ref 22–32)
Calcium: 7.7 mg/dL — ABNORMAL LOW (ref 8.9–10.3)
Chloride: 108 mmol/L (ref 98–111)
Creatinine, Ser: 2.21 mg/dL — ABNORMAL HIGH (ref 0.61–1.24)
GFR calc Af Amer: 34 mL/min — ABNORMAL LOW (ref >60)
GFR calc non Af Amer: 30 mL/min — ABNORMAL LOW (ref >60)
Glucose, Bld: 124 mg/dL — ABNORMAL HIGH (ref 70–99)
Potassium: 3.2 mmol/L — ABNORMAL LOW (ref 3.5–5.1)
Sodium: 136 mmol/L (ref 135–145)

## 2019-11-29 LAB — PROTIME-INR
INR: 1.6 — ABNORMAL HIGH (ref 0.8–1.2)
Prothrombin Time: 18.8 seconds — ABNORMAL HIGH (ref 11.4–15.2)

## 2019-11-29 MED ORDER — WARFARIN - PHARMACIST DOSING INPATIENT: Freq: Every day | Status: DC

## 2019-11-29 MED ORDER — FERROUS SULFATE 325 (65 FE) MG PO TABS: 325.0000 mg | ORAL_TABLET | ORAL | 3 refills | Status: AC

## 2019-11-29 MED ORDER — WARFARIN SODIUM 5 MG PO TABS: 5.0000 mg | ORAL_TABLET | Freq: Once | ORAL | Status: DC

## 2019-11-29 NOTE — Care Management Important Message (Signed)
Important Message  Patient Details IM Letter given to the Patient Name: [name] MRN: [id number] Date of Birth: 05-19-1950   Medicare Important Message Given:  Yes     Kerin Salen 11/29/2019, 12:27 PM

## 2019-11-29 NOTE — Progress Notes (Signed)
[facility] Progress Note  Chief Complaint: Hematochezia  History:  Mr. Coutant has been well since I saw him yesterday.  He had at least 2 bowel movements since then with no further bleeding.  He denies abdominal pain, chest pain dyspnea or dysuria He has had regular food all day yesterday and this morning, tolerated well without nausea vomiting or abdominal pain.  Objective:   Current Facility-Administered Medications:  .  (feeding supplement) PROSource Plus liquid 30 mL, 30 mL, Oral, Daily, Samtani, Jai-Gurmukh, MD .  acetaminophen (TYLENOL) tablet 650 mg, 650 mg, Oral, Q6H PRN, 650 mg at 11/29/19 0443 **OR** acetaminophen (TYLENOL) suppository 650 mg, 650 mg, Rectal, Q6H PRN, Gonfa, Taye T, MD .  enoxaparin (LOVENOX) injection 60 mg, 60 mg, Subcutaneous, Daily, Nyoka Cowden, Terri L, RPH, 60 mg at 11/28/19 1509 .  feeding supplement (KATE FARMS STANDARD 1.4) liquid 325 mL, 325 mL, Oral, Daily, Samtani, Jai-Gurmukh, MD, 325 mL at 11/28/19 2158 .  lactated ringers infusion, , Intravenous, Continuous, Danis, Estill Cotta III, MD .  multivitamin with minerals tablet 1 tablet, 1 tablet, Oral, Daily, Nita Sells, MD, 1 tablet at 11/28/19 1513 .  ondansetron (ZOFRAN) tablet 4 mg, 4 mg, Oral, Q6H PRN **OR** ondansetron (ZOFRAN) injection 4 mg, 4 mg, Intravenous, Q6H PRN, Gonfa, Taye T, MD .  oxyCODONE (Oxy IR/ROXICODONE) immediate release tablet 5 mg, 5 mg, Oral, Q4H PRN, Cyndia Skeeters, Taye T, MD .  [COMPLETED] peg 3350 powder (MOVIPREP) kit 100 g, 0.5 kit, Oral, Once, 100 g at 11/26/19 2252 **AND** peg 3350 powder (MOVIPREP) kit 100 g, 0.5 kit, Oral, Once, Gonfa, Taye T, MD .  potassium chloride SA (KLOR-CON) CR tablet 40 mEq, 40 mEq, Oral, Daily, Verlon Au, Jai-Gurmukh, MD, 40 mEq at 11/28/19 0931  . lactated ringers       Vital signs in last 24 hrs: Vitals:   11/28/19 2158 11/29/19 0434  BP: 92/64 105/69  Pulse: 82 80  Resp: 18 18  Temp: 97.9 F (36.6 C) 98.2 F (36.8 C)  SpO2: 97% 97%     Intake/Output Summary (Last 24 hours) at 11/29/2019 9924 Last data filed at 11/29/2019 0451 Gross per 24 hour  Intake 1031.67 ml  Output 600 ml  Net 431.67 ml     Physical Exam   HEENT: sclera anicteric, oral mucosa without lesions  Cardiac: Metallic valve click, elevated JVP as before, no peripheral edema  Pulm: clear to auscultation bilaterally, normal RR and effort noted  Abdomen: soft, no tenderness, with active bowel sounds. No guarding or palpable hepatosplenomegaly  Skin; warm and dry, no jaundice  Recent Labs:  CBC Latest Ref Rng & Units 11/28/2019 11/27/2019 11/27/2019  WBC 4.0 - 10.5 K/uL 7.3 8.5 8.4  Hemoglobin 13.0 - 17.0 g/dL 8.2(L) 9.8(L) 7.0(L)  Hematocrit 39 - 52 % 25.2(L) 30.1(L) 22.1(L)  Platelets 150 - 400 K/uL 75(L) 105(L) 101(L)   This morning CBC is not yet resulted  Recent Labs  Lab 11/29/19 0431  INR 1.6*   CMP Latest Ref Rng & Units 11/29/2019 11/28/2019 11/27/2019  Glucose 70 - 99 mg/dL 124(H) 100(H) 113(H)  BUN 8 - 23 mg/dL 32(H) 38(H) 43(H)  Creatinine 0.61 - 1.24 mg/dL 2.21(H) 2.67(H) 3.30(H)  Sodium 135 - 145 mmol/L 136 136 141  Potassium 3.5 - 5.1 mmol/L 3.2(L) 3.3(L) 3.9  Chloride 98 - 111 mmol/L 108 104 103  CO2 22 - 32 mmol/L 23 21(L) 22  Calcium 8.9 - 10.3 mg/dL 7.7(L) 7.7(L) 8.1(L)  Total Protein 6.5 - 8.1 g/dL - -  6.6  Total Bilirubin 0.3 - 1.2 mg/dL - - 2.0(H)  Alkaline Phos 38 - 126 U/L - - 35(L)  AST 15 - 41 U/L - - 109(H)  ALT 0 - 44 U/L - - 35     Assessment & Plan  Assessment: Hematochezia Acute blood loss anemia Chronic anticoagulation for mechanical mitral valve.  INR subtherapeutic today since he has been off Coumadin several days.   His bleeding was controlled endoscopically and I think is at low risk of recurring.  Plan:  Assuming hemoglobin stable today, I think he can be discharged home with bridging Lovenox and plan to resume Coumadin this evening with Coumadin clinic close follow-up. It would be ideal  for him to also have a CBC with his next INR check. I will message the patient's primary GI physician Dr. Havery Moros to check up on the follow-up CBC and arrange any necessary follow-up.  Total time 25 minutes.  Case discussed with [facility] physician.  Nelida Meuse III Office: [phone]

## 2019-11-29 NOTE — Discharge Summary (Signed)
Physician Discharge Summary  RHETT MUTSCHLER TKZ:[id number] DOB: December 20, 1950 DOA: 11/26/2019  PCP: [name], DO  Admit date: 11/26/2019 Discharge date: 11/29/2019  Time spent: 35 minutes  Recommendations for Outpatient Follow-up:  1. Needs hemoglobin checked in addition to Chem-12 in about 1 week 2. Needs an INR in about 3 to 4 days to adjust his Coumadin dose 3. Resume iron probably later this week to prevent obfuscation of dark stools 4. Needs outpatient follow-up with cardiologist 5. GI already aware of him being discharged and are setting up follow-up 6. Consider outpatient cirrhosis screening/ultrasound as per GI  Discharge Diagnoses:  Active Problems:   Acute blood loss anemia   Discharge Condition: Improved  Diet recommendation: Soft diet for now resume regular diet by Sunday  Filed Weights   11/26/19 1631 11/27/19 0958  Weight: 60.3 kg 60.3 kg    History of present illness:  69 rheumatic heart disease s/p valve replacement last 1 mechanical 2012 Hep C Chronic A. fib /FlutterAscending aortic aneurysm 4.5 cm CKD 3 Chronic anemia Severe pulmonary hypertension s/p cath 10/23/2019 mixed PAH + moderately depressed cardiac output followed by Dr. Tamala Julian, Dr. Haroldine Laws  Underwent EGD 8/16 with colonoscopy-found to have gastric polyps including sigmoid colon polyp 6 mm Developed self-limiting bleed subsequent to this but 5 days later bleeding restarted despite holding Lovenox-found to have bright red blood per rectum In ED patient was hypotensive-hemoglobin 10.2-->8.4 INR is 2.1 creatinine stable at 2.7 potassium 3.2  Patient has been transfused 3 units since this admission  Status post colonoscopy per GI 11/27/2019 blood in sigmoid colon clips placed at polypectomy sites  Heparin restarted as per GI and eventually transitioned to Lovenox and Coumadin bridge  Hospital Course:  1. Acute blood loss anemia secondary to hematochezia (post polypectomy  bleed)-initial hemoglobin in the 5 range a. Hemoglobin has stabilized-transfusion threshold as below 8 given cardiac history b. Hemoglobin is pending for this morning but if stable and having nonbloody stools think it is safe to discharge him 2. Paroxysmal A. fib rate controlled a. Chronic atrial fibrillation is well controlled at this time without rate controlling agent b. Continue Lovenox 60 mg for now and start bridging over with Coumadin if INR drops below 2.5 3. Rheumatic heart disease status post mitral valve replacement previously on Coumadin a. Goal INR is between 2.5 and 3.5 b. Lovenox resumed without any deleterious effect therefore bridging to Coumadin as an outpatient c. Requires INR check in 3 to 4 days 4. Hypokalemia a. Replace orally 5. AKI superimposed on CKD 4 a. BUN/creatinine still above his normal range of 2.0 b. Lasix and Zaroxolyn discontinued this admission but may need to be resumed in the outpatient setting c. Resume Aldactone 12.5 on discharge 6. Hepatitis C/cirrhosis a. Does need outpatient follow-up could consider referral to hepatology b. Defer to GI c. Imaging 6 monthly to yearly and all fiber sure testing 7. HFpEF, mixed pulmonary hypertension a. Has severe pulmonary hypertension b. Resumed Revatio in the outpatient setting c. CC cardiology 8. Thrombocytopenia a. Directly related to his cirrhosis   Procedures: Multiple Consultations:  GI  Discharge Exam: Vitals:   11/28/19 2158 11/29/19 0434  BP: 92/64 105/69  Pulse: 82 80  Resp: 18 18  Temp: 97.9 F (36.6 C) 98.2 F (36.8 C)  SpO2: 97% 97%    General: Awake alert coherent no distress EOMI NCAT no focal deficit Cardiovascular: S1-S2 no murmur rub or gallop Respiratory: Clinically clear no added sound Abdomen soft nontender no rebound  no guarding No focal deficit No asterixis  Discharge Instructions   Discharge Instructions    Diet - low sodium heart healthy   Complete by: As  directed    Discharge instructions   Complete by: As directed    Make sure that you look at your list of medications carefully as some of them have changed including your diuretics You will probably need follow-up with cardiology in the outpatient setting but I would suggest that your primary care physician and you get labs in about a week and decide on resuming some of your fluid pills I would suggest that you take Lovenox as well as Coumadin together until you bridge over to a therapeutic INR It would be a good idea for you to hold your iron pills until this Sunday because sometimes when you take iron you can seem to have dark stools but they are not actually bloody Please continue a soft diet   Increase activity slowly   Complete by: As directed      Allergies as of 11/29/2019      Reactions   Penicillins Rash   Did it involve swelling of the face/tongue/throat, SOB, or low BP? No Did it involve sudden or severe rash/hives, skin peeling, or any reaction on the inside of your mouth or nose? No Did you need to seek medical attention at a hospital or doctor's office? No When did it last happen?30 + years If all above answers are "NO", may proceed with cephalosporin use.      Medication List    STOP taking these medications   amoxicillin 500 MG capsule Commonly known as: AMOXIL   furosemide 40 MG tablet Commonly known as: LASIX   metolazone 2.5 MG tablet Commonly known as: ZAROXOLYN   multivitamin with minerals Tabs tablet     TAKE these medications   enoxaparin 60 MG/0.6ML injection Commonly known as: LOVENOX Inject 0.6 mLs (60 mg total) into the skin daily.   ferrous sulfate 325 (65 FE) MG tablet Take 1 tablet (325 mg total) by mouth every other day. In the evening. Start taking on: December 02, 2019 What changed: These instructions start on December 02, 2019. If you are unsure what to do until then, ask your doctor or other care provider.   polyethylene glycol 17 g  packet Commonly known as: MiraLax Take 17 g by mouth daily as needed. What changed:   when to take this  reasons to take this   sildenafil 20 MG tablet Commonly known as: REVATIO Take 20 mg by mouth daily as needed (ed).   spironolactone 25 MG tablet Commonly known as: ALDACTONE Take 0.5 tablets (12.5 mg total) by mouth daily.   testosterone cypionate 200 MG/ML injection Commonly known as: DEPOTESTOSTERONE CYPIONATE Inject 60 mg into the muscle every Thursday. 0.3 ml every week   warfarin 5 MG tablet Commonly known as: COUMADIN Take as directed. If you are unsure how to take this medication, talk to your nurse or doctor. Original instructions: Take 0.5-1 tablets (2.5-5 mg total) by mouth See admin instructions. Take 0.5 tablet (2.5 mg) by mouth on Thursdays & Saturdays, take 1 tablet (5 mg) by mouth on Sundays, Mondays, Tuesdays, Wednesdays, & Fridays.      Allergies  Allergen Reactions  . Penicillins Rash    Did it involve swelling of the face/tongue/throat, SOB, or low BP? No Did it involve sudden or severe rash/hives, skin peeling, or any reaction on the inside of your mouth or nose? No  Did you need to seek medical attention at a hospital or doctor's office? No When did it last happen?30 + years If all above answers are "NO", may proceed with cephalosporin use.       The results of significant diagnostics from this hospitalization (including imaging, microbiology, ancillary and laboratory) are listed below for reference.    Significant Diagnostic Studies: DG Chest Port 1 View  Result Date: 11/26/2019 CLINICAL DATA:  Patient status post colonoscopy 11/20/2019 now with heavy rectal bleeding and hypotension. Weakness and shortness of breath. EXAM: PORTABLE CHEST 1 VIEW COMPARISON:  PA and lateral chest 03/04/2018. FINDINGS: The lung bases are below the inferior margin of the film. Imaged lung parenchyma is emphysematous but clear. There is cardiomegaly. The patient  is status post mitral valve repair. IMPRESSION: No acute disease. Lung bases are below the inferior margin of the film. Emphysema. Cardiomegaly. Electronically Signed   By: Inge Rise M.D.   On: 11/26/2019 13:46    Microbiology: Recent Results (from the past 240 hour(s))  SARS Coronavirus 2 by RT PCR (hospital order, performed in [facility] hospital lab) Nasopharyngeal Nasopharyngeal Swab     Status: None   Collection Time: 11/26/19  1:53 PM   Specimen: Nasopharyngeal Swab  Result Value Ref Range Status   SARS Coronavirus 2 NEGATIVE NEGATIVE Final    Comment: (NOTE) SARS-CoV-2 target nucleic acids are NOT DETECTED.  The SARS-CoV-2 RNA is generally detectable in upper and lower respiratory specimens during the acute phase of infection. The lowest concentration of SARS-CoV-2 viral copies this assay can detect is 250 copies / mL. A negative result does not preclude SARS-CoV-2 infection and should not be used as the sole basis for treatment or other patient management decisions.  A negative result may occur with improper specimen collection / handling, submission of specimen other than nasopharyngeal swab, presence of viral mutation(s) within the areas targeted by this assay, and inadequate number of viral copies (<250 copies / mL). A negative result must be combined with clinical observations, patient history, and epidemiological information.  Fact Sheet for Patients:   StrictlyIdeas.no  Fact Sheet for Healthcare Providers: BankingDealers.co.za  This test is not yet approved or  cleared by the [place] FDA and has been authorized for detection and/or diagnosis of SARS-CoV-2 by FDA under an Emergency Use Authorization (EUA).  This EUA will remain in effect (meaning this test can be used) for the duration of the COVID-19 declaration under Section 564(b)(1) of the Act, 21 U.S.C. section 360bbb-3(b)(1), unless the authorization is  terminated or revoked sooner.  Performed at [facility], [place] [address], [place], [place] [address]      Labs: Basic Metabolic Panel: Recent Labs  Lab 11/26/19 1318 11/26/19 1327 11/27/19 1436 11/28/19 0446 11/29/19 0431  NA 138 140 141 136 136  K 3.2* 3.2* 3.9 3.3* 3.2*  CL 101 99 103 104 108  CO2 25  --  22 21* 23  GLUCOSE 106* 102* 113* 100* 124*  BUN 36* 36* 43* 38* 32*  CREATININE 2.69* 2.70* 3.30* 2.67* 2.21*  CALCIUM 9.1  --  8.1* 7.7* 7.7*   Liver Function Tests: Recent Labs  Lab 11/26/19 1318 11/27/19 1436  AST 112* 109*  ALT 36 35  ALKPHOS 45 35*  BILITOT 2.8* 2.0*  PROT 7.8 6.6  ALBUMIN 3.9 3.3*   No results for input(s): LIPASE, AMYLASE in the last 168 hours. No results for input(s): AMMONIA in the last 168 hours. CBC: Recent Labs  Lab 11/26/19 1318 11/26/19 1318 11/26/19 1327 11/26/19 1943 11/27/19 0243 11/27/19 1436 11/28/19 0446  WBC 5.9  --   --  6.3 8.4 8.5 7.3  NEUTROABS 4.0  --   --   --   --   --  5.2  HGB 8.4*   < > 9.2* 7.5* 7.0* 9.8* 8.2*  HCT 26.7*   < > 27.0* 23.8* 22.1* 30.1* 25.2*  MCV 106.8*  --   --  103.0* 103.8* 97.7 97.7  PLT 139*  --   --  90* 101* 105* 75*   < > = values in this interval not displayed.   Cardiac Enzymes: No results for input(s): CKTOTAL, CKMB, CKMBINDEX, TROPONINI in the last 168 hours. BNP: BNP (last 3 results) Recent Labs    12/22/18 1135 02/22/19 1025  BNP 376.5* 633.8*    ProBNP (last 3 results) No results for input(s): PROBNP in the last 8760 hours.  CBG: No results for input(s): GLUCAP in the last 168 hours.     Signed:  Nita Sells MD   [facility] 11/29/2019, 9:39 AM

## 2019-11-29 NOTE — Progress Notes (Addendum)
ANTICOAGULATION CONSULT NOTE - Consult  Pharmacy Consult for Lovenox/warfarin Indication: Mechanical mitral valve  Allergies  Allergen Reactions  . Penicillins Rash    Did it involve swelling of the face/tongue/throat, SOB, or low BP? No Did it involve sudden or severe rash/hives, skin peeling, or any reaction on the inside of your mouth or nose? No Did you need to seek medical attention at a hospital or doctor's office? No When did it last happen?30 + years If all above answers are "NO", may proceed with cephalosporin use.    Patient Measurements: Height: '6\' 1"'  (185.4 cm) Weight: 60.3 kg (133 lb) IBW/kg (Calculated) : 79.9 Heparin Dosing Weight: TBW  Vital Signs: Temp: 98.2 F (36.8 C) (08/26 0434) Temp Source: Oral (08/26 0434) BP: 105/69 (08/26 0434) Pulse Rate: 80 (08/26 0434)  Labs: Recent Labs    11/26/19 1318 11/27/19 0243 11/27/19 1436 11/28/19 0446 11/29/19 0431  HGB   < > 7.0* 9.8* 8.2*  --   HCT  --  22.1* 30.1* 25.2*  --   PLT  --  101* 105* 75*  --   APTT  --   --  35  --   --   LABPROT   < >  --  23.7* 27.3* 18.8*  INR   < >  --  2.2* 2.6* 1.6*  HEPARINUNFRC  --   --   --  0.26*  --   CREATININE   < >  --  3.30* 2.67* 2.21*   < > = values in this interval not displayed.   Estimated Creatinine Clearance: 27.7 mL/min (A) (by C-G formula based on SCr of 2.21 mg/dL (H)).  Medical History: Past Medical History:  Diagnosis Date  . Allergy   . Aneurysm of ascending aorta (HCC)   . Atrial fibrillation ([place])   . BPH (benign prostatic hyperplasia)   . Colon polyps   . FHx: rheumatic heart disease   . H/O diplopia   . Hepatitis C   . Hypertension   . Hypogonadism male   . Mitral valve disease    mitral valve repair/replacement x3   Medications:  Medications Prior to Admission  Medication Sig Dispense Refill Last Dose  . amoxicillin (AMOXIL) 500 MG capsule Take 4 capsules by mouth 1 hour prior to dental appointment (Patient taking  differently: Take 2,000 mg by mouth See admin instructions. Take 4 capsules (2000 mg) by mouth 1 hour prior to dental appointment) 4 capsule 0 unknown  . enoxaparin (LOVENOX) 60 MG/0.6ML injection Inject 0.6 mLs (60 mg total) into the skin daily. 6 mL 1 11/24/2019 at unknown time  . ferrous sulfate 325 (65 FE) MG tablet Take 325 mg by mouth every other day. In the evening.   Past Week at Unknown time  . furosemide (LASIX) 40 MG tablet Take 1 tablet (40 mg total) by mouth daily. (Patient taking differently: Take 40 mg by mouth every evening. ) 30 tablet 4 Past Week at Unknown time  . metolazone (ZAROXOLYN) 2.5 MG tablet Take 1 tablet (2.5 mg total) by mouth daily as needed. (Patient taking differently: Take 2.5 mg by mouth daily as needed (severe fluid retention.). ) 15 tablet 3 unknown  . Multiple Vitamin (MULTIVITAMIN WITH MINERALS) TABS tablet Take 1 tablet by mouth daily.   Past Week at Unknown time  . polyethylene glycol (MIRALAX) 17 g packet Take 17 g by mouth daily as needed. (Patient taking differently: Take 17 g by mouth every three (3) days as needed (constipation.). )  14 each 0 unknown  . sildenafil (REVATIO) 20 MG tablet Take 20 mg by mouth daily as needed (ed).    unknown  . spironolactone (ALDACTONE) 25 MG tablet Take 0.5 tablets (12.5 mg total) by mouth daily. 15 tablet 3 Past Week at Unknown time  . testosterone cypionate (DEPOTESTOSTERONE CYPIONATE) 200 MG/ML injection Inject 60 mg into the muscle every Thursday. 0.3 ml every week   Past Week at Unknown time  . warfarin (COUMADIN) 5 MG tablet Take 0.5-1 tablets (2.5-5 mg total) by mouth See admin instructions. Take 0.5 tablet (2.5 mg) by mouth on Thursdays & Saturdays, take 1 tablet (5 mg) by mouth on Sundays, Mondays, Tuesdays, Wednesdays, & Fridays. 90 tablet 0 11/24/2019 at unknown time   Scheduled:  . (feeding supplement) PROSource Plus  30 mL Oral Daily  . enoxaparin (LOVENOX) injection  60 mg Subcutaneous Daily  . feeding  supplement (KATE FARMS STANDARD 1.4)  325 mL Oral Daily  . multivitamin with minerals  1 tablet Oral Daily  . peg 3350 powder  0.5 kit Oral Once  . potassium chloride  40 mEq Oral Daily   Infusions:  . lactated ringers      Assessment: 39 yoM with PMH Afib and mech MV on warfarin, dCHF, CKD4, cirrhosis and hepC, presents with rectal bleeding after colonoscopy with polypectomy on 8/16. Patient had bridged warfarin with Lovenox periprocedurally, and noticed several episodes of hematochezia after resuming warfarin postop. On 8/21, patient stopped taking both warfarin and Lovenox d/t recurrent rectal bleeding, and was admitted on 8/23 for same. During the current admission, he underwent a repeat colonoscopy with attempted repair of the ulcerated polypectomy site. Patient transfused multiple units of blood this admission. Given need for stroke prophylaxis in setting of active GI bleed, Pharmacy consulted to start IV heparin w/o bolus 6 hrs after colonoscopy.   Baseline INR subtherapeutic after holding warfarin, aPTT WNL  Prior anticoagulation: warfarin 5 mg daily except 2.5 mg on Sunday; Lovenox 1 mg/kg daily (with CrCl < 30); last dose of both on 8/21  Significant events: 8/24 colonoscopy 8/25: no further bleed per GI, back to Lovenox 8/26: per GI, bleeding has resolved, Hgb stable, can be discharged on Lovenox bridge with warfarin  Today, 11/29/2019:  CBC: Hgb low but improved after multiple units of PRBC; Plt low 75; chronically low d/t cirrhosis  INR 1.6 down from 2.6, to restart warfarin today per Md orders  AKI on CKD (baseline ~2.0); SCr trending down now  No bleeding per nursing and GI  Goal of Therapy: Heparin level 0.6-1.2 units/ml INR 2.5-3.5 Monitor platelets by anticoagulation protocol: Yes  Plan:  Continue Lovenox 56m SQ q24 until INR therapeutic (note that Lovenox is reduced to 133mkg SQ q24 due to CrCl < 30 ml/min)  Hard to predict INR with warfarin at this point  with INR being therapeutic yesterday with no warfarin doses since 8/21 per med rec - will attempt to restart home regimen as above with very close follow up of INR as outpatient - [id number]  Monitor for signs of bleeding or thrombosis   JusAdrian SaranharmD, BCPS 11/29/2019 9:35 AM

## 2019-11-30 ENCOUNTER — Telehealth: Payer: Self-pay | Admitting: *Deleted

## 2019-11-30 ENCOUNTER — Telehealth: Payer: Self-pay | Admitting: Interventional Cardiology

## 2019-11-30 DIAGNOSIS — K922 Gastrointestinal hemorrhage, unspecified: Secondary | ICD-10-CM

## 2019-11-30 LAB — BPAM RBC
Blood Product Expiration Date: 202109212359
Blood Product Expiration Date: 202109212359
Blood Product Expiration Date: 202109222359
Blood Product Expiration Date: 202109222359
ISSUE DATE / TIME: 202108231510
ISSUE DATE / TIME: 202108240834
ISSUE DATE / TIME: 202108241035
Unit Type and Rh: 5100
Unit Type and Rh: 5100
Unit Type and Rh: 5100
Unit Type and Rh: 5100

## 2019-11-30 LAB — TYPE AND SCREEN
ABO/RH(D): O POS
Antibody Screen: NEGATIVE
Unit division: 0
Unit division: 0
Unit division: 0
Unit division: 0

## 2019-11-30 NOTE — Telephone Encounter (Signed)
-----   Message from Yetta Flock, MD sent at 11/29/2019  6:40 PM EDT ----- [name] can you please order a CBC and ask the patient to go to the lab on Monday?   Thanks ----- Message ----- From: Doran Stabler, MD Sent: 11/29/2019   9:32 AM EDT To: Yetta Flock, MD  Going home today after post polypectomy bleed.  I have asked for a CBC to be checked with his next INR (resuming Coumadin and Lovenox for mechanical mitral valve).  Please check up on that and arrange any necessary follow-up.  - HD

## 2019-11-30 NOTE — Telephone Encounter (Signed)
Left message for patient to call back  

## 2019-11-30 NOTE — Telephone Encounter (Signed)
I have spoken to patient to advise to come for labs on Monday. He verbalizes understanding.

## 2019-11-30 NOTE — Telephone Encounter (Signed)
Spoke with patient and advised that his d/c instructions on warfarin and lovenox are correct. He is having his INR checked at GI Dr. On Monday and it will be sent to [place].  Patient concerned about holding his lasix and diuretics. He was not told when to resume. He notices a little bit of swelling. Concerned about getting fluid overloaded. Scr yesterday was 2.21 which is a little above baseline (2.04). He did have 3 units of blood transfused. I will send this to Dr. Haroldine Laws and his staff to address.

## 2019-11-30 NOTE — Telephone Encounter (Signed)
Patient is requesting to speak with Dr. Thompson Caul nurse regarding his medication instructions for bridging lovenox. He states he would like to clarify that hospital discharge instructions are correct. Please advise.

## 2019-12-02 NOTE — Telephone Encounter (Signed)
Would resume previous diuretic dosing\

## 2019-12-03 ENCOUNTER — Other Ambulatory Visit (INDEPENDENT_AMBULATORY_CARE_PROVIDER_SITE_OTHER): Payer: Medicare Other

## 2019-12-03 DIAGNOSIS — K922 Gastrointestinal hemorrhage, unspecified: Secondary | ICD-10-CM | POA: Diagnosis not present

## 2019-12-03 LAB — CBC WITH DIFFERENTIAL/PLATELET
Basophils Absolute: 0 10*3/uL (ref 0.0–0.1)
Basophils Relative: 1 % (ref 0.0–3.0)
Eosinophils Absolute: 0.2 10*3/uL (ref 0.0–0.7)
Eosinophils Relative: 4.8 % (ref 0.0–5.0)
HCT: 26.8 % — ABNORMAL LOW (ref 39.0–52.0)
Hemoglobin: 8.9 g/dL — ABNORMAL LOW (ref 13.0–17.0)
Lymphocytes Relative: 12.7 % (ref 12.0–46.0)
Lymphs Abs: 0.5 10*3/uL — ABNORMAL LOW (ref 0.7–4.0)
MCHC: 33.1 g/dL (ref 30.0–36.0)
MCV: 96.2 fl (ref 78.0–100.0)
Monocytes Absolute: 0.4 10*3/uL (ref 0.1–1.0)
Monocytes Relative: 10 % (ref 3.0–12.0)
Neutro Abs: 2.8 10*3/uL (ref 1.4–7.7)
Neutrophils Relative %: 71.5 % (ref 43.0–77.0)
Platelets: 108 10*3/uL — ABNORMAL LOW (ref 150.0–400.0)
RBC: 2.79 Mil/uL — ABNORMAL LOW (ref 4.22–5.81)
RDW: 19.6 % — ABNORMAL HIGH (ref 11.5–15.5)
WBC: 3.9 10*3/uL — ABNORMAL LOW (ref 4.0–10.5)

## 2019-12-03 LAB — PROTIME-INR
INR: 1.4 ratio — ABNORMAL HIGH (ref 0.8–1.0)
Prothrombin Time: 16.1 s — ABNORMAL HIGH (ref 9.6–13.1)

## 2019-12-03 NOTE — Telephone Encounter (Signed)
Called and spoke with patient. Advised him to resume his diuretics at previous dose.

## 2019-12-05 ENCOUNTER — Ambulatory Visit: Payer: Medicare Other | Admitting: *Deleted

## 2019-12-05 ENCOUNTER — Other Ambulatory Visit: Payer: Self-pay

## 2019-12-05 DIAGNOSIS — Z5181 Encounter for therapeutic drug level monitoring: Secondary | ICD-10-CM | POA: Diagnosis not present

## 2019-12-05 DIAGNOSIS — I059 Rheumatic mitral valve disease, unspecified: Secondary | ICD-10-CM

## 2019-12-05 DIAGNOSIS — I4892 Unspecified atrial flutter: Secondary | ICD-10-CM

## 2019-12-05 LAB — POCT INR: INR: 1.3 — AB (ref 2.0–3.0)

## 2019-12-05 MED ORDER — ENOXAPARIN SODIUM 60 MG/0.6ML ~~LOC~~ SOLN: 60.0000 mg | SUBCUTANEOUS | 0 refills | Status: DC

## 2019-12-05 NOTE — Patient Instructions (Addendum)
Description   Continue lovenox injections. Take 2 tablets of warfarin  today and 1.5 tablets tomorrow, then continue taking 1 tablet daily except for 1/2 tablet on Sundays. Continue drinking 2 Ensures each day.  Please call [phone] if you have any procedures or medication changes.

## 2019-12-06 ENCOUNTER — Ambulatory Visit (HOSPITAL_COMMUNITY)
Admission: RE | Admit: 2019-12-06 | Discharge: 2019-12-06 | Disposition: A | Discharge status: Home / Self Care | Payer: Medicare Other | Source: Ambulatory Visit | Attending: Internal Medicine | Admitting: Internal Medicine

## 2019-12-06 VITALS — BP 110/62 | HR 82 | Wt 133.1 lb

## 2019-12-06 DIAGNOSIS — Z952 Presence of prosthetic heart valve: Secondary | ICD-10-CM | POA: Diagnosis not present

## 2019-12-06 DIAGNOSIS — N400 Enlarged prostate without lower urinary tract symptoms: Secondary | ICD-10-CM | POA: Diagnosis not present

## 2019-12-06 DIAGNOSIS — I5032 Chronic diastolic (congestive) heart failure: Secondary | ICD-10-CM | POA: Diagnosis not present

## 2019-12-06 DIAGNOSIS — B1920 Unspecified viral hepatitis C without hepatic coma: Secondary | ICD-10-CM | POA: Insufficient documentation

## 2019-12-06 DIAGNOSIS — I130 Hypertensive heart and chronic kidney disease with heart failure and stage 1 through stage 4 chronic kidney disease, or unspecified chronic kidney disease: Secondary | ICD-10-CM | POA: Diagnosis not present

## 2019-12-06 DIAGNOSIS — I2781 Cor pulmonale (chronic): Secondary | ICD-10-CM

## 2019-12-06 DIAGNOSIS — I712 Thoracic aortic aneurysm, without rupture, unspecified: Secondary | ICD-10-CM

## 2019-12-06 DIAGNOSIS — Z87891 Personal history of nicotine dependence: Secondary | ICD-10-CM | POA: Insufficient documentation

## 2019-12-06 DIAGNOSIS — N183 Chronic kidney disease, stage 3 unspecified: Secondary | ICD-10-CM | POA: Diagnosis not present

## 2019-12-06 DIAGNOSIS — I4892 Unspecified atrial flutter: Secondary | ICD-10-CM | POA: Diagnosis not present

## 2019-12-06 DIAGNOSIS — Z7901 Long term (current) use of anticoagulants: Secondary | ICD-10-CM | POA: Diagnosis not present

## 2019-12-06 DIAGNOSIS — D649 Anemia, unspecified: Secondary | ICD-10-CM | POA: Diagnosis not present

## 2019-12-06 DIAGNOSIS — I272 Pulmonary hypertension, unspecified: Secondary | ICD-10-CM | POA: Insufficient documentation

## 2019-12-06 DIAGNOSIS — Z79899 Other long term (current) drug therapy: Secondary | ICD-10-CM | POA: Diagnosis not present

## 2019-12-06 DIAGNOSIS — I4891 Unspecified atrial fibrillation: Secondary | ICD-10-CM | POA: Diagnosis not present

## 2019-12-06 LAB — CBC
HCT: 29.5 % — ABNORMAL LOW (ref 39.0–52.0)
Hemoglobin: 8.8 g/dL — ABNORMAL LOW (ref 13.0–17.0)
MCH: 29.5 pg (ref 26.0–34.0)
MCHC: 29.8 g/dL — ABNORMAL LOW (ref 30.0–36.0)
MCV: 99 fL (ref 80.0–100.0)
Platelets: 124 10*3/uL — ABNORMAL LOW (ref 150–400)
RBC: 2.98 MIL/uL — ABNORMAL LOW (ref 4.22–5.81)
RDW: 19.2 % — ABNORMAL HIGH (ref 11.5–15.5)
WBC: 4.4 10*3/uL (ref 4.0–10.5)
nRBC: 0 % (ref 0.0–0.2)

## 2019-12-06 LAB — BASIC METABOLIC PANEL
Anion gap: 11 (ref 5–15)
BUN: 30 mg/dL — ABNORMAL HIGH (ref 8–23)
CO2: 26 mmol/L (ref 22–32)
Calcium: 9.1 mg/dL (ref 8.9–10.3)
Chloride: 96 mmol/L — ABNORMAL LOW (ref 98–111)
Creatinine, Ser: 2.33 mg/dL — ABNORMAL HIGH (ref 0.61–1.24)
GFR calc Af Amer: 32 mL/min — ABNORMAL LOW (ref >60)
GFR calc non Af Amer: 28 mL/min — ABNORMAL LOW (ref >60)
Glucose, Bld: 105 mg/dL — ABNORMAL HIGH (ref 70–99)
Potassium: 3.1 mmol/L — ABNORMAL LOW (ref 3.5–5.1)
Sodium: 133 mmol/L — ABNORMAL LOW (ref 135–145)

## 2019-12-06 LAB — BRAIN NATRIURETIC PEPTIDE: B Natriuretic Peptide: 466.3 pg/mL — ABNORMAL HIGH (ref 0.0–100.0)

## 2019-12-06 NOTE — Progress Notes (Signed)
ADVANCED HF CLINIC NOTE   Primary Cardiologist: Dr. Daneen Schick  HPI:  [name] is a 69 y.o. male who is referred by Dr. 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 Other problems include hepatitis C (s/p curative therapy), chronic atrial flutter, ascending aortic aneurysm 4.5 cm, chronic kidney disease stage III,chronic anemia.    CTA of chest 6/20 neg for PE but dilated main pulmonary artery at 4.5 cm. + emphysema. Ectatic ascending aorta 4.4 cm.   Echo 7/20 EF 60-65%, moderately increased LV wall thickness.  Mod LVH. RV with severely reducedsystolic function pressure moderately elevated at 54.9 mmHg mildly dilated aortic root 4.4 cm, mild AI s/p MVR with mean gradient of 5 , severe LAE, severe RV dysfunction mild TR estimated RVSP moderately elevated.    We saw him for the first time on 11/08/18 More SOB, more LE edema. NYHA IIIB symptoms. Underwent RHC to assess need for inotropes  RHC on 11/24/18  RA = 17 RV = 71/17 PA = 71/27 (45) PCW = 27 (v = 39) Fick cardiac output/index = 3.7/1.96 PVR = 5.0 WU Ao sat = 98% PA sat = 52%, 54% PaPI = 3.17  Admitted 8/21 for GIB after recent polypectomy. Underwent EGD 8/16 with colonoscopy-found to have gastric polyps including sigmoid colon polyp 6 mm. Developed self-limiting bleed subsequent to this but 5 days later bleeding restarted despite holding Lovenox-found to have bright red blood per rectum. In ED patient was hypotensive-hemoglobin 10.2-->8.4 INR is 2.1  Repeat colonoscopy 11/27/2019 blood in sigmoid colon clips placed at polypectomy sites. Got 3 units of blood.   Here for f/u. Still feels weak but getting stronger. Taking lasix 40mg . Taking metolazone 1-2x/week. Last dose 2 days ago which helped. Mild LE edema. Can do  ADLs and go to mailbox slowly. SOB with stairs or if goes to fast. No further bleeding.    Smoked < 1/2 ppd x many years. Quit 1-2 years ago  Past Medical History:  Diagnosis Date  . Allergy   . Aneurysm of ascending aorta (HCC)   . Atrial fibrillation ([place])   . BPH (benign prostatic hyperplasia)   . Colon polyps   . FHx: rheumatic heart disease   . H/O diplopia   . Hepatitis C   . Hypertension   . Hypogonadism male   . Mitral valve disease    mitral valve repair/replacement x3    Current Outpatient Medications  Medication Sig Dispense Refill  . enoxaparin (LOVENOX) 60 MG/0.6ML injection Inject 0.6 mLs (60 mg total) into the skin daily. 6 syringes 3.6 mL 0  . ferrous sulfate 325 (65 FE) MG tablet Take 1 tablet (325 mg total) by mouth every other day. In the evening.  3  . furosemide (LASIX) 40 MG tablet Take 40 mg by mouth daily.    . polyethylene glycol (MIRALAX) 17 g packet Take 17 g by mouth daily as needed. (Patient taking differently: Take 17 g by mouth every three (3) days as needed (constipation.). ) 14 each 0  . sildenafil (REVATIO) 20 MG tablet Take 20 mg by mouth daily as needed (ed).     . spironolactone (ALDACTONE) 25 MG tablet Take 0.5 tablets (12.5 mg total) by mouth daily. 15 tablet 3  . testosterone cypionate (DEPOTESTOSTERONE CYPIONATE) 200 MG/ML injection Inject  60 mg into the muscle every Thursday. 0.3 ml every week    . warfarin (COUMADIN) 5 MG tablet Take 0.5-1 tablets (2.5-5 mg total) by mouth See admin instructions. Take 0.5 tablet (2.5 mg) by mouth on Thursdays & Saturdays, take 1 tablet (5 mg) by mouth on Sundays, Mondays, Tuesdays, Wednesdays, & Fridays. 90 tablet 0   No current facility-administered medications for this encounter.    Allergies  Allergen Reactions  . Penicillins Rash    Did it involve swelling of the face/tongue/throat, SOB, or low BP? No Did it involve sudden or severe rash/hives, skin peeling, or any reaction on the inside of your  mouth or nose? No Did you need to seek medical attention at a hospital or doctor's office? No When did it last happen?30 + years If all above answers are "NO", may proceed with cephalosporin use.       Social History   Socioeconomic History  . Marital status: Divorced    Spouse name: Not on file  . Number of children: 2  . Years of education: Not on file  . Highest education level: Some college, no degree  Occupational History  . Not on file  Tobacco Use  . Smoking status: Former Smoker    Types: Cigars    Quit date: 09/03/2017    Years since quitting: 2.2  . Smokeless tobacco: Never Used  Vaping Use  . Vaping Use: Never used  Substance and Sexual Activity  . Alcohol use: No  . Drug use: No  . Sexual activity: Not on file  Other Topics Concern  . Not on file  Social History Narrative   Lives at home alone    Left handed   Caffeine: 1-2 cups daily   Social Determinants of Health   Financial Resource Strain:   . Difficulty of Paying Living Expenses: Not on file  Food Insecurity:   . Worried About Charity fundraiser in the Last Year: Not on file  . Ran Out of Food in the Last Year: Not on file  Transportation Needs:   . Lack of Transportation (Medical): Not on file  . Lack of Transportation (Non-Medical): Not on file  Physical Activity:   . Days of Exercise per Week: Not on file  . Minutes of Exercise per Session: Not on file  Stress:   . Feeling of Stress : Not on file  Social Connections:   . Frequency of Communication with Friends and Family: Not on file  . Frequency of Social Gatherings with Friends and Family: Not on file  . Attends Religious Services: Not on file  . Active Member of Clubs or Organizations: Not on file  . Attends Archivist Meetings: Not on file  . Marital Status: Not on file  Intimate Partner Violence:   . Fear of Current or Ex-Partner: Not on file  . Emotionally Abused: Not on file  . Physically Abused: Not on file  .  Sexually Abused: Not on file   Fhx: No FHX of premature CAD or SCD. Personally reviewed Vitals:   12/06/19 1455  BP: 110/62  Pulse: 82  SpO2: 98%  Weight: 60.4 kg (133 lb 2 oz)    PHYSICAL EXAM: General:  Thin male.Weak appearing  No resp difficulty HEENT: normal Neck: supple. JVP 7  Carotids 2+ bilat; no bruits. No lymphadenopathy or thryomegaly appreciated. Cor: PMI nondisplaced. Mild irregular mech s1 2/6 SEM LUSB Lungs: clear Abdomen: soft, nontender, nondistended. No hepatosplenomegaly. No bruits or masses. Good  bowel sounds. Extremities: no cyanosis, clubbing, rash, 1-2+ edema Neuro: alert & orientedx3, cranial nerves grossly intact. moves all 4 extremities w/o difficulty. Affect pleasant  ReDS 22%  ASSESSMENT & PLAN:  1.  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 RV severely dilated and HK. RVSP 55 - RHC 8/20 with significantly elevated biventricular pressures, moderate to severe mixed PAH and low output (CI 1.96) - Functional status worse after recent hospitalization. Now NYHA III - He has R > L HF. ReDS 22% suggesting normal left-sided pressures but LE edema suggested he has some R-sided volume - Encouraged compression hose. Can take metolazone as needed (carefully) - Advanced options very limited. 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 Not ideal candidate for palliative inotropes given infection risk with mechanical MV - M-spike negative. PYP 1/21 negative for amyloid   2. 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   - mild MS on last echo - back on coumadin after recent post-polypectomy bleeding. No evidence of ongoing bleeding - Check CBC - aware of SBE prophylaxis   3.  Chronic a flutter  - rate controlled on coumadin.   4.  Thoracic aneurysm followed by Dr. Cyndia Bent  - CT chest 6/20 asc ao aorta 4.4cm   - not  surgical candidate   Glori Bickers, MD  3:22 PM

## 2019-12-06 NOTE — Progress Notes (Signed)
ReDS Vest / Clip - 12/06/19 1500      ReDS Vest / Clip   Station Marker C    Ruler Value 26    ReDS Value Range Low volume    ReDS Actual Value 22    Anatomical Comments sitting

## 2019-12-06 NOTE — Patient Instructions (Signed)
Labs done today, your results will be available in MyChart, we will contact you for abnormal readings.  Please call our office at the start of the new year (2022) to schedule your follow up appointment with an echocardiogram  Your physician has requested that you have an echocardiogram. Echocardiography is a painless test that uses sound waves to create images of your heart. It provides your doctor with information about the size and shape of your heart and how well your heart's chambers and valves are working. This procedure takes approximately one hour. There are no restrictions for this procedure.   If you have any questions or concerns before your next appointment please send [place] a message through [place] or call our office at [phone].    TO LEAVE A MESSAGE FOR THE NURSE SELECT OPTION 2, PLEASE LEAVE A MESSAGE INCLUDING: . YOUR NAME . DATE OF BIRTH . CALL BACK NUMBER . REASON FOR CALL**this is important as we prioritize the call backs  [place] AS LONG AS YOU CALL BEFORE 4:00 PM  At the [facility], you and your health needs are our priority. As part of our continuing mission to provide you with exceptional heart care, we have created designated Provider Care Teams. These Care Teams include your primary Cardiologist (physician) and Advanced Practice Providers (APPs- Physician Assistants and Nurse Practitioners) who all work together to provide you with the care you need, when you need it.   You may see any of the following providers on your designated Care Team at your next follow up: Marland Kitchen Dr Glori Bickers . Dr Loralie Champagne . Darrick Grinder, NP . Lyda Jester, PA . Audry Riles, PharmD   Please be sure to bring in all your medications bottles to every appointment.

## 2019-12-11 ENCOUNTER — Telehealth (HOSPITAL_COMMUNITY): Payer: Self-pay | Admitting: Cardiology

## 2019-12-11 ENCOUNTER — Ambulatory Visit (INDEPENDENT_AMBULATORY_CARE_PROVIDER_SITE_OTHER): Payer: Medicare Other | Admitting: *Deleted

## 2019-12-11 ENCOUNTER — Other Ambulatory Visit: Payer: Self-pay

## 2019-12-11 DIAGNOSIS — Z5181 Encounter for therapeutic drug level monitoring: Secondary | ICD-10-CM

## 2019-12-11 DIAGNOSIS — I5032 Chronic diastolic (congestive) heart failure: Secondary | ICD-10-CM

## 2019-12-11 DIAGNOSIS — I059 Rheumatic mitral valve disease, unspecified: Secondary | ICD-10-CM

## 2019-12-11 DIAGNOSIS — I4892 Unspecified atrial flutter: Secondary | ICD-10-CM

## 2019-12-11 LAB — POCT INR: INR: 2 (ref 2.0–3.0)

## 2019-12-11 MED ORDER — POTASSIUM CHLORIDE CRYS ER 20 MEQ PO TBCR: 20.0000 meq | EXTENDED_RELEASE_TABLET | Freq: Every day | ORAL | 3 refills | Status: DC

## 2019-12-11 NOTE — Patient Instructions (Addendum)
Description   Today take 1.5 tablets then start taking 1 tablet daily. Continue drinking 2 Ensures each day. Please call [phone] if you have any procedures or medication changes.

## 2019-12-11 NOTE — Telephone Encounter (Signed)
Pt aware of lab results and voiced understanding  Repeat labs 9/26 @ 10

## 2019-12-19 ENCOUNTER — Ambulatory Visit: Payer: Medicare Other | Admitting: *Deleted

## 2019-12-19 ENCOUNTER — Other Ambulatory Visit: Payer: Self-pay

## 2019-12-19 DIAGNOSIS — I4892 Unspecified atrial flutter: Secondary | ICD-10-CM

## 2019-12-19 DIAGNOSIS — I059 Rheumatic mitral valve disease, unspecified: Secondary | ICD-10-CM

## 2019-12-19 DIAGNOSIS — Z5181 Encounter for therapeutic drug level monitoring: Secondary | ICD-10-CM | POA: Diagnosis not present

## 2019-12-19 LAB — POCT INR: INR: 1.9 — AB (ref 2.0–3.0)

## 2019-12-19 NOTE — Patient Instructions (Signed)
Description   Today and tomorrow take 1.5 tablets then start taking 1 tablet daily except 1.5 tablets on Mondays. Continue drinking 2 Ensures each day. Recheck INR in 1 week. Please call [phone] if you have any procedures or medication changes.

## 2019-12-20 ENCOUNTER — Ambulatory Visit (HOSPITAL_COMMUNITY)
Admission: RE | Admit: 2019-12-20 | Discharge: 2019-12-20 | Disposition: A | Discharge status: Home / Self Care | Payer: Medicare Other | Source: Ambulatory Visit | Attending: Internal Medicine | Admitting: Internal Medicine

## 2019-12-20 DIAGNOSIS — I5032 Chronic diastolic (congestive) heart failure: Secondary | ICD-10-CM | POA: Diagnosis present

## 2019-12-20 LAB — BASIC METABOLIC PANEL
Anion gap: 9 (ref 5–15)
BUN: 30 mg/dL — ABNORMAL HIGH (ref 8–23)
CO2: 22 mmol/L (ref 22–32)
Calcium: 9.1 mg/dL (ref 8.9–10.3)
Chloride: 107 mmol/L (ref 98–111)
Creatinine, Ser: 2.41 mg/dL — ABNORMAL HIGH (ref 0.61–1.24)
GFR calc Af Amer: 31 mL/min — ABNORMAL LOW (ref >60)
GFR calc non Af Amer: 27 mL/min — ABNORMAL LOW (ref >60)
Glucose, Bld: 95 mg/dL (ref 70–99)
Potassium: 3.8 mmol/L (ref 3.5–5.1)
Sodium: 138 mmol/L (ref 135–145)

## 2019-12-27 ENCOUNTER — Other Ambulatory Visit: Payer: Self-pay

## 2019-12-27 ENCOUNTER — Ambulatory Visit: Payer: Medicare Other

## 2019-12-27 DIAGNOSIS — I4892 Unspecified atrial flutter: Secondary | ICD-10-CM | POA: Diagnosis not present

## 2019-12-27 DIAGNOSIS — I059 Rheumatic mitral valve disease, unspecified: Secondary | ICD-10-CM

## 2019-12-27 DIAGNOSIS — Z5181 Encounter for therapeutic drug level monitoring: Secondary | ICD-10-CM

## 2019-12-27 LAB — POCT INR: INR: 3.3 — AB (ref 2.0–3.0)

## 2019-12-27 NOTE — Patient Instructions (Signed)
Description   Continue on same dosage 1 tablet daily except 1.5 tablets on Mondays. Continue drinking 2 Ensures each day. Recheck INR in 2 weeks. Please call [phone] if you have any procedures or medication changes.

## 2019-12-28 NOTE — Addendum Note (Signed)
Encounter addended by: Harvie Junior, CMA on: 12/28/2019 10:18 AM  Actions taken: Charge Capture section accepted

## 2020-01-10 ENCOUNTER — Ambulatory Visit: Payer: Medicare Other | Admitting: *Deleted

## 2020-01-10 ENCOUNTER — Other Ambulatory Visit: Payer: Self-pay

## 2020-01-10 DIAGNOSIS — Z5181 Encounter for therapeutic drug level monitoring: Secondary | ICD-10-CM | POA: Diagnosis not present

## 2020-01-10 DIAGNOSIS — I059 Rheumatic mitral valve disease, unspecified: Secondary | ICD-10-CM | POA: Diagnosis not present

## 2020-01-10 LAB — POCT INR: INR: 3.5 — AB (ref 2.0–3.0)

## 2020-01-10 NOTE — Patient Instructions (Signed)
Description   Continue on same dosage 1 tablet daily except 1.5 tablets on Mondays. Continue drinking 2 Ensures each day. Recheck INR in 3 weeks. Please call [phone] if you have any procedures or medication changes.

## 2020-01-29 ENCOUNTER — Encounter: Payer: Self-pay | Admitting: Gastroenterology

## 2020-01-29 ENCOUNTER — Ambulatory Visit: Payer: Medicare Other | Admitting: Gastroenterology

## 2020-01-29 VITALS — BP 122/60 | HR 81 | Ht 73.0 in | Wt 140.0 lb

## 2020-01-29 DIAGNOSIS — D132 Benign neoplasm of duodenum: Secondary | ICD-10-CM | POA: Diagnosis not present

## 2020-01-29 DIAGNOSIS — K7460 Unspecified cirrhosis of liver: Secondary | ICD-10-CM | POA: Diagnosis not present

## 2020-01-29 DIAGNOSIS — Z86010 Personal history of colonic polyps: Secondary | ICD-10-CM | POA: Diagnosis not present

## 2020-01-29 DIAGNOSIS — Z7901 Long term (current) use of anticoagulants: Secondary | ICD-10-CM

## 2020-01-29 NOTE — Progress Notes (Signed)
HPI :  69 year old male with a history of A. fib, history of mitral repair on chronic Coumadin, history of pulmonary hypertension with severe RV reduced cardiac output, history of HCV cirrhosis and colon polyps, here for follow-up visit.  In regards to his cirrhosis has been doing pretty well.  He has been compensated.  He had an EGD with me in August which showed no esophageal or gastric varices.  He has had no problems with ascites.  Generally doing pretty well.  He had his last [place] screening with an ultrasound in June which was negative.  He avoids all alcohol.  Recall that he was previously diagnosed with cirrhosis related to chronic hep C at [facility], previously was treated with Harvoni and eradicated.  He is never had hepatic encephalopathy or other decompensations we are aware of.  Incidentally as part of his EGD he was found to have a polypoid lesion in the duodenal bulb, was not convinced it was a true adenoma biopsy was obtained, unfortunately this returned as adenomatous.  We discussed what this was and recommendations to have it removed.  He continues to take Coumadin daily and require Lovenox bridge for his prior EGD and colonoscopy.  He also had a colonoscopy with me in August.  3 small polyps removed, one via hot snare in the left colon.  Given his need for Lovenox bridge and resumption of anticoagulation hemostasis clip was placed along the polyp that was removed via hot snare.  Despite doing this he had bleeding about a week after the exam requiring hospitalization placement of hemoclips at the site of the polypectomy.  Polyps were removed returned as adenomatous.  He has not had any bleeding symptoms in the interim and generally doing pretty well.  He denies any cardiopulmonary symptoms at this time.  Prior work-up: Colonoscopy 11/14/2012 - 2 polyps removed, largest 1.4cm  EGD 05/08/2014 - no varices, antral gastritis  Echocardiogram 10/13/18 - EF 60-65%, severely reduced RV  function  Cardiac cath 11/24/18 - moderate pulm HTN, "moderate to severely reduced cardiac outout"  Colonoscopy 11/19/19 - Diverticulosis in the sigmoid colon. - One 4 to 5 mm polyp in the ascending colon, removed with a cold snare. Resected and retrieved. - One diminutive polyp in the sigmoid colon, removed with a cold biopsy forceps. Resected and retrieved. - One 6 mm polyp in the sigmoid colon, removed with a hot snare. Resected and retrieved. Clip was placed. - The examination was otherwise normal.  EGD 11/19/19 - The examined esophagus was normal. No esophageal varices. Multiple small sessile polyps were found in the gastric antrum. Biopsy was taken with a cold forceps for histology from one of these as a representative sample. The exam of the stomach was otherwise normal. No gastric varices A single 7 to 8 mm sessile polypoid lesion vs. normal variant (brunner's gland hyperplasia) was found in the duodenal bulb. One small biopsy was taken with a cold forceps for histology. The exam of the duodenum was otherwise normal.   Colonoscopy 11/27/19 - Preparation of the colon was poor. - Blood in the sigmoid colon and in the descending colon. - Stool in the entire examined colon. - A single (solitary) ulcer in the recto-sigmoid colon. Clips (MR conditional) were placed on polypectomy site. - The examination was otherwise normal. - No specimens collected.   FINAL MICROSCOPIC DIAGNOSIS:   A. DUODENUM, POLYPECTOMY:  - Duodenal adenoma (low-grade dysplasia)  - Negative for high-grade dysplasia or carcinoma   B. STOMACH, BIOPSY:  -  Gastric antral mucosa with mild nonspecific reactive gastropathy  - Warthin Starry stain is negative for Helicobacter pylori   C. COLON, ASCENDING, SIGMOID, POLYPECTOMY:  - Tubular adenoma(s)  - Negative for high-grade dysplasia or malignancy    Repeat colonoscopy in 5 years  Past Medical History:  Diagnosis Date  . Allergy   . Aneurysm of  ascending aorta (HCC)   . Atrial fibrillation ([place])   . BPH (benign prostatic hyperplasia)   . Colon polyps   . FHx: rheumatic heart disease   . H/O diplopia   . Hepatitis C   . Hypertension   . Hypogonadism male   . Mitral valve disease    mitral valve repair/replacement x3     Past Surgical History:  Procedure Laterality Date  . BIOPSY  11/19/2019   Procedure: BIOPSY;  Surgeon: Yetta Flock, MD;  Location: [facility];  Service: Gastroenterology;;  EGD and COLON  . COLON SURGERY    . COLONOSCOPY    . COLONOSCOPY N/A 11/14/2012   Procedure: COLONOSCOPY;  Surgeon: Lear Ng, MD;  Location: [facility];  Service: Endoscopy;  Laterality: N/A;  . COLONOSCOPY WITH PROPOFOL N/A 11/19/2019   Procedure: COLONOSCOPY WITH PROPOFOL;  Surgeon: Yetta Flock, MD;  Location: [facility];  Service: Gastroenterology;  Laterality: N/A;  . COLONOSCOPY WITH PROPOFOL N/A 11/27/2019   Procedure: COLONOSCOPY WITH PROPOFOL;  Surgeon: Doran Stabler, MD;  Location: [facility];  Service: Gastroenterology;  Laterality: N/A;  . ESOPHAGOGASTRODUODENOSCOPY (EGD) WITH PROPOFOL N/A 05/08/2014   Procedure: ESOPHAGOGASTRODUODENOSCOPY (EGD) WITH PROPOFOL;  Surgeon: Lear Ng, MD;  Location: [facility];  Service: Endoscopy;  Laterality: N/A;  . ESOPHAGOGASTRODUODENOSCOPY (EGD) WITH PROPOFOL N/A 11/19/2019   Procedure: ESOPHAGOGASTRODUODENOSCOPY (EGD) WITH PROPOFOL;  Surgeon: Yetta Flock, MD;  Location: [facility];  Service: Gastroenterology;  Laterality: N/A;  . HEMOSTASIS CLIP PLACEMENT  11/19/2019   Procedure: HEMOSTASIS CLIP PLACEMENT;  Surgeon: Yetta Flock, MD;  Location: [facility];  Service: Gastroenterology;;  . HEMOSTASIS CLIP PLACEMENT  11/27/2019   Procedure: HEMOSTASIS CLIP PLACEMENT;  Surgeon: Doran Stabler, MD;  Location: [facility];  Service: Gastroenterology;;  . MITRAL VALVE REPLACEMENT     1973 (bioprosthesis), 1988 (St Jude  mechanical vlave) , 2006 (repair due to leak)  . POLYPECTOMY  11/19/2019   Procedure: POLYPECTOMY;  Surgeon: Yetta Flock, MD;  Location: [facility];  Service: Gastroenterology;;  . RIGHT HEART CATH N/A 11/24/2018   Procedure: RIGHT HEART CATH;  Surgeon: Jolaine Artist, MD;  Location: [facility];  Service: Cardiovascular;  Laterality: N/A;  . RIGHT HEART CATH N/A 10/23/2019   Procedure: RIGHT HEART CATH;  Surgeon: Jolaine Artist, MD;  Location: [facility];  Service: Cardiovascular;  Laterality: N/A;  . UPPER GASTROINTESTINAL ENDOSCOPY     02-2010 and 05-2014   Family History  Family history unknown: Yes   Social History   Tobacco Use  . Smoking status: Former Smoker    Types: Cigars    Quit date: 09/03/2017    Years since quitting: 2.4  . Smokeless tobacco: Never Used  Vaping Use  . Vaping Use: Never used  Substance Use Topics  . Alcohol use: No  . Drug use: No   Current Outpatient Medications  Medication Sig Dispense Refill  . ferrous sulfate 325 (65 FE) MG tablet Take 1 tablet (325 mg total) by mouth every other day. In the evening.  3  . furosemide (LASIX) 40 MG tablet Take  40 mg by mouth daily.    . polyethylene glycol (MIRALAX) 17 g packet Take 17 g by mouth daily as needed. (Patient taking differently: Take 17 g by mouth every three (3) days as needed (constipation.). ) 14 each 0  . potassium chloride SA (KLOR-CON) 20 MEQ tablet Take 1 tablet (20 mEq total) by mouth daily. 90 tablet 3  . sildenafil (REVATIO) 20 MG tablet Take 20 mg by mouth daily as needed (ed).     . spironolactone (ALDACTONE) 25 MG tablet Take 0.5 tablets (12.5 mg total) by mouth daily. 15 tablet 3  . testosterone cypionate (DEPOTESTOSTERONE CYPIONATE) 200 MG/ML injection Inject 60 mg into the muscle every Thursday. 0.3 ml every week    . warfarin (COUMADIN) 5 MG tablet Take 0.5-1 tablets (2.5-5 mg total) by mouth See admin instructions. Take 0.5 tablet (2.5 mg) by mouth on  Thursdays & Saturdays, take 1 tablet (5 mg) by mouth on Sundays, Mondays, Tuesdays, Wednesdays, & Fridays. 90 tablet 0   No current facility-administered medications for this visit.   Allergies  Allergen Reactions  . Penicillins Rash    Did it involve swelling of the face/tongue/throat, SOB, or low BP? No Did it involve sudden or severe rash/hives, skin peeling, or any reaction on the inside of your mouth or nose? No Did you need to seek medical attention at a hospital or doctor's office? No When did it last happen?30 + years If all above answers are "NO", may proceed with cephalosporin use.      Review of Systems: All systems reviewed and negative except where noted in HPI.   Lab Results  Component Value Date   WBC 4.4 12/06/2019   HGB 8.8 (L) 12/06/2019   HCT 29.5 (L) 12/06/2019   MCV 99.0 12/06/2019   PLT 124 (L) 12/06/2019    Lab Results  Component Value Date   CREATININE 2.41 (H) 12/20/2019   BUN 30 (H) 12/20/2019   NA 138 12/20/2019   K 3.8 12/20/2019   CL 107 12/20/2019   CO2 22 12/20/2019    Lab Results  Component Value Date   ALT 35 11/27/2019   AST 109 (H) 11/27/2019   ALKPHOS 35 (L) 11/27/2019   BILITOT 2.0 (H) 11/27/2019     Physical Exam: BP 122/60   Pulse 81   Ht 6\' 1"  (1.854 m)   Wt 140 lb (63.5 kg)   SpO2 94%   BMI 18.47 kg/m  Constitutional: Pleasant,well-developed, male in no acute distress. Abdominal: Soft, nondistended, nontender. There are no masses palpable.  Extremities: no edema Lymphadenopathy: No cervical adenopathy noted. Neurological: Alert and oriented to person place and time. Skin: Skin is warm and dry. No rashes noted. Psychiatric: Normal mood and affect. Behavior is normal.   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  He has some occasional lower extremity edema for which he takes Lasix and  low-dose Aldactone and tolerates that well.  He has no varices on recent EGD.  [place] screening is up-to-date, due for another ultrasound in December.  He is due for basic labs in February.  He is abstaining from alcohol, overall doing okay, hep C had been eradicated.  Hopefully he maintains stability over time, although understands risks of decompensation and HCC.  He will continue to see [place] every 6 months.  Duodenal adenoma / anticoagulated - incidentally noted during EGD for screening for varices,  I was not convinced this was an adenoma at the time and took a biopsy and unfortunately path is consistent with an adenoma.  Given his cardiovascular history /pulmonary hypertension, his endoscopy will need to be done at the hospital for anesthesia support.  He will need to have a Lovenox bridge for this exam.  He is at higher than average risk for bleeding from polypectomy site, will plan on taking measures to reduce this risk.  I discussed timing of when he wanted to do this after discussion of risks and benefits, he is comfortable proceeding with this in February.  We will add him to the wait list for EGD at the hospital in February.  We will need to get approval to hold his anticoagulation prior to that exam.  He agrees  History of colon polyps - due for surveillance colonoscopy in 5 years.  Unfortunately he had post polypectomy bleeding despite placement of hemostasis clips in the setting of his Lovenox bridge.  He is understanding of the situation and has recovered well from this.    Steele Cellar, MD [facility]

## 2020-01-29 NOTE — Patient Instructions (Addendum)
If you are age 67 or older, your body mass index should be between 23-30. Your Body mass index is 18.47 kg/m. If this is out of the aforementioned range listed, please consider follow up with your Primary Care Provider.  If you are age 74 or younger, your body mass index should be between 19-25. Your Body mass index is 18.47 kg/m. If this is out of the aformentioned range listed, please consider follow up with your Primary Care Provider.   You have been scheduled for an abdominal ultrasound at [facility] (1st floor of hospital) on 03/10/20 at 10:00qm. Please arrive 15 minutes prior to your appointment for registration. Make certain not to have anything to eat or drink 6 hours prior to your appointment. Should you need to reschedule your appointment, please contact radiology at [phone]. This test typically takes about 30 minutes to perform.  Thank you for entrusting me with your care and for choosing [facility], Dr. [place] Cellar

## 2020-01-31 ENCOUNTER — Ambulatory Visit: Payer: Medicare Other | Admitting: *Deleted

## 2020-01-31 ENCOUNTER — Other Ambulatory Visit: Payer: Self-pay

## 2020-01-31 DIAGNOSIS — Z5181 Encounter for therapeutic drug level monitoring: Secondary | ICD-10-CM

## 2020-01-31 DIAGNOSIS — I059 Rheumatic mitral valve disease, unspecified: Secondary | ICD-10-CM

## 2020-01-31 DIAGNOSIS — I4892 Unspecified atrial flutter: Secondary | ICD-10-CM | POA: Diagnosis not present

## 2020-01-31 LAB — POCT INR: INR: 3.9 — AB (ref 2.0–3.0)

## 2020-01-31 NOTE — Patient Instructions (Addendum)
Description   Do not take any Warfarin today then continue taking Warfarin 1 tablet daily except 1.5 tablets on Mondays. Continue drinking 1-2 Ensures each day. Recheck INR in 3 weeks. Please call [phone] if you have any procedures or medication changes.

## 2020-02-16 ENCOUNTER — Other Ambulatory Visit (HOSPITAL_COMMUNITY): Payer: Self-pay | Admitting: Internal Medicine

## 2020-02-21 ENCOUNTER — Ambulatory Visit: Payer: Medicare Other | Admitting: *Deleted

## 2020-02-21 ENCOUNTER — Other Ambulatory Visit: Payer: Self-pay

## 2020-02-21 DIAGNOSIS — I059 Rheumatic mitral valve disease, unspecified: Secondary | ICD-10-CM | POA: Diagnosis not present

## 2020-02-21 DIAGNOSIS — Z5181 Encounter for therapeutic drug level monitoring: Secondary | ICD-10-CM | POA: Diagnosis not present

## 2020-02-21 LAB — POCT INR: INR: 4.1 — AB (ref 2.0–3.0)

## 2020-02-21 NOTE — Patient Instructions (Signed)
Description   Do not take any Warfarin today then start taking Warfarin 1 tablet . Continue drinking 1-2 Ensures each day. Recheck INR in 3 weeks. Please call [phone] if you have any procedures or medication changes.

## 2020-02-27 ENCOUNTER — Other Ambulatory Visit: Payer: Self-pay | Admitting: Student in an Organized Health Care Education/Training Program

## 2020-03-04 ENCOUNTER — Other Ambulatory Visit: Payer: Self-pay | Admitting: Interventional Cardiology

## 2020-03-04 NOTE — Telephone Encounter (Signed)
Pt's pharmacy is requesting a refill on amoxicillin. Would Dr. Smith like to refill this medication? Please address.  °

## 2020-03-04 NOTE — Telephone Encounter (Signed)
Ok to fill 

## 2020-03-10 ENCOUNTER — Ambulatory Visit (HOSPITAL_COMMUNITY)
Admission: RE | Admit: 2020-03-10 | Discharge: 2020-03-10 | Disposition: A | Discharge status: Home / Self Care | Payer: Medicare Other | Source: Ambulatory Visit | Attending: Gastroenterology | Admitting: Gastroenterology

## 2020-03-10 ENCOUNTER — Other Ambulatory Visit: Payer: Self-pay

## 2020-03-10 DIAGNOSIS — K7460 Unspecified cirrhosis of liver: Secondary | ICD-10-CM | POA: Diagnosis present

## 2020-03-10 DIAGNOSIS — Z7901 Long term (current) use of anticoagulants: Secondary | ICD-10-CM | POA: Insufficient documentation

## 2020-03-10 DIAGNOSIS — D132 Benign neoplasm of duodenum: Secondary | ICD-10-CM | POA: Insufficient documentation

## 2020-03-10 DIAGNOSIS — Z86010 Personal history of colonic polyps: Secondary | ICD-10-CM | POA: Insufficient documentation

## 2020-03-12 NOTE — Progress Notes (Addendum)
Cardiology Office Note:    Date:  03/13/2020   ID:  [name], DOB Feb 26, 1951, MRN [id number]  PCP:  Jesse Osmond, DO  Cardiologist:  Jesse Grooms, MD   Referring MD: Jesse Osmond, DO   Chief Complaint  Patient presents with  . Congestive Heart Failure  . Atrial Fibrillation  . Cardiac Valve Problem    History of Present Illness:    [name] is a 69 y.o. 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 Other problems include hepatitis C, chronic atrial flutter, ascending aortic aneurysm 4.5 cm, chronic kidney disease stage III,chronic anemiaand chronic anticoagulation.  Week, having difficulty sleeping, poor appetite to the extent that he forces himself to eat, has occasional hematuria, had bleeding after colonoscopy with polyp removal.  He denies orthopnea PND.  Left lower extremity swells more than right.  Both legs decrease in size overnight.  He is fully retired.  He tries to stay busy.  I encouraged physical activity to maintain endurance.  Past Medical History:  Diagnosis Date  . Allergy   . Aneurysm of ascending aorta (HCC)   . Atrial fibrillation ([place])   . BPH (benign prostatic hyperplasia)   . Colon polyps   . FHx: rheumatic heart disease   . H/O diplopia   . Hepatitis C   . Hypertension   . Hypogonadism male   . Mitral valve disease    mitral valve repair/replacement x3    Past Surgical History:  Procedure Laterality Date  . BIOPSY  11/19/2019   Procedure: BIOPSY;  Surgeon: Yetta Flock, MD;  Location: [facility];  Service: Gastroenterology;;  EGD and COLON  . COLON SURGERY    . COLONOSCOPY    . COLONOSCOPY N/A 11/14/2012   Procedure: COLONOSCOPY;  Surgeon: Lear Ng, MD;  Location: [facility];  Service: Endoscopy;  Laterality: N/A;  .  COLONOSCOPY WITH PROPOFOL N/A 11/19/2019   Procedure: COLONOSCOPY WITH PROPOFOL;  Surgeon: Yetta Flock, MD;  Location: [facility];  Service: Gastroenterology;  Laterality: N/A;  . COLONOSCOPY WITH PROPOFOL N/A 11/27/2019   Procedure: COLONOSCOPY WITH PROPOFOL;  Surgeon: Doran Stabler, MD;  Location: [facility];  Service: Gastroenterology;  Laterality: N/A;  . ESOPHAGOGASTRODUODENOSCOPY (EGD) WITH PROPOFOL N/A 05/08/2014   Procedure: ESOPHAGOGASTRODUODENOSCOPY (EGD) WITH PROPOFOL;  Surgeon: Lear Ng, MD;  Location: [place];  Service: Endoscopy;  Laterality: N/A;  . ESOPHAGOGASTRODUODENOSCOPY (EGD) WITH PROPOFOL N/A 11/19/2019   Procedure: ESOPHAGOGASTRODUODENOSCOPY (EGD) WITH PROPOFOL;  Surgeon: Yetta Flock, MD;  Location: [facility];  Service: Gastroenterology;  Laterality: N/A;  . HEMOSTASIS CLIP PLACEMENT  11/19/2019   Procedure: HEMOSTASIS CLIP PLACEMENT;  Surgeon: Yetta Flock, MD;  Location: [facility];  Service: Gastroenterology;;  . HEMOSTASIS CLIP PLACEMENT  11/27/2019   Procedure: HEMOSTASIS CLIP PLACEMENT;  Surgeon: Doran Stabler, MD;  Location: [facility];  Service: Gastroenterology;;  . MITRAL VALVE REPLACEMENT     1973 (bioprosthesis), 1988 (St Jude mechanical vlave) , 2006 (repair due to leak)  . POLYPECTOMY  11/19/2019   Procedure: POLYPECTOMY;  Surgeon: Yetta Flock, MD;  Location: [facility];  Service: Gastroenterology;;  . RIGHT HEART CATH N/A 11/24/2018   Procedure: RIGHT HEART CATH;  Surgeon: Jolaine Artist, MD;  Location: [place] [facility];  Service: Cardiovascular;  Laterality: N/A;  .  RIGHT HEART CATH N/A 10/23/2019   Procedure: RIGHT HEART CATH;  Surgeon: Jolaine Artist, MD;  Location: [place] [facility];  Service: Cardiovascular;  Laterality: N/A;  . UPPER GASTROINTESTINAL ENDOSCOPY     02-2010 and 05-2014    Current Medications: Current Meds  Medication Sig  . amoxicillin (AMOXIL) 500 MG  capsule TAKE 4 CAPSULES BY MOUTH 1 HOUR BEFORE DENTAL APPOINTMENT  . ferrous sulfate 325 (65 FE) MG tablet Take 1 tablet (325 mg total) by mouth every other day. In the evening.  . furosemide (LASIX) 40 MG tablet Take 40 mg by mouth daily.  . polyethylene glycol (MIRALAX) 17 g packet Take 17 g by mouth daily as needed.  . potassium chloride SA (KLOR-CON) 20 MEQ tablet Take 1 tablet (20 mEq total) by mouth daily.  . sildenafil (REVATIO) 20 MG tablet Take 20 mg by mouth daily as needed (ed).   . spironolactone (ALDACTONE) 25 MG tablet TAKE 1/2 TABLET(12.5 MG) BY MOUTH DAILY  . Testosterone 1.62 % GEL Apply 2 Pump topically daily. 1 pump on each arm  . warfarin (COUMADIN) 5 MG tablet TAKE 1/2 TABLET BY MOUTH ON THURSDAY AND SATURDAY. THEN TAKE 1 TABLET BY MOUTH ON SUNDAY, [place], [place], [place], [place].     Allergies:   Penicillins   Social History   Socioeconomic History  . Marital status: Divorced    Spouse name: Not on file  . Number of children: 2  . Years of education: Not on file  . Highest education level: Some college, no degree  Occupational History  . Not on file  Tobacco Use  . Smoking status: Former Smoker    Types: Cigars    Quit date: 09/03/2017    Years since quitting: 2.5  . Smokeless tobacco: Never Used  Vaping Use  . Vaping Use: Never used  Substance and Sexual Activity  . Alcohol use: No  . Drug use: No  . Sexual activity: Not on file  Other Topics Concern  . Not on file  Social History Narrative   Lives at home alone    Left handed   Caffeine: 1-2 cups daily   Social Determinants of Health   Financial Resource Strain: Not on file  Food Insecurity: Not on file  Transportation Needs: Not on file  Physical Activity: Not on file  Stress: Not on file  Social Connections: Not on file     Family History: The patient's Family history is unknown by patient.  ROS:   Please see the history of present illness.    Feels depressed.  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  Has an appointment coming up with next nephrology.  Is considering getting a second opinion advanced heart failure consultation at [facility].  All other systems reviewed and are negative.  EKGs/Labs/Other Studies Reviewed:    The following studies were reviewed today:  2D Doppler echocardiogram 01/04/2019: IMPRESSIONS    1. The left ventricle has normal systolic function with an ejection  fraction of 60-65%. The cavity size was normal. There is moderately  increased left ventricular wall thickness. Left ventricular diastolic  function could not be evaluated secondary to  atrial fibrillation.  2. The right ventricle has severely reduced systolic function. The cavity  was normal. Right ventricular systolic pressure is moderately elevated  with an estimated pressure of 54.9 mmHg.  3. Left atrial size was severely dilated.  4.  The tricuspid valve is grossly normal.  5. The aortic valve is tricuspid. Mild thickening of the aortic valve.  Aortic valve regurgitation is mild by color flow Doppler. No stenosis of  the aortic valve.  6. There is mild dilatation of the aortic root and of the ascending aorta  measuring 44 mm.  7. Normal LV systolic function; moderate LVH; mildly dilated aortic root  (4.4 cm); mild AI; s/p MVR with mean gradient of 5 mmHg; severe LAE;  severe RV dysfunction; mild TR; estimated RVSP moderately elevated.   RIGHT HEART CATH 10/2019: Findings:  RA = 12 RV = 65/12 PA = 65/24 (41) PCW = 20 (v-wave 37) Fick cardiac output/index = 3.85/2.13 PVR = 5.5 FA sat = 98% PA sat = 52%, 54% PAPi = 3.4  Assessment: 1. Moderate mixed PH with moderately depressed cardiac outpuT. Plan/Discussion:  Relatively stable hemodynamics. With elevated PVR consider careful inititiation of PDE-5 at next visit.    EKG:  EKG not repeated  Recent  Labs: 09/27/2019: TSH 2.06 11/27/2019: ALT 35 12/06/2019: B Natriuretic Peptide 466.3; Hemoglobin 8.8; Platelets 124 12/20/2019: BUN 30; Creatinine, Ser 2.41; Potassium 3.8; Sodium 138  Recent Lipid Panel No results found for: CHOL, TRIG, HDL, CHOLHDL, VLDL, LDLCALC, LDLDIRECT  Physical Exam:    VS:  BP 108/64   Pulse 75   Ht 6\' 1"  (1.854 m)   Wt 140 lb 12.8 oz (63.9 kg)   SpO2 98%   BMI 18.58 kg/m     Wt Readings from Last 3 Encounters:  03/13/20 140 lb 12.8 oz (63.9 kg)  01/29/20 140 lb (63.5 kg)  12/06/19 133 lb 2 oz (60.4 kg)     GEN: Cachectic appearing although his weight is stable compared to 2 months ago and better than 3 months ago. No acute distress HEENT: Normal NECK: Marked right jugular CV wave with the patient sitting at 90 degrees and legs dangling. LYMPHATICS: No lymphadenopathy CARDIAC: Irregular RR without murmur, gallop.  2+ ankle to upper shin left lower extremity edema with 1+ and right lower extremity.  VASCULAR:  Normal Pulses. No bruits. RESPIRATORY:  Clear to auscultation without rales, wheezing or rhonchi  ABDOMEN: Soft, non-tender, non-distended, No pulsatile mass, MUSCULOSKELETAL: No deformity  SKIN: Warm and dry NEUROLOGIC:  Alert and oriented x 3 PSYCHIATRIC:  Normal affect   ASSESSMENT:    1. Mechanical mitral valve with surgery x3   2. Chronic atrial flutter (HCC)   3. Chronic diastolic heart failure ([place])   4. Cor pulmonale, chronic ([place])   5. Thoracic aortic aneurysm without rupture ([place])   6. Essential hypertension   7. Cirrhosis of liver with ascites, unspecified hepatic cirrhosis type ([place])   8. Stage 3 chronic kidney disease, unspecified whether stage 3a or 3b CKD ([place])   9. Educated about COVID-19 virus infection   10. Anticoagulation goal of INR 2.5 to 3.5    PLAN:    In order of problems listed above:  1. Crisp mechanical valve closure sounds. 2. Continue chronic anticoagulation therapy. 3. Presumed etiology of right heart  failure.  He has significant LVH. 4. RV systolic function has been documented to be poor.  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  No primary management strategy other than medical treatment can be offered based upon the opinion of our heart failure team.  Refer to the last note by Dr. Haroldine Laws. 5. Did not discuss 6. Relatively low blood pressure on current therapy with Aldactone and Lasix being the only therapies  that would impact his blood pressure. 7. Did not discuss other than relative to the impact that heart function would have on the liver/cardiac cirrhosis. 8. Will be seeing nephrology in January. 9. Vaccinated and has not contracted COVID-19.  He is taking care to practice mitigation techniques. 10. Continue Coumadin therapy.   Mention to me that he may seek a second opinion from advanced heart failure at [facility] where his last heart valve operation was performed.  28-month follow-up with me.   Medication Adjustments/Labs and Tests Ordered: Current medicines are reviewed at length with the patient today.  Concerns regarding medicines are outlined above.  No orders of the defined types were placed in this encounter.  No orders of the defined types were placed in this encounter.   Patient Instructions  Medication Instructions:  Your physician recommends that you continue on your current medications as directed. Please refer to the Current Medication list given to you today.  *If you need a refill on your cardiac medications before your next appointment, please call your pharmacy*   Lab Work: None If you have labs (blood work) drawn today and your tests are completely normal, you will receive your results only by: Marland Kitchen MyChart Message (if you have MyChart) OR . A paper copy in the mail If you have any lab test that is abnormal or we need to change your treatment, we will call you to review the results.   Testing/Procedures: None   Follow-Up: At  [facility], you and your health needs are our priority.  As part of our continuing mission to provide you with exceptional heart care, we have created designated Provider Care Teams.  These Care Teams include your primary Cardiologist (physician) and Advanced Practice Providers (APPs -  Physician Assistants and Nurse Practitioners) who all work together to provide you with the care you need, when you need it.  We recommend signing up for the patient portal called "MyChart".  Sign up information is provided on this After Visit Summary.  MyChart is used to connect with patients for Virtual Visits (Telemedicine).  Patients are able to view lab/test results, encounter notes, upcoming appointments, etc.  Non-urgent messages can be sent to your provider as well.   To learn more about what you can do with MyChart, go to [email or web address].    Your next appointment:   6 month(s)  The format for your next appointment:   In Person  Provider:   You may see Jesse Grooms, MD or one of the following Advanced Practice Providers on your designated Care Team:    Truitt Merle, NP  Cecilie Kicks, NP  Kathyrn Drown, NP    Other Instructions      Signed, Jesse Grooms, MD  03/13/2020 11:05 AM    [place]

## 2020-03-13 ENCOUNTER — Ambulatory Visit: Payer: Medicare Other | Admitting: *Deleted

## 2020-03-13 ENCOUNTER — Other Ambulatory Visit: Payer: Self-pay

## 2020-03-13 ENCOUNTER — Ambulatory Visit: Payer: Medicare Other | Admitting: Interventional Cardiology

## 2020-03-13 ENCOUNTER — Encounter: Payer: Self-pay | Admitting: Interventional Cardiology

## 2020-03-13 VITALS — BP 108/64 | HR 75 | Ht 73.0 in | Wt 140.8 lb

## 2020-03-13 DIAGNOSIS — N183 Chronic kidney disease, stage 3 unspecified: Secondary | ICD-10-CM

## 2020-03-13 DIAGNOSIS — Z7901 Long term (current) use of anticoagulants: Secondary | ICD-10-CM

## 2020-03-13 DIAGNOSIS — I059 Rheumatic mitral valve disease, unspecified: Secondary | ICD-10-CM

## 2020-03-13 DIAGNOSIS — I712 Thoracic aortic aneurysm, without rupture, unspecified: Secondary | ICD-10-CM

## 2020-03-13 DIAGNOSIS — Z5181 Encounter for therapeutic drug level monitoring: Secondary | ICD-10-CM

## 2020-03-13 DIAGNOSIS — Z7189 Other specified counseling: Secondary | ICD-10-CM

## 2020-03-13 DIAGNOSIS — I5032 Chronic diastolic (congestive) heart failure: Secondary | ICD-10-CM

## 2020-03-13 DIAGNOSIS — I2781 Cor pulmonale (chronic): Secondary | ICD-10-CM | POA: Diagnosis not present

## 2020-03-13 DIAGNOSIS — K7460 Unspecified cirrhosis of liver: Secondary | ICD-10-CM

## 2020-03-13 DIAGNOSIS — I4892 Unspecified atrial flutter: Secondary | ICD-10-CM

## 2020-03-13 DIAGNOSIS — R188 Other ascites: Secondary | ICD-10-CM

## 2020-03-13 DIAGNOSIS — I10 Essential (primary) hypertension: Secondary | ICD-10-CM

## 2020-03-13 LAB — POCT INR: INR: 4 — AB (ref 2.0–3.0)

## 2020-03-13 NOTE — Patient Instructions (Signed)

## 2020-03-13 NOTE — Patient Instructions (Signed)
Description   Do not take any Warfarin today then start taking Warfarin 1 tablet except 1/2 tablet on Sundays. Please resume drinking 1-2 Ensures each day. Recheck INR in 3 weeks. Please call [phone] if you have any procedures or medication changes.

## 2020-03-17 ENCOUNTER — Other Ambulatory Visit (HOSPITAL_COMMUNITY): Payer: Self-pay | Admitting: Internal Medicine

## 2020-03-24 ENCOUNTER — Telehealth (HOSPITAL_COMMUNITY): Payer: Self-pay | Admitting: Cardiology

## 2020-03-24 NOTE — Telephone Encounter (Signed)
Pt called to report LE edema and mild SOB Weight 138-142 Pt is compliant with compression stockings  Reviewed medications and has not taken as needed metolazone as ordered   Advised to take  One metolazone and return call if swelling is not resolved   Voiced understanding

## 2020-03-25 ENCOUNTER — Other Ambulatory Visit (HOSPITAL_COMMUNITY): Payer: Self-pay

## 2020-03-25 MED ORDER — METOLAZONE 2.5 MG PO TABS: ORAL_TABLET | ORAL | 2 refills | Status: DC

## 2020-04-03 ENCOUNTER — Ambulatory Visit: Payer: Medicare Other | Admitting: *Deleted

## 2020-04-03 ENCOUNTER — Other Ambulatory Visit: Payer: Self-pay

## 2020-04-03 DIAGNOSIS — Z5181 Encounter for therapeutic drug level monitoring: Secondary | ICD-10-CM | POA: Diagnosis not present

## 2020-04-03 DIAGNOSIS — I4892 Unspecified atrial flutter: Secondary | ICD-10-CM | POA: Diagnosis not present

## 2020-04-03 DIAGNOSIS — I059 Rheumatic mitral valve disease, unspecified: Secondary | ICD-10-CM | POA: Diagnosis not present

## 2020-04-03 LAB — POCT INR: INR: 2.3 (ref 2.0–3.0)

## 2020-04-03 NOTE — Patient Instructions (Signed)
Description   Today take 1.5 tablets then continue taking Warfarin 1 tablet except 1/2 tablet on Sundays. Please resume drinking 1-2 Ensures each day. Recheck INR in 3 weeks. Please call [phone] if you have any procedures or medication changes.

## 2020-04-08 DIAGNOSIS — I5032 Chronic diastolic (congestive) heart failure: Secondary | ICD-10-CM | POA: Diagnosis not present

## 2020-04-08 DIAGNOSIS — I4892 Unspecified atrial flutter: Secondary | ICD-10-CM | POA: Diagnosis not present

## 2020-04-08 DIAGNOSIS — I25811 Atherosclerosis of native coronary artery of transplanted heart without angina pectoris: Secondary | ICD-10-CM | POA: Diagnosis not present

## 2020-04-08 DIAGNOSIS — K7469 Other cirrhosis of liver: Secondary | ICD-10-CM | POA: Diagnosis not present

## 2020-04-08 DIAGNOSIS — I272 Pulmonary hypertension, unspecified: Secondary | ICD-10-CM | POA: Diagnosis not present

## 2020-04-08 DIAGNOSIS — I50810 Right heart failure, unspecified: Secondary | ICD-10-CM | POA: Diagnosis not present

## 2020-04-08 DIAGNOSIS — T8182XD Emphysema (subcutaneous) resulting from a procedure, subsequent encounter: Secondary | ICD-10-CM | POA: Diagnosis not present

## 2020-04-08 DIAGNOSIS — I110 Hypertensive heart disease with heart failure: Secondary | ICD-10-CM | POA: Diagnosis not present

## 2020-04-08 DIAGNOSIS — I4430 Unspecified atrioventricular block: Secondary | ICD-10-CM | POA: Diagnosis not present

## 2020-04-08 DIAGNOSIS — R69 Illness, unspecified: Secondary | ICD-10-CM | POA: Diagnosis not present

## 2020-04-10 ENCOUNTER — Telehealth: Payer: Self-pay

## 2020-04-10 DIAGNOSIS — Z7901 Long term (current) use of anticoagulants: Secondary | ICD-10-CM

## 2020-04-10 DIAGNOSIS — K7460 Unspecified cirrhosis of liver: Secondary | ICD-10-CM

## 2020-04-10 DIAGNOSIS — D132 Benign neoplasm of duodenum: Secondary | ICD-10-CM

## 2020-04-10 NOTE — Telephone Encounter (Signed)
Called and spoke to pt.  Offered him EGD at [facility] on 05-07-20 in the afternoon with Dr. 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 Patient was not sure he could find someone to take him and stay.  He will call me back.  I indicated that we could do his procedure on 3-2 if he can't make 2-2 work. If he can come on 2-2 he will need to be COVID tested on Saturday, 1-29 in the am. Patient asked that I send him the information and my phone number in a MyChart message. Message sent

## 2020-04-11 ENCOUNTER — Telehealth: Payer: Self-pay

## 2020-04-11 ENCOUNTER — Other Ambulatory Visit: Payer: Self-pay

## 2020-04-11 DIAGNOSIS — Z7901 Long term (current) use of anticoagulants: Secondary | ICD-10-CM

## 2020-04-11 DIAGNOSIS — D132 Benign neoplasm of duodenum: Secondary | ICD-10-CM

## 2020-04-11 DIAGNOSIS — K7460 Unspecified cirrhosis of liver: Secondary | ICD-10-CM

## 2020-04-11 NOTE — Telephone Encounter (Signed)
Called and spoke to pt. Relayed all dates and times. He is cleared to hold Coumadin for 5 days before procedure starting on 05-02-20. [facility] will reach out to him re: Lovenox bridge. Mailed instructions to pt  And sent to MyChart.

## 2020-04-11 NOTE — Telephone Encounter (Signed)
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    Procedure: EGD Date of procedure: 05/07/20  CrCl 26 ml/min Platelets 124  Per office protocol, patient can hold warfarin for 5 days prior to procedure.   Patient WILL need bridging with Lovenox (enoxaparin) around procedure.  This will be coordinated in the coumadin clinic

## 2020-04-11 NOTE — Telephone Encounter (Signed)
Patient notified to hold Coumadin starting on 05-02-20. [facility] will be in touch with him regarding a Lovenox bridge. He has an upcoming appt on 1-18 with Coumadin clinic.  Patient indicated he had to come out of pocket for the Lovenox bridge the last time and it was quite expensive as insurance did not pay for the medication.

## 2020-04-11 NOTE — Telephone Encounter (Signed)
Scheduled pt for EGD at [facility] on Wednesday, 2-2 at 12:30pm Case: [id number]. Covid test on Saturday, 05-03-20 at 10:10am. Will mail instructions to pt. He is on Coumadin - Clearance request to hold for 5 days sent to [facility].

## 2020-04-11 NOTE — Telephone Encounter (Signed)
[facility] Pre-operative Risk Assessment     Request for surgical clearance:     Endoscopy Procedure  What type of surgery is being performed?     EGD  When is this surgery scheduled?  05-07-2020  What type of clearance is required ?   Pharmacy  Are there any medications that need to be held prior to surgery and how long? Coumadin 5 days  Practice name and name of physician performing surgery?   [facility]  What is your office phone and fax number?  Tia Alert, [place]   Phone- [phone]  [phone]  Anesthesia type (None, local, MAC, general) ?       MAC    Thank you!

## 2020-04-11 NOTE — Telephone Encounter (Signed)
   Primary Cardiologist: Sinclair Grooms, MD  Chart reviewed as part of pre-operative protocol coverage.   Per pharmacy recommendations, patient can hold coumadin 5 days prior to his upcoming EGD with plans to restart as soon as he is cleared to do so by his gastroenterologist. He WILL NEED a lovenox bridge in the peri-procedural period. This will be coordinated by our coumadin clinic prior to his procedure.   I will route this recommendation to the requesting party via Epic fax function and remove from pre-op pool.  Please call with questions.  Abigail Butts, PA-C 04/11/2020, 4:42 PM

## 2020-04-11 NOTE — Telephone Encounter (Signed)
Thank you :)

## 2020-04-15 DIAGNOSIS — R948 Abnormal results of function studies of other organs and systems: Secondary | ICD-10-CM | POA: Diagnosis not present

## 2020-04-15 DIAGNOSIS — E291 Testicular hypofunction: Secondary | ICD-10-CM | POA: Diagnosis not present

## 2020-04-24 ENCOUNTER — Ambulatory Visit: Payer: Medicare HMO | Admitting: *Deleted

## 2020-04-24 ENCOUNTER — Other Ambulatory Visit: Payer: Self-pay

## 2020-04-24 DIAGNOSIS — Z5181 Encounter for therapeutic drug level monitoring: Secondary | ICD-10-CM

## 2020-04-24 DIAGNOSIS — I059 Rheumatic mitral valve disease, unspecified: Secondary | ICD-10-CM | POA: Diagnosis not present

## 2020-04-24 DIAGNOSIS — I4892 Unspecified atrial flutter: Secondary | ICD-10-CM

## 2020-04-24 LAB — POCT INR: INR: 3.8 — AB (ref 2.0–3.0)

## 2020-04-24 MED ORDER — ENOXAPARIN SODIUM 60 MG/0.6ML ~~LOC~~ SOLN: 60.0000 mg | SUBCUTANEOUS | 1 refills | Status: DC

## 2020-04-24 NOTE — Patient Instructions (Addendum)
Description   Refer to Lovenox bridging instructions for upcoming procedure on 05/07/2020. Do not take any Warfarin today then continue taking Warfarin 1 tablet except 1/2 tablet on Sundays. Please resume drinking 1-2 Ensures each day. Recheck INR in 1 week after procedure. Please call [phone] if you have any procedures or medication changes.     1/27: Last dose of warfarin.  1/28: No warfarin or enoxaparin (Lovenox).  1/29: Inject enoxaparin 60mg  in the fatty abdominal tissue at least 2 inches from the belly button daily at 8pm rotate sites. No warfarin.  1/30: Inject enoxaparin in the fatty tissue at 8pm. No warfarin.  1/31: Inject enoxaparin in the fatty tissue at 8pm. No warfarin.  2/1: No Lovenox and No Warfarin.   2/2: Procedure Day - No enoxaparin - Resume warfarin in the evening or as directed by doctor (take an extra half tablet with usual dose for 2 days then resume normal dose).  2/3: Resume enoxaparin inject in the fatty tissue at 8am and take warfarin (take an extra half tablet with usual dose for 2 days then resume normal dose).  2/4: Inject enoxaparin in the fatty tissue at 8am and take warfarin  2/5: Inject enoxaparin in the fatty tissue at 8am and take warfarin  2/6: Inject enoxaparin in the fatty tissue at 8am and take warfarin  2/7: Inject enoxaparin in the fatty tissue at 8am and take warfarin  2/8: Inject enoxaparin in the fatty tissue at 8am and report for INR check.

## 2020-05-03 ENCOUNTER — Other Ambulatory Visit (HOSPITAL_COMMUNITY): Payer: Medicare Other

## 2020-05-05 ENCOUNTER — Other Ambulatory Visit (HOSPITAL_COMMUNITY)
Admission: RE | Admit: 2020-05-05 | Discharge: 2020-05-05 | Disposition: A | Discharge status: Home / Self Care | Payer: Medicare HMO | Source: Ambulatory Visit | Attending: Gastroenterology | Admitting: Gastroenterology

## 2020-05-05 DIAGNOSIS — Z01812 Encounter for preprocedural laboratory examination: Secondary | ICD-10-CM | POA: Insufficient documentation

## 2020-05-05 DIAGNOSIS — Z20822 Contact with and (suspected) exposure to covid-19: Secondary | ICD-10-CM | POA: Insufficient documentation

## 2020-05-05 LAB — SARS CORONAVIRUS 2 (TAT 6-24 HRS): SARS Coronavirus 2: NEGATIVE

## 2020-05-06 ENCOUNTER — Encounter: Payer: Self-pay | Admitting: *Deleted

## 2020-05-06 ENCOUNTER — Telehealth: Payer: Self-pay | Admitting: Interventional Cardiology

## 2020-05-06 NOTE — Telephone Encounter (Signed)
Returned call to pt to advise him that we haven't received a Surgical Clearance request from the Surgeon's office.  Pt advised that when he had his last INR that he was given instructions. Advised pt that I will forward this over the Coumadin Clinic to let them address.  Pt verbalized understanding.

## 2020-05-06 NOTE — Telephone Encounter (Addendum)
Returned a call to the pt and he states he lost his paperwork from the last Anticoagulation visit, but he did stop Warfarin per the instructions before he lost his paperwork. Therefore, went over instructions day by day from today until upcoming appt on 05/13/2020 with pt. He repeated instructions back as well to confirm he has the instructions correctly. Pt is aware if he has any other questions to call back so we can ensure he does what is needed prior to procedure.   Instructions:  2/1: No Lovenox and No Warfarin.   2/2: Procedure Day - No enoxaparin - Resume warfarin in the evening or as directed by doctor (take an extra half tablet with usual dose for 2 days then resume normal dose).  2/3: Resume enoxaparin inject in the fatty tissue at 8am and take warfarin (take an extra half tablet with usual dose for 2 days then resume normal dose).  2/4: Inject enoxaparin in the fatty tissue at 8am and take warfarin  2/5: Inject enoxaparin in the fatty tissue at 8am and take warfarin  2/6: Inject enoxaparin in the fatty tissue at 8am and take warfarin  2/7: Inject enoxaparin in the fatty tissue at 8am and take warfarin  2/8: Inject enoxaparin in the fatty tissue at 8am and report for INR check.

## 2020-05-06 NOTE — Telephone Encounter (Signed)
New message:    Patient calling stating that he need some information concering his coumadin patient has surgry tomorrow.

## 2020-05-07 ENCOUNTER — Ambulatory Visit (HOSPITAL_COMMUNITY): Payer: Medicare HMO | Admitting: Anesthesiology

## 2020-05-07 ENCOUNTER — Other Ambulatory Visit: Payer: Self-pay

## 2020-05-07 ENCOUNTER — Inpatient Hospital Stay (HOSPITAL_COMMUNITY)
Admission: RE | Admit: 2020-05-07 | Discharge: 2020-05-09 | DRG: 208 | Disposition: A | Discharge status: Home / Self Care | Payer: Medicare HMO | Attending: Pulmonary Disease | Admitting: Pulmonary Disease

## 2020-05-07 ENCOUNTER — Ambulatory Visit (HOSPITAL_COMMUNITY): Payer: Medicare HMO

## 2020-05-07 ENCOUNTER — Encounter (HOSPITAL_COMMUNITY)
Admission: RE | Disposition: A | Discharge status: Home / Self Care | Payer: Self-pay | Source: Home / Self Care | Attending: Critical Care Medicine

## 2020-05-07 ENCOUNTER — Encounter (HOSPITAL_COMMUNITY): Payer: Self-pay | Admitting: Gastroenterology

## 2020-05-07 DIAGNOSIS — Y733 Surgical instruments, materials and gastroenterology and urology devices (including sutures) associated with adverse incidents: Secondary | ICD-10-CM | POA: Diagnosis present

## 2020-05-07 DIAGNOSIS — Z7901 Long term (current) use of anticoagulants: Secondary | ICD-10-CM

## 2020-05-07 DIAGNOSIS — K7460 Unspecified cirrhosis of liver: Secondary | ICD-10-CM

## 2020-05-07 DIAGNOSIS — R0602 Shortness of breath: Secondary | ICD-10-CM

## 2020-05-07 DIAGNOSIS — I482 Chronic atrial fibrillation, unspecified: Secondary | ICD-10-CM | POA: Diagnosis present

## 2020-05-07 DIAGNOSIS — J95821 Acute postprocedural respiratory failure: Principal | ICD-10-CM | POA: Diagnosis present

## 2020-05-07 DIAGNOSIS — N400 Enlarged prostate without lower urinary tract symptoms: Secondary | ICD-10-CM | POA: Diagnosis present

## 2020-05-07 DIAGNOSIS — Z4682 Encounter for fitting and adjustment of non-vascular catheter: Secondary | ICD-10-CM | POA: Diagnosis not present

## 2020-05-07 DIAGNOSIS — G9341 Metabolic encephalopathy: Secondary | ICD-10-CM | POA: Diagnosis not present

## 2020-05-07 DIAGNOSIS — N184 Chronic kidney disease, stage 4 (severe): Secondary | ICD-10-CM | POA: Diagnosis not present

## 2020-05-07 DIAGNOSIS — E440 Moderate protein-calorie malnutrition: Secondary | ICD-10-CM | POA: Diagnosis present

## 2020-05-07 DIAGNOSIS — J811 Chronic pulmonary edema: Secondary | ICD-10-CM | POA: Diagnosis not present

## 2020-05-07 DIAGNOSIS — I130 Hypertensive heart and chronic kidney disease with heart failure and stage 1 through stage 4 chronic kidney disease, or unspecified chronic kidney disease: Secondary | ICD-10-CM | POA: Diagnosis not present

## 2020-05-07 DIAGNOSIS — Z20822 Contact with and (suspected) exposure to covid-19: Secondary | ICD-10-CM | POA: Diagnosis not present

## 2020-05-07 DIAGNOSIS — Z681 Body mass index (BMI) 19 or less, adult: Secondary | ICD-10-CM | POA: Diagnosis not present

## 2020-05-07 DIAGNOSIS — K2970 Gastritis, unspecified, without bleeding: Secondary | ICD-10-CM

## 2020-05-07 DIAGNOSIS — G9340 Encephalopathy, unspecified: Secondary | ICD-10-CM | POA: Diagnosis not present

## 2020-05-07 DIAGNOSIS — I4819 Other persistent atrial fibrillation: Secondary | ICD-10-CM | POA: Diagnosis not present

## 2020-05-07 DIAGNOSIS — I2722 Pulmonary hypertension due to left heart disease: Secondary | ICD-10-CM | POA: Diagnosis present

## 2020-05-07 DIAGNOSIS — D132 Benign neoplasm of duodenum: Secondary | ICD-10-CM | POA: Diagnosis not present

## 2020-05-07 DIAGNOSIS — Z880 Allergy status to penicillin: Secondary | ICD-10-CM

## 2020-05-07 DIAGNOSIS — I129 Hypertensive chronic kidney disease with stage 1 through stage 4 chronic kidney disease, or unspecified chronic kidney disease: Secondary | ICD-10-CM | POA: Diagnosis present

## 2020-05-07 DIAGNOSIS — I50812 Chronic right heart failure: Secondary | ICD-10-CM | POA: Diagnosis not present

## 2020-05-07 DIAGNOSIS — R4182 Altered mental status, unspecified: Secondary | ICD-10-CM | POA: Diagnosis not present

## 2020-05-07 DIAGNOSIS — J9601 Acute respiratory failure with hypoxia: Secondary | ICD-10-CM

## 2020-05-07 DIAGNOSIS — J9602 Acute respiratory failure with hypercapnia: Secondary | ICD-10-CM

## 2020-05-07 DIAGNOSIS — Z8619 Personal history of other infectious and parasitic diseases: Secondary | ICD-10-CM

## 2020-05-07 DIAGNOSIS — R0603 Acute respiratory distress: Secondary | ICD-10-CM

## 2020-05-07 DIAGNOSIS — K317 Polyp of stomach and duodenum: Secondary | ICD-10-CM | POA: Diagnosis not present

## 2020-05-07 DIAGNOSIS — I959 Hypotension, unspecified: Secondary | ICD-10-CM | POA: Diagnosis not present

## 2020-05-07 DIAGNOSIS — I712 Thoracic aortic aneurysm, without rupture: Secondary | ICD-10-CM | POA: Diagnosis present

## 2020-05-07 DIAGNOSIS — J439 Emphysema, unspecified: Secondary | ICD-10-CM | POA: Diagnosis not present

## 2020-05-07 DIAGNOSIS — Z952 Presence of prosthetic heart valve: Secondary | ICD-10-CM | POA: Diagnosis not present

## 2020-05-07 DIAGNOSIS — D6959 Other secondary thrombocytopenia: Secondary | ICD-10-CM | POA: Diagnosis present

## 2020-05-07 DIAGNOSIS — Z87891 Personal history of nicotine dependence: Secondary | ICD-10-CM

## 2020-05-07 DIAGNOSIS — I5032 Chronic diastolic (congestive) heart failure: Secondary | ICD-10-CM | POA: Diagnosis present

## 2020-05-07 DIAGNOSIS — Y838 Other surgical procedures as the cause of abnormal reaction of the patient, or of later complication, without mention of misadventure at the time of the procedure: Secondary | ICD-10-CM | POA: Diagnosis present

## 2020-05-07 DIAGNOSIS — I517 Cardiomegaly: Secondary | ICD-10-CM | POA: Diagnosis not present

## 2020-05-07 DIAGNOSIS — D649 Anemia, unspecified: Secondary | ICD-10-CM | POA: Diagnosis present

## 2020-05-07 DIAGNOSIS — D62 Acute posthemorrhagic anemia: Secondary | ICD-10-CM | POA: Diagnosis not present

## 2020-05-07 DIAGNOSIS — I2729 Other secondary pulmonary hypertension: Secondary | ICD-10-CM | POA: Diagnosis present

## 2020-05-07 HISTORY — PX: ESOPHAGOGASTRODUODENOSCOPY (EGD) WITH PROPOFOL: SHX5813

## 2020-05-07 HISTORY — PX: SUBMUCOSAL LIFTING INJECTION: SHX6855

## 2020-05-07 HISTORY — PX: HEMOSTASIS CLIP PLACEMENT: SHX6857

## 2020-05-07 HISTORY — PX: POLYPECTOMY: SHX5525

## 2020-05-07 LAB — BLOOD GAS, ARTERIAL
Acid-base deficit: 5.4 mmol/L — ABNORMAL HIGH (ref 0.0–2.0)
Acid-base deficit: 1.1 mmol/L (ref 0.0–2.0)
Bicarbonate: 23.5 mmol/L (ref 20.0–28.0)
Bicarbonate: 25.6 mmol/L (ref 20.0–28.0)
Drawn by: 331471
Drawn by: 331471
FIO2: 100
FIO2: 35
MECHVT: 640 mL
O2 Saturation: 44.8 %
O2 Saturation: 26.9 %
PEEP: 5 cmH2O
Patient temperature: 98.6
Patient temperature: 98.6
RATE: 18 resp/min
pCO2 arterial: 68.8 mmHg (ref 32.0–48.0)
pCO2 arterial: 57.3 mmHg — ABNORMAL HIGH (ref 32.0–48.0)
pH, Arterial: 7.16 — CL (ref 7.350–7.450)
pH, Arterial: 7.272 — ABNORMAL LOW (ref 7.350–7.450)
pO2, Arterial: 35.6 mmHg — CL (ref 83.0–108.0)
pO2, Arterial: <31 mmHg — CL (ref 83.0–108.0)

## 2020-05-07 LAB — TYPE AND SCREEN
ABO/RH(D): O POS
Antibody Screen: NEGATIVE

## 2020-05-07 LAB — CBC
HCT: 32 % — ABNORMAL LOW (ref 39.0–52.0)
Hemoglobin: 9.7 g/dL — ABNORMAL LOW (ref 13.0–17.0)
MCH: 34 pg (ref 26.0–34.0)
MCHC: 30.3 g/dL (ref 30.0–36.0)
MCV: 112.3 fL — ABNORMAL HIGH (ref 80.0–100.0)
Platelets: 85 10*3/uL — ABNORMAL LOW (ref 150–400)
RBC: 2.85 MIL/uL — ABNORMAL LOW (ref 4.22–5.81)
RDW: 23.6 % — ABNORMAL HIGH (ref 11.5–15.5)
WBC: 6.2 10*3/uL (ref 4.0–10.5)
nRBC: 0 % (ref 0.0–0.2)

## 2020-05-07 LAB — BLOOD GAS, VENOUS
Acid-base deficit: 2.8 mmol/L — ABNORMAL HIGH (ref 0.0–2.0)
Bicarbonate: 23 mmol/L (ref 20.0–28.0)
O2 Saturation: 49.2 %
Patient temperature: 98.6
pCO2, Ven: 47.7 mmHg (ref 44.0–60.0)
pH, Ven: 7.305 (ref 7.250–7.430)
pO2, Ven: 32.9 mmHg (ref 32.0–45.0)

## 2020-05-07 LAB — COMPREHENSIVE METABOLIC PANEL
ALT: 41 U/L (ref 0–44)
AST: 105 U/L — ABNORMAL HIGH (ref 15–41)
Albumin: 3.7 g/dL (ref 3.5–5.0)
Alkaline Phosphatase: 55 U/L (ref 38–126)
Anion gap: 8 (ref 5–15)
BUN: 33 mg/dL — ABNORMAL HIGH (ref 8–23)
CO2: 25 mmol/L (ref 22–32)
Calcium: 8.4 mg/dL — ABNORMAL LOW (ref 8.9–10.3)
Chloride: 105 mmol/L (ref 98–111)
Creatinine, Ser: 2.49 mg/dL — ABNORMAL HIGH (ref 0.61–1.24)
GFR, Estimated: 28 mL/min — ABNORMAL LOW (ref >60)
Glucose, Bld: 102 mg/dL — ABNORMAL HIGH (ref 70–99)
Potassium: 3.7 mmol/L (ref 3.5–5.1)
Sodium: 138 mmol/L (ref 135–145)
Total Bilirubin: 2.8 mg/dL — ABNORMAL HIGH (ref 0.3–1.2)
Total Protein: 8.1 g/dL (ref 6.5–8.1)

## 2020-05-07 LAB — PROTIME-INR
INR: 1.6 — ABNORMAL HIGH (ref 0.8–1.2)
Prothrombin Time: 18 seconds — ABNORMAL HIGH (ref 11.4–15.2)

## 2020-05-07 LAB — HIV ANTIBODY (ROUTINE TESTING W REFLEX): HIV Screen 4th Generation wRfx: NONREACTIVE

## 2020-05-07 LAB — TROPONIN I (HIGH SENSITIVITY): Troponin I (High Sensitivity): 40 ng/L — ABNORMAL HIGH (ref <18)

## 2020-05-07 LAB — MRSA PCR SCREENING: MRSA by PCR: NEGATIVE

## 2020-05-07 LAB — GLUCOSE, CAPILLARY: Glucose-Capillary: 140 mg/dL — ABNORMAL HIGH (ref 70–99)

## 2020-05-07 SURGERY — ESOPHAGOGASTRODUODENOSCOPY (EGD) WITH PROPOFOL
Anesthesia: Monitor Anesthesia Care

## 2020-05-07 MED ORDER — ORAL CARE MOUTH RINSE
15.0000 mL | OROMUCOSAL | Status: DC
  Administered 2020-05-07 (×2): 15 mL via OROMUCOSAL

## 2020-05-07 MED ORDER — CHLORHEXIDINE GLUCONATE CLOTH 2 % EX PADS
6.0000 | MEDICATED_PAD | Freq: Every day | CUTANEOUS | Status: DC
  Administered 2020-05-07 – 2020-05-08 (×2): 6 via TOPICAL

## 2020-05-07 MED ORDER — PROPOFOL 500 MG/50ML IV EMUL
INTRAVENOUS | Status: DC | PRN
  Administered 2020-05-07: 125 ug/kg/min via INTRAVENOUS

## 2020-05-07 MED ORDER — ENSURE ENLIVE PO LIQD
237.0000 mL | Freq: Two times a day (BID) | ORAL | Status: DC
  Administered 2020-05-08 – 2020-05-09 (×3): 237 mL via ORAL

## 2020-05-07 MED ORDER — PANTOPRAZOLE SODIUM 40 MG IV SOLR
40.0000 mg | INTRAVENOUS | Status: DC
  Administered 2020-05-07: 40 mg via INTRAVENOUS
  Filled 2020-05-07: qty 40

## 2020-05-07 MED ORDER — POTASSIUM CHLORIDE CRYS ER 20 MEQ PO TBCR
20.0000 meq | EXTENDED_RELEASE_TABLET | Freq: Every day | ORAL | Status: DC
  Administered 2020-05-08 – 2020-05-09 (×2): 20 meq via ORAL
  Filled 2020-05-07 (×2): qty 1

## 2020-05-07 MED ORDER — FUROSEMIDE 40 MG PO TABS
40.0000 mg | ORAL_TABLET | Freq: Every day | ORAL | Status: DC
  Administered 2020-05-08 – 2020-05-09 (×2): 40 mg via ORAL
  Filled 2020-05-07 (×2): qty 1

## 2020-05-07 MED ORDER — ONDANSETRON HCL 4 MG/2ML IJ SOLN: 4.0000 mg | Freq: Four times a day (QID) | INTRAMUSCULAR | Status: DC | PRN

## 2020-05-07 MED ORDER — PHENYLEPHRINE 40 MCG/ML (10ML) SYRINGE FOR IV PUSH (FOR BLOOD PRESSURE SUPPORT)
PREFILLED_SYRINGE | INTRAVENOUS | Status: DC | PRN
  Administered 2020-05-07 (×2): 80 ug via INTRAVENOUS
  Administered 2020-05-07: 120 ug via INTRAVENOUS
  Administered 2020-05-07 (×2): 80 ug via INTRAVENOUS
  Administered 2020-05-07: 120 ug via INTRAVENOUS

## 2020-05-07 MED ORDER — WARFARIN SODIUM 7.5 MG PO TABS
7.5000 mg | ORAL_TABLET | Freq: Once | ORAL | Status: AC
  Administered 2020-05-07: 7.5 mg via ORAL
  Filled 2020-05-07: qty 1

## 2020-05-07 MED ORDER — PANTOPRAZOLE SODIUM 40 MG IV SOLR
40.0000 mg | Freq: Two times a day (BID) | INTRAVENOUS | Status: DC
  Administered 2020-05-08: 40 mg via INTRAVENOUS
  Filled 2020-05-07: qty 40

## 2020-05-07 MED ORDER — SODIUM CHLORIDE 0.9 % IV SOLN: INTRAVENOUS | Status: DC

## 2020-05-07 MED ORDER — LIDOCAINE HCL (CARDIAC) PF 100 MG/5ML IV SOSY
PREFILLED_SYRINGE | INTRAVENOUS | Status: DC | PRN
  Administered 2020-05-07: 100 mg via INTRAVENOUS

## 2020-05-07 MED ORDER — SPIRONOLACTONE 12.5 MG HALF TABLET
12.5000 mg | ORAL_TABLET | Freq: Every day | ORAL | Status: DC
  Administered 2020-05-07 – 2020-05-09 (×3): 12.5 mg via ORAL
  Filled 2020-05-07 (×3): qty 1

## 2020-05-07 MED ORDER — FERROUS SULFATE 325 (65 FE) MG PO TABS
325.0000 mg | ORAL_TABLET | ORAL | Status: DC
  Administered 2020-05-08: 325 mg via ORAL
  Filled 2020-05-07: qty 1

## 2020-05-07 MED ORDER — PROPOFOL 10 MG/ML IV BOLUS
INTRAVENOUS | Status: DC | PRN
  Administered 2020-05-07: 40 mg via INTRAVENOUS

## 2020-05-07 MED ORDER — CHLORHEXIDINE GLUCONATE 0.12% ORAL RINSE (MEDLINE KIT): 15.0000 mL | Freq: Two times a day (BID) | OROMUCOSAL | Status: DC

## 2020-05-07 MED ORDER — WARFARIN - PHARMACIST DOSING INPATIENT: Freq: Every day | Status: DC

## 2020-05-07 MED ORDER — POLYETHYLENE GLYCOL 3350 17 G PO PACK
17.0000 g | PACK | Freq: Every day | ORAL | Status: DC
  Administered 2020-05-09: 17 g via ORAL
  Filled 2020-05-07 (×2): qty 1

## 2020-05-07 MED ORDER — EPHEDRINE SULFATE-NACL 50-0.9 MG/10ML-% IV SOSY
PREFILLED_SYRINGE | INTRAVENOUS | Status: DC | PRN
  Administered 2020-05-07: 10 mg via INTRAVENOUS
  Administered 2020-05-07 (×2): 15 mg via INTRAVENOUS
  Administered 2020-05-07: 10 mg via INTRAVENOUS

## 2020-05-07 SURGICAL SUPPLY — 15 items

## 2020-05-07 NOTE — Interval H&P Note (Signed)
History and Physical Interval Note:  05/07/2020 12:22 PM  [name]  has presented today for surgery, with the diagnosis of duodenal polyp.  The various methods of treatment have been discussed with the patient and family. After consideration of risks, benefits and other options for treatment, the patient has consented to  Procedure(s): ESOPHAGOGASTRODUODENOSCOPY (EGD) WITH PROPOFOL (N/A) as a surgical intervention.  The patient's history has been reviewed, patient examined, no change in status, stable for surgery.  I have reviewed the patient's chart and labs.  Questions were answered to the patient's satisfaction.     [place]

## 2020-05-07 NOTE — Progress Notes (Signed)
Unsuccessful in obtaining arterial sample for lab analysis. Call placed to [facility]. Awaiting further orders/direction.

## 2020-05-07 NOTE — Anesthesia Preprocedure Evaluation (Signed)
Anesthesia Evaluation  Patient identified by MRN, date of birth, ID band Patient awake    Reviewed: Allergy & Precautions, NPO status , Patient's Chart, lab work & pertinent test results  History of Anesthesia Complications Negative for: history of anesthetic complications  Airway Mallampati: II  TM Distance: >3 FB Neck ROM: Full    Dental   Pulmonary former smoker,  Pulmonary HTN   Pulmonary exam normal        Cardiovascular hypertension, +CHF (RV failure/pulm HTN)  Normal cardiovascular exam+ dysrhythmias Atrial Fibrillation + Valvular Problems/Murmurs (s/p redo mitral valve surgery)      Neuro/Psych negative neurological ROS  negative psych ROS   GI/Hepatic (+) Cirrhosis       , Hepatitis -, CDuodenal polyp   Endo/Other  negative endocrine ROS  Renal/GU negative Renal ROS  negative genitourinary   Musculoskeletal negative musculoskeletal ROS (+)   Abdominal   Peds  Hematology negative hematology ROS (+)   Anesthesia Other Findings  Echo 2020: Normal LV systolic function; moderate LVH; mildly dilated aortic root  (4.4 cm); mild AI; s/p MVR with mean gradient of 5 mmHg; severe LAE;  severe RV dysfunction; mild TR; estimated RVSP moderately elevated.   Right heart cath 2021: Moderate mixed PH with moderately depressed cardiac output  Reproductive/Obstetrics                          Anesthesia Physical Anesthesia Plan  ASA: III  Anesthesia Plan: MAC   Post-op Pain Management:    Induction: Intravenous  PONV Risk Score and Plan: 1 and Propofol infusion, TIVA and Treatment may vary due to age or medical condition  Airway Management Planned: Natural Airway, Nasal Cannula and Simple Face Mask  Additional Equipment: None  Intra-op Plan:   Post-operative Plan:   Informed Consent: I have reviewed the patients History and Physical, chart, labs and discussed the procedure including  the risks, benefits and alternatives for the proposed anesthesia with the patient or authorized representative who has indicated his/her understanding and acceptance.       Plan Discussed with:   Anesthesia Plan Comments:         Anesthesia Quick Evaluation

## 2020-05-07 NOTE — Progress Notes (Addendum)
GI UPDATE  Patient thought to have experienced hypercarbic respiratory failure from anesthesia he received for his EGD this afternoon. Had altered mental status post procedure, was intubated with quick improvement in his mental status. He is now answering questions appropriately. He denies any abdominal pain. He is on BiPAP for now, may be able to wean off later this evening. He will be monitored overnight given this occurrence, hopefully may be able to be discharged tomorrow if he remains stable tonight. Okay to resume coumadin tonight, and can resume lovenox bridge later tomorrow if he continues to do well without any bleeding symptoms. If BiPAP comes off tonight can give clear liquids for now. When he is tolerating PO would give protonix 40mg  twice daily for the next 4 weeks to help heal duodenal ulceration which will occur post polypectomy (in the setting of anticoagulation). I have spoken with the wife at bedside earlier when she arrived and called her again just now, answered her questions. Please call with questions tonight, I will reassess him tomorrow morning. Appreciate care per critical care and anesthesia service.  Economy Cellar, MD [facility] Gastroenterology

## 2020-05-07 NOTE — H&P (Addendum)
NAME:  [name], MRN:  [id number], DOB:  [birth year], LOS: 1 ADMISSION DATE:  05/07/2020, CONSULTATION DATE: 05/07/20 REFERRING MD: Dr. Havery Moros, CHIEF COMPLAINT:  AMS   Brief History:  70 y/o M who presented to [facility] on 2/2 for colonoscopy for removal of duodenal adenoma.  Post procedure was altered, hypotensive and required intubation for airway protection.    History of Present Illness:  70 y/o M who presented to [facility] on 2/2 for colonoscopy for removal of duodenal adenoma (seen on prior EGD).    EDG was performed with note of gastritis, normal esophagus, and a single duodenal polyp which was removed.  Prophylactic clips were placed to prevent bleeding.  The case was performed under local MAC.  Per report, he had transient hypotension which was treated with neosynephrine. In addition to hypotension, he had transient ST-T wave abnormalities which returned to baseline. EKG without ST elevation (SR with PVC's).  Post procedure in recovery he was altered and required intubation for airway protection.  The patient was transferred to ICU for admission per anesthesia.  On arrival to ICU, the patient was awake, following commands and moving all extremities.  He was placed on a spontaneous breathing trial and was successfully extubated to BiPAP support. Blood gas drawn on arrival to ICU is suspected to be venous (pH 7.160 / pCO2 68 / pO2 35 / bicarbonate 23) but consistent with hypercarbic respiratory failure.  Labs pending. Prior chart review shows elevated sr. Cr.   PCCM consulted for ICU admission.   Past Medical History:  Hepatitis C - unclear if treated Former Cigar Smoker  Rheumatic Heart Disease  Mitral Valve Repair - on chronic coumadin / lovenox bridge for procedure  AF Aneurysm of Ascending Aorta  Colon Polyps  BPH  Significant Hospital Events:  2/02 Admit post colonoscopy with AMS in setting of hypercarbia   Consults:    Procedures:  ETT 2/2 >> 2/2   Significant Diagnostic  Tests:    Micro Data:  COVID 1/31 >> negative   Antimicrobials:     Interim History / Subjective:  To ICU on vent accompanied by anesthesia  Pt awake on arrival / MAE / following commands Afebrile / VSS   Objective   Blood pressure 95/60, pulse 60, temperature 98.3 F (36.8 C), temperature source Oral, resp. rate 14, height _0  (1.854 m), weight 60.8 kg, SpO2 100 %.    Vent Mode: PCV FiO2 (%):  [35 %-100 %] 35 % Set Rate:  [15 bmp-18 bmp] 15 bmp Vt Set:  [640 mL] 640 mL PEEP:  [5 cmH20] 5 cmH20 Pressure Support:  [10 cmH20] 10 cmH20   Intake/Output Summary (Last 24 hours) at 05/07/2020 1606 Last data filed at 05/07/2020 1314 Gross per 24 hour  Intake 500 ml  Output --  Net 500 ml   Filed Weights   05/07/20 1122 05/07/20 1500  Weight: 63.5 kg 60.8 kg    Examination: General: thin adult male lying in bed on vent, awake / trying to sit up   HEENT: MM pink/moist, ETT, anicteric Neuro: awakens to voice, moving all extremities, follows commands  CV: s1s2 RRR, mechanical valve sounds noted, no m/r/g PULM: non-labored on vent, lungs bilaterally clear GI: soft, bsx4 active  Extremities: warm/dry, no edema  Skin: no rashes or lesions  PCXR 2/2 >> images personally reviewed, ETT in good position, mild vascular congestion  Resolved Hospital Problem list     Assessment & Plan:   Acute Hypercarbic Respiratory Failure In  setting of sedation for procedure, hx tobacco abuse (no PFT's on file, CXR with hyperinfilation), has elevated pulmonary pressures on prior ECHO (?if this is related to MV disease / L heart disease) -extubated to BiPAP -continue BiPAP support now and QHS PRN  -wean O2 for sats 88-95% -aspiration precautions -reassess ABG at 1630 to ensure clearance of CO2 -? If he has sleep apnea as noted periods of apnea, consider outpatient sleep study  Rheumatic Heart Disease s/p Mechanical Valve on Anticoagulation  HTN AF Aneurysm of Ascending Aorta  Last RHC  10/2019 per Dr. 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  He was to have lovenox / coumadin bridge with follow up on 2/8 for INR -will review in am 2/3 for decision regarding lovenox, discussed with pharmacy  -follow daily INR  -cardiology consulted at family request  -tele monitoring  -resume home lasix, aldactone in am  -follow I/O's -follow up labs in am  ? CKD  Prior chart review shows sr cr 2.41  -Trend BMP / urinary output -Replace electrolytes as indicated -Avoid nephrotoxic agents, ensure adequate renal perfusion  Colon Adenoma  -per GI  -empiric clips placed to prevent bleeding during case  -continue PPI  -follow up pathology post biopsy   Moderate Malnutrition  BMI 17  -ensure BID   BPH  -not on meds per home review  Best practice (evaluated daily)  Diet: clear liquid and advance to regular diet as tolerated  Pain/Anxiety/Delirium protocol (if indicated): n/a  VAP protocol (if indicated): n/a  DVT prophylaxis: coumadin GI prophylaxis: protonix  Glucose control: n/a  Mobility: progress as tolerated  Disposition: ICU observation   Family: Wife updated in detail at bedside on plan of care.   Goals of Care:  Last date of multidisciplinary goals of care discussion: n/a  Family and staff present:  Summary of discussion:  Follow up goals of care discussion due:  Code Status: Full Code   Labs   CBC: No results for input(s): WBC, NEUTROABS, HGB, HCT, MCV, PLT in the last 168 hours.  Basic Metabolic Panel: No results for input(s): NA, K, CL, CO2, GLUCOSE, BUN, CREATININE, CALCIUM, MG, PHOS in the last 168 hours. GFR: CrCl cannot be calculated (Patient's most recent lab result is  older than the maximum 21 days allowed.). No results for input(s): PROCALCITON, WBC, LATICACIDVEN in the last 168 hours.  Liver Function Tests: No results for input(s): AST, ALT, ALKPHOS, BILITOT, PROT, ALBUMIN in the last 168 hours. No results for input(s): LIPASE, AMYLASE in the last 168 hours. No results for input(s): AMMONIA in the last 168 hours.  ABG    Component Value Date/Time   PHART 7.160 (LL) 05/07/2020 1428   PCO2ART 68.8 (HH) 05/07/2020 1428   PO2ART 35.6 (LL) 05/07/2020 1428   HCO3 23.5 05/07/2020 1428   TCO2 25 11/26/2019 1327   ACIDBASEDEF 5.4 (H) 05/07/2020 1428   O2SAT 44.8 05/07/2020 1428     Coagulation Profile: No results for input(s): INR, PROTIME in the last 168 hours.  Cardiac Enzymes: No results for input(s): CKTOTAL, CKMB, CKMBINDEX, TROPONINI in the last 168 hours.  HbA1C: No results found for: HGBA1C  CBG: Recent Labs  Lab 05/07/20 1343  GLUCAP 140*    Review of Systems:   Unable to complete as patient is altered on mechanical ventilation.   Past Medical History:  He,  has a past medical history of Allergy, Aneurysm of ascending aorta ([place]), Atrial fibrillation ([place]), BPH (benign prostatic hyperplasia), Colon polyps, FHx: rheumatic heart disease, H/O diplopia, Hepatitis C, Hypertension, Hypogonadism male, and Mitral valve disease.   Surgical History:   Past Surgical History:  Procedure Laterality Date  . BIOPSY  11/19/2019   Procedure: BIOPSY;  Surgeon: Yetta Flock, MD;  Location: [facility];  Service: Gastroenterology;;  EGD and COLON  . COLON SURGERY    . COLONOSCOPY    . COLONOSCOPY N/A 11/14/2012   Procedure: COLONOSCOPY;  Surgeon: Lear Ng, MD;  Location: [facility];  Service: Endoscopy;  Laterality: N/A;  . COLONOSCOPY WITH PROPOFOL N/A 11/19/2019   Procedure: COLONOSCOPY WITH PROPOFOL;  Surgeon: Yetta Flock, MD;  [facility];  Service: Gastroenterology;  Laterality: N/A;  . COLONOSCOPY  WITH PROPOFOL N/A 11/27/2019   Procedure: COLONOSCOPY WITH PROPOFOL;  Surgeon: Doran Stabler, MD;  [facility];  Service: Gastroenterology;  Laterality: N/A;  . ESOPHAGOGASTRODUODENOSCOPY (EGD) WITH PROPOFOL N/A 05/08/2014   Procedure: ESOPHAGOGASTRODUODENOSCOPY (EGD) WITH PROPOFOL;  Surgeon: Lear Ng, MD;  Location: [place];  Service: Endoscopy;  Laterality: N/A;  . ESOPHAGOGASTRODUODENOSCOPY (EGD) WITH PROPOFOL N/A 11/19/2019   Procedure: ESOPHAGOGASTRODUODENOSCOPY (EGD) WITH PROPOFOL;  Surgeon: Yetta Flock, MD;  [facility];  Service: Gastroenterology;  Laterality: N/A;  . HEMOSTASIS CLIP PLACEMENT  11/19/2019   Procedure: HEMOSTASIS CLIP PLACEMENT;  Surgeon: Yetta Flock, MD;  [facility];  Service: Gastroenterology;;  . HEMOSTASIS CLIP PLACEMENT  11/27/2019   Procedure: HEMOSTASIS CLIP PLACEMENT;  Surgeon: Doran Stabler, MD;  [facility];  Service: Gastroenterology;;  . MITRAL VALVE REPLACEMENT     1973 (bioprosthesis), 1988 (St Jude mechanical vlave) , 2006 (repair due to leak)  . POLYPECTOMY  11/19/2019   Procedure: POLYPECTOMY;  Surgeon: Yetta Flock, MD;  Location: [facility];  Service: Gastroenterology;;  . RIGHT HEART CATH N/A 11/24/2018   Procedure: RIGHT HEART CATH;  Surgeon: Jolaine Artist, MD;  Location: [place] [facility];  Service: Cardiovascular;  Laterality: N/A;  . RIGHT HEART CATH N/A 10/23/2019   Procedure: RIGHT HEART CATH;  Surgeon: Jolaine Artist, MD;  Location: [place] [facility];  Service: Cardiovascular;  Laterality: N/A;  . UPPER GASTROINTESTINAL ENDOSCOPY     02-2010 and 05-2014     Social History:   reports that he quit smoking about 2 years ago. His smoking use included cigars. He has never used smokeless tobacco. He reports that he does not drink alcohol and does not use drugs.   Family History:  His Family history is unknown by patient.   Allergies Allergies   Allergen Reactions  . Penicillins Rash    Did it involve swelling of the face/tongue/throat, SOB, or low BP? No Did it involve sudden or severe rash/hives, skin peeling, or any reaction on the inside of your mouth or nose? No Did you need to seek medical attention at a hospital or doctor's office? No When did it last happen?30 + years If all above answers are "NO", may proceed with cephalosporin use.      Home Medications  Prior to Admission medications   Medication Sig Start Date End Date Taking? Authorizing Provider  enoxaparin (LOVENOX) 60 MG/0.6ML injection Inject 0.6 mLs (60 mg total)  into the skin daily. 04/24/20  Yes Belva Crome, MD  ferrous sulfate 325 (65 FE) MG tablet Take 1 tablet (325 mg total) by mouth every other day. In the evening. Patient taking differently: Take 325 mg by mouth every other day. 12/02/19  Yes Nita Sells, MD  furosemide (LASIX) 40 MG tablet TAKE 1 TABLET(40 MG) BY MOUTH DAILY Patient taking differently: Take 40 mg by mouth daily. 03/17/20  Yes Bensimhon, Shaune Pascal, MD  metolazone (ZAROXOLYN) 2.5 MG tablet Take as directed by the heart failure clinic Patient taking differently: Take 2.5 mg by mouth daily as needed (Swelling). Take as directed by the heart failure clinic 03/25/20  Yes Bensimhon, Shaune Pascal, MD  polyethylene glycol (MIRALAX) 17 g packet Take 17 g by mouth daily as needed. Patient taking differently: Take 17 g by mouth daily. 08/28/19  Yes Armbruster, Carlota Raspberry, MD  potassium chloride SA (KLOR-CON) 20 MEQ tablet Take 1 tablet (20 mEq total) by mouth daily. 12/11/19  Yes Bensimhon, Shaune Pascal, MD  sildenafil (REVATIO) 20 MG tablet Take 20 mg by mouth daily as needed (ED). 11/13/19  Yes [provider]  spironolactone (ALDACTONE) 25 MG tablet TAKE 1/2 TABLET(12.5 MG) BY MOUTH DAILY Patient taking differently: Take 12.5 mg by mouth daily. 02/18/20  Yes Bensimhon, Shaune Pascal, MD  Testosterone 1.62 % GEL Apply 2 Pump topically daily.  1 pump on each arm 03/05/20  Yes [provider]  warfarin (COUMADIN) 5 MG tablet TAKE 1/2 TABLET BY MOUTH ON THURSDAY AND SATURDAY. THEN TAKE 1 TABLET BY MOUTH ON SUNDAY, [place], [place], [place], [place]. Patient taking differently: Take 2.5-5 mg by mouth See admin instructions. Take 5 mg every day EXCEPT: Sunday take 2.5 mg 02/27/20  Yes Anderson, Chelsey L, DO  amoxicillin (AMOXIL) 500 MG capsule TAKE 4 CAPSULES BY MOUTH 1 HOUR BEFORE DENTAL APPOINTMENT Patient taking differently: Take 2,000 mg by mouth once. 1 HOUR BEFORE DENTAL APPOINTMENT 03/06/20   Belva Crome, MD     Critical care time: 54 minutes     Noe Gens, MSN, APRN, NP-C, AGACNP-BC  Pulmonary & Critical Care 05/07/2020, 4:06 PM   Please see [email or web address] for pager details.   From 7A-7P if no response, please call [phone] After hours, please call ELink [phone]

## 2020-05-07 NOTE — Progress Notes (Signed)
CRITICAL VALUE ALERT  Critical Value: PO2 <31  Date & Time Notied:  05/07/20 5241  Provider Notified: Carlis Abbott, DO  Orders Received/Actions taken: No new orders at this time

## 2020-05-07 NOTE — H&P (Signed)
HPI :  70 year old male with a history of A. fib, history of mitral repair on chronic Coumadin, history of pulmonary hypertension with severe RV reduced cardiac output, history of HCV cirrhosis and colon polyps, here at the hospital for removal of duodenal adenoma noted on prior EGD.   In regards to his cirrhosis  has been compensated.  He had an EGD with me in August which showed no esophageal or gastric varices.  Recall that he was previously diagnosed with cirrhosis related to chronic hep C at [facility], previously was treated with Harvoni and eradicated.    Incidentally as part of his EGD he was found to have a polypoid lesion in the duodenal bulb, was not convinced it was a true adenoma biopsy was obtained, unfortunately this returned as adenomatous.  We discussed what this was and recommendations to have it removed.  He continues to take Coumadin daily and require Lovenox bridge for his prior EGD and colonoscopy.  He also had a colonoscopy with me in August.  3 small polyps removed, one via hot snare in the left colon.  Given his need for Lovenox bridge and resumption of anticoagulation hemostasis clip was placed along the polyp that was removed via hot snare.  Despite doing this he had bleeding about a week after the exam requiring hospitalization placement of hemoclips at the site of the polypectomy.  Polyps were removed returned as adenomatous.  He has not had any bleeding symptoms in the interim and generally doing pretty well.  He denies changes in his medical history since I have last seen him.  Prior work-up: Colonoscopy 11/14/2012 - 2 polyps removed, largest 1.4cm  EGD 05/08/2014 - no varices, antral gastritis  Echocardiogram 10/13/18 - EF 60-65%, severely reduced RV function  Cardiac cath 11/24/18 - moderate pulm HTN, "moderate to severely reduced cardiac outout"  Colonoscopy 11/19/19 - Diverticulosis in the sigmoid colon. - One 4 to 5 mm polyp in the ascending colon, removed  with a cold snare. Resected and retrieved. - One diminutive polyp in the sigmoid colon, removed with a cold biopsy forceps. Resected and retrieved. - One 6 mm polyp in the sigmoid colon, removed with a hot snare. Resected and retrieved. Clip was placed. - The examination was otherwise normal.  EGD 11/19/19 - The examined esophagus was normal. No esophageal varices. Multiple small sessile polyps were found in the gastric antrum. Biopsy was taken with a cold forceps for histology from one of these as a representative sample. The exam of the stomach was otherwise normal. No gastric varices A single 7 to 8 mm sessile polypoid lesion vs. normal variant (brunner's gland hyperplasia) was found in the duodenal bulb. One small biopsy was taken with a cold forceps for histology. The exam of the duodenum was otherwise normal.   Colonoscopy 11/27/19 - Preparation of the colon was poor. - Blood in the sigmoid colon and in the descending colon. - Stool in the entire examined colon. - A single (solitary) ulcer in the recto-sigmoid colon. Clips (MR conditional) were placed on polypectomy site. - The examination was otherwise normal. - No specimens collected.   FINAL MICROSCOPIC DIAGNOSIS:   A. DUODENUM, POLYPECTOMY:  - Duodenal adenoma (low-grade dysplasia)  - Negative for high-grade dysplasia or carcinoma   B. STOMACH, BIOPSY:  - Gastric antral mucosa with mild nonspecific reactive gastropathy  - Warthin Starry stain is negative for Helicobacter pylori   C. COLON, ASCENDING, SIGMOID, POLYPECTOMY:  - Tubular adenoma(s)  - Negative for high-grade dysplasia  Past Medical History:  Diagnosis Date  . Allergy   . Aneurysm of ascending aorta ([facility])   . Atrial fibrillation ([place])   . BPH (benign prostatic hyperplasia)   . Colon polyps   . FHx: rheumatic heart disease   . H/O diplopia   . Hepatitis C   . Hypertension   . Hypogonadism male   . Mitral valve disease    mitral valve  repair/replacement x3     Past Surgical History:  Procedure Laterality Date  . BIOPSY  11/19/2019   Procedure: BIOPSY;  Surgeon: Yetta Flock, MD;  [facility];  Service: Gastroenterology;;  EGD and COLON  . COLON SURGERY    . COLONOSCOPY    . COLONOSCOPY N/A 11/14/2012   Procedure: COLONOSCOPY;  Surgeon: Lear Ng, MD;  [facility];  Service: Endoscopy;  Laterality: N/A;  . COLONOSCOPY WITH PROPOFOL N/A 11/19/2019   Procedure: COLONOSCOPY WITH PROPOFOL;  Surgeon: Yetta Flock, MD;  [facility];  Service: Gastroenterology;  Laterality: N/A;  . COLONOSCOPY WITH PROPOFOL N/A 11/27/2019   Procedure: COLONOSCOPY WITH PROPOFOL;  Surgeon: Doran Stabler, MD;  [facility];  Service: Gastroenterology;  Laterality: N/A;  . ESOPHAGOGASTRODUODENOSCOPY (EGD) WITH PROPOFOL N/A 05/08/2014   Procedure: ESOPHAGOGASTRODUODENOSCOPY (EGD) WITH PROPOFOL;  Surgeon: Lear Ng, MD;  Location: [place];  Service: Endoscopy;  Laterality: N/A;  . ESOPHAGOGASTRODUODENOSCOPY (EGD) WITH PROPOFOL N/A 11/19/2019   Procedure: ESOPHAGOGASTRODUODENOSCOPY (EGD) WITH PROPOFOL;  Surgeon: Yetta Flock, MD;  [facility];  Service: Gastroenterology;  Laterality: N/A;  . HEMOSTASIS CLIP PLACEMENT  11/19/2019   Procedure: HEMOSTASIS CLIP PLACEMENT;  Surgeon: Yetta Flock, MD;  [facility];  Service: Gastroenterology;;  . HEMOSTASIS CLIP PLACEMENT  11/27/2019   Procedure: HEMOSTASIS CLIP PLACEMENT;  Surgeon: Doran Stabler, MD;  [facility];  Service: Gastroenterology;;  . MITRAL VALVE REPLACEMENT     1973 (bioprosthesis), 1988 (St Jude mechanical vlave) , 2006 (repair due to leak)  . POLYPECTOMY  11/19/2019   Procedure: POLYPECTOMY;  Surgeon: Yetta Flock, MD;  Location[facility];  Service: Gastroenterology;;  . RIGHT HEART CATH N/A 11/24/2018   Procedure: RIGHT HEART CATH;  Surgeon:  Jolaine Artist, MD;  Location: [place] [facility];  Service: Cardiovascular;  Laterality: N/A;  . RIGHT HEART CATH N/A 10/23/2019   Procedure: RIGHT HEART CATH;  Surgeon: Jolaine Artist, MD;  Location: [place] [facility];  Service: Cardiovascular;  Laterality: N/A;  . UPPER GASTROINTESTINAL ENDOSCOPY     02-2010 and 05-2014   Family History  Family history unknown: Yes   Social History   Tobacco Use  . Smoking status: Former Smoker    Types: Cigars    Quit date: 09/03/2017    Years since quitting: 2.6  . Smokeless tobacco: Never Used  Vaping Use  . Vaping Use: Never used  Substance Use Topics  . Alcohol use: No  . Drug use: No   No current facility-administered medications for this encounter.   Allergies  Allergen Reactions  . Penicillins Rash    Did it involve swelling of the face/tongue/throat, SOB, or low BP? No Did it involve sudden or severe rash/hives, skin peeling, or any reaction on the inside of your mouth or nose? No Did you need to seek medical attention at a hospital or doctor's office? No When did it last happen?30 + years If all above answers are "NO", may proceed with cephalosporin use.  Review of Systems: All systems reviewed and negative except where noted in HPI.   Lab Results  Component Value Date   WBC 4.4 12/06/2019   HGB 8.8 (L) 12/06/2019   HCT 29.5 (L) 12/06/2019   MCV 99.0 12/06/2019   PLT 124 (L) 12/06/2019    Lab Results  Component Value Date   CREATININE 2.41 (H) 12/20/2019   BUN 30 (H) 12/20/2019   NA 138 12/20/2019   K 3.8 12/20/2019   CL 107 12/20/2019   CO2 22 12/20/2019    Lab Results  Component Value Date   ALT 35 11/27/2019   AST 109 (H) 11/27/2019   ALKPHOS 35 (L) 11/27/2019   BILITOT 2.0 (H) 11/27/2019     Physical Exam: BP (!) 112/55   Pulse 64   Temp 98.3 F (36.8 C) (Oral)   Resp 16   Ht 6\' 1"  (1.854 m)   Wt 63.5 kg   SpO2 98%   BMI 18.47 kg/m  Constitutional:  Pleasant,well-developed, male in no acute distress. Cardiovascular: Normal rate, regular rhythm.  Pulmonary/chest: Effort normal and breath sounds normal. . Abdominal: Soft, nondistended, nontender.  Extremities: no edema Lymphadenopathy: No cervical adenopathy noted. Neurological: Alert and oriented to person place and time. Skin: Skin is warm and dry. No rashes noted. Psychiatric: Normal mood and affect. Behavior is normal.   ASSESSMENT AND PLAN: 70 y/o male history of AF and mitral valve repair on chronic coumadin, here at the hospital for duodenal adenoma removal. Lesion noted incidentally on last EGD, was not convinced it was a polyp and path returned adenomatous. I have discussed risks / benefits of EGD, duodenal adenoma polypectomy, and anesthesia with him today, increased risk for bleeding given his anticoagulation use. He wishes to proceed. He had post polypectomy bleeding from his colon when removing polyp via hot snare, will take precautions needed to reduce risks for bleeding. We may attempt removal of this polyp via cold snare if amenable given recent data on this technique for duodenal adenomas which has much less bleeding risk than hot snare. He has been off coumadin for 5 days, plan to bridge with lovenox post procedure. Further recommendations pending results. All questions answered.   [place] Cellar, MD [facility] Gastroenterology

## 2020-05-07 NOTE — Plan of Care (Signed)
Wife updated again at bedside, verified not taking sildenafil or rate control meds for Afib. All questions answered.  Julian Hy, DO 05/07/20 5:56 PM [place] [facility]

## 2020-05-07 NOTE — Progress Notes (Signed)
To recovery area of endoscopy unit s/p EGD. Pt unresponsive, EKG changes noted, shallow resps, desaturation to 87%, jaw thrust required to increase sat to 94% w/15L via mask.  1336: Stephanie CRNA at bedside to assess 1337: Dr Havery Moros to bedside, 12 lead EKG ordered 1340: 12 lead EKG obtained, order placed for STAT pCXR per Dr Havery Moros, Dr Kerin Perna to bedside 1350: pCXR obtained, respirations assisted with bag/mask ventilation by CRNA 1356: pt intubated by [facility] CRNA, Dr Kerin Perna and Dr Havery Moros at bedside. ICU RNs at bedside 1402: pt transported to ICU

## 2020-05-07 NOTE — Progress Notes (Signed)
[facility] Progress Note Patient Name: [name] DOB: 05/06/1950 MRN: [id number]   Date of Service  05/07/2020  HPI/Events of Note  Patient needs a gas mainly to check pH and ventilation on BIPAP, his saturation has been 100 % and RT is unable to stick the radial  artery successfully.  eICU Interventions  RT authorized to get a VBG for pH and pCO2.        Frederik Pear 05/07/2020, 9:23 PM

## 2020-05-07 NOTE — Progress Notes (Signed)
ANTICOAGULATION CONSULT NOTE - Initial Consult  Pharmacy Consult for warfarin Indication: Mechanical Valve Replacement  Allergies  Allergen Reactions  . Penicillins Rash    Did it involve swelling of the face/tongue/throat, SOB, or low BP? No Did it involve sudden or severe rash/hives, skin peeling, or any reaction on the inside of your mouth or nose? No Did you need to seek medical attention at a hospital or doctor's office? No When did it last happen?30 + years If all above answers are "NO", may proceed with cephalosporin use.     Patient Measurements: Height: '6\' 1"'  (185.4 cm) Weight: 60.8 kg (134 lb 0.6 oz) IBW/kg (Calculated) : 79.9 Heparin Dosing Weight:   Vital Signs: Temp: 98.3 F (36.8 C) (02/02 1122) Temp Source: Oral (02/02 1122) BP: 106/56 (02/02 1700) Pulse Rate: 75 (02/02 1700)  Labs: Recent Labs    05/07/20 1700  LABPROT 18.0*  INR 1.6*    CrCl cannot be calculated (Patient's most recent lab result is older than the maximum 21 days allowed.).   Medical History: Past Medical History:  Diagnosis Date  . Allergy   . Aneurysm of ascending aorta (HCC)   . Atrial fibrillation ([place])   . BPH (benign prostatic hyperplasia)   . Colon polyps   . FHx: rheumatic heart disease   . H/O diplopia   . Hepatitis C   . Hypertension   . Hypogonadism male   . Mitral valve disease    mitral valve repair/replacement x3    Medications:  Scheduled:  . chlorhexidine gluconate (MEDLINE KIT)  15 mL Mouth Rinse BID  . Chlorhexidine Gluconate Cloth  6 each Topical Daily  . [START ON 05/08/2020] feeding supplement  237 mL Oral BID BM  . [START ON 05/08/2020] ferrous sulfate  325 mg Oral QODAY  . [START ON 05/08/2020] furosemide  40 mg Oral Daily  . mouth rinse  15 mL Mouth Rinse 10 times per day  . [START ON 05/08/2020] pantoprazole (PROTONIX) IV  40 mg Intravenous Q12H  . polyethylene glycol  17 g Oral Daily  . [START ON 05/08/2020] potassium chloride SA  20 mEq Oral  Daily  . spironolactone  12.5 mg Oral Daily    Assessment: Pharmacy is consulted to dose warfarin in 70 yo pt with hx of mechanical mitral valve. Pt admitted to ICU after GI procedure. PTA warfarin dose is 5 mg daily except 2.5 mg on Sundays.   Spoke with Oralia Manis NP, will evaluate start of enoxaparin on 2/3 pending assessment of pt in AM and if pt having any bleeding issues.   Pt with a visit to clinic with the following instructions:       2/1:No Lovenox and No Warfarin.  2/2: Procedure Day-No enoxaparin - Resume warfarin in the evening or as directed by doctor (take an extra half tablet with usual dose for 2 days then resume normal dose).  2/3: Resume enoxaparin inject in the fattytissue at8amand take warfarin(take an extra half tablet with usual dose for 2 days then resume normal dose).  2/4: Inject enoxaparin in the fatty tissueat 8amand take warfarin  2/5: Inject enoxaparin in the fatty tissueat 8amand take warfarin  2/6: Inject enoxaparin in the fatty tissueat 8am andtake warfarin  2/7: Inject enoxaparin in the fatty tissueat 8am and take warfarin  2/8: Inject enoxaparin in the fatty tissueat 8am and report forINR check.    Goal of Therapy:  INR 2.5 to 3.5  Monitor platelets by anticoagulation protocol: Yes   Plan:  Warfarin 7.5 mg PO x 1   Daily INR   Monitor for signs and symptoms of bleeding   Royetta Asal, PharmD, BCPS 05/07/2020 6:12 PM ]

## 2020-05-07 NOTE — Anesthesia Postprocedure Evaluation (Signed)
Anesthesia Post Note  Patient: [name]  Procedure(s) Performed: ESOPHAGOGASTRODUODENOSCOPY (EGD) WITH PROPOFOL (N/A ) POLYPECTOMY [place]     Patient location during evaluation: SICU Anesthesia Type: MAC Level of consciousness: sedated (sedated but arousable) Pain management: pain level controlled Vital Signs Assessment: post-procedure vital signs reviewed and stable Respiratory status: spontaneous breathing (BiPAP) Cardiovascular status: blood pressure returned to baseline and stable Anesthetic complications: yes Comments: Pt still sleepy but arousable, breathing well on BiPAP after extubation by ICU team.   Encounter Complications  Complication Outcome Phase Comment  Unplanned ventilation Resolved in Lab In Recovery Patient required intubation in recovery due to hypoventilation/hypercarbia. Resolved rapidly with mechanical ventilation.    Last Vitals:  Vitals:   05/07/20 1405 05/07/20 1500  BP: 122/68 (!) 97/59  Pulse: 90 70  Resp: 20 14  Temp:    SpO2: 100% 100%    Last Pain:  Vitals:   05/07/20 1500  TempSrc:   PainSc: 0-No pain                 Lidia Collum

## 2020-05-07 NOTE — Transfer of Care (Signed)
Immediate Anesthesia Transfer of Care Note  Patient: ESAIAH WANLESS  Procedure(s) Performed: ESOPHAGOGASTRODUODENOSCOPY (EGD) WITH PROPOFOL (N/A ) POLYPECTOMY SUBMUCOSAL LIFTING INJECTION HEMOSTASIS CLIP PLACEMENT  Patient Location: PACU and ICU  Anesthesia Type:MAC  Level of Consciousness: drowsy  Airway & Oxygen Therapy: Patient Spontanous Breathing and Patient connected to face mask oxygen  Post-op Assessment: Report given to RN and Post -op Vital signs reviewed and stable  Post vital signs: Reviewed and stable  Last Vitals:  Vitals Value Taken Time  BP 122/68 05/07/20 1405  Temp    Pulse 89 05/07/20 1408  Resp 10 05/07/20 1431  SpO2 100 % 05/07/20 1408  Vitals shown include unvalidated device data.  Last Pain:  Vitals:   05/07/20 1122  TempSrc: Oral  PainSc: 0-No pain         Complications: No complications documented.

## 2020-05-07 NOTE — Anesthesia Procedure Notes (Signed)
Procedure Name: Intubation Date/Time: 05/07/2020 2:45 PM Performed by: [place] Miner Koral C, CRNA Pre-anesthesia Checklist: Patient identified, Emergency Drugs available, Suction available and Patient being monitored Patient Re-evaluated:Patient Re-evaluated prior to induction Oxygen Delivery Method: Ambu bag Preoxygenation: Pre-oxygenation with 100% oxygen Induction Type: IV induction Ventilation: Mask ventilation without difficulty Laryngoscope Size: Mac and 4 Grade View: Grade I Tube type: Oral Tube size: 7.5 mm Number of attempts: 1 Airway Equipment and Method: Stylet Placement Confirmation: ETT inserted through vocal cords under direct vision,  positive ETCO2 and breath sounds checked- equal and bilateral Secured at: 22 cm Tube secured with: Tape Dental Injury: Teeth and Oropharynx as per pre-operative assessment

## 2020-05-07 NOTE — Op Note (Addendum)
[facility] Patient Name: [name] Procedure Date: 05/07/2020 MRN: [id number] Attending MD: Carlota Raspberry. 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 Havery Moros, MD, Cleda Daub, RN,                            Tyrone Apple, Technician, Stephanie [place]                            CRNA Referring MD:              Medicines:                Monitored Anesthesia Care Complications:            Estimated blood loss: Minimal. See below for                            details. Estimated Blood Loss:     Estimated blood loss was minimal. Procedure:                Pre-Anesthesia Assessment:                           - Prior to the procedure, a History and Physical                            was performed, and patient medications and                            allergies were reviewed. The patient's tolerance of                            previous anesthesia was also reviewed. The risks                            and benefits of the procedure and the sedation                            options and risks were discussed with the patient.                            All questions were answered, and informed consent                            was obtained. Prior Anticoagulants: The patient has                            taken Coumadin (warfarin), last dose  was 5 days                            prior to procedure. ASA Grade Assessment: III - A                            patient with severe systemic disease. After                            reviewing the risks and benefits, the patient was                            deemed in satisfactory condition to undergo the                            procedure.                           After obtaining  informed consent, the endoscope was                            passed under direct vision. Throughout the                            procedure, the patient's blood pressure, pulse, and                            oxygen saturations were monitored continuously. The                            GIF-H190 ([id number]) Olympus gastroscope was                            introduced through the mouth, and advanced to the                            second part of duodenum. The upper GI endoscopy was                            accomplished without difficulty. The patient                            tolerated the procedure well. Scope In: Scope Out: Findings:      The examined esophagus was normal. Upon removal of pediatric       colonoscope, there was a small mucosal wrent noted at the GEJ. This was       observed for a few minutes and no bleeding noted, no intervention       performed. No varices.      Diffuse mild inflammation characterized by patchy erythema and       friability was found in the entire examined stomach.      The exam of the stomach was otherwise normal. No varices.      A single 8 mm flat polyp was found in the duodenal bulb. Initially a  pass made with captivator snare to remove cold / en bloc clot given it       was flat and to reduce risk of bleeding but it did not cut through       initially so decision was made to lift it prior to further attempts.       Regular endoscope was switched to pediatric colonoscope to get better       positioning (due to looping in the stomach with regular endoscope). Area       was then successfully injected with 3 mL Orise gel for a lift       polypectomy and lifted appropriately. The polyp was removed with a cold       snare. Resection and retrieval were complete. To prevent bleeding after       the polypectomy, four hemostatic clips were successfully placed across       the defect. There was no bleeding at the end of the procedure.      The  exam of the duodenum was otherwise normal. Impression:               - Normal esophagus. Small mucosal wrent noted at                            GEJ likely due to looping in the stomach from                            endoscopy, observed without bleeding.                           - Gastritis.                           - Normal stomach otherwise                           - A single duodenal polyp. Removed as outlined                            above. Clips were placed prophylactically                           The patient had some transient hypotension during                            the latter portion of the case which was treated                            with anesthesia. He also had transient ST - T wave                            morphology changes on cardiac monitor which                            returned to previous baseline. In the recovery area                            the blood  pressure and HR appeared okay however the                            patient did not wake up from anesthesia after an                            appropriate amount of recovery time. EKG did not                            show any acute ST segment changes. Mental status                            did not improve so to protect his airway, was                            intubated per anesthesia. Critical care team called                            for further evaluation. Unclear what has                            precipitated acute mental status change, need to                            rule out acute CVA, metabolic disturbance, cardiac                            event, etc. Patient transferred to ICU for further                            evaluation. I spoke with the critical care                            consultant. Moderate Sedation:      No moderate sedation, case performed with MAC Recommendation:           - Intubated per anesthesia as above to protect                            airway in  light of mental status changes,                            transferred to ICU for further evaluation                           - I will update family and follow with you                           - He should be on protonix prophylactically post                            polypectomy                           -  Await pathology results.                           - Recommendations regarding resumption of                            anticoagulation pending patient's course Procedure Code(s):        --- Professional ---                           [id number], Esophagogastroduodenoscopy, flexible,                            transoral; with removal of tumor(s), polyp(s), or                            other lesion(s) by snare technique                           43236, Esophagogastroduodenoscopy, flexible,                            transoral; with directed submucosal injection(s),                            any substance Diagnosis Code(s):        --- Professional ---                           K29.70, Gastritis, unspecified, without bleeding                           K31.7, Polyp of stomach and duodenum CPT copyright 2019 American [employer]. All rights reserved. The codes documented in this report are preliminary and upon coder review may  be revised to meet current compliance requirements. Remo Lipps P. Audrena Talaga, MD 05/07/2020 2:18:08 PM This report has been signed electronically. Number of Addenda: 0

## 2020-05-08 ENCOUNTER — Encounter (HOSPITAL_COMMUNITY): Payer: Self-pay | Admitting: Gastroenterology

## 2020-05-08 DIAGNOSIS — K7460 Unspecified cirrhosis of liver: Secondary | ICD-10-CM | POA: Diagnosis present

## 2020-05-08 DIAGNOSIS — Z7901 Long term (current) use of anticoagulants: Secondary | ICD-10-CM | POA: Diagnosis not present

## 2020-05-08 DIAGNOSIS — Z952 Presence of prosthetic heart valve: Secondary | ICD-10-CM | POA: Diagnosis not present

## 2020-05-08 DIAGNOSIS — I4819 Other persistent atrial fibrillation: Secondary | ICD-10-CM | POA: Diagnosis not present

## 2020-05-08 DIAGNOSIS — E440 Moderate protein-calorie malnutrition: Secondary | ICD-10-CM | POA: Diagnosis present

## 2020-05-08 DIAGNOSIS — I482 Chronic atrial fibrillation, unspecified: Secondary | ICD-10-CM | POA: Diagnosis not present

## 2020-05-08 DIAGNOSIS — N184 Chronic kidney disease, stage 4 (severe): Secondary | ICD-10-CM | POA: Diagnosis not present

## 2020-05-08 DIAGNOSIS — I2729 Other secondary pulmonary hypertension: Secondary | ICD-10-CM | POA: Diagnosis present

## 2020-05-08 DIAGNOSIS — I5032 Chronic diastolic (congestive) heart failure: Secondary | ICD-10-CM | POA: Diagnosis not present

## 2020-05-08 DIAGNOSIS — Y838 Other surgical procedures as the cause of abnormal reaction of the patient, or of later complication, without mention of misadventure at the time of the procedure: Secondary | ICD-10-CM | POA: Diagnosis not present

## 2020-05-08 DIAGNOSIS — Y733 Surgical instruments, materials and gastroenterology and urology devices (including sutures) associated with adverse incidents: Secondary | ICD-10-CM | POA: Diagnosis not present

## 2020-05-08 DIAGNOSIS — K2970 Gastritis, unspecified, without bleeding: Secondary | ICD-10-CM | POA: Diagnosis not present

## 2020-05-08 DIAGNOSIS — J9601 Acute respiratory failure with hypoxia: Secondary | ICD-10-CM | POA: Diagnosis not present

## 2020-05-08 DIAGNOSIS — I2722 Pulmonary hypertension due to left heart disease: Secondary | ICD-10-CM | POA: Diagnosis present

## 2020-05-08 DIAGNOSIS — Z880 Allergy status to penicillin: Secondary | ICD-10-CM | POA: Diagnosis not present

## 2020-05-08 DIAGNOSIS — J9602 Acute respiratory failure with hypercapnia: Secondary | ICD-10-CM | POA: Diagnosis not present

## 2020-05-08 DIAGNOSIS — Z8619 Personal history of other infectious and parasitic diseases: Secondary | ICD-10-CM | POA: Diagnosis not present

## 2020-05-08 DIAGNOSIS — D649 Anemia, unspecified: Secondary | ICD-10-CM | POA: Diagnosis present

## 2020-05-08 DIAGNOSIS — J95821 Acute postprocedural respiratory failure: Secondary | ICD-10-CM | POA: Diagnosis not present

## 2020-05-08 DIAGNOSIS — K317 Polyp of stomach and duodenum: Secondary | ICD-10-CM | POA: Diagnosis present

## 2020-05-08 DIAGNOSIS — N400 Enlarged prostate without lower urinary tract symptoms: Secondary | ICD-10-CM | POA: Diagnosis present

## 2020-05-08 DIAGNOSIS — Z681 Body mass index (BMI) 19 or less, adult: Secondary | ICD-10-CM | POA: Diagnosis not present

## 2020-05-08 DIAGNOSIS — Z20822 Contact with and (suspected) exposure to covid-19: Secondary | ICD-10-CM | POA: Diagnosis not present

## 2020-05-08 DIAGNOSIS — G9341 Metabolic encephalopathy: Secondary | ICD-10-CM | POA: Diagnosis not present

## 2020-05-08 DIAGNOSIS — I712 Thoracic aortic aneurysm, without rupture: Secondary | ICD-10-CM | POA: Diagnosis present

## 2020-05-08 DIAGNOSIS — G9340 Encephalopathy, unspecified: Secondary | ICD-10-CM | POA: Diagnosis not present

## 2020-05-08 DIAGNOSIS — Z87891 Personal history of nicotine dependence: Secondary | ICD-10-CM | POA: Diagnosis not present

## 2020-05-08 DIAGNOSIS — D132 Benign neoplasm of duodenum: Secondary | ICD-10-CM | POA: Diagnosis present

## 2020-05-08 DIAGNOSIS — I129 Hypertensive chronic kidney disease with stage 1 through stage 4 chronic kidney disease, or unspecified chronic kidney disease: Secondary | ICD-10-CM | POA: Diagnosis present

## 2020-05-08 DIAGNOSIS — I130 Hypertensive heart and chronic kidney disease with heart failure and stage 1 through stage 4 chronic kidney disease, or unspecified chronic kidney disease: Secondary | ICD-10-CM | POA: Diagnosis not present

## 2020-05-08 LAB — CBC
HCT: 29.2 % — ABNORMAL LOW (ref 39.0–52.0)
Hemoglobin: 9.2 g/dL — ABNORMAL LOW (ref 13.0–17.0)
MCH: 34.5 pg — ABNORMAL HIGH (ref 26.0–34.0)
MCHC: 31.5 g/dL (ref 30.0–36.0)
MCV: 109.4 fL — ABNORMAL HIGH (ref 80.0–100.0)
Platelets: 78 10*3/uL — ABNORMAL LOW (ref 150–400)
RBC: 2.67 MIL/uL — ABNORMAL LOW (ref 4.22–5.81)
RDW: 23.9 % — ABNORMAL HIGH (ref 11.5–15.5)
WBC: 7.4 10*3/uL (ref 4.0–10.5)
nRBC: 0 % (ref 0.0–0.2)

## 2020-05-08 LAB — BASIC METABOLIC PANEL
Anion gap: 10 (ref 5–15)
BUN: 32 mg/dL — ABNORMAL HIGH (ref 8–23)
CO2: 21 mmol/L — ABNORMAL LOW (ref 22–32)
Calcium: 8.4 mg/dL — ABNORMAL LOW (ref 8.9–10.3)
Chloride: 104 mmol/L (ref 98–111)
Creatinine, Ser: 2.38 mg/dL — ABNORMAL HIGH (ref 0.61–1.24)
GFR, Estimated: 29 mL/min — ABNORMAL LOW (ref >60)
Glucose, Bld: 99 mg/dL (ref 70–99)
Potassium: 4 mmol/L (ref 3.5–5.1)
Sodium: 135 mmol/L (ref 135–145)

## 2020-05-08 LAB — PROTIME-INR
INR: 1.6 — ABNORMAL HIGH (ref 0.8–1.2)
Prothrombin Time: 18.6 seconds — ABNORMAL HIGH (ref 11.4–15.2)

## 2020-05-08 LAB — SURGICAL PATHOLOGY

## 2020-05-08 MED ORDER — ACETAMINOPHEN 325 MG PO TABS
650.0000 mg | ORAL_TABLET | Freq: Four times a day (QID) | ORAL | Status: DC | PRN
  Administered 2020-05-08: 650 mg via ORAL
  Filled 2020-05-08: qty 2

## 2020-05-08 MED ORDER — ORAL CARE MOUTH RINSE
15.0000 mL | Freq: Two times a day (BID) | OROMUCOSAL | Status: DC
  Administered 2020-05-08 – 2020-05-09 (×3): 15 mL via OROMUCOSAL

## 2020-05-08 MED ORDER — PANTOPRAZOLE SODIUM 40 MG PO TBEC
40.0000 mg | DELAYED_RELEASE_TABLET | Freq: Two times a day (BID) | ORAL | Status: DC
  Administered 2020-05-08 – 2020-05-09 (×2): 40 mg via ORAL
  Filled 2020-05-08 (×2): qty 1

## 2020-05-08 MED ORDER — WARFARIN SODIUM 5 MG PO TABS
7.5000 mg | ORAL_TABLET | Freq: Once | ORAL | Status: AC
  Administered 2020-05-08: 7.5 mg via ORAL
  Filled 2020-05-08: qty 1

## 2020-05-08 MED ORDER — ENOXAPARIN SODIUM 60 MG/0.6ML ~~LOC~~ SOLN
60.0000 mg | SUBCUTANEOUS | Status: DC
  Administered 2020-05-08: 60 mg via SUBCUTANEOUS
  Filled 2020-05-08: qty 0.6

## 2020-05-08 NOTE — Progress Notes (Signed)
NAME:  [name], MRN:  [id number], DOB:  06-08-1950, LOS: 1 ADMISSION DATE:  05/07/2020, CONSULTATION DATE: 05/07/20 REFERRING MD: Dr. Havery Moros, CHIEF COMPLAINT:  AMS   Brief History:  70 y/o M who presented to [facility] on 2/2 for colonoscopy for removal of duodenal adenoma.  Post procedure was altered, hypotensive and required intubation for airway protection in the setting of acute hypercarbic respiratory failure.  He was extubated on arrival to ICU to BiPAP.  Anticoagulation restarted without evidence of bleeding.   History of Present Illness:  70 y/o M who presented to [facility] on 2/2 for colonoscopy for removal of duodenal adenoma (seen on prior EGD).    EDG was performed with note of gastritis, normal esophagus, and a single duodenal polyp which was removed.  Prophylactic clips were placed to prevent bleeding.  The case was performed under local MAC.  Per report, he had transient hypotension which was treated with neosynephrine. In addition to hypotension, he had transient ST-T wave abnormalities which returned to baseline. EKG without ST elevation (SR with PVC's).  Post procedure in recovery he was altered and required intubation for airway protection.  The patient was transferred to ICU for admission per anesthesia.  On arrival to ICU, the patient was awake, following commands and moving all extremities.  He was placed on a spontaneous breathing trial and was successfully extubated to BiPAP support. Blood gas drawn on arrival to ICU is suspected to be venous (pH 7.160 / pCO2 68 / pO2 35 / bicarbonate 23) but consistent with hypercarbic respiratory failure.  Labs pending. Prior chart review shows elevated sr. Cr.   PCCM consulted for ICU admission.   Past Medical History:  Hepatitis C - unclear if treated Former Cigar Smoker  Rheumatic Heart Disease  Mitral Valve Repair - on chronic coumadin / lovenox bridge for procedure  AF Aneurysm of Ascending Aorta  Colon Polyps  BPH  Significant  Hospital Events:  2/02 Admit post colonoscopy with AMS in setting of hypercarbia  2/03 Stable, no evidence of bleeding   Consults:    Procedures:  ETT 2/2 >> 2/2   Significant Diagnostic Tests:    Micro Data:  COVID 1/31 >> negative   Antimicrobials:     Interim History / Subjective:  Pt reports feeling ok Afebrile  BM this am without bleeding  VSS  Tolerating diet   Objective   Blood pressure 119/69, pulse 94, temperature 98.6 F (37 C), temperature source Oral, resp. rate (!) 25, height _0  (1.854 m), weight 60.8 kg, SpO2 93 %.    Vent Mode: PCV;BIPAP FiO2 (%):  [35 %] 35 % Set Rate:  [15 bmp] 15 bmp PEEP:  [5 cmH20] 5 cmH20   Intake/Output Summary (Last 24 hours) at 05/08/2020 1553 Last data filed at 05/08/2020 1200 Gross per 24 hour  Intake 222.33 ml  Output 400 ml  Net -177.67 ml   Filed Weights   05/07/20 1122 05/07/20 1500  Weight: 63.5 kg 60.8 kg    Examination: General: adult male lying in bed in NAD  HEENT: MM pink/moist, anicteric  Neuro: AAOx4, speech clear, MAE  CV: s1s2 RRR, no m/r/g PULM: non-labored on RA, lungs bilaterally clear  GI: soft, bsx4 active  Extremities: warm/dry, no edema  Skin: no rashes or lesions   Resolved Hospital Problem list     Assessment & Plan:   Acute Hypercarbic Respiratory Failure In setting of sedation for procedure, hx tobacco abuse (no PFT's on file, CXR with hyperinfilation),  has elevated pulmonary pressures on prior ECHO (?if this is related to MV disease / L heart disease) -resolved, no acute interventions at this time  -ok to transfer to medical floor  -pulmonary hygiene - IS, mobilize  -? If he has OSA vs central sleep apnea based on breathing post colonoscopy > consider outpatient sleep study  Rheumatic Heart Disease s/p Mechanical Valve on Anticoagulation  HTN AF Aneurysm of Ascending Aorta  Last RHC 10/2019 per Dr. Haroldine Laws with PA 65/24, RA 12, PCW 12 / moderate mixed PH with moderately  depressed cardiac output  -resume lovenox bridge at 2pm  -continue coumadin  -follow INR  -appreciate Cardiology input  -follow I/O's -continue home lasix, aldactone   ? CKD  Prior chart review shows sr cr 2.41  -Trend BMP / urinary output -Replace electrolytes as indicated -Avoid nephrotoxic agents, ensure adequate renal perfusion  Colon Adenoma  Empiric clips placed during case to prevent bleeding  -per GI  -follow up pathology post biopsy  -BID PPI at discharge   Moderate Malnutrition  BMI 17  -ensure BID    BPH  -not on meds per home review  Best practice (evaluated daily)  Diet: clear liquid and advance to regular diet as tolerated  Pain/Anxiety/Delirium protocol (if indicated): n/a  VAP protocol (if indicated): n/a  DVT prophylaxis: coumadin GI prophylaxis: protonix  Glucose control: n/a  Mobility: progress as tolerated  Disposition: ICU observation   Family: Patient updated on plan of care 2/3.  Will anticipate discharge in am 2/4.   Goals of Care:  Last date of multidisciplinary goals of care discussion: n/a  Family and staff present:  Summary of discussion:  Follow up goals of care discussion due:  Code Status: Full Code   Labs   CBC: Recent Labs  Lab 05/07/20 1700 05/08/20 0236  WBC 6.2 7.4  HGB 9.7* 9.2*  HCT 32.0* 29.2*  MCV 112.3* 109.4*  PLT 85* 78*    Basic Metabolic Panel: Recent Labs  Lab 05/07/20 1700 05/08/20 0236  NA 138 135  K 3.7 4.0  CL 105 104  CO2 25 21*  GLUCOSE 102* 99  BUN 33* 32*  CREATININE 2.49* 2.38*  CALCIUM 8.4* 8.4*   GFR: Estimated Creatinine Clearance: 25.9 mL/min (A) (by C-G formula based on SCr of 2.38 mg/dL (H)). Recent Labs  Lab 05/07/20 1700 05/08/20 0236  WBC 6.2 7.4    Liver Function Tests: Recent Labs  Lab 05/07/20 1700  AST 105*  ALT 41  ALKPHOS 55  BILITOT 2.8*  PROT 8.1  ALBUMIN 3.7   No results for input(s): LIPASE, AMYLASE in the last 168 hours. No results for input(s):  AMMONIA in the last 168 hours.  ABG    Component Value Date/Time   PHART 7.272 (L) 05/07/2020 1605   PCO2ART 57.3 (H) 05/07/2020 1605   PO2ART <31.0 (LL) 05/07/2020 1605   HCO3 23.0 05/07/2020 2145   TCO2 25 11/26/2019 1327   ACIDBASEDEF 2.8 (H) 05/07/2020 2145   O2SAT 49.2 05/07/2020 2145     Coagulation Profile: Recent Labs  Lab 05/07/20 1700 05/08/20 0236  INR 1.6* 1.6*    Cardiac Enzymes: No results for input(s): CKTOTAL, CKMB, CKMBINDEX, TROPONINI in the last 168 hours.  HbA1C: No results found for: HGBA1C  CBG: Recent Labs  Lab 05/07/20 1343  GLUCAP 140*     Critical care time:      Noe Gens, MSN, APRN, NP-C, AGACNP-BC [facility] 05/08/2020, 3:53 PM  Please see [email or web address] for pager details.   From 7A-7P if no response, please call [phone] After hours, please call ELink [phone]

## 2020-05-08 NOTE — Consult Note (Signed)
Cardiology Consultation:   Patient ID: [name] MRN: [id number]; DOB: Aug 30, 1950  Admit date: 05/07/2020 Date of Consult: 05/08/2020  Primary Care Provider: [name], DO [facility] Cardiologist: Sinclair Grooms, MD  [facility] Electrophysiologist:  None    Patient Profile:   [name] is a 70 y.o. 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 Carlis Abbott.  History of Present Illness:   [name] has a history of chronic diastolic heart failure, chronic right heart failure with pulmonary hypertension, chronic atrial fibrillation and a mechanical mitral valve due to rheumatic heart disease. He is maintained on coumadin. He has had three mitral valve replacements (porcine 1973, mechanical 1988, mechanical 2012). He had a paravalvular leak with hemolysis leading to the third replacement. Ascending aortic aneurysm of 4.5 cm. He also has chronic Afib/flutter. INR goal is 2.5-3.5.  He has significant LVH. Chronic diastolic heart failure felt to be responsible for chronic right heart failure.  He has seen AHF team for pulmonary hypertension WHO group 2, who has recommended medical therapy.   Echo 10/2018 with EF 60-65%, moderately increased LV wall thickness, moderate LVH, severely reduced RV function, PA pressure 54.9 mmHg, 4.4 cm aortic root, and mild AI, mild TR. Right heart cath August 2020 with moderate mixed PH, volume overload, and moderately to severely reduced CO. Repeat RHC 10/2019 with moderate PH and moderately depressed cardiac output.   He was admitted 11/2019 for GIB 5 days after polypectomy despite holding lovenox. Required transfusion (3U) and sigmoid colon clips with repeat colonoscopy.  Pt presented for scheduled EGD for duodenal  polypectomy and was slow to wake up after procedure. He decompensated, porgressed to respiratory failure, and required emergent intubation. He was admitted for further support. PCCM managing his hypercapnia and acute metabolic encephalopathy.   Mrs. Hettich requested a cardiology consult.   On exam, patient is sitting up in bed, NAD. He is off BiPAP (since ~3AM) and is maintaining on room air. Per GI, will hold off on restarting lovenox until 24 hr post-procedure (this afternoon). He denies excess fluid, dyspnea, and orthopnea. No chest pain, no cardiac complaints.   Past Medical History:  Diagnosis Date  . Allergy   . Aneurysm of ascending aorta (HCC)   . Atrial fibrillation ([place])   . BPH (benign prostatic hyperplasia)   . Colon polyps   . FHx: rheumatic heart disease   . H/O diplopia   . Hepatitis C   . Hypertension   . Hypogonadism male   . Mitral valve disease    mitral valve repair/replacement x3    Past Surgical History:  Procedure Laterality Date  . BIOPSY  11/19/2019   Procedure: BIOPSY;  Surgeon: Yetta Flock, MD;  Location: [facility];  Service: Gastroenterology;;  EGD and COLON  . COLON SURGERY    . COLONOSCOPY    . COLONOSCOPY N/A 11/14/2012   Procedure: COLONOSCOPY;  Surgeon: Lear Ng, MD;  [facility];  Service: Endoscopy;  Laterality: N/A;  . COLONOSCOPY WITH PROPOFOL N/A 11/19/2019   Procedure: COLONOSCOPY WITH PROPOFOL;  Surgeon: Yetta Flock, MD;  [facility];  Service: Gastroenterology;  Laterality: N/A;  . COLONOSCOPY WITH PROPOFOL N/A 11/27/2019   Procedure: COLONOSCOPY WITH PROPOFOL;  Surgeon: Wilfrid Lund  L III, MD;  [facility];  Service: Gastroenterology;  Laterality: N/A;  . ESOPHAGOGASTRODUODENOSCOPY (EGD) WITH PROPOFOL N/A 05/08/2014   Procedure: ESOPHAGOGASTRODUODENOSCOPY (EGD) WITH PROPOFOL;  Surgeon: Lear Ng, MD;  Location: [place];  Service: Endoscopy;  Laterality: N/A;  .  ESOPHAGOGASTRODUODENOSCOPY (EGD) WITH PROPOFOL N/A 11/19/2019   Procedure: ESOPHAGOGASTRODUODENOSCOPY (EGD) WITH PROPOFOL;  Surgeon: Yetta Flock, MD;  [facility];  Service: Gastroenterology;  Laterality: N/A;  . HEMOSTASIS CLIP PLACEMENT  11/19/2019   Procedure: HEMOSTASIS CLIP PLACEMENT;  Surgeon: Yetta Flock, MD;  [facility];  Service: Gastroenterology;;  . HEMOSTASIS CLIP PLACEMENT  11/27/2019   Procedure: HEMOSTASIS CLIP PLACEMENT;  Surgeon: Doran Stabler, MD;  [facility];  Service: Gastroenterology;;  . MITRAL VALVE REPLACEMENT     1973 (bioprosthesis), 1988 (St Jude mechanical vlave) , 2006 (repair due to leak)  . POLYPECTOMY  11/19/2019   Procedure: POLYPECTOMY;  Surgeon: Yetta Flock, MD;  Location: [facility];  Service: Gastroenterology;;  . RIGHT HEART CATH N/A 11/24/2018   Procedure: RIGHT HEART CATH;  Surgeon: Jolaine Artist, MD;  Location: [place] [facility];  Service: Cardiovascular;  Laterality: N/A;  . RIGHT HEART CATH N/A 10/23/2019   Procedure: RIGHT HEART CATH;  Surgeon: Jolaine Artist, MD;  Location: [place] [facility];  Service: Cardiovascular;  Laterality: N/A;  . UPPER GASTROINTESTINAL ENDOSCOPY     02-2010 and 05-2014     Home Medications:  Prior to Admission medications   Medication Sig Start Date End Date Taking? Authorizing Provider  enoxaparin (LOVENOX) 60 MG/0.6ML injection Inject 0.6 mLs (60 mg total) into the skin daily. 04/24/20  Yes Belva Crome, MD  ferrous sulfate 325 (65 FE) MG tablet Take 1 tablet (325 mg total) by mouth every other day. In the evening. Patient taking differently: Take 325 mg by mouth every other day. 12/02/19  Yes Nita Sells, MD  furosemide (LASIX) 40 MG tablet TAKE 1 TABLET(40 MG) BY MOUTH DAILY Patient taking differently: Take 40 mg by mouth daily. 03/17/20  Yes Bensimhon, Shaune Pascal, MD  metolazone (ZAROXOLYN) 2.5 MG tablet Take as directed by the heart  failure clinic Patient taking differently: Take 2.5 mg by mouth daily as needed (Swelling). Take as directed by the heart failure clinic 03/25/20  Yes Bensimhon, Shaune Pascal, MD  polyethylene glycol (MIRALAX) 17 g packet Take 17 g by mouth daily as needed. Patient taking differently: Take 17 g by mouth daily. 08/28/19  Yes Armbruster, Carlota Raspberry, MD  potassium chloride SA (KLOR-CON) 20 MEQ tablet Take 1 tablet (20 mEq total) by mouth daily. 12/11/19  Yes Bensimhon, Shaune Pascal, MD  sildenafil (REVATIO) 20 MG tablet Take 20 mg by mouth daily as needed (ED). 11/13/19  Yes [provider]  spironolactone (ALDACTONE) 25 MG tablet TAKE 1/2 TABLET(12.5 MG) BY MOUTH DAILY Patient taking differently: Take 12.5 mg by mouth daily. 02/18/20  Yes Bensimhon, Shaune Pascal, MD  Testosterone 1.62 % GEL Apply 2 Pump topically daily. 1 pump on each arm 03/05/20  Yes [provider]  warfarin (COUMADIN) 5 MG tablet TAKE 1/2 TABLET BY MOUTH ON THURSDAY AND SATURDAY. THEN TAKE 1 TABLET BY MOUTH ON SUNDAY, [place], [place], [place], [place]. Patient taking differently: Take 2.5-5 mg by mouth See admin instructions. Take 5 mg every day EXCEPT: Sunday take 2.5 mg 02/27/20  Yes Anderson, Chelsey L, DO  amoxicillin (AMOXIL) 500 MG capsule TAKE 4 CAPSULES BY MOUTH 1 HOUR BEFORE DENTAL APPOINTMENT  Patient taking differently: Take 2,000 mg by mouth once. 1 HOUR BEFORE DENTAL APPOINTMENT 03/06/20   Belva Crome, MD    Inpatient Medications: Scheduled Meds: . Chlorhexidine Gluconate Cloth  6 each Topical Daily  . feeding supplement  237 mL Oral BID BM  . ferrous sulfate  325 mg Oral QODAY  . furosemide  40 mg Oral Daily  . mouth rinse  15 mL Mouth Rinse BID  . pantoprazole (PROTONIX) IV  40 mg Intravenous Q12H  . polyethylene glycol  17 g Oral Daily  . potassium chloride SA  20 mEq Oral Daily  . spironolactone  12.5 mg Oral Daily  . Warfarin - Pharmacist Dosing Inpatient   Does not apply q1600   Continuous  Infusions: . sodium chloride 10 mL/hr at 05/07/20 1739   PRN Meds: ondansetron (ZOFRAN) IV  Allergies:    Allergies  Allergen Reactions  . Penicillins Rash    Did it involve swelling of the face/tongue/throat, SOB, or low BP? No Did it involve sudden or severe rash/hives, skin peeling, or any reaction on the inside of your mouth or nose? No Did you need to seek medical attention at a hospital or doctor's office? No When did it last happen?30 + years If all above answers are "NO", may proceed with cephalosporin use.     Social History:   Social History   Socioeconomic History  . Marital status: Divorced    Spouse name: Not on file  . Number of children: 2  . Years of education: Not on file  . Highest education level: Some college, no degree  Occupational History  . Not on file  Tobacco Use  . Smoking status: Former Smoker    Types: Cigars    Quit date: 09/03/2017    Years since quitting: 2.6  . Smokeless tobacco: Never Used  Vaping Use  . Vaping Use: Never used  Substance and Sexual Activity  . Alcohol use: No  . Drug use: No  . Sexual activity: Not on file  Other Topics Concern  . Not on file  Social History Narrative   Lives at home alone    Left handed   Caffeine: 1-2 cups daily   Social Determinants of Health   Financial Resource Strain: Not on file  Food Insecurity: Not on file  Transportation Needs: Not on file  Physical Activity: Not on file  Stress: Not on file  Social Connections: Not on file  Intimate Partner Violence: Not on file    Family History:    Family History  Family history unknown: Yes     ROS:  Please see the history of present illness.   All other ROS reviewed and negative.     Physical Exam/Data:   Vitals:   05/08/20 0400 05/08/20 0500 05/08/20 0600 05/08/20 0800  BP: 107/63 115/62 112/61   Pulse: 93 94 94   Resp: 20 (!) 23    Temp: 99 F (37.2 C)   99.2 F (37.3 C)  TempSrc: Oral   Oral  SpO2: 98% 98% 93%    Weight:      Height:        Intake/Output Summary (Last 24 hours) at 05/08/2020 0948 Last data filed at 05/08/2020 0600 Gross per 24 hour  Intake 500 ml  Output 400 ml  Net 100 ml   Last 3 Weights 05/07/2020 05/07/2020 03/13/2020  Weight (lbs) 134 lb 0.6 oz 140 lb 140 lb 12.8 oz  Weight (kg) 60.8 kg 63.504 kg 63.866  kg     Body mass index is 17.68 kg/m.  General:  Thin male, NAD HEENT: normal Neck: no JVD Vascular: No carotid bruits  Cardiac:  normal S1, S2; RRR; valve click Lungs:  Respirations unlabored, crackles in bases Abd: soft, nontender, no hepatomegaly  Ext: no edema Musculoskeletal:  No deformities, BUE and BLE strength normal and equal Skin: warm and dry  Neuro:  CNs 2-12 intact, no focal abnormalities noted Psych:  Normal affect   EKG:  The EKG was personally reviewed and demonstrates:  Artifact, but I do appreciate p waves, suspect sinus rhythm HR 93, PVC  Telemetry:  Telemetry was personally reviewed and demonstrates:  Afib in the 90s  Relevant CV Studies:  Right heart cath 10/2019: Findings:  RA = 12 RV = 65/12 PA = 65/24 (41) PCW = 20 (v-wave 37) Fick cardiac output/index = 3.85/2.13 PVR = 5.5 FA sat = 98% PA sat = 52%, 54% PAPi = 3.4  Assessment: 1. Moderate mixed PH with moderately depressed cardiac output  Plan/Discussion:  Relatively stable hemodynamics. With elevated PVR consider careful inititiation of PDE-5 at next visit.    Laboratory Data:  High Sensitivity Troponin:   Recent Labs  Lab 05/07/20 1700  TROPONINIHS 40*     Chemistry Recent Labs  Lab 05/07/20 1700 05/08/20 0236  NA 138 135  K 3.7 4.0  CL 105 104  CO2 25 21*  GLUCOSE 102* 99  BUN 33* 32*  CREATININE 2.49* 2.38*  CALCIUM 8.4* 8.4*  GFRNONAA 28* 29*  ANIONGAP 8 10    Recent Labs  Lab 05/07/20 1700  PROT 8.1  ALBUMIN 3.7  AST 105*  ALT 41  ALKPHOS 55  BILITOT 2.8*   Hematology Recent Labs  Lab 05/07/20 1700 05/08/20 0236  WBC 6.2 7.4  RBC  2.85* 2.67*  HGB 9.7* 9.2*  HCT 32.0* 29.2*  MCV 112.3* 109.4*  MCH 34.0 34.5*  MCHC 30.3 31.5  RDW 23.6* 23.9*  PLT 85* 78*   BNPNo results for input(s): BNP, PROBNP in the last 168 hours.  DDimer No results for input(s): DDIMER in the last 168 hours.   Radiology/Studies:  DG CHEST PORT 1 VIEW  Result Date: 05/07/2020 CLINICAL DATA:  Endotracheal tube placement. EXAM: PORTABLE CHEST 1 VIEW COMPARISON:  Same day at 1354 hours and CT chest 09/19/2018. FINDINGS: Endotracheal tube terminates 4.2 cm above the carina. Heart is enlarged, stable. Ascending aortic aneurysm and markedly dilated pulmonic trunk are better seen on 09/19/2018. There may be mild central pulmonary vascular congestion. No definite pleural fluid. No pneumothorax. IMPRESSION: 1. Endotracheal tube is in satisfactory position. 2. Mild central pulmonary vascular congestion versus mild edema. 3. Ascending aortic aneurysm and markedly dilated pulmonic trunk, better seen on 09/19/2018. Electronically Signed   By: Lorin Picket M.D.   On: 05/07/2020 14:41   DG CHEST PORT 1 VIEW  Result Date: 05/07/2020 CLINICAL DATA:  Post EGD altered mental status EXAM: PORTABLE CHEST 1 VIEW COMPARISON:  11/26/2019 FINDINGS: Emphysema. No consolidation or edema. No pleural effusion. No pneumothorax. Cardiomegaly. IMPRESSION: No acute process in the chest. Electronically Signed   By: Macy Mis M.D.   On: 05/07/2020 14:25     Assessment and Plan:   Respiratory failure following MAC for EGD - hypercapnia and respiratory acidosis (ABG) - required emergent intubation - extubated and immediately placed on BiPAP - remained on BiPAP overnight - removed early this morning - now resting on room air   Mechanical mitral valve Chronic coumadin  Acute on chronic anemia - was bridged with lovenox for procedure - coumadin has been resumed, INR 1.6 - INR target 2.5-3.5 - given past history of bleed after polyp removal, will monitor closely - per  GI - restart lovenox 24 hr after procedure yesterday   Chronic atrial fibrillation - does not require rate controlling medications at baseline - asymptomatic   Chronic diastolic heart failure Pulmonary hypertension WHO 2 - maintained on 12.5 mg spironolactone and 40 mg lasix daily - continue home medications, will hold off on additional diuretic today, caution with IVF   Thoracic aortic aneurysm - followed by Dr. Cyndia Bent - not a surgical candidate    Risk Assessment/Risk Scores:   New York Heart Association (NYHA) Functional Class NYHA Class II  CHA2DS2-VASc Score = 3  This indicates a 3.2% annual risk of stroke. The patient's score is based upon: CHF History: Yes HTN History: Yes Diabetes History: No Stroke History: No Vascular Disease History: No Age Score: 1 Gender Score: 0         For questions or updates, please contact [place] Please consult [email or web address] for contact info under    Signed, Ledora Bottcher, PA  05/08/2020 9:48 AM

## 2020-05-08 NOTE — TOC Initial Note (Signed)
Transition of Care [facility]) - Initial/Assessment Note    Patient Details  Name: [name] MRN: [id number] Date of Birth: Mar 31, 1951  Transition of Care [facility]) CM/SW Contact:    Leeroy Cha, RN Phone Number: 05/08/2020, 7:35 AM  Clinical Narrative:                 70 y/o gentleman with a history of mechanical MVR and Afib due to rheumatic heart disease, colon polyps, CKD who presents post- EGD with duodenal polypectomy with post-procedure respiratory failure requiring emergent intubation. He was noted to not be waking up after propofol was discontinued from his procedure. BVM was initiated and he was intubated without medications. Upon arrival to the ICU he was more alert and purposeful. ABG demonstrated acute hypercapnia and respiratory acidosis. Previously has had post-polypectomy bleeding, but no previous history of pulmonary disease. Remote smoking history <20 years, but quit decades ago. No PTA inhalers. Currently he denies complaints.     BP 95/60 (BP Location: Right Arm)   Pulse 60   Temp 98.3 F (36.8 C) (Oral)   Resp 14   Ht 6\' 1"  (1.854 m)   Wt 60.8 kg   SpO2 100%   BMI 17.68 kg/m      Chronically ill appearing man sitting up in bed in NAD, occasionally fighting ETT. Post extubation comfortably on BiPAP.  [place]/AT, eyes anicteric  CTAB, taking large Vt on BiPAP when awake but hypoventilating until alarms wake him up when he falls asleep.   Tachycardic, irreg rhythm, mech click  Abd thin, soft  No peripheral edema. No cyanosis or clubbing.  Awake, able to answer orientation questions normally post-extubation. Moving all extremities.  Skin warm, well perfused.  Progression-bipap to 2l via [place] to room air, mentation clearing hgb 9.2 bun-3.2 and creat 2.38 Plan to return to home with self care. Hx of chronic kidney diease is divorced and lives alone.  Expected Discharge Plan: Home/Self Care Barriers to Discharge: Continued Medical Work up   Patient Goals and CMS  Choice Patient states their goals for this hospitalization and ongoing recovery are:: to go home CMS Medicare.gov Compare Post Acute Care list provided to:: Patient    Expected Discharge Plan and Services Expected Discharge Plan: Home/Self Care       Living arrangements for the past 2 months: Apartment                                      Prior Living Arrangements/Services Living arrangements for the past 2 months: Apartment Lives with:: Self   Do you feel safe going back to the place where you live?: Yes      Need for Family Participation in Patient Care: No (Comment) Care giver support system in place?: No (comment)   Criminal Activity/Legal Involvement Pertinent to Current Situation/Hospitalization: No - Comment as needed  Activities of Daily Living      Permission Sought/Granted                  Emotional Assessment Appearance:: Appears stated age Attitude/Demeanor/Rapport: Inconsistent Affect (typically observed): Agitated Orientation: : Fluctuating Orientation (Suspected and/or reported Sundowners) Alcohol / Substance Use: Not Applicable Psych Involvement: No (comment)  Admission diagnosis:  Acute encephalopathy [G93.40] Patient Active Problem List   Diagnosis Date Noted  . Acute encephalopathy 05/07/2020  . Duodenal adenoma   . Anticoagulated   . Acute blood loss anemia 11/26/2019  .  Gastric polyp   . Benign neoplasm of colon   . Encounter for medication review 03/23/2019  . Vertigo 06/07/2018  . Acute on chronic diastolic heart failure ([place]) 03/04/2018  . Cirrhosis ([place]) 05/08/2014  . Chronic atrial flutter ([place]) 06/05/2013  . Hepatitis C 06/05/2013  . Chronic diastolic heart failure ([place]) 06/05/2013  . Encounter for therapeutic drug monitoring 04/30/2013  . 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 History of colonic polyps 11/14/2012  . Thoracic aortic aneurysm ([place]) 05/02/2012   PCP:  [name], DO Pharmacy:    [place], [place] [address] Phone: [phone] Fax: [place], [place] [place] [place] [address] Phone: [phone] Fax: [phone]     Social Determinants of Health (SDOH) Interventions    Readmission Risk Interventions No flowsheet data found.

## 2020-05-08 NOTE — Progress Notes (Signed)
GI UPDATE  Patient ate well, feels okay, continues to do well off BiPAP. Had a non bloody normal BM today. Plan to start lovenox this afternoon. Continue protonix 40mg  BID. Okay for discharge home today from bleeding perspective post polypectomy however in discussion with patient he is rather anxious about what happened yesterday and in light of his history of bleeding with polypectomy, he would prefer to be monitored tonight in the hospital with plans for discharge home in the AM if he does well. We will reassess him tomorrow, please call with questions.  Escalante Cellar, MD [facility]

## 2020-05-08 NOTE — Progress Notes (Addendum)
ANTICOAGULATION CONSULT NOTE - Follow Up Consult  Pharmacy Consult for Warfarin Indication: Mechanical Valve Replacement  Allergies  Allergen Reactions  . Penicillins Rash    Did it involve swelling of the face/tongue/throat, SOB, or low BP? No Did it involve sudden or severe rash/hives, skin peeling, or any reaction on the inside of your mouth or nose? No Did you need to seek medical attention at a hospital or doctor's office? No When did it last happen?30 + years If all above answers are "NO", may proceed with cephalosporin use.     Patient Measurements: Height: 6\' 1"  (185.4 cm) Weight: 60.8 kg (134 lb 0.6 oz) IBW/kg (Calculated) : 79.9 Heparin Dosing Weight:   Vital Signs: Temp: 99.2 F (37.3 C) (02/03 0800) Temp Source: Oral (02/03 0800) BP: 114/78 (02/03 0900) Pulse Rate: 93 (02/03 0900)  Labs: Recent Labs    05/07/20 1700 05/08/20 0236  HGB 9.7* 9.2*  HCT 32.0* 29.2*  PLT 85* 78*  LABPROT 18.0* 18.6*  INR 1.6* 1.6*  CREATININE 2.49* 2.38*  TROPONINIHS 40*  --     Estimated Creatinine Clearance: 25.9 mL/min (A) (by C-G formula based on SCr of 2.38 mg/dL (H)).   Medications:  Scheduled:  . Chlorhexidine Gluconate Cloth  6 each Topical Daily  . feeding supplement  237 mL Oral BID BM  . ferrous sulfate  325 mg Oral QODAY  . furosemide  40 mg Oral Daily  . mouth rinse  15 mL Mouth Rinse BID  . pantoprazole (PROTONIX) IV  40 mg Intravenous Q12H  . polyethylene glycol  17 g Oral Daily  . potassium chloride SA  20 mEq Oral Daily  . spironolactone  12.5 mg Oral Daily  . Warfarin - Pharmacist Dosing Inpatient   Does not apply q1600   Infusions:  . sodium chloride 10 mL/hr at 05/07/20 1739   PTA: Warfarin 5mg  daily except 2.5 mg on Sundays. Pt with a visit to clinic with the following instructions:  2/1:No Lovenox and No Warfarin. 2/2: Procedure Day-No enoxaparin - Resume warfarin in the evening or as directed by doctor (take an extra half tablet  with usual dose for 2 days then resume normal dose). 2/3: Resume enoxaparin inject in the fattytissue at8amand take warfarin(take an extra half tablet with usual dose for 2 days then resume normal dose). 2/4: Inject enoxaparin in the fatty tissueat 8amand take warfarin 2/5: Inject enoxaparin in the fatty tissueat 8amand take warfarin 2/6: Inject enoxaparin in the fatty tissueat 8am andtake warfarin 2/7: Inject enoxaparin in the fatty tissueat 8am and take warfarin 2/8: Inject enoxaparin in the fatty tissueat 8am and report forINR check.  Assessment: Pharmacy is consulted to dose warfarin in 70 yo pt with hx of mechanical mitral valve. Pt admitted to ICU after GI procedure with history of bleeding after previous polyp removal.  Holding Lovenox bridge per GI recommendations on POD0.  Today, 05/08/2020: INR 1.6, remains subtherapeutic as expected CBC: Hgb remains low at 9.2, Plt low at 78k  (Chronic anemia, near baseline) No s/s bleeding reported. SCr 2.38 with CrCl ~ 26 ml/min (near baseline)  Goal of Therapy:  INR 2.5-3.5 Monitor platelets by anticoagulation protocol: Yes   Plan:  Warfarin 7.5 mg PO x 1 at 1600 Daily PT/INR. Monitor for signs and symptoms of bleeding.  Gretta Arab PharmD, BCPS Clinical Pharmacist [facility] [phone] 05/08/2020 10:05 AM   Addendum:  Per Noe Gens, no bleeding or complications noted.  OK to start Enoxaparin bridge today at 14:00 (  24 hrs after procedure). Enoxaparin 60mg  SQ q24h Follow up SCr, CBC  Gretta Arab PharmD, BCPS Clinical Pharmacist [facility] [phone] 05/08/2020 12:12 PM

## 2020-05-08 NOTE — Progress Notes (Signed)
Progress Note   Subjective  Patient did well overnight. Was able to come off BiPaP, states he is breathing okay. Has mild cough. No bowel movements overnight or bleeding symptoms. He is hungry and wants to eat.    Objective   Vital signs in last 24 hours: Temp:  [98 F (36.7 C)-99 F (37.2 C)] 99 F (37.2 C) (02/03 0400) Pulse Rate:  [60-95] 94 (02/03 0600) Resp:  [14-30] 23 (02/03 0500) BP: (94-150)/(55-78) 112/61 (02/03 0600) SpO2:  [93 %-100 %] 93 % (02/03 0600) FiO2 (%):  [35 %-100 %] 35 % (02/02 2117) Weight:  [60.8 kg-63.5 kg] 60.8 kg (02/02 1500) Last BM Date:  (PTA) General:    white male in NAD Abdomen:  Soft, nontender and nondistended.  Intake/Output from previous day: 02/02 0701 - 02/03 0700 In: 500 [I.V.:500] Out: 400 [Urine:400] Intake/Output this shift: No intake/output data recorded.  Lab Results: Recent Labs    05/07/20 1700 05/08/20 0236  WBC 6.2 7.4  HGB 9.7* 9.2*  HCT 32.0* 29.2*  PLT 85* 78*   BMET Recent Labs    05/07/20 1700 05/08/20 0236  NA 138 135  K 3.7 4.0  CL 105 104  CO2 25 21*  GLUCOSE 102* 99  BUN 33* 32*  CREATININE 2.49* 2.38*  CALCIUM 8.4* 8.4*   LFT Recent Labs    05/07/20 1700  PROT 8.1  ALBUMIN 3.7  AST 105*  ALT 41  ALKPHOS 55  BILITOT 2.8*   PT/INR Recent Labs    05/07/20 1700 05/08/20 0236  LABPROT 18.0* 18.6*  INR 1.6* 1.6*    Studies/Results: DG CHEST PORT 1 VIEW  Result Date: 05/07/2020 CLINICAL DATA:  Endotracheal tube placement. EXAM: PORTABLE CHEST 1 VIEW COMPARISON:  Same day at 1354 hours and CT chest 09/19/2018. FINDINGS: Endotracheal tube terminates 4.2 cm above the carina. Heart is enlarged, stable. Ascending aortic aneurysm and markedly dilated pulmonic trunk are better seen on 09/19/2018. There may be mild central pulmonary vascular congestion. No definite pleural fluid. No pneumothorax. IMPRESSION: 1. Endotracheal tube is in satisfactory position. 2. Mild central pulmonary  vascular congestion versus mild edema. 3. Ascending aortic aneurysm and markedly dilated pulmonic trunk, better seen on 09/19/2018. Electronically Signed   By: Lorin Picket M.D.   On: 05/07/2020 14:41   DG CHEST PORT 1 VIEW  Result Date: 05/07/2020 CLINICAL DATA:  Post EGD altered mental status EXAM: PORTABLE CHEST 1 VIEW COMPARISON:  11/26/2019 FINDINGS: Emphysema. No consolidation or edema. No pleural effusion. No pneumothorax. Cardiomegaly. IMPRESSION: No acute process in the chest. Electronically Signed   By: Macy Mis M.D.   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 Post operatively he did not awake normally, altered mental status, found to have hypercarbic respiratory failure. Was intubated per anesthesia with quick improvement in mentation and observed overnight in ICU on BiPAP. His mental statis is appropriate this AM, now off BiPAP, hungry, no evidence of bleeding. Very unfortunate occurrence yesterday but he has done well overnight so far.   Recommend: - okay for soft diet this AM - can continue coumadin - if resuming lovenox bridge today, would wait 24 hours since his endoscopy (which would be this afternoon) given his bleeding risks. He unfortunately had post polypectomy bleeding last year following colonic  polyp removal. 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  I am available for  questions. Otherwise will be back to see him midday.  Appreciate critical care's help with this case.  Baker Cellar, MD [facility]

## 2020-05-09 ENCOUNTER — Inpatient Hospital Stay (HOSPITAL_COMMUNITY): Payer: Medicare HMO

## 2020-05-09 DIAGNOSIS — D132 Benign neoplasm of duodenum: Secondary | ICD-10-CM | POA: Diagnosis not present

## 2020-05-09 DIAGNOSIS — Z952 Presence of prosthetic heart valve: Secondary | ICD-10-CM | POA: Diagnosis not present

## 2020-05-09 DIAGNOSIS — R0600 Dyspnea, unspecified: Secondary | ICD-10-CM | POA: Diagnosis not present

## 2020-05-09 DIAGNOSIS — Z7901 Long term (current) use of anticoagulants: Secondary | ICD-10-CM | POA: Diagnosis not present

## 2020-05-09 DIAGNOSIS — J90 Pleural effusion, not elsewhere classified: Secondary | ICD-10-CM | POA: Diagnosis not present

## 2020-05-09 DIAGNOSIS — G9340 Encephalopathy, unspecified: Secondary | ICD-10-CM | POA: Diagnosis not present

## 2020-05-09 DIAGNOSIS — J811 Chronic pulmonary edema: Secondary | ICD-10-CM | POA: Diagnosis not present

## 2020-05-09 LAB — PROTIME-INR
INR: 2.1 — ABNORMAL HIGH (ref 0.8–1.2)
Prothrombin Time: 22.4 seconds — ABNORMAL HIGH (ref 11.4–15.2)

## 2020-05-09 MED ORDER — WARFARIN SODIUM 5 MG PO TABS: 5.0000 mg | ORAL_TABLET | Freq: Once | ORAL | Status: DC

## 2020-05-09 MED ORDER — PANTOPRAZOLE SODIUM 40 MG PO TBEC: 40.0000 mg | DELAYED_RELEASE_TABLET | Freq: Two times a day (BID) | ORAL | 0 refills | Status: DC

## 2020-05-09 MED ORDER — WARFARIN SODIUM 5 MG PO TABS: 2.5000 mg | ORAL_TABLET | ORAL | Status: DC

## 2020-05-09 NOTE — Progress Notes (Signed)
Progress Note   Subjective  Patient had a non bloody BM overnight. Eating okay. Denies pain. Does have an intermittent cough, has developed some mild throat / neck soreness, and has some shortness of breath when he lies down. This started at some point later yesterday and he didn't sleep well because of it. Vitals stable, normal sats on room air. He denies any chest pains or abdominal pains.   Objective   Vital signs in last 24 hours: Temp:  [97.6 F (36.4 C)-99.2 F (37.3 C)] 98.4 F (36.9 C) (02/04 0526) Pulse Rate:  [69-95] 69 (02/04 0526) Resp:  [16-30] 18 (02/04 0526) BP: (103-121)/(61-78) 103/63 (02/04 0526) SpO2:  [90 %-97 %] 97 % (02/04 0526) Last BM Date: 05/08/20 General:    AA male in NAD Lungs: Respirations even and unlabored Abdomen:  Soft, nontender and nondistended.  Extremities:  Without edema. Neurologic:  Alert and oriented,  grossly normal neurologically. Psych:  Cooperative. Normal mood and affect.  Intake/Output from previous day: 02/03 0701 - 02/04 0700 In: 222.3 [I.V.:222.3] Out: -  Intake/Output this shift: No intake/output data recorded.  Lab Results: Recent Labs    05/07/20 1700 05/08/20 0236  WBC 6.2 7.4  HGB 9.7* 9.2*  HCT 32.0* 29.2*  PLT 85* 78*   BMET Recent Labs    05/07/20 1700 05/08/20 0236  NA 138 135  K 3.7 4.0  CL 105 104  CO2 25 21*  GLUCOSE 102* 99  BUN 33* 32*  CREATININE 2.49* 2.38*  CALCIUM 8.4* 8.4*   LFT Recent Labs    05/07/20 1700  PROT 8.1  ALBUMIN 3.7  AST 105*  ALT 41  ALKPHOS 55  BILITOT 2.8*   PT/INR Recent Labs    05/08/20 0236 05/09/20 0512  LABPROT 18.6* 22.4*  INR 1.6* 2.1*    Studies/Results: DG CHEST PORT 1 VIEW  Result Date: 05/07/2020 CLINICAL DATA:  Endotracheal tube placement. EXAM: PORTABLE CHEST 1 VIEW COMPARISON:  Same day at 1354 hours and CT chest 09/19/2018. FINDINGS: Endotracheal tube terminates 4.2 cm above the carina. Heart is enlarged, stable. Ascending aortic  aneurysm and markedly dilated pulmonic trunk are better seen on 09/19/2018. There may be mild central pulmonary vascular congestion. No definite pleural fluid. No pneumothorax. IMPRESSION: 1. Endotracheal tube is in satisfactory position. 2. Mild central pulmonary vascular congestion versus mild edema. 3. Ascending aortic aneurysm and markedly dilated pulmonic trunk, better seen on 09/19/2018. Electronically Signed   By: Lorin Picket M.D.   On: 05/07/2020 14:41   DG CHEST PORT 1 VIEW  Result Date: 05/07/2020 CLINICAL DATA:  Post EGD altered mental status EXAM: PORTABLE CHEST 1 VIEW COMPARISON:  11/26/2019 FINDINGS: Emphysema. No consolidation or edema. No pleural effusion. No pneumothorax. Cardiomegaly. IMPRESSION: No acute process in the chest. Electronically Signed   By: Macy Mis M.D.   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 Unfortunately postprocedure he developed hypercarbic respiratory failure leading to emergent intubation. He has responded quite well, normal mental status, off BiPAP. He has not had any overt bleeding since resuming lovenox. Unfortunately has some mild cough, throat / neck soreness and some shortness of breath with lying down that bothered him overnight. Vitals stable and he looks well otherwise. Otherwise polyp removed was determined to be an  adenoma, which would warrant surveillance in the future for this perhaps in a year, however can discuss that further as outpatient and how he would want to proceed with that given the events that occurred with anesthesia for this procedure.  Plan: - continue regular diet - continue protonix 40mg  BID for 1 month - continue coumadin and lovenox, stop lovenox when INR therapeutic. No bleeding noted since the procedure. - will discuss respiratory complaints with  primary service this AM and workup for that. Not sure if his throat irritation is post intubation related or not. Disposition per primary service which will depend on respiratory status / workup.  Call with questions.  [place] Cellar, MD [facility]

## 2020-05-09 NOTE — Discharge Summary (Addendum)
Physician Discharge Summary  Patient ID: [name] MRN: [id number] DOB/AGE: [birth year] 70 y.o.  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 He will need refills from GI if longer duration needed.   Acute Hypoxic & Hypercapnic Respiratory Failure  -resolved, no acute follow up  -may benefit from outpatient sleep evaluation to assess for central sleep apneas. Was not seen after he recovered from sedation.   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  Thrombocytopenia  Question chronic liver disease related to hepatitis? vs mechanical valve -follow up with PCP  Acute Metabolic Encephalopathy in setting of Anesthesia & Hypercapnia  -resolved, no acute follow up at time of discharge   CKD 4  Discharge Plan: -renal dose medications  -continue PTA diuretic regimen  -follow up with PCP                      DISCHARGE SUMMARY    [age]/o M with  hx of mechanical mitral valve replacement in the setting of rheumatic heart disease, chronic atrial fibrillation and colon polyps who presented 2/2 for an outpatient EDG for polypectomy.  He has a history of prior bleeding after biopsy. Post procedure, the patient was altered and hypotensive and he required intubation for airway protection in the setting of acute hypercarbic (pH 7.16 / pCO2 68) respiratory failure.  Per report, he had ST-T wave abnormalities.  He was admitted to the ICU for observation.  The patient was awake on arrival to ICU. He had a mild troponin leak that was felt to be demand ischemia in the setting of hypotension.  After arrival to ICU, the patient was alert and met criteria for extubation. He was successfully liberated from mechanical ventilation.  The patient wore bipap overnight.  In the initial phase post-procedure, there were concerns for possible apnea events but these resolved.  He was restarted on anticoagulation the day of the procedure.  He had no evidence of bleeding.  The patient noted mild sore throat and cough on 2/4 which is thought to be in the setting of intubation. CXR was negative for acute process.  He was medically cleared for discharge 2/4 with plans  as above.           MICRO DATA  COVID 1/31 >> negative   CONSULTS GI  PCCM  TUBES / LINES ETT 2/2 >> 2/2    Discharge Exam: General: thin adult male sitting up in bed in NAD Neuro: AAOx4, speech clear, MAE  CV: s1s2 irr irr, mechanical valve sounds PULM: non-labored on RA, lungs bilaterally clear GI: non-distended, tolerating PO's  Extremities: warm/dry, decreased muscle mass  Vitals:   05/08/20 2059 05/09/20 0117 05/09/20 0526 05/09/20 0745  BP: 118/71 112/66 103/63   Pulse: 95 89 69   Resp: '20 16 18   ' Temp: 97.7 F (36.5 C) 97.7 F (36.5 C) 98.4 F (36.9 C)   TempSrc: Oral Oral Oral   SpO2: 90% 97% 97% 95%  Weight:      Height:         Discharge Labs  BMET Recent Labs  Lab  05/07/20 1700 05/08/20 0236  NA 138 135  K 3.7 4.0  CL 105 104  CO2 25 21*  GLUCOSE 102* 99  BUN 33* 32*  CREATININE 2.49* 2.38*  CALCIUM 8.4* 8.4*    CBC Recent Labs  Lab 05/07/20 1700 05/08/20 0236  HGB 9.7* 9.2*  HCT 32.0* 29.2*  WBC 6.2 7.4  PLT 85* 78*    Anti-Coagulation Recent Labs  Lab 05/07/20 1700 05/08/20 0236 05/09/20 0512  INR 1.6* 1.6* 2.1*    Discharge Instructions    Call MD for:  difficulty breathing, headache or visual disturbances   Complete by: As directed    Call MD for:  extreme fatigue   Complete by: As directed    Call MD for:  hives   Complete by: As directed    Call MD for:  persistant dizziness or light-headedness   Complete by: As directed    Call MD for:  persistant nausea and vomiting   Complete by: As directed    Call MD for:  redness, tenderness, or signs of infection (pain, swelling, redness, odor or green/yellow discharge around incision site)   Complete by: As directed    Call MD for:  severe uncontrolled pain   Complete by: As directed    Call MD for:  temperature >100.4   Complete by: As directed    Diet - low sodium heart healthy   Complete by: As directed    Discharge instructions   Complete by: As directed    1. Review discharge medication list carefully.  2. You should stay on protonix (new prescription to prevent ulcers) until seen by GI. You have one month of prescription, if you need more, GI will need to extend duration.  3. Continue your previously planned coumadin / lovenox bridge as below and follow up in the Coumadin Clinic on 2/8.   2/4: Inject enoxaparin in the fatty tissueat 8amand take warfarin 2/5: Inject enoxaparin in the fatty tissueat 8amand take warfarin 2/6: Inject enoxaparin in the fatty tissueat 8am andtake warfarin 2/7: Inject enoxaparin in the fatty tissueat 8am and take warfarin 2/8: Inject enoxaparin in the fatty tissueat 8am and report forINR check.  4. If you have bleeding,  call GI. If significant bleeding, report to the emergency room.  5. Your chest XRAY was normal.  Your sore throat should resolve over the next day or two.  6. Follow up with GI as planned.  7. Report to ER if new or worsening symptoms.   Increase activity slowly   Complete by: As directed  Allergies as of 05/09/2020      Reactions   Penicillins Rash   Did it involve swelling of the face/tongue/throat, SOB, or low BP? No Did it involve sudden or severe rash/hives, skin peeling, or any reaction on the inside of your mouth or nose? No Did you need to seek medical attention at a hospital or doctor's office? No When did it last happen?30 + years If all above answers are "NO", may proceed with cephalosporin use.      Medication List    TAKE these medications   amoxicillin 500 MG capsule Commonly known as: AMOXIL TAKE 4 CAPSULES BY MOUTH 1 HOUR BEFORE DENTAL APPOINTMENT What changed:   how much to take  how to take this  when to take this  additional instructions   enoxaparin 60 MG/0.6ML injection Commonly known as: LOVENOX Inject 0.6 mLs (60 mg total) into the skin daily.   ferrous sulfate 325 (65 FE) MG tablet Take 1 tablet (325 mg total) by mouth every other day. In the evening. What changed: additional instructions   furosemide 40 MG tablet Commonly known as: LASIX TAKE 1 TABLET(40 MG) BY MOUTH DAILY What changed: See the new instructions.   metolazone 2.5 MG tablet Commonly known as: ZAROXOLYN Take as directed by the heart failure clinic What changed:   how much to take  how to take this  when to take this  reasons to take this   pantoprazole 40 MG tablet Commonly known as: PROTONIX Take 1 tablet (40 mg total) by mouth 2 (two) times daily.   polyethylene glycol 17 g packet Commonly known as: MiraLax Take 17 g by mouth daily as needed. What changed: when to take this   potassium chloride SA 20 MEQ tablet Commonly known as: KLOR-CON Take 1  tablet (20 mEq total) by mouth daily.   sildenafil 20 MG tablet Commonly known as: REVATIO Take 20 mg by mouth daily as needed (ED).   spironolactone 25 MG tablet Commonly known as: ALDACTONE TAKE 1/2 TABLET(12.5 MG) BY MOUTH DAILY What changed: See the new instructions.   Testosterone 1.62 % Gel Apply 2 Pump topically daily. 1 pump on each arm   warfarin 5 MG tablet Commonly known as: COUMADIN Take as directed. If you are unsure how to take this medication, talk to your nurse or doctor. Original instructions: Take 0.5-1 tablets (2.5-5 mg total) by mouth See admin instructions. Take 5 mg every day EXCEPT: Sunday take 2.5 mg What changed: See the new instructions.       Patient has previously prescribed lovenox bridge.  He will continue this as above & follow up in the coumadin clinic.    Disposition: Home, no new home health needs identified at time of discharge.   Discharged Condition: [name] has met maximum benefit of inpatient care and is medically stable and cleared for discharge.  Patient is pending follow up as above.      Time spent on disposition:  35 Minutes.   Signed: Noe Gens, MSN, APRN, NP-C, AGACNP-BC [facility] 05/09/2020, 11:44 AM   Please see [email or web address] for pager details.   From 7A-7P if no response, please call [phone] After hours, please call Warren Lacy [phone]   PCCM:  This is a 70 year old gentleman that had postoperative hypercapnia and respiratory failure.  Was briefly intubated after a GI endoscopy.  Patient is anticoagulated for his mitral valve.  Repeat INR this morning within range.  Chest x-ray reviewed which is clear  no acute abnormality.  BP 103/63 (BP Location: Left Arm)   Pulse 69   Temp 98.4 F (36.9 C) (Oral)   Resp 18   Ht '6\' 1"'  (1.854 m)   Wt 60.8 kg   SpO2 95%   BMI 17.68 kg/m   General: Thin elderly male resting in bed comfortable HEENT: Tracking appropriately Heart: Mechanical  click, E4-L7 Lungs: Clear to auscultation bilaterally no wheeze  Labs: Reviewed INR 2.1  Assessment/plan: Colon polyps outpatient GI follow-up planned Acute hypoxemic hypercarbic respiratory failure, resolved Mitral valve on anticoagulation, continue Lovenox injection and warfarin.  Routine follow-up with Coumadin clinic as scheduled. CKD 4, follow-up with PCP.  32 minutes of time spent with discharge.  Garner Nash, DO [facility] 05/09/2020 2:27 PM

## 2020-05-09 NOTE — Progress Notes (Signed)
ANTICOAGULATION CONSULT NOTE - Follow Up Consult  Pharmacy Consult for Warfarin Indication: Mechanical Valve Replacement  Allergies  Allergen Reactions  . Penicillins Rash    Did it involve swelling of the face/tongue/throat, SOB, or low BP? No Did it involve sudden or severe rash/hives, skin peeling, or any reaction on the inside of your mouth or nose? No Did you need to seek medical attention at a hospital or doctor's office? No When did it last happen?30 + years If all above answers are "NO", may proceed with cephalosporin use.     Patient Measurements: Height: 6\' 1"  (185.4 cm) Weight: 60.8 kg (134 lb 0.6 oz) IBW/kg (Calculated) : 79.9 Heparin Dosing Weight:   Vital Signs: Temp: 98.4 F (36.9 C) (02/04 0526) Temp Source: Oral (02/04 0526) BP: 103/63 (02/04 0526) Pulse Rate: 69 (02/04 0526)  Labs: Recent Labs    05/07/20 1700 05/08/20 0236 05/09/20 0512  HGB 9.7* 9.2*  --   HCT 32.0* 29.2*  --   PLT 85* 78*  --   LABPROT 18.0* 18.6* 22.4*  INR 1.6* 1.6* 2.1*  CREATININE 2.49* 2.38*  --   TROPONINIHS 40*  --   --     Estimated Creatinine Clearance: 25.9 mL/min (A) (by C-G formula based on SCr of 2.38 mg/dL (H)).   Medications:  Scheduled:  . Chlorhexidine Gluconate Cloth  6 each Topical Daily  . enoxaparin (LOVENOX) injection  60 mg Subcutaneous Q24H  . feeding supplement  237 mL Oral BID BM  . ferrous sulfate  325 mg Oral QODAY  . furosemide  40 mg Oral Daily  . mouth rinse  15 mL Mouth Rinse BID  . pantoprazole  40 mg Oral BID  . polyethylene glycol  17 g Oral Daily  . potassium chloride SA  20 mEq Oral Daily  . spironolactone  12.5 mg Oral Daily  . Warfarin - Pharmacist Dosing Inpatient   Does not apply q1600   Infusions:  . sodium chloride Stopped (05/08/20 1010)   PTA: Warfarin 5mg  daily except 2.5 mg on Sundays. Pt with a visit to clinic with the following instructions:  2/1:No Lovenox and No Warfarin. 2/2: Procedure Day-No  enoxaparin - Resume warfarin in the evening or as directed by doctor (take an extra half tablet with usual dose for 2 days then resume normal dose). 2/3: Resume enoxaparin inject in the fattytissue at8amand take warfarin(take an extra half tablet with usual dose for 2 days then resume normal dose). 2/4: Inject enoxaparin in the fatty tissueat 8amand take warfarin 2/5: Inject enoxaparin in the fatty tissueat 8amand take warfarin 2/6: Inject enoxaparin in the fatty tissueat 8am andtake warfarin 2/7: Inject enoxaparin in the fatty tissueat 8am and take warfarin 2/8: Inject enoxaparin in the fatty tissueat 8am and report forINR check.  Assessment: Pharmacy is consulted to dose warfarin in 70 yo pt with hx of mechanical mitral valve. Pt admitted to ICU after GI procedure with history of bleeding after previous polyp removal.  Holding Lovenox bridge per GI recommendations on POD0.  Today, 05/09/2020:  INR 2.1, remains subtherapeutic   CBC: Hgb remains low at 9.2, Plt low at 78k  (Chronic anemia, near baseline) - from 2/3, no CBC on 2/4   No s/s bleeding reported.  SCr 2.38 with CrCl ~ 26 ml/min (near baseline) - from 2/3   Goal of Therapy:  INR 2.5-3.5 Monitor platelets by anticoagulation protocol: Yes   Plan:   Warfarin 5 mg PO x 1 at 1600  Enoxaparin 60  mg daily as INR is less than 2.5   Daily PT/INR.  Monitor for signs and symptoms of bleeding.   Royetta Asal, PharmD, BCPS 05/09/2020 9:36 AM

## 2020-05-13 ENCOUNTER — Ambulatory Visit: Payer: Medicare HMO | Admitting: *Deleted

## 2020-05-13 ENCOUNTER — Other Ambulatory Visit: Payer: Self-pay

## 2020-05-13 DIAGNOSIS — Z5181 Encounter for therapeutic drug level monitoring: Secondary | ICD-10-CM | POA: Diagnosis not present

## 2020-05-13 DIAGNOSIS — I059 Rheumatic mitral valve disease, unspecified: Secondary | ICD-10-CM

## 2020-05-13 DIAGNOSIS — I4892 Unspecified atrial flutter: Secondary | ICD-10-CM | POA: Diagnosis not present

## 2020-05-13 LAB — POCT INR: INR: 2.7 (ref 2.0–3.0)

## 2020-05-13 NOTE — Patient Instructions (Signed)
Description   Stop Lovenox and continue taking Warfarin 1 tablet except 1/2 tablet on Sundays. Please resume drinking 1-2 Ensures each day. Recheck INR in 1 week. Please call [phone] if you have any procedures or medication changes.

## 2020-05-16 DIAGNOSIS — N183 Chronic kidney disease, stage 3 unspecified: Secondary | ICD-10-CM | POA: Diagnosis not present

## 2020-05-16 DIAGNOSIS — R809 Proteinuria, unspecified: Secondary | ICD-10-CM | POA: Diagnosis not present

## 2020-05-16 DIAGNOSIS — R319 Hematuria, unspecified: Secondary | ICD-10-CM | POA: Diagnosis not present

## 2020-05-16 DIAGNOSIS — R609 Edema, unspecified: Secondary | ICD-10-CM | POA: Diagnosis not present

## 2020-05-27 ENCOUNTER — Other Ambulatory Visit: Payer: Self-pay

## 2020-05-27 ENCOUNTER — Ambulatory Visit (INDEPENDENT_AMBULATORY_CARE_PROVIDER_SITE_OTHER): Payer: Medicare HMO

## 2020-05-27 DIAGNOSIS — I059 Rheumatic mitral valve disease, unspecified: Secondary | ICD-10-CM

## 2020-05-27 DIAGNOSIS — Z5181 Encounter for therapeutic drug level monitoring: Secondary | ICD-10-CM

## 2020-05-27 DIAGNOSIS — I4892 Unspecified atrial flutter: Secondary | ICD-10-CM

## 2020-05-27 LAB — POCT INR: INR: 4.2 — AB (ref 2.0–3.0)

## 2020-05-27 NOTE — Patient Instructions (Addendum)
Description   Skip today's dosage of Warfarin, then start taking 1 tablet except 1/2 tablet on Sundays and Thursdays. Continue drinking 1-2 Ensures each day. Recheck INR in 2 weeks, pt states he cannot return until at least 3 weeks. Please call [phone] if you have any procedures or medication changes.

## 2020-05-31 ENCOUNTER — Other Ambulatory Visit: Payer: Self-pay | Admitting: Student in an Organized Health Care Education/Training Program

## 2020-06-02 ENCOUNTER — Other Ambulatory Visit: Payer: Self-pay | Admitting: Student in an Organized Health Care Education/Training Program

## 2020-06-02 NOTE — Telephone Encounter (Signed)
Patient needs an appointment with warfarin clinic and/or cardiology for further refills

## 2020-06-16 ENCOUNTER — Ambulatory Visit (INDEPENDENT_AMBULATORY_CARE_PROVIDER_SITE_OTHER): Payer: Medicare HMO | Admitting: Student in an Organized Health Care Education/Training Program

## 2020-06-16 ENCOUNTER — Encounter: Payer: Self-pay | Admitting: Student in an Organized Health Care Education/Training Program

## 2020-06-16 ENCOUNTER — Other Ambulatory Visit: Payer: Self-pay

## 2020-06-16 DIAGNOSIS — Z8639 Personal history of other endocrine, nutritional and metabolic disease: Secondary | ICD-10-CM | POA: Diagnosis not present

## 2020-06-16 DIAGNOSIS — N184 Chronic kidney disease, stage 4 (severe): Secondary | ICD-10-CM

## 2020-06-16 DIAGNOSIS — E876 Hypokalemia: Secondary | ICD-10-CM | POA: Diagnosis not present

## 2020-06-16 DIAGNOSIS — Z09 Encounter for follow-up examination after completed treatment for conditions other than malignant neoplasm: Secondary | ICD-10-CM

## 2020-06-16 DIAGNOSIS — B351 Tinea unguium: Secondary | ICD-10-CM | POA: Diagnosis not present

## 2020-06-16 NOTE — Patient Instructions (Signed)
It was a pleasure to see you today!  To summarize our discussion for this visit:  I'm sorry to hear that your recovery from your recent hospital stay has been going slow. Please continue to try to get good nutrition and increase your activity level as you can. If not gradually improving over the next few weeks, I would recommend following up with your cardiologist.   We are going to check your kidney function and potassium today. You can hold your potassium until we get the results.   I would also like to check your parathyroid hormone today  I will send in a topical treatment for your toenail fungus  Some additional health maintenance measures we should update are: Health Maintenance Due  Topic Date Due  . TETANUS/TDAP  Never done  . PNA vac Low Risk Adult (1 of 2 - PCV13) Never done  . INFLUENZA VACCINE  11/04/2019  .    Call the clinic at [phone] if your symptoms worsen or you have any concerns.   Thank you for allowing me to take part in your care,  Dr. Doristine Mango   Fungal Nail Infection A fungal nail infection is a common infection of the toenails or fingernails. This condition affects toenails more often than fingernails. It often affects the great, or big, toes. More than one nail may be infected. The condition can be passed from person to person (is contagious). What are the causes? This condition is caused by a fungus. Several types of fungi can cause the infection. These fungi are common in moist and warm areas. If your hands or feet come into contact with the fungus, it may get into a crack in your fingernail or toenail and cause the infection. What increases the risk? The following factors may make you more likely to develop this condition:  Being male.  Being of older age.  Living with someone who has the fungus.  Walking barefoot in areas where the fungus thrives, such as showers or locker rooms.  Wearing shoes and socks that cause your feet to  sweat.  Having a nail injury or a recent nail surgery.  Having certain medical conditions, such as: ? Athlete's foot. ? Diabetes. ? Psoriasis. ? Poor circulation. ? A weak body defense system (immune system). What are the signs or symptoms? Symptoms of this condition include:  A pale spot on the nail.  Thickening of the nail.  A nail that becomes yellow or brown.  A brittle or ragged nail edge.  A crumbling nail.  A nail that has lifted away from the nail bed.   How is this diagnosed? This condition is diagnosed with a physical exam. Your health care provider may take a scraping or clipping from your nail to test for the fungus. How is this treated? Treatment is not needed for mild infections. If you have significant nail changes, treatment may include:  Antifungal medicines taken by mouth (orally). You may need to take the medicine for several weeks or several months, and you may not see the results for a long time. These medicines can cause side effects. Ask your health care provider what problems to watch for.  Antifungal nail polish or nail cream. These may be used along with oral antifungal medicines.  Laser treatment of the nail.  Surgery to remove the nail. This may be needed for the most severe infections. It can take a long time, usually up to a year, for the infection to go away. The infection  may also come back.   Follow these instructions at home: Medicines  Take or apply over-the-counter and prescription medicines only as told by your health care provider.  Ask your health care provider about using over-the-counter mentholated ointment on your nails. Nail care  Trim your nails often.  Wash and dry your hands and feet every day.  Keep your feet dry: ? Wear absorbent socks, and change your socks frequently. ? Wear shoes that allow air to circulate, such as sandals or canvas tennis shoes. Throw out old shoes.  Do not use artificial nails.  If you go to  a nail salon, make sure you choose one that uses clean instruments.  Use antifungal foot powder on your feet and in your shoes. General instructions  Do not share personal items, such as towels or nail clippers.  Do not walk barefoot in shower rooms or locker rooms.  Wear rubber gloves if you are working with your hands in wet areas.  Keep all follow-up visits as told by your health care provider. This is important. Contact a health care provider if: Your infection is not getting better or it is getting worse after several months. Summary  A fungal nail infection is a common infection of the toenails or fingernails.  Treatment is not needed for mild infections. If you have significant nail changes, treatment may include taking medicine orally and applying medicine to your nails.  It can take a long time, usually up to a year, for the infection to go away. The infection may also come back.  Take or apply over-the-counter and prescription medicines only as told by your health care provider.  Follow instructions for taking care of your nails to help prevent infection from coming back or spreading. This information is not intended to replace advice given to you by your health care provider. Make sure you discuss any questions you have with your health care provider. Document Revised: 07/13/2018 Document Reviewed: 08/26/2017 Elsevier Patient Education  2021 [employer].

## 2020-06-16 NOTE — Progress Notes (Signed)
   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  Patient required ICU stay with intubation.  He was rapidly improving and extubated on day of admission.  Since discharge from the hospital, patient states that his recovery has been very slow.  He feels generalized weakness and fatigue especially with climbing stairs.  Some days he is able to walk around the mall without any concerns and some days he has difficulty walking even 50 feet. Denies any chest pain, palpitations, presyncopal or syncopal episodes.  He has lower extremity edema at baseline for which she takes Lasix and as needed Metolazone and this has not changed since baseline.  He is able to sleep flat with one pillow.  Dry weight per cardiology is 130-139 pounds.  Potassium abnormality-patient had a potassium of 4.0 on day of discharge and was prescribed 20 mEq potassium to take daily.  CKD 4-follows with [facility] every 6 months and just saw them 2 weeks ago.  They did not change any medications or indicate to the patient that he required any change in management.  His labs appear to be stable during the hospitalization.  Endorses having itching which is worse at night and improved with cortisone.  H/o rheumatic heart disease s/p mechanical MVR.  Follows with Coumadin clinic and his last INR was 4.2.  Attributes this to taking Ensure meal supplements which contain vitamin K so on days that he takes this supplement he increases his warfarin dose as instructed.  He has a follow-up INR visit tomorrow.  Denies any signs of bleeding.  Onychomycosis-patient states that he has history of toe fungus which has returned about 6 months ago.   OBJECTIVE:   BP 100/60   Pulse 93   Ht 6\' 1"  (1.854 m)   Wt 134 lb 6 oz (61 kg)   SpO2 96%   BMI 17.73  kg/m   General: NAD, pleasant, able to participate in exam, frail Cardiac: Holosystolic murmur with click, irregular rhythm which is consistent with couplets, 2+ radial and PT pulses bilaterally Respiratory: CTAB, normal effort, No wheezes, rales or rhonchi Abdomen: soft, nontender, nondistended, no hepatic or splenomegaly, +BS Extremities: Trace edema bilaterally WWP. Skin: warm and dry, no rashes noted Neuro: alert and oriented, no focal deficits Psych: Normal affect and mood  ASSESSMENT/PLAN:   Hospital discharge follow-up Patient has already seen already scheduled to be seen soon by GI, cardiology, nephrology.   History of hypokalemia Patient taking 20 mEq potassium daily as prescribed on day of discharge and endorses generalized itching after taking this medication nightly.  His potassium was 4.0 on day of discharge. Checking his potassium levels today and instructed patient to hold the potassium until results  CKD (chronic kidney disease) stage 4, GFR 15-29 ml/min (HCC) Continue to follow with nephrology Repeat kidney function monitoring today with labs  Onychomycosis Present in great toenail bilaterally. Avoiding oral medications due to kidney, heart, liver disease Sending topical efinaconazole for 48 weeks     [place]

## 2020-06-17 ENCOUNTER — Ambulatory Visit (INDEPENDENT_AMBULATORY_CARE_PROVIDER_SITE_OTHER): Payer: Medicare HMO

## 2020-06-17 DIAGNOSIS — Z5181 Encounter for therapeutic drug level monitoring: Secondary | ICD-10-CM

## 2020-06-17 DIAGNOSIS — Z8639 Personal history of other endocrine, nutritional and metabolic disease: Secondary | ICD-10-CM | POA: Insufficient documentation

## 2020-06-17 DIAGNOSIS — B351 Tinea unguium: Secondary | ICD-10-CM | POA: Insufficient documentation

## 2020-06-17 DIAGNOSIS — I4892 Unspecified atrial flutter: Secondary | ICD-10-CM | POA: Diagnosis not present

## 2020-06-17 DIAGNOSIS — I059 Rheumatic mitral valve disease, unspecified: Secondary | ICD-10-CM | POA: Diagnosis not present

## 2020-06-17 DIAGNOSIS — Z09 Encounter for follow-up examination after completed treatment for conditions other than malignant neoplasm: Secondary | ICD-10-CM | POA: Insufficient documentation

## 2020-06-17 DIAGNOSIS — N184 Chronic kidney disease, stage 4 (severe): Secondary | ICD-10-CM | POA: Insufficient documentation

## 2020-06-17 LAB — COMPREHENSIVE METABOLIC PANEL
ALT: 40 IU/L (ref 0–44)
AST: 147 IU/L — ABNORMAL HIGH (ref 0–40)
Albumin/Globulin Ratio: 0.9 — ABNORMAL LOW (ref 1.2–2.2)
Albumin: 4.4 g/dL (ref 3.8–4.8)
Alkaline Phosphatase: 92 IU/L (ref 44–121)
BUN/Creatinine Ratio: 16 (ref 10–24)
BUN: 39 mg/dL — ABNORMAL HIGH (ref 8–27)
Bilirubin Total: 3.5 mg/dL — ABNORMAL HIGH (ref 0.0–1.2)
CO2: 18 mmol/L — ABNORMAL LOW (ref 20–29)
Calcium: 9.4 mg/dL (ref 8.6–10.2)
Chloride: 96 mmol/L (ref 96–106)
Creatinine, Ser: 2.45 mg/dL — ABNORMAL HIGH (ref 0.76–1.27)
Globulin, Total: 4.7 g/dL — ABNORMAL HIGH (ref 1.5–4.5)
Glucose: 109 mg/dL — ABNORMAL HIGH (ref 65–99)
Potassium: 4.1 mmol/L (ref 3.5–5.2)
Sodium: 136 mmol/L (ref 134–144)
Total Protein: 9.1 g/dL — ABNORMAL HIGH (ref 6.0–8.5)
eGFR: 28 mL/min/{1.73_m2} — ABNORMAL LOW (ref >59)

## 2020-06-17 LAB — PTH, INTACT AND CALCIUM: PTH: 52 pg/mL (ref 15–65)

## 2020-06-17 LAB — POCT INR: INR: 4.1 — AB (ref 2.0–3.0)

## 2020-06-17 MED ORDER — EFINACONAZOLE 10 % EX SOLN: CUTANEOUS | 3 refills | Status: AC

## 2020-06-17 NOTE — Patient Instructions (Signed)
Description   Skip today's dosage of Warfarin, then start taking 1 tablet except 1/2 tablet on Sundays, Tuesdays and Thursdays. Continue drinking 1-2 Ensures each day. Recheck INR in 3 weeks. Please call [phone] if you have any procedures or medication changes.

## 2020-06-17 NOTE — Assessment & Plan Note (Signed)
Continue to follow with nephrology Repeat kidney function monitoring today with labs

## 2020-06-17 NOTE — Assessment & Plan Note (Signed)
Patient taking 20 mEq potassium daily as prescribed on day of discharge and endorses generalized itching after taking this medication nightly.  His potassium was 4.0 on day of discharge. Checking his potassium levels today and instructed patient to hold the potassium until results

## 2020-06-17 NOTE — Assessment & Plan Note (Signed)
Patient has already seen already scheduled to be seen soon by GI, cardiology, nephrology.

## 2020-06-17 NOTE — Assessment & Plan Note (Signed)
Present in great toenail bilaterally. Avoiding oral medications due to kidney, heart, liver disease Sending topical efinaconazole for 48 weeks

## 2020-06-25 DIAGNOSIS — R948 Abnormal results of function studies of other organs and systems: Secondary | ICD-10-CM | POA: Diagnosis not present

## 2020-06-25 DIAGNOSIS — E291 Testicular hypofunction: Secondary | ICD-10-CM | POA: Diagnosis not present

## 2020-07-02 DIAGNOSIS — N401 Enlarged prostate with lower urinary tract symptoms: Secondary | ICD-10-CM | POA: Diagnosis not present

## 2020-07-02 DIAGNOSIS — R351 Nocturia: Secondary | ICD-10-CM | POA: Diagnosis not present

## 2020-07-02 DIAGNOSIS — E291 Testicular hypofunction: Secondary | ICD-10-CM | POA: Diagnosis not present

## 2020-07-08 ENCOUNTER — Other Ambulatory Visit: Payer: Self-pay

## 2020-07-08 ENCOUNTER — Ambulatory Visit (INDEPENDENT_AMBULATORY_CARE_PROVIDER_SITE_OTHER): Payer: Medicare HMO

## 2020-07-08 DIAGNOSIS — I059 Rheumatic mitral valve disease, unspecified: Secondary | ICD-10-CM | POA: Diagnosis not present

## 2020-07-08 DIAGNOSIS — I4892 Unspecified atrial flutter: Secondary | ICD-10-CM | POA: Diagnosis not present

## 2020-07-08 DIAGNOSIS — Z5181 Encounter for therapeutic drug level monitoring: Secondary | ICD-10-CM | POA: Diagnosis not present

## 2020-07-08 LAB — POCT INR: INR: 2.8 (ref 2.0–3.0)

## 2020-07-08 NOTE — Patient Instructions (Signed)
Description   Continue on same dosage 1 tablet except 1/2 tablet on Sundays, Tuesdays and Thursdays. Continue drinking 1-2 Ensures each day. Recheck INR in 4 weeks. Please call [phone] if you have any procedures or medication changes.

## 2020-07-12 ENCOUNTER — Other Ambulatory Visit: Payer: Self-pay | Admitting: Student in an Organized Health Care Education/Training Program

## 2020-07-15 ENCOUNTER — Other Ambulatory Visit: Payer: Self-pay | Admitting: Student in an Organized Health Care Education/Training Program

## 2020-07-23 ENCOUNTER — Telehealth: Payer: Self-pay | Admitting: Interventional Cardiology

## 2020-07-23 DIAGNOSIS — R42 Dizziness and giddiness: Secondary | ICD-10-CM

## 2020-07-23 DIAGNOSIS — I5032 Chronic diastolic (congestive) heart failure: Secondary | ICD-10-CM

## 2020-07-23 NOTE — Telephone Encounter (Signed)
Pt c/o medication issue:  1. Name of Medication: furosemide (LASIX) 40 MG tablet  2. How are you currently taking this medication (dosage and times per day)? TAKE 1 TABLET(40 MG) BY MOUTH DAILYPatient taking differently: Take 40 mg by mouth daily.  3. Are you having a reaction (difficulty breathing--STAT)? No  4. What is your medication issue? Pt states since he's been taking the Lasix, he's been loosing weight pretty rapidly

## 2020-07-23 NOTE — Telephone Encounter (Signed)
Pt has been on Furosemide 40mg  QD since August.  States his weight has been steady dropping since then.  Last weight in Epic was 134lbs on 06/16/20.  Pt states his current weight at home is 122lbs.  Drinks Ensure and tries to eat regular meals.  States swelling suddenly resolved 2 days ago.  Occasionally has some lightheadedness and dizziness.  SBP is consistently below 100.  Advised I will send to Dr. Tamala Julian for review and advisement.

## 2020-07-24 NOTE — Telephone Encounter (Signed)
Agree. Sounds overdiuresed. Await labs     ----- Message -----  From: Belva Crome, MD  Sent: 07/23/2020  5:35 PM EDT  To: Jolaine Artist, MD, Loren Racer, RN  Subject: Weight loss, now 122 pounds.             Check BNP, CBC, and c-Met. This will help to determine if he is over diuresed. It will help make a decision about whether to continue diuretic therapy as is or decrease intensity.  ----- Message -----     Called pt and reviewed recommendations.  Pt agreeable to come for labs but can't come until tomorrow.  Labs scheduled.

## 2020-07-25 ENCOUNTER — Other Ambulatory Visit: Payer: Self-pay

## 2020-07-25 ENCOUNTER — Other Ambulatory Visit: Payer: Medicare HMO | Admitting: *Deleted

## 2020-07-25 DIAGNOSIS — R42 Dizziness and giddiness: Secondary | ICD-10-CM

## 2020-07-25 DIAGNOSIS — I5032 Chronic diastolic (congestive) heart failure: Secondary | ICD-10-CM | POA: Diagnosis not present

## 2020-07-26 LAB — CBC
Hematocrit: 27.3 % — ABNORMAL LOW (ref 37.5–51.0)
Hemoglobin: 9.4 g/dL — ABNORMAL LOW (ref 13.0–17.7)
MCH: 32.9 pg (ref 26.6–33.0)
MCHC: 34.4 g/dL (ref 31.5–35.7)
MCV: 96 fL (ref 79–97)
Platelets: 123 10*3/uL — ABNORMAL LOW (ref 150–450)
RBC: 2.86 x10E6/uL — ABNORMAL LOW (ref 4.14–5.80)
RDW: 19.4 % — ABNORMAL HIGH (ref 11.6–15.4)
WBC: 4.8 10*3/uL (ref 3.4–10.8)

## 2020-07-26 LAB — BASIC METABOLIC PANEL
BUN/Creatinine Ratio: 20 (ref 10–24)
BUN: 50 mg/dL — ABNORMAL HIGH (ref 8–27)
CO2: 24 mmol/L (ref 20–29)
Calcium: 9.2 mg/dL (ref 8.6–10.2)
Chloride: 94 mmol/L — ABNORMAL LOW (ref 96–106)
Creatinine, Ser: 2.56 mg/dL — ABNORMAL HIGH (ref 0.76–1.27)
Glucose: 87 mg/dL (ref 65–99)
Potassium: 3.4 mmol/L — ABNORMAL LOW (ref 3.5–5.2)
Sodium: 138 mmol/L (ref 134–144)
eGFR: 27 mL/min/{1.73_m2} — ABNORMAL LOW (ref >59)

## 2020-07-26 LAB — HEPATIC FUNCTION PANEL
ALT: 42 IU/L (ref 0–44)
AST: 169 IU/L — ABNORMAL HIGH (ref 0–40)
Albumin: 4.2 g/dL (ref 3.8–4.8)
Alkaline Phosphatase: 112 IU/L (ref 44–121)
Bilirubin Total: 2.7 mg/dL — ABNORMAL HIGH (ref 0.0–1.2)
Bilirubin, Direct: 0.91 mg/dL — ABNORMAL HIGH (ref 0.00–0.40)
Total Protein: 9.2 g/dL — ABNORMAL HIGH (ref 6.0–8.5)

## 2020-07-26 LAB — PRO B NATRIURETIC PEPTIDE: NT-Pro BNP: 8332 pg/mL — ABNORMAL HIGH (ref 0–376)

## 2020-07-27 ENCOUNTER — Other Ambulatory Visit (HOSPITAL_COMMUNITY): Payer: Self-pay | Admitting: Internal Medicine

## 2020-07-28 ENCOUNTER — Telehealth: Payer: Self-pay | Admitting: *Deleted

## 2020-07-28 DIAGNOSIS — I5032 Chronic diastolic (congestive) heart failure: Secondary | ICD-10-CM

## 2020-07-28 MED ORDER — FUROSEMIDE 40 MG PO TABS: ORAL_TABLET | ORAL | 3 refills | Status: DC

## 2020-07-28 NOTE — Telephone Encounter (Signed)
-----   Message from Jolaine Artist, MD sent at 07/27/2020  7:43 PM EDT ----- Agree. We can ss him in [facility] Clinic soon if not already scheduled   ----- Message ----- From: Belva Crome, MD Sent: 07/27/2020   2:58 PM EDT To: Jolaine Artist, MD, Loren Racer, RN, #  Let the patient know the blood shows slight dehydration. Change Furosemide to 40 mg alternated with 20 mg daily unless otherwise instructed by Dr, Haroldine Laws. BMET in 7 days. A copy will be sent to Doristine Mango L, DO

## 2020-07-28 NOTE — Telephone Encounter (Signed)
Spoke with pt and reviewed results and recommendations per Dr. Tamala Julian.  Pt will come for labs on 5/3.  He is agreeable to appt with Dr. Haroldine Laws.  Advised I will send message to their team to get him scheduled.

## 2020-07-29 NOTE — Telephone Encounter (Signed)
appt scheduled

## 2020-08-04 ENCOUNTER — Ambulatory Visit (INDEPENDENT_AMBULATORY_CARE_PROVIDER_SITE_OTHER): Payer: Medicare HMO

## 2020-08-04 ENCOUNTER — Ambulatory Visit: Payer: Medicare HMO | Admitting: Podiatry

## 2020-08-04 ENCOUNTER — Encounter: Payer: Self-pay | Admitting: Podiatry

## 2020-08-04 ENCOUNTER — Other Ambulatory Visit: Payer: Self-pay

## 2020-08-04 DIAGNOSIS — M2042 Other hammer toe(s) (acquired), left foot: Secondary | ICD-10-CM

## 2020-08-04 DIAGNOSIS — M2041 Other hammer toe(s) (acquired), right foot: Secondary | ICD-10-CM

## 2020-08-04 DIAGNOSIS — L84 Corns and callosities: Secondary | ICD-10-CM | POA: Diagnosis not present

## 2020-08-05 ENCOUNTER — Other Ambulatory Visit: Payer: Self-pay | Admitting: Physician Assistant

## 2020-08-05 ENCOUNTER — Ambulatory Visit (INDEPENDENT_AMBULATORY_CARE_PROVIDER_SITE_OTHER): Payer: Medicare HMO | Admitting: *Deleted

## 2020-08-05 ENCOUNTER — Telehealth: Payer: Self-pay | Admitting: Physician Assistant

## 2020-08-05 ENCOUNTER — Other Ambulatory Visit: Payer: Medicare HMO | Admitting: *Deleted

## 2020-08-05 DIAGNOSIS — I5032 Chronic diastolic (congestive) heart failure: Secondary | ICD-10-CM

## 2020-08-05 DIAGNOSIS — E876 Hypokalemia: Secondary | ICD-10-CM

## 2020-08-05 DIAGNOSIS — Z5181 Encounter for therapeutic drug level monitoring: Secondary | ICD-10-CM

## 2020-08-05 DIAGNOSIS — I059 Rheumatic mitral valve disease, unspecified: Secondary | ICD-10-CM | POA: Diagnosis not present

## 2020-08-05 LAB — POCT INR: INR: 1.7 — AB (ref 2.0–3.0)

## 2020-08-05 NOTE — Patient Instructions (Signed)
Description   Take 1.5 tablet of warfarin today and then continue to take 1 tablet daily except for 1/2 a tablet on Sunday, Tuesday and Thursday. Recheck INR in 2 weeks. Coumadin Clinic [phone].

## 2020-08-05 NOTE — Progress Notes (Signed)
Subjective:   Patient ID: [name], male   DOB: 70 y.o.   MRN: [id number]   HPI Patient presents with a very painful digital deformity fourth left states its been present for several weeks and he does not well what happened.  Does not know any injury but states its very sore to walk on and it is achy.  Patient's not been seen for a number of years does not smoke likes to be active   Review of Systems  All other systems reviewed and are negative.       Objective:  Physical Exam Vitals and nursing note reviewed.  Constitutional:      Appearance: He is well-developed.  Pulmonary:     Effort: Pulmonary effort is normal.  Musculoskeletal:        General: Normal range of motion.  Skin:    General: Skin is warm.  Neurological:     Mental Status: He is alert.     Neurovascular status intact muscle strength was found to be adequate range of motion adequate.  Patient is found to have distal keratotic lesion digit 4 left with structural deformity of the toe with the toe pressing down in a distal direction creating a reactive force.  Patient has good digital perfusion well oriented x3     Assessment:  Hammertoe deformity fourth digit left foot with keratotic lesion distal secondary to structural pressure     Plan:  H&P x-ray reviewed condition discussed.  Today sterile sharp debridement accomplished no iatrogenic bleeding and I applied a buttress pad to elevate the toe and discussed possible digital fusion if symptoms remain encouraging and discussing what would be required and what recovery would look  X-rays indicate significant structural digital deformity bilateral no indications of osteolysis

## 2020-08-05 NOTE — Telephone Encounter (Signed)
   Rec'd a call from [facility] about a K+ 2.8 on blood draw today. BUN/Cr still above baseline.  Called pt and spoke w/ him.  He has not taken his Kdur 20 meq in a long time.   Is currently on Lasix 40/20 mg alternate days.  Current wt at office today, 128 lbs. He has not been this thin in a long time. Appetite is not great but he is eating.   He is drinking Ensure daily, cannot do BID due to amt of Vit K in it.  Plan:  Decrease Lasix further, to 40 mg qd Let us know if he starts to gain fluid.  Take Kdur 20 meq x 2 tonight and in am. Take Kdur 20 meq every time he takes the Lasix.  Try to eat 3 meals a day and take Ensure daily, plus a protein bar for snacks.  Discuss wt loss w/ his PCP when he sees her.  Pt repeated instructions and will do as asked.  Recheck BMET at next coumadin check 05/25.    BMP Latest Ref Rng & Units 08/05/2020 07/25/2020 06/16/2020  Glucose 65 - 99 mg/dL 117(H) 87 109(H)  BUN 8 - 27 mg/dL 47(H) 50(H) 39(H)  Creatinine 0.76 - 1.27 mg/dL 2.47(H) 2.56(H) 2.45(H)  BUN/Creat Ratio 10 - 24 19 20 16   Sodium 134 - 144 mmol/L 136 138 136  Potassium 3.5 - 5.2 mmol/L 2.8(LL) 3.4(L) 4.1  Chloride 96 - 106 mmol/L 94(L) 94(L) 96  CO2 20 - 29 mmol/L 25 24 18(L)  Calcium 8.6 - 10.2 mg/dL 9.3 9.2 9.4   Talynn Lebon, PA-C 08/05/2020 5:56 PM

## 2020-08-06 ENCOUNTER — Other Ambulatory Visit (HOSPITAL_COMMUNITY): Payer: Self-pay

## 2020-08-06 ENCOUNTER — Telehealth: Payer: Self-pay | Admitting: Interventional Cardiology

## 2020-08-06 ENCOUNTER — Encounter: Payer: Self-pay | Admitting: *Deleted

## 2020-08-06 LAB — BASIC METABOLIC PANEL
BUN/Creatinine Ratio: 19 (ref 10–24)
BUN: 47 mg/dL — ABNORMAL HIGH (ref 8–27)
CO2: 25 mmol/L (ref 20–29)
Calcium: 9.3 mg/dL (ref 8.6–10.2)
Chloride: 94 mmol/L — ABNORMAL LOW (ref 96–106)
Creatinine, Ser: 2.47 mg/dL — ABNORMAL HIGH (ref 0.76–1.27)
Glucose: 117 mg/dL — ABNORMAL HIGH (ref 65–99)
Potassium: 2.8 mmol/L — CL (ref 3.5–5.2)
Sodium: 136 mmol/L (ref 134–144)
eGFR: 28 mL/min/{1.73_m2} — ABNORMAL LOW (ref >59)

## 2020-08-06 MED ORDER — SPIRONOLACTONE 25 MG PO TABS
ORAL_TABLET | ORAL | 3 refills | Status: DC
Start: 2020-08-06 — End: 2020-12-01

## 2020-08-06 NOTE — Telephone Encounter (Signed)
Spoke with rep from [facility] and they were calling to report a K+ of 2.8.  Advised someone called the on call last night and this has been addressed.

## 2020-08-06 NOTE — Telephone Encounter (Signed)
New Message:     [name] have a Critical Lab Result

## 2020-08-07 ENCOUNTER — Ambulatory Visit (INDEPENDENT_AMBULATORY_CARE_PROVIDER_SITE_OTHER): Payer: Medicare HMO

## 2020-08-07 VITALS — Ht 73.0 in | Wt 128.0 lb

## 2020-08-07 DIAGNOSIS — Z0000 Encounter for general adult medical examination without abnormal findings: Secondary | ICD-10-CM | POA: Diagnosis not present

## 2020-08-07 NOTE — Progress Notes (Addendum)
Subjective:   [name] is a 70 y.o. male who presents for Medicare Annual/Subsequent preventive examination.  The patient consented to a virtual visit. Patient consented to have virtual visit and was identified by name and date of birth. Method of visit: Telephone   Encounter participants: Patient: TAISON CELANI - located at Home Nurse/Provider: Dorna Bloom - located at [facility] Others (if applicable): NA  Review of Systems: Defer to PCP  Cardiac Risk Factors include: advanced age (>69men, >40 women)  Objective:    Vitals: Ht 6\' 1"  (1.854 m)   Wt 128 lb (58.1 kg) Comment: Patient reported- virtual visit  BMI 16.89 kg/m   Body mass index is 16.89 kg/m.  Advanced Directives 08/07/2020 06/16/2020 05/07/2020 11/27/2019 11/26/2019 11/19/2019 10/23/2019  Does Patient Have a Medical Advance Directive? No No No No No Yes No  Type of Advance Directive - - - - - Press photographer;Living will -  Does patient want to make changes to medical advance directive? - - - - No - Patient declined - -  Copy of [place] in Chart? - - - - - No - copy requested -  Would patient like information on creating a medical advance directive? No - Patient declined No - Patient declined Yes (Inpatient - patient defers creating a medical advance directive at this time - Information given) No - Patient declined No - Patient declined - No - Patient declined   Tobacco Social History   Tobacco Use  Smoking Status Former Smoker  . Types: Cigars  . Quit date: 09/03/2017  . Years since quitting: 2.9  Smokeless Tobacco Never Used     Counseling given: No plans to restart  Clinical Intake:  Pre-visit preparation completed: Yes  Pain Score: 5   How often do you need to have someone help you when you read instructions, pamphlets, or other written materials from your doctor or pharmacy?: 2 - Rarely What is the last grade level you completed in school?: High School  Interpreter  Needed?: No   Past Medical History:  Diagnosis Date  . Allergy   . Aneurysm of ascending aorta (HCC)   . Atrial fibrillation ([place])   . BPH (benign prostatic hyperplasia)   . Colon polyps   . FHx: rheumatic heart disease   . H/O diplopia   . Hepatitis C   . Hypertension   . Hypogonadism male   . Mitral valve disease    mitral valve repair/replacement x3   Past Surgical History:  Procedure Laterality Date  . BIOPSY  11/19/2019   Procedure: BIOPSY;  Surgeon: Yetta Flock, MD;  Location: [facility];  Service: Gastroenterology;;  EGD and COLON  . COLON SURGERY    . COLONOSCOPY    . COLONOSCOPY N/A 11/14/2012   Procedure: COLONOSCOPY;  Surgeon: Lear Ng, MD;  Location: [facility];  Service: Endoscopy;  Laterality: N/A;  . COLONOSCOPY WITH PROPOFOL N/A 11/19/2019   Procedure: COLONOSCOPY WITH PROPOFOL;  Surgeon: Yetta Flock, MD;  [facility];  Service: Gastroenterology;  Laterality: N/A;  . COLONOSCOPY WITH PROPOFOL N/A 11/27/2019   Procedure: COLONOSCOPY WITH PROPOFOL;  Surgeon: Doran Stabler, MD;  [facility];  Service: Gastroenterology;  Laterality: N/A;  . ESOPHAGOGASTRODUODENOSCOPY (EGD) WITH PROPOFOL N/A 05/08/2014   Procedure: ESOPHAGOGASTRODUODENOSCOPY (EGD) WITH PROPOFOL;  Surgeon: Lear Ng, MD;  Location: [place];  Service: Endoscopy;  Laterality: N/A;  . ESOPHAGOGASTRODUODENOSCOPY (EGD) WITH PROPOFOL N/A 11/19/2019  Procedure: ESOPHAGOGASTRODUODENOSCOPY (EGD) WITH PROPOFOL;  Surgeon: Yetta Flock, MD;  [facility];  Service: Gastroenterology;  Laterality: N/A;  . ESOPHAGOGASTRODUODENOSCOPY (EGD) WITH PROPOFOL N/A 05/07/2020   Procedure: ESOPHAGOGASTRODUODENOSCOPY (EGD) WITH PROPOFOL;  Surgeon: Yetta Flock, MD;  [facility];  Service: Gastroenterology;  Laterality: N/A;  . HEMOSTASIS CLIP PLACEMENT  11/19/2019   Procedure: HEMOSTASIS CLIP PLACEMENT;  Surgeon: Yetta Flock, MD;  [facility];  Service: Gastroenterology;;  . HEMOSTASIS CLIP PLACEMENT  11/27/2019   Procedure: HEMOSTASIS CLIP PLACEMENT;  Surgeon: Doran Stabler, MD;  [facility];  Service: Gastroenterology;;  . HEMOSTASIS CLIP PLACEMENT  05/07/2020   Procedure: HEMOSTASIS CLIP PLACEMENT;  Surgeon: Yetta Flock, MD;  [facility];  Service: Gastroenterology;;  . MITRAL VALVE REPLACEMENT     1973 (bioprosthesis), 1988 (St Jude mechanical vlave) , 2006 (repair due to leak)  . POLYPECTOMY  11/19/2019   Procedure: POLYPECTOMY;  Surgeon: Yetta Flock, MD;  Location[facility];  Service: Gastroenterology;;  . POLYPECTOMY  05/07/2020   Procedure: POLYPECTOMY;  Surgeon: Yetta Flock, MD;  Location: [facility];  Service: Gastroenterology;;  . RIGHT HEART CATH N/A 11/24/2018   Procedure: RIGHT HEART CATH;  Surgeon: Jolaine Artist, MD;  Location: [facility];  Service: Cardiovascular;  Laterality: N/A;  . RIGHT HEART CATH N/A 10/23/2019   Procedure: RIGHT HEART CATH;  Surgeon: Jolaine Artist, MD;  Location: [facility];  Service: Cardiovascular;  Laterality: N/A;  . SUBMUCOSAL LIFTING INJECTION  05/07/2020   Procedure: SUBMUCOSAL LIFTING INJECTION;  Surgeon: Yetta Flock, MD;  [facility];  Service: Gastroenterology;;  . UPPER GASTROINTESTINAL ENDOSCOPY     02-2010 and 05-2014   Family History  Family history unknown: Yes   Social History   Socioeconomic History  . Marital status: Divorced    Spouse name: Not on file  . Number of children: 2  . Years of education: 61  . Highest education level: High school graduate  Occupational History  . Not on file  Tobacco Use  . Smoking status: Former Smoker    Types: Cigars    Quit date: 09/03/2017    Years since quitting: 2.9  . Smokeless tobacco: Never Used  Vaping Use  . Vaping Use: Never used  Substance and Sexual Activity  . Alcohol use: No  .  Drug use: No  . Sexual activity: Not Currently  Other Topics Concern  . Not on file  Social History Narrative   Patient lives alone in [place].    Patient works out 4x a week on home exercise bike.    Patient plays guitar.    Social Determinants of Health   Financial Resource Strain: Low Risk   . Difficulty of Paying Living Expenses: Not hard at all  Food Insecurity: No Food Insecurity  . Worried About Charity fundraiser in the Last Year: Never true  . Ran Out of Food in the Last Year: Never true  Transportation Needs: No Transportation Needs  . Lack of Transportation (Medical): No  . Lack of Transportation (Non-Medical): No  Physical Activity: Insufficiently Active  . Days of Exercise per Week: 4 days  . Minutes of Exercise per Session: 20 min  Stress: No Stress Concern Present  . Feeling of Stress : Only a little  Social Connections: Socially Isolated  . Frequency of Communication with Friends and Family: More than three times a week  . Frequency of Social  Gatherings with Friends and Family: More than three times a week  . Attends Religious Services: Never  . Active Member of Clubs or Organizations: No  . Attends Archivist Meetings: Never  . Marital Status: Divorced   Outpatient Encounter Medications as of 08/07/2020  Medication Sig  . amoxicillin (AMOXIL) 500 MG capsule TAKE 4 CAPSULES BY MOUTH 1 HOUR BEFORE DENTAL APPOINTMENT  . ferrous sulfate 325 (65 FE) MG tablet Take 1 tablet (325 mg total) by mouth every other day. In the evening. (Patient taking differently: Take 325 mg by mouth every other day.)  . furosemide (LASIX) 40 MG tablet TAKE 1 TABLET(40 MG) BY MOUTH DAILY  . furosemide (LASIX) 40 MG tablet Take one tablet by mouth every other day, alternating with taking a half tablet (20mg  total) every other day (Patient taking differently: 20 mg. Take one tablet by mouth every other day, alternating with taking a half tablet (20mg  total) every other day)  .  metolazone (ZAROXOLYN) 2.5 MG tablet Take as directed by the heart failure clinic (Patient taking differently: Take 2.5 mg by mouth daily as needed (Swelling). Take as directed by the heart failure clinic)  . polyethylene glycol (MIRALAX) 17 g packet Take 17 g by mouth daily as needed. (Patient taking differently: Take 17 g by mouth daily.)  . potassium chloride SA (KLOR-CON) 20 MEQ tablet Take 1 tablet (20 mEq total) by mouth daily.  . sildenafil (REVATIO) 20 MG tablet Take 20 mg by mouth daily as needed (ED).  Marland Kitchen spironolactone (ALDACTONE) 25 MG tablet TAKE 1/2 TABLET(12.5 MG) BY MOUTH DAILY  . Testosterone 1.62 % GEL Apply 2 Pump topically daily. 1 pump on each arm  . warfarin (COUMADIN) 5 MG tablet TAKE 1/2 TABLE BY MOUTH THURSDAY AND SATURDAY. THEN TAKE ONE TABLET BY MOUTH SUNDAY, MONDAY, [place], [place], AND FRIDAY  . Efinaconazole 10 % SOLN Apply to toenail daily for 48 weeks (Patient not taking: Reported on 08/07/2020)  . enoxaparin (LOVENOX) 60 MG/0.6ML injection Inject 0.6 mLs (60 mg total) into the skin daily. (Patient not taking: Reported on 08/07/2020)  . pantoprazole (PROTONIX) 40 MG tablet Take 1 tablet (40 mg total) by mouth 2 (two) times daily. (Patient not taking: Reported on 08/07/2020)   No facility-administered encounter medications on file as of 08/07/2020.   Activities of Daily Living In your present state of health, do you have any difficulty performing the following activities: 08/07/2020 05/08/2020  Hearing? N N  Vision? N N  Difficulty concentrating or making decisions? N N  Walking or climbing stairs? N N  Dressing or bathing? N N  Doing errands, shopping? N N  Preparing Food and eating ? N -  Using the Toilet? N -  In the past six months, have you accidently leaked urine? Y -  Comment Fluid pills -  Do you have problems with loss of bowel control? N -  Managing your Medications? N -  Managing your Finances? N -  Housekeeping or managing your Housekeeping? N -  Some  recent data might be hidden   Patient Care Team: [name], DO as PCP - General (Family Medicine) Belva Crome, MD as PCP - Cardiology (Cardiology) Bensimhon, Shaune Pascal, MD as PCP - Advanced Heart Failure (Cardiology)   Assessment:   This is a routine wellness examination for Cassandra.  Exercise Activities and Dietary recommendations Current Exercise Habits: Home exercise routine, Time (Minutes): 20, Frequency (Times/Week): 4, Weekly Exercise (Minutes/Week): 80, Exercise limited by: cardiac condition(s)  Goals    . Gain weight      Fall Risk Fall Risk  08/07/2020 06/16/2020 01/25/2019 06/07/2018 03/14/2018  Falls in the past year? 0 0 0 0 0  Number falls in past yr: - 0 0 0 -  Injury with Fall? - 0 - 0 -  Follow up Falls prevention discussed - Falls evaluation completed - -   Is the patient's home free of loose throw rugs in walkways, pet beds, electrical cords, etc?   yes      Grab bars in the bathroom? yes      Handrails on the stairs?   yes      Adequate lighting?   yes  Patient rating of health (0-10): 6   Depression Screen PHQ 2/9 Scores 08/07/2020 06/16/2020 01/25/2019 03/14/2018  PHQ - 2 Score 1 1 0 0  PHQ- 9 Score - 6 - -   Cognitive Function  6CIT Screen 08/07/2020  What Year? 0 points  What month? 0 points  What time? 0 points  Count back from 20 0 points  Months in reverse 0 points  Repeat phrase 0 points  Total Score 0   Immunization History  Administered Date(s) Administered  . Fluad Quad(high Dose 65+) 01/25/2019  . Hep A / Hep B 01/01/2013  . Influenza,inj,Quad PF,6+ Mos 02/15/2014  . Influenza-Unspecified 01/25/2019  . Janssen (J&J) SARS-COV-2 Vaccination 06/22/2019  . PFIZER Comirnaty(Gray Top)Covid-19 Tri-Sucrose Vaccine 03/18/2020   Screening Tests Health Maintenance  Topic Date Due  . TETANUS/TDAP  Never done  . PNA vac Low Risk Adult (1 of 2 - PCV13) Never done  . INFLUENZA VACCINE  11/03/2020  . COLONOSCOPY (Pts 45-49yrs Insurance  coverage will need to be confirmed)  11/26/2029  . COVID-19 Vaccine  Completed  . Hepatitis C Screening  Completed  . HPV VACCINES  Aged Out   Cancer Screenings: Lung: Low Dose CT Chest recommended if Age 47-80 years, 30 pack-year currently smoking OR have quit w/in 15years. Patient had CT Chest June 2020. Colorectal: UTD  Additional Screenings: Hepatitis C Screening: Completed  HIV Screening: Completed   Plan:  You are eligible for #2 Covid Booster.  We have [place] here at [facility]. Fill out an advance directive, I have placed packet it the mail as discussed.  Continue exercise as tolerated.   I have personally reviewed and noted the following in the patient's chart:   . Medical and social history . Use of alcohol, tobacco or illicit drugs  . Current medications and supplements . Functional ability and status . Nutritional status . Physical activity . Advanced directives . List of other physicians . Hospitalizations, surgeries, and ER visits in previous 12 months . Vitals . Screenings to include cognitive, depression, and falls . Referrals and appointments  In addition, I have reviewed and discussed with patient certain preventive protocols, quality metrics, and best practice recommendations. A written personalized care plan for preventive services as well as general preventive health recommendations were provided to patient.  This visit was conducted virtually in the setting of the [place] pandemic.   Dorna Bloom, [place]  08/07/2020   I have reviewed this visit and agree with the documentation.   Doristine Mango, D.O. PGY-3 [facility]  I have reviewed this visit and agree with the documentation.  Dorris Singh, MD  Family Medicine Teaching Service

## 2020-08-07 NOTE — Patient Instructions (Addendum)
You spoke to [name], [place] over the phone for your annual wellness visit.  We discussed goals: Goals    . Gain weight      We also discussed recommended health maintenance. Please call our office and schedule a visit. As discussed, you are pretty much up to date with everything!   Health Maintenance  Topic Date Due  . TETANUS/TDAP  Never done  . PNA vac Low Risk Adult (1 of 2 - PCV13) Never done  . INFLUENZA VACCINE  11/03/2020  . COLONOSCOPY (Pts 45-66yrs Insurance coverage will need to be confirmed)  11/26/2029  . COVID-19 Vaccine  Completed  . Hepatitis C Screening  Completed  . HPV VACCINES  Aged Out   You are eligible for #2 Covid Booster.  We have [place] here at [facility]. Fill out an advance directive, I have placed packet it the mail as discussed.  Continue exercise as tolerated.   Preventive Care 68 Years and Older, Male Preventive care refers to lifestyle choices and visits with your health care provider that can promote health and wellness. This includes:  A yearly physical exam. This is also called an annual wellness visit.  Regular dental and eye exams.  Immunizations.  Screening for certain conditions.  Healthy lifestyle choices, such as: ? Eating a healthy diet. ? Getting regular exercise. ? Not using drugs or products that contain nicotine and tobacco. ? Limiting alcohol use. What can I expect for my preventive care visit? Physical exam Your health care provider will check your:  Height and weight. These may be used to calculate your BMI (body mass index). BMI is a measurement that tells if you are at a healthy weight.  Heart rate and blood pressure.  Body temperature.  Skin for abnormal spots. Counseling Your health care provider may ask you questions about your:  Past medical problems.  Family's medical history.  Alcohol, tobacco, and drug use.  Emotional well-being.  Home life and relationship well-being.  Sexual activity.  Diet,  exercise, and sleep habits.  History of falls.  Memory and ability to understand (cognition).  Work and work Statistician.  Access to firearms. What immunizations do I need? Vaccines are usually given at various ages, according to a schedule. Your health care provider will recommend vaccines for you based on your age, medical history, and lifestyle or other factors, such as travel or where you work.   What tests do I need? Blood tests  Lipid and cholesterol levels. These may be checked every 5 years, or more often depending on your overall health.  Hepatitis C test.  Hepatitis B test. Screening  Lung cancer screening. You may have this screening every year starting at age 85 if you have a 30-pack-year history of smoking and currently smoke or have quit within the past 15 years.  Colorectal cancer screening. ? All adults should have this screening starting at age 97 and continuing until age 43. ? Your health care provider may recommend screening at age 66 if you are at increased risk. ? You will have tests every 1-10 years, depending on your results and the type of screening test.  Prostate cancer screening. Recommendations will vary depending on your family history and other risks.  Genital exam to check for testicular cancer or hernias.  Diabetes screening. ? This is done by checking your blood sugar (glucose) after you have not eaten for a while (fasting). ? You may have this done every 1-3 years.  Abdominal aortic aneurysm (AAA)  screening. You may need this if you are a current or former smoker.  STD (sexually transmitted disease) testing, if you are at risk. Follow these instructions at home: Eating and drinking  Eat a diet that includes fresh fruits and vegetables, whole grains, lean protein, and low-fat dairy products. Limit your intake of foods with high amounts of sugar, saturated fats, and salt.  Take vitamin and mineral supplements as recommended by your health  care provider.  Do not drink alcohol if your health care provider tells you not to drink.  If you drink alcohol: ? Limit how much you have to 0-2 drinks a day. ? Be aware of how much alcohol is in your drink. In the [place] one drink equals one 12 oz bottle of beer (355 mL), one 5 oz glass of wine (148 mL), or one 1 oz glass of hard liquor (44 mL).   Lifestyle  Take daily care of your teeth and gums. Brush your teeth every morning and night with fluoride toothpaste. Floss one time each day.  Stay active. Exercise for at least 30 minutes 5 or more days each week.  Do not use any products that contain nicotine or tobacco, such as cigarettes, e-cigarettes, and chewing tobacco. If you need help quitting, ask your health care provider.  Do not use drugs.  If you are sexually active, practice safe sex. Use a condom or other form of protection to prevent STIs (sexually transmitted infections).  Talk with your health care provider about taking a low-dose aspirin or statin.  Find healthy ways to cope with stress, such as: ? Meditation, yoga, or listening to music. ? Journaling. ? Talking to a trusted person. ? Spending time with friends and family. Safety  Always wear your seat belt while driving or riding in a vehicle.  Do not drive: ? If you have been drinking alcohol. Do not ride with someone who has been drinking. ? When you are tired or distracted. ? While texting.  Wear a helmet and other protective equipment during sports activities.  If you have firearms in your house, make sure you follow all gun safety procedures. What's next?  Visit your health care provider once a year for an annual wellness visit.  Ask your health care provider how often you should have your eyes and teeth checked.  Stay up to date on all vaccines. This information is not intended to replace advice given to you by your health care provider. Make sure you discuss any questions you have with your health  care provider. Document Revised: 12/19/2018 Document Reviewed: 03/16/2018 Elsevier Patient Education  2021 [place].   Our clinic's number is [phone]. Please call with questions or concerns about what we discussed today.

## 2020-08-20 ENCOUNTER — Other Ambulatory Visit: Payer: Self-pay | Admitting: Student in an Organized Health Care Education/Training Program

## 2020-08-20 ENCOUNTER — Other Ambulatory Visit: Payer: Medicare HMO

## 2020-08-21 ENCOUNTER — Other Ambulatory Visit: Payer: Medicare HMO

## 2020-08-21 ENCOUNTER — Ambulatory Visit (INDEPENDENT_AMBULATORY_CARE_PROVIDER_SITE_OTHER): Payer: Medicare HMO | Admitting: *Deleted

## 2020-08-21 ENCOUNTER — Other Ambulatory Visit: Payer: Self-pay

## 2020-08-21 DIAGNOSIS — E876 Hypokalemia: Secondary | ICD-10-CM

## 2020-08-21 DIAGNOSIS — I4892 Unspecified atrial flutter: Secondary | ICD-10-CM | POA: Diagnosis not present

## 2020-08-21 DIAGNOSIS — Z5181 Encounter for therapeutic drug level monitoring: Secondary | ICD-10-CM

## 2020-08-21 DIAGNOSIS — I059 Rheumatic mitral valve disease, unspecified: Secondary | ICD-10-CM | POA: Diagnosis not present

## 2020-08-21 LAB — BASIC METABOLIC PANEL
BUN/Creatinine Ratio: 17 (ref 10–24)
BUN: 45 mg/dL — ABNORMAL HIGH (ref 8–27)
CO2: 23 mmol/L (ref 20–29)
Calcium: 9.1 mg/dL (ref 8.6–10.2)
Chloride: 100 mmol/L (ref 96–106)
Creatinine, Ser: 2.6 mg/dL — ABNORMAL HIGH (ref 0.76–1.27)
Glucose: 107 mg/dL — ABNORMAL HIGH (ref 65–99)
Potassium: 3.9 mmol/L (ref 3.5–5.2)
Sodium: 136 mmol/L (ref 134–144)
eGFR: 26 mL/min/{1.73_m2} — ABNORMAL LOW (ref >59)

## 2020-08-21 LAB — POCT INR: INR: 2.8 (ref 2.0–3.0)

## 2020-08-21 NOTE — Patient Instructions (Signed)
Description   Continue taking Warfarin 1 tablet daily except for 1/2 tablet on Sunday, Tuesday and Thursday. Recheck INR in 3 weeks. Coumadin Clinic [phone].

## 2020-08-22 ENCOUNTER — Other Ambulatory Visit: Payer: Self-pay | Admitting: Student in an Organized Health Care Education/Training Program

## 2020-08-26 ENCOUNTER — Other Ambulatory Visit: Payer: Self-pay

## 2020-08-26 DIAGNOSIS — K7460 Unspecified cirrhosis of liver: Secondary | ICD-10-CM

## 2020-08-26 NOTE — Progress Notes (Unsigned)
Order for RUQ ultrasound placed.  Staff message to April and Vivien Rota in scheduling sent

## 2020-08-27 ENCOUNTER — Ambulatory Visit (HOSPITAL_COMMUNITY)
Admission: RE | Admit: 2020-08-27 | Discharge: 2020-08-27 | Disposition: A | Discharge status: Home / Self Care | Payer: Medicare HMO | Source: Ambulatory Visit | Attending: Cardiology | Admitting: Cardiology

## 2020-08-27 ENCOUNTER — Other Ambulatory Visit: Payer: Self-pay

## 2020-08-27 ENCOUNTER — Encounter (HOSPITAL_COMMUNITY): Payer: Self-pay

## 2020-08-27 VITALS — BP 92/64 | HR 91 | Wt 132.4 lb

## 2020-08-27 DIAGNOSIS — Z952 Presence of prosthetic heart valve: Secondary | ICD-10-CM | POA: Diagnosis not present

## 2020-08-27 DIAGNOSIS — I4892 Unspecified atrial flutter: Secondary | ICD-10-CM | POA: Diagnosis not present

## 2020-08-27 DIAGNOSIS — I712 Thoracic aortic aneurysm, without rupture: Secondary | ICD-10-CM | POA: Insufficient documentation

## 2020-08-27 DIAGNOSIS — N184 Chronic kidney disease, stage 4 (severe): Secondary | ICD-10-CM | POA: Diagnosis not present

## 2020-08-27 DIAGNOSIS — I5032 Chronic diastolic (congestive) heart failure: Secondary | ICD-10-CM | POA: Insufficient documentation

## 2020-08-27 DIAGNOSIS — Z7901 Long term (current) use of anticoagulants: Secondary | ICD-10-CM | POA: Insufficient documentation

## 2020-08-27 DIAGNOSIS — I272 Pulmonary hypertension, unspecified: Secondary | ICD-10-CM | POA: Insufficient documentation

## 2020-08-27 DIAGNOSIS — B1920 Unspecified viral hepatitis C without hepatic coma: Secondary | ICD-10-CM | POA: Diagnosis not present

## 2020-08-27 DIAGNOSIS — R69 Illness, unspecified: Secondary | ICD-10-CM | POA: Diagnosis not present

## 2020-08-27 DIAGNOSIS — I130 Hypertensive heart and chronic kidney disease with heart failure and stage 1 through stage 4 chronic kidney disease, or unspecified chronic kidney disease: Secondary | ICD-10-CM | POA: Insufficient documentation

## 2020-08-27 DIAGNOSIS — Z79899 Other long term (current) drug therapy: Secondary | ICD-10-CM | POA: Insufficient documentation

## 2020-08-27 DIAGNOSIS — Z87891 Personal history of nicotine dependence: Secondary | ICD-10-CM | POA: Insufficient documentation

## 2020-08-27 NOTE — Progress Notes (Signed)
ReDS Vest / Clip - 08/27/20 0900      ReDS Vest / Clip   Station Marker C    Ruler Value 29    ReDS Value Range Low volume    ReDS Actual Value 33    Anatomical Comments sitting

## 2020-08-27 NOTE — Addendum Note (Signed)
Encounter addended by: Kerry Dory, CMA on: 08/27/2020 4:13 PM  Actions taken: Order list changed, Diagnosis association updated

## 2020-08-27 NOTE — Patient Instructions (Signed)
It was great to see you today! No medication changes are needed at this time.  Your physician has requested that you have an echocardiogram. Echocardiography is a painless test that uses sound waves to create images of your heart. It provides your doctor with information about the size and shape of your heart and how well your heart's chambers and valves are working. This procedure takes approximately one hour. There are no restrictions for this procedure.   Your physician has recommended that you have a sleep study. This test records several body functions during sleep, including: brain activity, eye movement, oxygen and carbon dioxide blood levels, heart rate and rhythm, breathing rate and rhythm, the flow of air through your mouth and nose, snoring, body muscle movements, and chest and belly movement.   Your physician recommends that you schedule a follow-up appointment in: 3-4 months with Dr Haroldine Laws  Do the following things EVERYDAY: 1) Weigh yourself in the morning before breakfast. Write it down and keep it in a log. 2) Take your medicines as prescribed 3) Eat low salt foods--Limit salt (sodium) to 2000 mg per day.  4) Stay as active as you can everyday 5) Limit all fluids for the day to less than 2 liters  At the [facility], you and your health needs are our priority. As part of our continuing mission to provide you with exceptional heart care, we have created designated Provider Care Teams. These Care Teams include your primary Cardiologist (physician) and Advanced Practice Providers (APPs- Physician Assistants and Nurse Practitioners) who all work together to provide you with the care you need, when you need it.   You may see any of the following providers on your designated Care Team at your next follow up: Marland Kitchen Dr Glori Bickers . Dr Loralie Champagne . Dr Vickki Muff . Darrick Grinder, NP . Lyda Jester, [place] . Audry Riles,  PharmD   Please be sure to bring in all your medications bottles to every appointment.   If you have any questions or concerns before your next appointment please send [place] a message through [place] or call our office at [phone].    TO LEAVE A MESSAGE FOR THE NURSE SELECT OPTION 2, PLEASE LEAVE A MESSAGE INCLUDING: . YOUR NAME . DATE OF BIRTH . CALL BACK NUMBER . REASON FOR CALL**this is important as we prioritize the call backs  YOU WILL RECEIVE A CALL BACK THE SAME DAY AS LONG AS YOU CALL BEFORE 4:00 PM

## 2020-08-27 NOTE — Progress Notes (Signed)
ADVANCED HF CLINIC NOTE   Primary Cardiologist: Dr. Daneen Schick [facility]: Dr. Haroldine Laws   HPI:  [name] is a 70 y.o. male who was initially referred by Dr. Tamala Julian for evalaution of Pulmoanry HTN.   He has a hx ofrheumatic heart disease with history of mitral valve replacement 3 (porcine 1973; mechanical 1988; and mechanical 2012), history of paravalvular leak with hemolysis leading to the 2012 replacement. Other problems include hepatitis C (s/p curative therapy), chronic atrial flutter, ascending aortic aneurysm 4.5 cm, chronic kidney disease stage III,chronic anemia.    CTA of chest 6/20 neg for PE but dilated main pulmonary artery at 4.5 cm. + emphysema. Ectatic ascending aorta 4.4 cm.   Echo 7/20 EF 60-65%, moderately increased LV wall thickness.  Mod LVH. RV with severely reducedsystolic function pressure moderately elevated at 54.9 mmHg mildly dilated aortic root 4.4 cm, mild AI s/p MVR with mean gradient of 5 , severe LAE, severe RV dysfunction mild TR estimated RVSP moderately elevated.    We saw him for the first time on 11/08/18. More SOB, more LE edema. NYHA IIIB symptoms. Underwent RHC to assess need for inotropes  RHC on 11/24/18  RA = 17 RV = 71/17 PA = 71/27 (45) PCW = 27 (v = 39) Fick cardiac output/index = 3.7/1.96 PVR = 5.0 WU Ao sat = 98% PA sat = 52%, 54% PaPI = 3.17  Repeat RHC 10/23/19  RA = 12 RV = 65/12 PA = 65/24 (41) PCW = 20 (v-wave 37) Fick cardiac output/index = 3.85/2.13 PVR = 5.5 FA sat = 98% PA sat = 52%, 54% PAPi = 3.4   Admitted 8/21 for GIB after recent polypectomy. Underwent EGD 8/16 with colonoscopy-found to have gastric polyps including sigmoid colon polyp 6 mm. Developed self-limiting bleed subsequent to this but 5 days later bleeding restarted despite holding Lovenox-found to have bright red blood per rectum. In ED patient was hypotensive-hemoglobin 10.2-->8.4 INR is 2.1  Repeat colonoscopy 11/27/2019 blood in sigmoid colon  clips placed at polypectomy sites. Got 3 units of blood.   Last seen in [facility] 9/21. At the time, endorsed NYHA Class III symptoms. Exam was notable for R> L HF. ReDS was 22% suggesting normal left-sided pressures but LE edema suggested he had some R-sided volume. Was encouraged to use compression hose and metolazone as needed (carefully). Not felt to be a candidate for advanced therapies. 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 Not transplant candidate w CKD 4. Not ideal candidate for palliative inotropes given infection risk with mechanical MV. He was ordered to get repeat echo but this was never completed.   05/07/20, he presented for outpatient EGD for polypectomy. Post procedure, the patient was altered and hypotensive and he required intubation for airway protection in the setting of acute hypercarbic (pH 7.16 / pCO2 68) respiratory failure.  Per report, he had ST-T wave abnormalities.  He was admitted to the ICU for observation and managed by PCCM.  He had a mild troponin leak that was felt to be demand ischemia in the setting of hypotension. He was ultimately weaned off vent and extubated. There was also concern for potential sleep apnea and sleep evaluation was recommended. Cardiology was consulted per family request but nothing further was added.   He now presents to [facility] for routine f/u. Still NYHA Class III. Struggles walking up stairs. Denies resting dyspnea. Has trace bilateral LEE and reports using PRN metolazone at least once weekly. Denies any recent melena/ hematochezia. ReDs  clip is 33%. BP is soft at 92/64 but no orthostatic symptoms. He has compression stockings at home but not currently wearing. Also of note, he just had blood work on 5/19. Results reviewed, SCr was 2.6 c/w baseline, K 3.9. He is interested in exercise and asking if ok to work-out at the gym.    Smoked < 1/2 ppd x many years. Quit 2-3 years ago  Past Medical History:  Diagnosis Date  . Allergy   . Aneurysm of ascending  aorta (HCC)   . Atrial fibrillation ([place])   . BPH (benign prostatic hyperplasia)   . Colon polyps   . FHx: rheumatic heart disease   . H/O diplopia   . Hepatitis C   . Hypertension   . Hypogonadism male   . Mitral valve disease    mitral valve repair/replacement x3    Current Outpatient Medications  Medication Sig Dispense Refill  . amoxicillin (AMOXIL) 500 MG capsule TAKE 4 CAPSULES BY MOUTH 1 HOUR BEFORE DENTAL APPOINTMENT 4 capsule 1  . Efinaconazole 10 % SOLN Apply to toenail daily for 48 weeks 8 mL 3  . ferrous sulfate 325 (65 FE) MG tablet Take 1 tablet (325 mg total) by mouth every other day. In the evening. (Patient taking differently: Take 325 mg by mouth every other day.)  3  . furosemide (LASIX) 40 MG tablet TAKE 1 TABLET(40 MG) BY MOUTH DAILY 30 tablet 3  . furosemide (LASIX) 40 MG tablet Take one tablet by mouth every other day, alternating with taking a half tablet (20mg  total) every other day (Patient taking differently: 20 mg. Take one tablet by mouth every other day, alternating with taking a half tablet (20mg  total) every other day) 68 tablet 3  . metolazone (ZAROXOLYN) 2.5 MG tablet Take as directed by the heart failure clinic (Patient taking differently: Take 2.5 mg by mouth daily as needed (Swelling). Take as directed by the heart failure clinic) 30 tablet 2  . polyethylene glycol (MIRALAX) 17 g packet Take 17 g by mouth daily as needed. (Patient taking differently: Take 17 g by mouth daily.) 14 each 0  . potassium chloride SA (KLOR-CON) 20 MEQ tablet Take 1 tablet (20 mEq total) by mouth daily. 90 tablet 3  . sildenafil (REVATIO) 20 MG tablet Take 20 mg by mouth daily as needed (ED).    Marland Kitchen spironolactone (ALDACTONE) 25 MG tablet TAKE 1/2 TABLET(12.5 MG) BY MOUTH DAILY 15 tablet 3  . Testosterone 1.62 % GEL Apply 2 Pump topically daily. 1 pump on each arm    . warfarin (COUMADIN) 5 MG tablet TAKE 1/2 TABLET BY MOUTH ON THURSDAY AND SATURDAY, THEN TAKE 1 TABLET BY MOUTH  ON SUNDAY, MONDAY, TUESDAY, WEDNESDAY, AND FRIDAY 30 tablet 0  . enoxaparin (LOVENOX) 60 MG/0.6ML injection Inject 0.6 mLs (60 mg total) into the skin daily. (Patient not taking: No sig reported) 6 mL 1   No current facility-administered medications for this encounter.    Allergies  Allergen Reactions  . Penicillins Rash    Did it involve swelling of the face/tongue/throat, SOB, or low BP? No Did it involve sudden or severe rash/hives, skin peeling, or any reaction on the inside of your mouth or nose? No Did you need to seek medical attention at a hospital or doctor's office? No When did it last happen?30 + years If all above answers are "NO", may proceed with cephalosporin use.       Social History   Socioeconomic History  . Marital  status: Divorced    Spouse name: Not on file  . Number of children: 2  . Years of education: 35  . Highest education level: High school graduate  Occupational History  . Not on file  Tobacco Use  . Smoking status: Former Smoker    Types: Cigars    Quit date: 09/03/2017    Years since quitting: 2.9  . Smokeless tobacco: Never Used  Vaping Use  . Vaping Use: Never used  Substance and Sexual Activity  . Alcohol use: No  . Drug use: No  . Sexual activity: Not Currently  Other Topics Concern  . Not on file  Social History Narrative   Patient lives alone in [place].    Patient works out 4x a week on home exercise bike.    Patient plays guitar.    Social Determinants of Health   Financial Resource Strain: Low Risk   . Difficulty of Paying Living Expenses: Not hard at all  Food Insecurity: No Food Insecurity  . Worried About Charity fundraiser in the Last Year: Never true  . Ran Out of Food in the Last Year: Never true  Transportation Needs: No Transportation Needs  . Lack of Transportation (Medical): No  . Lack of Transportation (Non-Medical): No  Physical Activity: Insufficiently Active  . Days of Exercise per Week: 4 days  .  Minutes of Exercise per Session: 20 min  Stress: No Stress Concern Present  . Feeling of Stress : Only a little  Social Connections: Socially Isolated  . Frequency of Communication with Friends and Family: More than three times a week  . Frequency of Social Gatherings with Friends and Family: More than three times a week  . Attends Religious Services: Never  . Active Member of Clubs or Organizations: No  . Attends Archivist Meetings: Never  . Marital Status: Divorced  Human resources officer Violence: Not At Risk  . Fear of Current or Ex-Partner: No  . Emotionally Abused: No  . Physically Abused: No  . Sexually Abused: No   Fhx: No FHX of premature CAD or SCD. Personally reviewed Vitals:   08/27/20 0936  BP: 92/64  Pulse: 91  SpO2: 99%  Weight: 60.1 kg (132 lb 6.4 oz)    PHYSICAL EXAM: ReDs Clip 33%  General:  Thin AAM. No respiratory difficulty HEENT: normal Neck: supple. JVD 7 cm. Carotids 2+ bilat; no bruits. No lymphadenopathy or thyromegaly appreciated. Cor: PMI nondisplaced. Regular rate & rhythm. + Crisp mechanical valve sounds  Lungs: clear Abdomen: soft, nontender, nondistended. No hepatosplenomegaly. No bruits or masses. Good bowel sounds. Extremities: no cyanosis, clubbing, rash, trace bilateral ankle edema Neuro: alert & oriented x 3, cranial nerves grossly intact. moves all 4 extremities w/o difficulty. Affect pleasant.  ASSESSMENT & PLAN:  1.  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 RV severely dilated and HK. RVSP 55 - RHC 8/20 with significantly elevated biventricular pressures, moderate to severe mixed PAH and low output (CI 1.96) - M-spike negative. PYP 1/21 negative for amyloid  - Functional status NYHA III, chronic  - He has R > L HF. ReDS 33% suggesting normal left-sided pressures but LE edema suggested he has some R-sided volume - Encouraged compression  hose - Continue to take metolazone as needed (carefully) - Will update echo  - Refer for sleep study to r/o sleep apnea (recently observed to have periods of apnea recent hospitalization +  h/o AFL)  - Refer to Cardiopulmonary Rehab  - Advanced options very limited. 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 Not ideal candidate for palliative inotropes given infection risk with mechanical MV - F/u w/ Dr. Haroldine Laws next visit after Echo   2. 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   - mild MS on last echo - on coumadin, INR followed by coumadin clinic  - aware of need for SBE prophylaxis  - will repeat echo   3.  Chronic a flutter  - rate controlled on coumadin.   4.  Thoracic aneurysm followed by Dr. Cyndia Bent  - CT chest 6/20 asc ao aorta 4.4cm   - not surgical candidate  5. Stage IV CKD - followed by nephrology   6. 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 Path negative for high-grade dysplasia or carcinoma - followed routinely by GI w/ recs for f/u EGD 05/2021 - h/o GIB/ chronic anemia. Denies any recent gross bleeding. Recent CBC w/ stable hgb     Annabella Elford, PA-C  10:21 AM

## 2020-09-01 ENCOUNTER — Emergency Department (HOSPITAL_COMMUNITY)
Admission: EM | Admit: 2020-09-01 | Discharge: 2020-09-01 | Disposition: A | Discharge status: Home / Self Care | Payer: Medicare HMO | Attending: Emergency Medicine | Admitting: Emergency Medicine

## 2020-09-01 ENCOUNTER — Emergency Department (HOSPITAL_COMMUNITY): Payer: Medicare HMO

## 2020-09-01 ENCOUNTER — Other Ambulatory Visit: Payer: Self-pay

## 2020-09-01 ENCOUNTER — Encounter (HOSPITAL_COMMUNITY): Payer: Self-pay

## 2020-09-01 DIAGNOSIS — I4819 Other persistent atrial fibrillation: Secondary | ICD-10-CM | POA: Insufficient documentation

## 2020-09-01 DIAGNOSIS — Z85038 Personal history of other malignant neoplasm of large intestine: Secondary | ICD-10-CM | POA: Insufficient documentation

## 2020-09-01 DIAGNOSIS — M542 Cervicalgia: Secondary | ICD-10-CM | POA: Diagnosis not present

## 2020-09-01 DIAGNOSIS — Z7901 Long term (current) use of anticoagulants: Secondary | ICD-10-CM | POA: Diagnosis not present

## 2020-09-01 DIAGNOSIS — N184 Chronic kidney disease, stage 4 (severe): Secondary | ICD-10-CM | POA: Insufficient documentation

## 2020-09-01 DIAGNOSIS — I5033 Acute on chronic diastolic (congestive) heart failure: Secondary | ICD-10-CM | POA: Insufficient documentation

## 2020-09-01 DIAGNOSIS — R109 Unspecified abdominal pain: Secondary | ICD-10-CM | POA: Insufficient documentation

## 2020-09-01 DIAGNOSIS — I130 Hypertensive heart and chronic kidney disease with heart failure and stage 1 through stage 4 chronic kidney disease, or unspecified chronic kidney disease: Secondary | ICD-10-CM | POA: Insufficient documentation

## 2020-09-01 DIAGNOSIS — I10 Essential (primary) hypertension: Secondary | ICD-10-CM | POA: Diagnosis not present

## 2020-09-01 DIAGNOSIS — Z87891 Personal history of nicotine dependence: Secondary | ICD-10-CM | POA: Diagnosis not present

## 2020-09-01 DIAGNOSIS — Z79899 Other long term (current) drug therapy: Secondary | ICD-10-CM | POA: Insufficient documentation

## 2020-09-01 MED ORDER — OXYCODONE-ACETAMINOPHEN 5-325 MG PO TABS: 1.0000 | ORAL_TABLET | Freq: Four times a day (QID) | ORAL | 0 refills | Status: DC | PRN

## 2020-09-01 NOTE — Discharge Instructions (Addendum)
Call your primary care doctor or specialist as discussed in the next 2-3 days.   Return immediately back to the ER if:  Your symptoms worsen within the next 12-24 hours. You develop new symptoms such as new fevers, persistent vomiting, new pain, shortness of breath, or new weakness or numbness, or if you have any other concerns.  

## 2020-09-01 NOTE — ED Provider Notes (Signed)
[facility] EMERGENCY DEPARTMENT Provider Note   CSN: [id number] Arrival date & time: 09/01/20  3244     History   [name] is a 70 y.o. male.  Patient presents with right-sided neck pain. Triage notation states abdominal pain, however the patient states is not his abdomen that hurts but is the right side of his neck.  He describes his right-sided neck pain as sharp and aching.  Worse when he moves his head a certain way.  Symptoms started yesterday midday.  All in all at home without significant relief.  Denies any fevers or cough any vomiting or diarrhea.  Denies any new numbness or weakness in his extremities.  Denies chest pain.  He states he has similar pain on the left side a few years ago that resolved with some physical therapy.        Past Medical History:  Diagnosis Date  . Allergy   . Aneurysm of ascending aorta (HCC)   . Atrial fibrillation ([place])   . BPH (benign prostatic hyperplasia)   . Colon polyps   . FHx: rheumatic heart disease   . H/O diplopia   . Hepatitis C   . Hypertension   . Hypogonadism male   . Mitral valve disease    mitral valve repair/replacement x3    Patient Active Problem List   Diagnosis Date Noted  . Hospital discharge follow-up 06/17/2020  . History of hypokalemia 06/17/2020  . CKD (chronic kidney disease) stage 4, GFR 15-29 ml/min (HCC) 06/17/2020  . Onychomycosis 06/17/2020  . H/O mitral valve replacement with mechanical valve   . Persistent atrial fibrillation ([place])   . Duodenal adenoma   . Anticoagulated   . Acute blood loss anemia 11/26/2019  . Gastric polyp   . Benign neoplasm of colon   . Vertigo 06/07/2018  . Acute on chronic diastolic heart failure ([place]) 03/04/2018  . Cirrhosis ([place]) 05/08/2014  . Chronic atrial flutter ([place]) 06/05/2013  . Hepatitis C 06/05/2013  . Chronic diastolic heart failure ([place]) 06/05/2013  . Encounter for therapeutic drug monitoring 04/30/2013  . Mechanical mitral  valve with surgery x3 01/08/2013  . History of colonic polyps 11/14/2012  . Thoracic aortic aneurysm ([place]) 05/02/2012    Past Surgical History:  Procedure Laterality Date  . BIOPSY  11/19/2019   Procedure: BIOPSY;  Surgeon: Yetta Flock, MD;  Location: [facility];  Service: Gastroenterology;;  EGD and COLON  . COLON SURGERY    . COLONOSCOPY    . COLONOSCOPY N/A 11/14/2012   Procedure: COLONOSCOPY;  Surgeon: Lear Ng, MD;  Location: [facility];  Service: Endoscopy;  Laterality: N/A;  . COLONOSCOPY WITH PROPOFOL N/A 11/19/2019   Procedure: COLONOSCOPY WITH PROPOFOL;  Surgeon: Yetta Flock, MD;  Location: [facility];  Service: Gastroenterology;  Laterality: N/A;  . COLONOSCOPY WITH PROPOFOL N/A 11/27/2019   Procedure: COLONOSCOPY WITH PROPOFOL;  Surgeon: Doran Stabler, MD;  Location: [facility];  Service: Gastroenterology;  Laterality: N/A;  . ESOPHAGOGASTRODUODENOSCOPY (EGD) WITH PROPOFOL N/A 05/08/2014   Procedure: ESOPHAGOGASTRODUODENOSCOPY (EGD) WITH PROPOFOL;  Surgeon: Lear Ng, MD;  Location: [place];  Service: Endoscopy;  Laterality: N/A;  . ESOPHAGOGASTRODUODENOSCOPY (EGD) WITH PROPOFOL N/A 11/19/2019   Procedure: ESOPHAGOGASTRODUODENOSCOPY (EGD) WITH PROPOFOL;  Surgeon: Yetta Flock, MD;  Location: [facility];  Service: Gastroenterology;  Laterality: N/A;  . ESOPHAGOGASTRODUODENOSCOPY (EGD) WITH PROPOFOL N/A 05/07/2020   Procedure: ESOPHAGOGASTRODUODENOSCOPY (EGD) WITH PROPOFOL;  Surgeon: Yetta Flock, MD;  Location: [facility];  Service: Gastroenterology;  Laterality: N/A;  . HEMOSTASIS CLIP PLACEMENT  11/19/2019   Procedure: HEMOSTASIS CLIP PLACEMENT;  Surgeon: Yetta Flock, MD;  Location: [facility];  Service: Gastroenterology;;  . HEMOSTASIS CLIP PLACEMENT  11/27/2019   Procedure: HEMOSTASIS CLIP PLACEMENT;  Surgeon: Doran Stabler, MD;  Location: [facility];  Service: Gastroenterology;;  .  HEMOSTASIS CLIP PLACEMENT  05/07/2020   Procedure: HEMOSTASIS CLIP PLACEMENT;  Surgeon: Yetta Flock, MD;  Location: [facility];  Service: Gastroenterology;;  . MITRAL VALVE REPLACEMENT     1973 (bioprosthesis), 1988 (St Jude mechanical vlave) , 2006 (repair due to leak)  . POLYPECTOMY  11/19/2019   Procedure: POLYPECTOMY;  Surgeon: Yetta Flock, MD;  Location: [facility];  Service: Gastroenterology;;  . POLYPECTOMY  05/07/2020   Procedure: POLYPECTOMY;  Surgeon: Yetta Flock, MD;  Location: [facility];  Service: Gastroenterology;;  . RIGHT HEART CATH N/A 11/24/2018   Procedure: RIGHT HEART CATH;  Surgeon: Jolaine Artist, MD;  Location: [place] [facility];  Service: Cardiovascular;  Laterality: N/A;  . RIGHT HEART CATH N/A 10/23/2019   Procedure: RIGHT HEART CATH;  Surgeon: Jolaine Artist, MD;  Location: [place] [facility];  Service: Cardiovascular;  Laterality: N/A;  . SUBMUCOSAL LIFTING INJECTION  05/07/2020   Procedure: SUBMUCOSAL LIFTING INJECTION;  Surgeon: Yetta Flock, MD;  Location: [facility];  Service: Gastroenterology;;  . UPPER GASTROINTESTINAL ENDOSCOPY     02-2010 and 05-2014       Family History  Family history unknown: Yes    Social History   Tobacco Use  . Smoking status: Former Smoker    Types: Cigars    Quit date: 09/03/2017    Years since quitting: 2.9  . Smokeless tobacco: Never Used  Vaping Use  . Vaping Use: Never used  Substance Use Topics  . Alcohol use: No  . Drug use: No    Home Medications Prior to Admission medications   Medication Sig Start Date End Date Taking? Authorizing Provider  amoxicillin (AMOXIL) 500 MG capsule TAKE 4 CAPSULES BY MOUTH 1 HOUR BEFORE DENTAL APPOINTMENT 03/06/20   Belva Crome, MD  Efinaconazole 10 % SOLN Apply to toenail daily for 48 weeks 06/17/20   Doristine Mango L, DO  enoxaparin (LOVENOX) 60 MG/0.6ML injection Inject 0.6 mLs (60 mg total) into the skin daily. Patient not  taking: No sig reported 04/24/20   Belva Crome, MD  ferrous sulfate 325 (65 FE) MG tablet Take 1 tablet (325 mg total) by mouth every other day. In the evening. Patient taking differently: Take 325 mg by mouth every other day. 12/02/19   Nita Sells, MD  furosemide (LASIX) 40 MG tablet TAKE 1 TABLET(40 MG) BY MOUTH DAILY 07/29/20   Bensimhon, Shaune Pascal, MD  furosemide (LASIX) 40 MG tablet Take one tablet by mouth every other day, alternating with taking a half tablet (20mg  total) every other day Patient taking differently: 20 mg. Take one tablet by mouth every other day, alternating with taking a half tablet (20mg  total) every other day 07/28/20   Belva Crome, MD  metolazone (ZAROXOLYN) 2.5 MG tablet Take as directed by the heart failure clinic Patient taking differently: Take 2.5 mg by mouth daily as needed (Swelling). Take as directed by the heart failure clinic 03/25/20   Bensimhon, Shaune Pascal, MD  polyethylene glycol (MIRALAX) 17 g packet Take 17 g by mouth daily as needed. Patient taking differently: Take 17 g by mouth daily. 08/28/19  Yetta Flock, MD  potassium chloride SA (KLOR-CON) 20 MEQ tablet Take 1 tablet (20 mEq total) by mouth daily. 12/11/19   Bensimhon, Shaune Pascal, MD  sildenafil (REVATIO) 20 MG tablet Take 20 mg by mouth daily as needed (ED). 11/13/19   [provider]  spironolactone (ALDACTONE) 25 MG tablet TAKE 1/2 TABLET(12.5 MG) BY MOUTH DAILY 08/06/20   Bensimhon, Shaune Pascal, MD  Testosterone 1.62 % GEL Apply 2 Pump topically daily. 1 pump on each arm 03/05/20   [provider]  warfarin (COUMADIN) 5 MG tablet TAKE 1/2 TABLET BY MOUTH ON THURSDAY AND SATURDAY, THEN TAKE 1 TABLET BY MOUTH ON SUNDAY, MONDAY, TUESDAY, WEDNESDAY, AND FRIDAY 08/21/20   Doristine Mango L, DO    Allergies    Penicillins  Review of Systems   Review of Systems  Constitutional: Negative for fever.  HENT: Negative for ear pain and sore throat.   Eyes: Negative for pain.   Respiratory: Negative for cough.   Cardiovascular: Negative for chest pain.  Gastrointestinal: Negative for abdominal pain.  Genitourinary: Negative for flank pain.  Musculoskeletal: Negative for back pain.  Skin: Negative for color change and rash.  Neurological: Negative for syncope.  All other systems reviewed and are negative.   Physical Exam Updated Vital Signs BP 105/79   Pulse 80   Temp 97.8 F (36.6 C) (Oral)   Resp 17   Ht 6\' 1"  (1.854 m)   Wt 59 kg   SpO2 99%   BMI 17.15 kg/m   Physical Exam Constitutional:      General: He is not in acute distress.    Appearance: He is well-developed.  HENT:     Head: Normocephalic.     Nose: Nose normal.  Eyes:     Extraocular Movements: Extraocular movements intact.  Cardiovascular:     Rate and Rhythm: Normal rate.  Pulmonary:     Effort: Pulmonary effort is normal.  Abdominal:     Tenderness: There is no abdominal tenderness.  Musculoskeletal:     Comments: Patient has his neck slightly forward flexed as a position of comfort for him.  He does have some limitation of range of motion due to pain.  He has 60 degrees rotation to the right and about 20 degrees rotation to the left before pain limits his movement to the left.  He is able to forward flex his neck approximately 45 degrees and extend his neck approximately 30 degrees.  Skin:    Coloration: Skin is not jaundiced.  Neurological:     General: No focal deficit present.     Mental Status: He is alert and oriented to person, place, and time. Mental status is at baseline.     Cranial Nerves: No cranial nerve deficit.     Motor: No weakness.     Gait: Gait normal.     ED Results / Procedures / Treatments   Labs (all labs ordered are listed, but only abnormal results are displayed) Labs Reviewed - No data to display  EKG EKG Interpretation  Date/Time:  Monday Sep 01 2020 09:02:42 EDT Ventricular Rate:  72 PR Interval:  236 QRS Duration: 117 QT  Interval:  475 QTC Calculation: 520 R Axis:   103 Text Interpretation: Sinus rhythm Prolonged PR interval Consider left ventricular hypertrophy Repol abnrm suggests ischemia, diffuse leads Prolonged QT interval Confirmed by Thamas Jaegers ([id number]) on 09/01/2020 9:21:49 AM   Radiology DG Cervical Spine 2-3 Views  Result Date: 09/01/2020 CLINICAL DATA:  70 year old male with history of right-sided neck pain radiating into the right posterior shoulder for the past 2 days. EXAM: CERVICAL SPINE - 2-3 VIEW COMPARISON:  No priors. FINDINGS: Three views of the cervical spine demonstrate no acute displaced fracture. There is reversal of normal cervical lordosis centered at the level of C5-C6. Alignment is otherwise anatomic. Prevertebral soft tissues are normal. Multilevel degenerative disc disease, most pronounced at C5-C6 and C6-C7. Moderate multilevel facet arthropathy most severe on the left at C5-C6. IMPRESSION: 1. Multilevel degenerative disc disease and cervical spondylosis, as above. 2. Negative for acute fracture. Electronically Signed   By: Vinnie Langton M.D.   On: 09/01/2020 08:48    Procedures Procedures   Medications Ordered in ED Medications - No data to display  ED Course  I have reviewed the triage vital signs and the nursing notes.  Pertinent labs & imaging results that were available during my care of the patient were reviewed by me and considered in my medical decision making (see chart for details).    MDM Rules/Calculators/A&P                          Triage notation states abdominal pain.  Have the patient states is not his abdomen that hurts but is the right side of his neck.  X-rays of the neck show no evidence of acute fracture or foreign body.  Positive degenerative joint disease noted.  Patient states he drove himself here, thus a prescription of narcotic medication provided for him to pick up at the pharmacy.  EKG shows atrial flutter with variable block normal rate.   No ST elevations noted.  Advising outpatient follow-up again with his doctor within the week.  Advising immediate return for worsening pains or new numbness weakness or any additional concerns.  Final Clinical Impression(s) / ED Diagnoses Final diagnoses:  Neck pain    Rx / DC Orders ED Discharge Orders    None       Luna Fuse, MD 09/01/20 [id number]

## 2020-09-01 NOTE — ED Triage Notes (Signed)
Pain in the right side of neck that radiates to right shoulder, right arm and shoulder blades. Started yesterday afternoon, took tylenol with no relief. No trauma. Hx of same in the left side that needed physical therapy.

## 2020-09-02 ENCOUNTER — Telehealth: Payer: Self-pay

## 2020-09-02 NOTE — Telephone Encounter (Signed)
Patient scheduled for 09/04/20.

## 2020-09-03 ENCOUNTER — Ambulatory Visit (INDEPENDENT_AMBULATORY_CARE_PROVIDER_SITE_OTHER): Payer: Medicare HMO | Admitting: Family Medicine

## 2020-09-03 ENCOUNTER — Other Ambulatory Visit: Payer: Self-pay

## 2020-09-03 DIAGNOSIS — S29012A Strain of muscle and tendon of back wall of thorax, initial encounter: Secondary | ICD-10-CM

## 2020-09-03 MED ORDER — BACLOFEN 10 MG PO TABS: 5.0000 mg | ORAL_TABLET | Freq: Two times a day (BID) | ORAL | 0 refills | Status: AC

## 2020-09-03 NOTE — Patient Instructions (Signed)
It was wonderful seeing you today.  I am sorry you are still having issues with your back.  I am prescribing a medication called baclofen.  Because of your kidney function I had to decrease the dose.  I want you to take half a tablet twice daily as needed.  If your symptoms do not improve please feel free to call the clinic and let us know, I can put a referral in for physical therapy.  If you have any questions or concerns please let me know.  I hope you have a wonderful afternoon clinician.

## 2020-09-03 NOTE — Progress Notes (Signed)
    SUBJECTIVE:   CHIEF COMPLAINT / HPI:   Back pain Patient reports that he has had severe pain in his back for 4 days.  He is not sure what he was doing when the pain started but reports that the pain is right near his shoulder blade on his left side.  He has been taking hydrocodone provided by the ED on 5/30 with little relief.  Reports range of motion is limited by pain.  Has had this occur before.   OBJECTIVE:   BP 94/62   Pulse 87   Wt 127 lb 3.2 oz (57.7 kg)   SpO2 99%   BMI 16.78 kg/m   General: Well-appearing 69 year old male in no acute distress Cardiac: Regular rate and rhythm, no murmurs appreciated Respiratory: Breathing, clear auscultation bilaterally Skin: Patient with tenderness in the rhomboid area on the left side.  Pain with abduction and adduction.  No decreased range of motion. ASSESSMENT/PLAN:   Rhomboid muscle strain Patient's physical exam consistent with a rhomboid strain.  He has been taking narcotics for the pain.  Recommended stretching and decrease use of narcotics.  Will prescribe prescription for muscle relaxer which has to be decreased in dose due to patient's kidney function.  If symptoms do not improve we will put referral in for physical therapy.  Strict return precautions given.     Gifford Shave, MD [place]

## 2020-09-04 ENCOUNTER — Ambulatory Visit (HOSPITAL_COMMUNITY): Admission: RE | Admit: 2020-09-04 | Payer: Medicare HMO | Source: Ambulatory Visit

## 2020-09-04 ENCOUNTER — Other Ambulatory Visit: Payer: Medicare HMO

## 2020-09-04 DIAGNOSIS — D649 Anemia, unspecified: Secondary | ICD-10-CM

## 2020-09-04 DIAGNOSIS — K7460 Unspecified cirrhosis of liver: Secondary | ICD-10-CM

## 2020-09-04 DIAGNOSIS — Z7901 Long term (current) use of anticoagulants: Secondary | ICD-10-CM

## 2020-09-05 ENCOUNTER — Encounter (HOSPITAL_COMMUNITY): Payer: Self-pay | Admitting: *Deleted

## 2020-09-05 DIAGNOSIS — S29012A Strain of muscle and tendon of back wall of thorax, initial encounter: Secondary | ICD-10-CM | POA: Insufficient documentation

## 2020-09-05 NOTE — Assessment & Plan Note (Signed)
Patient's physical exam consistent with a rhomboid strain.  He has been taking narcotics for the pain.  Recommended stretching and decrease use of narcotics.  Will prescribe prescription for muscle relaxer which has to be decreased in dose due to patient's kidney function.  If symptoms do not improve we will put referral in for physical therapy.  Strict return precautions given.

## 2020-09-05 NOTE — Progress Notes (Signed)
Received referral from Dr. Haroldine Laws for this pt to participate in pulmonary rehab with the the diagnosis of Chronic diastolic Heart failure.  Clinical review of pt follow up appt on 5/25 Heart Failure office note. Also reviewed post hospitalization PCP follow up and discharge summary Pt with Covid Risk Score - 4. Pt appropriate for scheduling for Pulmonary rehab.  Will forward to support staff for scheduling when able due to the wait list and verification of insurance eligibility/benefits with pt consent. Cherre Huger, BSN Cardiac and Training and development officer

## 2020-09-08 ENCOUNTER — Telehealth (HOSPITAL_COMMUNITY): Payer: Self-pay

## 2020-09-08 NOTE — Telephone Encounter (Signed)
Pt called wanting to schedule for pulmonary rehab, I advised pt of the backlog and that we are 2-4 months out I advised pt that we would give him a call back to schedule at a later date. Pt understood.

## 2020-09-09 NOTE — Progress Notes (Signed)
Cardiology Office Note:    Date:  09/10/2020   ID:  [name], DOB 01/30/1951, MRN [id number]  PCP:  [name], DO  Cardiologist:  Sinclair Grooms, MD   Referring MD: [name], DO   Chief Complaint  Patient presents with  . Congestive Heart Failure    History of Present Illness:    [name] is a 70 y.o. 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 Other problems include hepatitis C, chronic atrial flutter, ascending aortic aneurysm 4.5 cm, chronic kidney disease stage III,chronic anemiaand chronic anticoagulation.  He is depressed.  Weight is continuing downward.  He feels weak.  He is concerned about how much longer he can go.  Adjustments were made in diuretic regimen by the advanced heart failure clinic, and swelling/dyspnea has improved.  Past Medical History:  Diagnosis Date  . Allergy   . Aneurysm of ascending aorta (HCC)   . Atrial fibrillation ([place])   . BPH (benign prostatic hyperplasia)   . Colon polyps   . FHx: rheumatic heart disease   . H/O diplopia   . Hepatitis C   . Hypertension   . Hypogonadism male   . Mitral valve disease    mitral valve repair/replacement x3    Past Surgical History:  Procedure Laterality Date  . BIOPSY  11/19/2019   Procedure: BIOPSY;  Surgeon: Yetta Flock, MD;  Location: [facility];  Service: Gastroenterology;;  EGD and COLON  . COLON SURGERY    . COLONOSCOPY    . COLONOSCOPY N/A 11/14/2012   Procedure: COLONOSCOPY;  Surgeon: Lear Ng, MD;  Location: [facility];  Service: Endoscopy;  Laterality: N/A;  . COLONOSCOPY WITH PROPOFOL N/A 11/19/2019   Procedure: COLONOSCOPY WITH PROPOFOL;  Surgeon: Yetta Flock, MD;  Location: [facility];  Service: Gastroenterology;  Laterality: N/A;  . COLONOSCOPY  WITH PROPOFOL N/A 11/27/2019   Procedure: COLONOSCOPY WITH PROPOFOL;  Surgeon: Doran Stabler, MD;  Location: [facility];  Service: Gastroenterology;  Laterality: N/A;  . ESOPHAGOGASTRODUODENOSCOPY (EGD) WITH PROPOFOL N/A 05/08/2014   Procedure: ESOPHAGOGASTRODUODENOSCOPY (EGD) WITH PROPOFOL;  Surgeon: Lear Ng, MD;  Location: [facility];  Service: Endoscopy;  Laterality: N/A;  . ESOPHAGOGASTRODUODENOSCOPY (EGD) WITH PROPOFOL N/A 11/19/2019   Procedure: ESOPHAGOGASTRODUODENOSCOPY (EGD) WITH PROPOFOL;  Surgeon: Yetta Flock, MD;  [facility];  Service: Gastroenterology;  Laterality: N/A;  . ESOPHAGOGASTRODUODENOSCOPY (EGD) WITH PROPOFOL N/A 05/07/2020   Procedure: ESOPHAGOGASTRODUODENOSCOPY (EGD) WITH PROPOFOL;  Surgeon: Yetta Flock, MD;  [facility];  Service: Gastroenterology;  Laterality: N/A;  . HEMOSTASIS CLIP PLACEMENT  11/19/2019   Procedure: HEMOSTASIS CLIP PLACEMENT;  Surgeon: Yetta Flock, MD;  Location: [facility];  Service: Gastroenterology;;  . HEMOSTASIS CLIP PLACEMENT  11/27/2019   Procedure: HEMOSTASIS CLIP PLACEMENT;  Surgeon: Doran Stabler, MD;  [facility];  Service: Gastroenterology;;  . HEMOSTASIS CLIP PLACEMENT  05/07/2020   Procedure: HEMOSTASIS CLIP PLACEMENT;  Surgeon: Yetta Flock, MD;  Location: [facility];  Service: Gastroenterology;;  . MITRAL VALVE REPLACEMENT     1973 (bioprosthesis), 1988 ([employer] mechanical vlave) , 2006 (repair due to leak)  . POLYPECTOMY  11/19/2019   Procedure: POLYPECTOMY;  Surgeon: Yetta Flock, MD;  [facility]: [facility];  Service: Gastroenterology;;  . POLYPECTOMY  05/07/2020   Procedure: POLYPECTOMY;  Surgeon: Yetta Flock, MD;  Location: [facility];  Service: Gastroenterology;;  . RIGHT HEART CATH N/A 11/24/2018   Procedure: RIGHT HEART CATH;  Surgeon: Jolaine Artist, MD;  Location: [facility];  Service: Cardiovascular;   Laterality: N/A;  . RIGHT HEART CATH N/A 10/23/2019   Procedure: RIGHT HEART CATH;  Surgeon: Jolaine Artist, MD;  Location: [facility];  Service: Cardiovascular;  Laterality: N/A;  . SUBMUCOSAL LIFTING INJECTION  05/07/2020   Procedure: SUBMUCOSAL LIFTING INJECTION;  Surgeon: Yetta Flock, MD;  Location: [facility];  Service: Gastroenterology;;  . UPPER GASTROINTESTINAL ENDOSCOPY     02-2010 and 05-2014    Current Medications: Current Meds  Medication Sig  . amoxicillin (AMOXIL) 500 MG capsule TAKE 4 CAPSULES BY MOUTH 1 HOUR BEFORE DENTAL APPOINTMENT  . baclofen (LIORESAL) 10 MG tablet Take 0.5 tablets (5 mg total) by mouth 2 (two) times daily for 7 days.  . Efinaconazole 10 % SOLN Apply to toenail daily for 48 weeks  . enoxaparin (LOVENOX) 60 MG/0.6ML injection Inject 0.6 mLs (60 mg total) into the skin daily.  . ferrous sulfate 325 (65 FE) MG tablet Take 1 tablet (325 mg total) by mouth every other day. In the evening. (Patient taking differently: Take 325 mg by mouth every other day.)  . furosemide (LASIX) 40 MG tablet Take one tablet by mouth every other day, alternating with taking a half tablet (20mg  total) every other day (Patient taking differently: 20 mg. Take one tablet by mouth every other day, alternating with taking a half tablet (20mg  total) every other day)  . metolazone (ZAROXOLYN) 2.5 MG tablet Take as directed by the heart failure clinic (Patient taking differently: Take 2.5 mg by mouth daily as needed (Swelling). Take as directed by the heart failure clinic)  . oxyCODONE-acetaminophen (PERCOCET/ROXICET) 5-325 MG tablet Take 1 tablet by mouth every 6 (six) hours as needed for severe pain.  . polyethylene glycol (MIRALAX) 17 g packet Take 17 g by mouth daily as needed. (Patient taking differently: Take 17 g by mouth daily.)  . potassium chloride SA (KLOR-CON) 20 MEQ tablet Take 1 tablet (20 mEq total) by mouth daily.  . sildenafil (REVATIO) 20 MG tablet Take  20 mg by mouth daily as needed (ED).  Marland Kitchen spironolactone (ALDACTONE) 25 MG tablet TAKE 1/2 TABLET(12.5 MG) BY MOUTH DAILY  . Testosterone 1.62 % GEL Apply 2 Pump topically daily. 1 pump on each arm  . warfarin (COUMADIN) 5 MG tablet TAKE 1/2 TABLET BY MOUTH ON THURSDAY AND SATURDAY, THEN TAKE 1 TABLET BY MOUTH ON SUNDAY, [place], [place], WEDNESDAY, AND FRIDAY  . [DISCONTINUED] furosemide (LASIX) 40 MG tablet TAKE 1 TABLET(40 MG) BY MOUTH DAILY     Allergies:   Penicillins   Social History   Socioeconomic History  . Marital status: Divorced    Spouse name: Not on file  . Number of children: 2  . Years of education: 36  . Highest education level: High school graduate  Occupational History  . Not on file  Tobacco Use  . Smoking status: Former Smoker    Types: Cigars    Quit date: 09/03/2017    Years since quitting: 3.0  . Smokeless tobacco: Never Used  Vaping Use  . Vaping Use: Never used  Substance and Sexual Activity  . Alcohol use: No  . Drug use: No  . Sexual activity: Not Currently  Other Topics Concern  . Not on file  Social History Narrative  Patient lives alone in [place].    Patient works out 4x a week on home exercise bike.    Patient plays guitar.    Social Determinants of Health   Financial Resource Strain: Low Risk   . Difficulty of Paying Living Expenses: Not hard at all  Food Insecurity: No Food Insecurity  . Worried About Charity fundraiser in the Last Year: Never true  . Ran Out of Food in the Last Year: Never true  Transportation Needs: No Transportation Needs  . Lack of Transportation (Medical): No  . Lack of Transportation (Non-Medical): No  Physical Activity: Insufficiently Active  . Days of Exercise per Week: 4 days  . Minutes of Exercise per Session: 20 min  Stress: No Stress Concern Present  . Feeling of Stress : Only a little  Social Connections: Socially Isolated  . Frequency of Communication with Friends and Family: More than three times a  week  . Frequency of Social Gatherings with Friends and Family: More than three times a week  . Attends Religious Services: Never  . Active Member of Clubs or Organizations: No  . Attends Archivist Meetings: Never  . Marital Status: Divorced     Family History: The patient's Family history is unknown by patient.  ROS:   Please see the history of present illness.    Weight loss.  Decreased appetite.  Has to force feed himself.  All other systems reviewed and are negative.  EKGs/Labs/Other Studies Reviewed:    The following studies were reviewed today: No new data  EKG:  EKG not repeated  Recent Labs: 09/27/2019: TSH 2.06 12/06/2019: B Natriuretic Peptide 466.3 07/25/2020: ALT 42; Hemoglobin 9.4; NT-Pro BNP 8,332; Platelets 123 08/21/2020: BUN 45; Creatinine, Ser 2.60; Potassium 3.9; Sodium 136  Recent Lipid Panel No results found for: CHOL, TRIG, HDL, CHOLHDL, VLDL, LDLCALC, LDLDIRECT  Physical Exam:    VS:  BP 118/64   Pulse (!) 56   Ht 6\' 1"  (1.854 m)   Wt 126 lb (57.2 kg)   BMI 16.62 kg/m     Wt Readings from Last 3 Encounters:  09/10/20 126 lb (57.2 kg)  09/03/20 127 lb 3.2 oz (57.7 kg)  09/01/20 130 lb (59 kg)     GEN: Cachectic. No acute distress HEENT: Normal NECK: Prominent CV waves with the patient sitting.Marland Kitchen LYMPHATICS: No lymphadenopathy CARDIAC: 2/6 crescendo decrescendo right upper sternal systolic but no diastolic murmur. RRR no gallop, or edema. VASCULAR:  Normal Pulses. No bruits. RESPIRATORY:  Clear to auscultation without rales, wheezing or rhonchi  ABDOMEN: Soft, non-tender, non-distended, No pulsatile mass, MUSCULOSKELETAL: No deformity  SKIN: Warm and dry NEUROLOGIC:  Alert and oriented x 3 PSYCHIATRIC:  Normal affect   ASSESSMENT:    1. Chronic diastolic heart failure ([place])   2. Chronic atrial flutter (HCC)   3. Mitral valve disorder   4. Thoracic aortic aneurysm without rupture ([place])   5. Cor pulmonale, chronic ([place])   6.  Essential hypertension   7. Cirrhosis of liver with ascites, unspecified hepatic cirrhosis type ([place])   8. Stage 3 chronic kidney disease, unspecified whether stage 3a or 3b CKD ([place])   9. Anticoagulation goal of INR 2.5 to 3.5    PLAN:    In order of problems listed above:  1. No clinical evidence of volume overload on current diuretic regimen. 2. Chronically present with rate control. 3. Conical valve closure sound is normal. 4. Not discussed 5. Clinical exam suggest lower pulmonary pressure  6. Not a concern on current diuretic regimen which also includes Aldactone, Zaroxolyn, and furosemide. 7. Acknowledged but not discussed. 8. CKD stage IV now present based upon a creatinine of 2.6 in May 2022.  Discussed this with the patient. 9. Continue anticoagulation therapy.  27-month follow-up. Call earlier if refractory depression, inability to eat, swelling, or chest pain.   Medication Adjustments/Labs and Tests Ordered: Current medicines are reviewed at length with the patient today.  Concerns regarding medicines are outlined above.  No orders of the defined types were placed in this encounter.  No orders of the defined types were placed in this encounter.   Patient Instructions  Medication Instructions:  Your physician recommends that you continue on your current medications as directed. Please refer to the Current Medication list given to you today.  *If you need a refill on your cardiac medications before your next appointment, please call your pharmacy*   Lab Work: None If you have labs (blood work) drawn today and your tests are completely normal, you will receive your results only by: Marland Kitchen MyChart Message (if you have MyChart) OR . A paper copy in the mail If you have any lab test that is abnormal or we need to change your treatment, we will call you to review the results.   Testing/Procedures: None   Follow-Up: At [facility], you and your health needs are our  priority.  As part of our continuing mission to provide you with exceptional heart care, we have created designated Provider Care Teams.  These Care Teams include your primary Cardiologist (physician) and Advanced Practice Providers (APPs -  Physician Assistants and Nurse Practitioners) who all work together to provide you with the care you need, when you need it.  We recommend signing up for the patient portal called "MyChart".  Sign up information is provided on this After Visit Summary.  MyChart is used to connect with patients for Virtual Visits (Telemedicine).  Patients are able to view lab/test results, encounter notes, upcoming appointments, etc.  Non-urgent messages can be sent to your provider as well.   To learn more about what you can do with MyChart, go to [email or web address].    Your next appointment:   6 month(s)  The format for your next appointment:   In Person  Provider:   You may see Sinclair Grooms, MD or one of the following Advanced Practice Providers on your designated Care Team:    Kathyrn Drown, NP    Other Instructions      Signed, Sinclair Grooms, MD  09/10/2020 10:22 AM    [place]

## 2020-09-10 ENCOUNTER — Encounter: Payer: Self-pay | Admitting: Interventional Cardiology

## 2020-09-10 ENCOUNTER — Ambulatory Visit (INDEPENDENT_AMBULATORY_CARE_PROVIDER_SITE_OTHER): Payer: Medicare HMO | Admitting: *Deleted

## 2020-09-10 ENCOUNTER — Ambulatory Visit: Payer: Medicare HMO | Admitting: Interventional Cardiology

## 2020-09-10 ENCOUNTER — Other Ambulatory Visit: Payer: Self-pay

## 2020-09-10 VITALS — BP 118/64 | HR 56 | Ht 73.0 in | Wt 126.0 lb

## 2020-09-10 DIAGNOSIS — I4892 Unspecified atrial flutter: Secondary | ICD-10-CM

## 2020-09-10 DIAGNOSIS — I059 Rheumatic mitral valve disease, unspecified: Secondary | ICD-10-CM

## 2020-09-10 DIAGNOSIS — I5032 Chronic diastolic (congestive) heart failure: Secondary | ICD-10-CM

## 2020-09-10 DIAGNOSIS — I2781 Cor pulmonale (chronic): Secondary | ICD-10-CM | POA: Diagnosis not present

## 2020-09-10 DIAGNOSIS — I712 Thoracic aortic aneurysm, without rupture, unspecified: Secondary | ICD-10-CM

## 2020-09-10 DIAGNOSIS — R188 Other ascites: Secondary | ICD-10-CM | POA: Diagnosis not present

## 2020-09-10 DIAGNOSIS — Z5181 Encounter for therapeutic drug level monitoring: Secondary | ICD-10-CM | POA: Diagnosis not present

## 2020-09-10 DIAGNOSIS — I10 Essential (primary) hypertension: Secondary | ICD-10-CM | POA: Diagnosis not present

## 2020-09-10 DIAGNOSIS — Z7901 Long term (current) use of anticoagulants: Secondary | ICD-10-CM

## 2020-09-10 DIAGNOSIS — K7460 Unspecified cirrhosis of liver: Secondary | ICD-10-CM | POA: Diagnosis not present

## 2020-09-10 DIAGNOSIS — N183 Chronic kidney disease, stage 3 unspecified: Secondary | ICD-10-CM

## 2020-09-10 LAB — POCT INR: INR: 3.6 — AB (ref 2.0–3.0)

## 2020-09-10 NOTE — Patient Instructions (Signed)
Description   Today take 1/2 tablet then continue taking Warfarin 1 tablet daily except for 1/2 tablet on Sunday, Tuesday and Thursday. Recheck INR in 4 weeks. [facility] [phone].

## 2020-09-10 NOTE — Patient Instructions (Signed)

## 2020-09-15 ENCOUNTER — Other Ambulatory Visit: Payer: Self-pay

## 2020-09-15 ENCOUNTER — Ambulatory Visit (HOSPITAL_COMMUNITY)
Admission: RE | Admit: 2020-09-15 | Discharge: 2020-09-15 | Disposition: A | Discharge status: Home / Self Care | Payer: Medicare HMO | Source: Ambulatory Visit | Attending: Gastroenterology | Admitting: Gastroenterology

## 2020-09-15 DIAGNOSIS — K7460 Unspecified cirrhosis of liver: Secondary | ICD-10-CM

## 2020-09-15 DIAGNOSIS — K8020 Calculus of gallbladder without cholecystitis without obstruction: Secondary | ICD-10-CM | POA: Diagnosis not present

## 2020-09-23 ENCOUNTER — Other Ambulatory Visit: Payer: Self-pay | Admitting: Student in an Organized Health Care Education/Training Program

## 2020-09-30 ENCOUNTER — Ambulatory Visit (HOSPITAL_COMMUNITY)
Admission: RE | Admit: 2020-09-30 | Discharge: 2020-09-30 | Disposition: A | Discharge status: Home / Self Care | Payer: Medicare HMO | Source: Ambulatory Visit | Attending: Physician Assistant | Admitting: Physician Assistant

## 2020-09-30 ENCOUNTER — Telehealth (HOSPITAL_COMMUNITY): Payer: Self-pay | Admitting: Vascular Surgery

## 2020-09-30 ENCOUNTER — Other Ambulatory Visit: Payer: Self-pay

## 2020-09-30 DIAGNOSIS — I5032 Chronic diastolic (congestive) heart failure: Secondary | ICD-10-CM | POA: Diagnosis not present

## 2020-09-30 DIAGNOSIS — I110 Hypertensive heart disease with heart failure: Secondary | ICD-10-CM | POA: Insufficient documentation

## 2020-09-30 DIAGNOSIS — I351 Nonrheumatic aortic (valve) insufficiency: Secondary | ICD-10-CM | POA: Insufficient documentation

## 2020-09-30 DIAGNOSIS — I4891 Unspecified atrial fibrillation: Secondary | ICD-10-CM | POA: Insufficient documentation

## 2020-09-30 LAB — ECHOCARDIOGRAM COMPLETE
Area-P 1/2: 4.07 cm2
MV VTI: 1.06 cm2
P 1/2 time: 510 msec
S' Lateral: 3.3 cm

## 2020-09-30 NOTE — Progress Notes (Signed)
  Echocardiogram 2D Echocardiogram has been performed.  Fidel Levy 09/30/2020, 3:56 PM

## 2020-09-30 NOTE — Telephone Encounter (Signed)
PT wants someone to call him about Cardiac rehab

## 2020-10-01 ENCOUNTER — Telehealth (HOSPITAL_COMMUNITY): Payer: Self-pay | Admitting: Surgery

## 2020-10-01 NOTE — Telephone Encounter (Signed)
Per Pulmonary Rehab referral note:  General 09/08/2020  3:15 PM [name] - -  Note   Pt called wanting to schedule for pulmonary rehab, I advised pt of the backlog and that we are 2-4 months out I advised pt that we would give him a call back to schedule at a later date. Pt understood.         Attempted to call pt and left VM with this info and to call back if he has further questions

## 2020-10-01 NOTE — Telephone Encounter (Signed)
I attempted to reach patient regarding completing the ordered home sleep study.  He tells me that he has the device and plans to complete tonite. He denies having any questions or concerns.

## 2020-10-09 ENCOUNTER — Telehealth (HOSPITAL_COMMUNITY): Payer: Self-pay | Admitting: Surgery

## 2020-10-09 NOTE — Progress Notes (Signed)
Appt scheduled

## 2020-10-09 NOTE — Telephone Encounter (Signed)
I called patient and communicated that his home sleep study did not contain enough data.  He plans to return to clinic on Monday and will take another home device to complete.

## 2020-10-10 ENCOUNTER — Other Ambulatory Visit: Payer: Self-pay

## 2020-10-10 ENCOUNTER — Ambulatory Visit: Payer: Medicare HMO | Admitting: Pharmacist

## 2020-10-10 DIAGNOSIS — Z5181 Encounter for therapeutic drug level monitoring: Secondary | ICD-10-CM | POA: Diagnosis not present

## 2020-10-10 DIAGNOSIS — I4892 Unspecified atrial flutter: Secondary | ICD-10-CM | POA: Diagnosis not present

## 2020-10-10 DIAGNOSIS — I059 Rheumatic mitral valve disease, unspecified: Secondary | ICD-10-CM

## 2020-10-10 LAB — POCT INR: INR: 2.6 (ref 2.0–3.0)

## 2020-10-10 NOTE — Patient Instructions (Signed)
Description   Continue taking Warfarin 1 tablet daily except for 1/2 tablet on Sunday, Tuesday and Thursday. Recheck INR in 4 weeks. Coumadin Clinic [phone].

## 2020-10-11 NOTE — Progress Notes (Addendum)
ADVANCED HF CLINIC NOTE   Primary Cardiologist: Dr. Daneen Schick [facility]: Dr. Haroldine Laws   HPI:  [name] is a 70 y.o. male who was initially referred by Dr. Tamala Julian for evalaution of Pulmoanry HTN.   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 Other problems include hepatitis C (s/p curative therapy), chronic atrial flutter, ascending aortic aneurysm 4.5 cm, chronic kidney disease stage III, chronic anemia.     CTA of chest 6/20 neg for PE but dilated main pulmonary artery at 4.5 cm. + emphysema. Ectatic ascending aorta 4.4 cm.    Echo 7/20 EF 60-65%, moderately increased LV wall thickness.  Mod LVH. RV with severely reduced systolic function pressure moderately elevated at 54.9 mmHg mildly dilated aortic root 4.4 cm, mild AI s/p MVR with mean gradient of 5 , severe LAE, severe RV dysfunction mild TR estimated RVSP moderately elevated.    We saw him for the first time on 11/08/18. More SOB, more LE edema. NYHA IIIB symptoms. Underwent RHC to assess need for inotropes (see below).  Admitted 8/21 for GIB after recent polypectomy. EGD 8/16 with colonoscopy-found to have gastric polyps including sigmoid colon polyp 6 mm. Developed self-limiting bleed subsequent to this but 5 days later bleeding restarted despite holding Lovenox-found to have bright red blood per rectum. In ED patient was hypotensive-hemoglobin 10.2-->8.4 INR is 2.1  Repeat colonoscopy 11/27/2019 blood in sigmoid colon clips placed at polypectomy sites. Received 3 units of blood.   Last seen in the [facility] 9/21. At the time, endorsed NYHA Class III symptoms. Exam was notable for R> L HF. ReDS was 22%. Was encouraged to use compression hose and metolazone as needed (carefully). Not felt to be a candidate for advanced therapies. 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 Not  transplant candidate w CKD 4. Not ideal candidate for palliative inotropes given infection risk with mechanical MV. He was ordered to get repeat echo but this was never completed.   2/22, he presented for outpatient EGD for polypectomy. Post procedure, he was altered and hypotensive and he required intubation for airway protection in the setting of acute hypercarbic (pH 7.16 / pCO2 68) respiratory failure.  Per report, he had ST-T wave abnormalities.  He was admitted to the ICU for observation. Concern for potential sleep apnea and sleep evaluation was recommended. Cardiology was consulted per family request but nothing further was added.   Echo & sleep study ordered. CR arranged 5/22. EF down to 35-40%.  Today he returns for HF follow up and discussion of lower EF. SOB with stairs, some flat ground, occasionally dizziness with position changes, and some ankle swelling.  Denies increasing SOB, CP, dizziness, edema, or PND/Orthopnea. Appetite poor over past year. No fever or chills. Weight at home 128 pounds. Taking all medications. Drinking Ensure. Using metolazone once a week. Able to use stationary bike some. Expressed frustration that he does not know what this visit is for.  Cardiac Studies: - Echo (6/22): EF 25-30%, RV severely reduced, moderate AR, mod/severe PR - Echo (7/20):  EF normal. RV severely dilated and HK. RVSP 55  - RHC (7/21): Moderate mixed PH with moderately depressed cardiac output RA = 12 RV = 65/12 PA = 65/24 (41) PCW = 20 (v-wave 37) Fick cardiac output/index = 3.85/2.13 PVR = 5.5 FA sat = 98% PA sat = 52%, 54%  PAPi = 3.4  - RHC (8/20): Moderate mixed pulmonary HTN with volume overload and moderately to severely reduced CO. No role for selective pulmonary artery vasodialtors at this point with elevated left-sided pressures.       RHC on 11/24/18       RA = 17      RV = 71/17      PA = 71/27 (45)      PCW = 27 (v = 25)      Fick cardiac output/index = 3.7/1.96       PVR = 5.0 WU      Ao sat = 98%      PA sat = 52%, 54%      PaPI = 3.17  Past Medical History:  Diagnosis Date   Allergy    Aneurysm of ascending aorta (HCC)    Atrial fibrillation (HCC)    BPH (benign prostatic hyperplasia)    Colon polyps    FHx: rheumatic heart disease    H/O diplopia    Hepatitis C    Hypertension    Hypogonadism male    Mitral valve disease    mitral valve repair/replacement x3    Current Outpatient Medications  Medication Sig Dispense Refill   amoxicillin (AMOXIL) 500 MG capsule TAKE 4 CAPSULES BY MOUTH 1 HOUR BEFORE DENTAL APPOINTMENT 4 capsule 1   Efinaconazole 10 % SOLN Apply to toenail daily for 48 weeks 8 mL 3   enoxaparin (LOVENOX) 60 MG/0.6ML injection Inject 0.6 mLs (60 mg total) into the skin daily. 6 mL 1   ferrous sulfate 325 (65 FE) MG tablet Take 1 tablet (325 mg total) by mouth every other day. In the evening. (Patient taking differently: Take 325 mg by mouth every other day. In the evening.)  3   furosemide (LASIX) 40 MG tablet Take one tablet by mouth every other day, alternating with taking a half tablet (20mg  total) every other day (Patient taking differently: 20 mg. Take one tablet by mouth every other day, alternating with taking a half tablet (20mg  total) every other day) 68 tablet 3   metolazone (ZAROXOLYN) 2.5 MG tablet Take as directed by the heart failure clinic (Patient taking differently: Take 2.5 mg by mouth daily as needed (Swelling). Take as directed by the heart failure clinic) 30 tablet 2   polyethylene glycol (MIRALAX) 17 g packet Take 17 g by mouth daily as needed. (Patient taking differently: Take 17 g by mouth daily.) 14 each 0   potassium chloride SA (KLOR-CON) 20 MEQ tablet Take 1 tablet (20 mEq total) by mouth daily. 90 tablet 3   sildenafil (REVATIO) 20 MG tablet Take 20 mg by mouth daily as needed (ED).     spironolactone (ALDACTONE) 25 MG tablet TAKE 1/2 TABLET(12.5 MG) BY MOUTH DAILY 15 tablet 3   Testosterone 1.62 %  GEL Apply 2 Pump topically daily. 1 pump on each arm     warfarin (COUMADIN) 5 MG tablet TAKE 1/2 TABLET BY MOUTH ON THURSDAY AND SATURDAY, THEN 1 TABLET BY MOUTH ON SUNDAY, MONDAY, TUESDAY, WEDNESDAY AND FRIDAY 30 tablet 0   No current facility-administered medications for this encounter.   Allergies  Allergen Reactions   Penicillins Rash    Did it involve swelling of the face/tongue/throat, SOB, or low BP? No Did it involve sudden or severe rash/hives, skin peeling, or any reaction on the inside of your mouth or nose? No Did you need to seek medical attention at a hospital or doctor's  office? No When did it last happen?      30 + years If all above answers are "NO", may proceed with cephalosporin use.    Smoked < 1/2 ppd x many years. Quit 2-3 years ago  Social History   Socioeconomic History   Marital status: Divorced    Spouse name: Not on file   Number of children: 2   Years of education: 12   Highest education level: High school graduate  Occupational History   Not on file  Tobacco Use   Smoking status: Former    Pack years: 0.00    Types: Cigars    Quit date: 09/03/2017    Years since quitting: 3.1   Smokeless tobacco: Never  Vaping Use   Vaping Use: Never used  Substance and Sexual Activity   Alcohol use: No   Drug use: No   Sexual activity: Not Currently  Other Topics Concern   Not on file  Social History Narrative   Patient lives alone in [place].    Patient works out 4x a week on home exercise bike.    Patient plays guitar.    Social Determinants of Health   Financial Resource Strain: Low Risk    Difficulty of Paying Living Expenses: Not hard at all  Food Insecurity: No Food Insecurity   Worried About Charity fundraiser in the Last Year: Never true   [place] in the Last Year: Never true  Transportation Needs: No Transportation Needs   Lack of Transportation (Medical): No   Lack of Transportation (Non-Medical): No  Physical Activity:  Insufficiently Active   Days of Exercise per Week: 4 days   Minutes of Exercise per Session: 20 min  Stress: No Stress Concern Present   Feeling of Stress : Only a little  Social Connections: Socially Isolated   Frequency of Communication with Friends and Family: More than three times a week   Frequency of Social Gatherings with Friends and Family: More than three times a week   Attends Religious Services: Never   Marine scientist or Organizations: No   Attends Music therapist: Never   Marital Status: Divorced  Human resources officer Violence: Not At Risk   Fear of Current or Ex-Partner: No   Emotionally Abused: No   Physically Abused: No   Sexually Abused: No   Fhx: No FHX of premature CAD or SCD. Personally reviewed  BP 102/60   Pulse (!) 59   Wt 58.6 kg   SpO2 100%   BMI 17.05 kg/m   Wt Readings from Last 3 Encounters:  10/13/20 58.6 kg  09/10/20 57.2 kg  09/03/20 57.7 kg   PHYSICAL EXAM: General:  NAD. No resp difficulty, thin HEENT: Normal Neck: Supple. No JVD + v-waves. Carotids 2+ bilat; no bruits. No lymphadenopathy or thryomegaly appreciated. Cor: PMI nondisplaced. Irregular rate & rhythm. No rubs, gallops or murmurs, crisp mechanical valve sounds Lungs: Clear Abdomen: Soft, nontender, nondistended. No hepatosplenomegaly. No bruits or masses. Good bowel sounds. Extremities: No cyanosis, clubbing, rash, trace LE edema Neuro: Alert & oriented x 3, cranial nerves grossly intact. Moves all 4 extremities w/o difficulty. Flat affect.  ASSESSMENT & PLAN:   1.  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 RV severely dilated and HK. RVSP 55 - RHC 8/20 with significantly elevated biventricular pressures, moderate to severe mixed PAH and low output (CI 1.96) -  M-spike negative. PYP 1/21 negative for amyloid.  - Echo (6/22): newly reduced EF 25-30%, RV severely reduced,  moderate AR, mod/severe PR. - Chronic NYHA III, volume ok today. - Continue spiro 12.5 mg daily. - Continue lasix 40 mg daily, alternating with 20 mg every other day. - Continue to take metolazone as needed (carefully). - Encouraged compression hose. - Sleep study re-ordered. (He was  unable to complete previous one). - Referred to Cardiopulmonary Rehab.  - Advanced options very limited. 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 Not ideal candidate for palliative inotropes given infection risk with mechanical MV. - Unclear etiology for reduced EF. Schedule myoview to r/o high burden ischemia as he is not a cath candidate with CKD IV. - BMET today.  2. 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   - Stable on last echo. - On coumadin, INR followed by Coumadin clinic.  - Aware of need for SBE prophylaxis.    3.  Chronic a flutter  - Rate controlled, on Coumadin.    4.  Thoracic aneurysm, followed by Dr. Cyndia Bent  - CT chest 6/20 asc ao aorta 4.4 cm.   - Not surgical candidate.  5. Stage IV CKD - Followed by nephrology. - Baseline SCr~2.4. - BMET today.   6. 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 Negative for high-grade dysplasia/carcinoma - Followed routinely by GI w/ recs for f/u EGD 05/2021 - H/o GIB/ chronic anemia. Denies any recent gross bleeding. Recent CBC stable.  Follow up with Dr. Haroldine Laws as scheduled next month. We discussed myoview to evaluate for etiology for newly reduced EF.  [place], FNP  10/13/20.

## 2020-10-13 ENCOUNTER — Ambulatory Visit (HOSPITAL_COMMUNITY)
Admission: RE | Admit: 2020-10-13 | Discharge: 2020-10-13 | Disposition: A | Discharge status: Home / Self Care | Payer: Medicare HMO | Source: Ambulatory Visit | Attending: Family Medicine | Admitting: Family Medicine

## 2020-10-13 ENCOUNTER — Other Ambulatory Visit: Payer: Self-pay

## 2020-10-13 ENCOUNTER — Encounter (HOSPITAL_COMMUNITY): Payer: Self-pay

## 2020-10-13 VITALS — BP 102/60 | HR 59 | Wt 129.2 lb

## 2020-10-13 DIAGNOSIS — R69 Illness, unspecified: Secondary | ICD-10-CM | POA: Diagnosis not present

## 2020-10-13 DIAGNOSIS — B1920 Unspecified viral hepatitis C without hepatic coma: Secondary | ICD-10-CM | POA: Insufficient documentation

## 2020-10-13 DIAGNOSIS — Z87891 Personal history of nicotine dependence: Secondary | ICD-10-CM | POA: Insufficient documentation

## 2020-10-13 DIAGNOSIS — I2781 Cor pulmonale (chronic): Secondary | ICD-10-CM

## 2020-10-13 DIAGNOSIS — I5033 Acute on chronic diastolic (congestive) heart failure: Secondary | ICD-10-CM | POA: Diagnosis not present

## 2020-10-13 DIAGNOSIS — I712 Thoracic aortic aneurysm, without rupture, unspecified: Secondary | ICD-10-CM

## 2020-10-13 DIAGNOSIS — I272 Pulmonary hypertension, unspecified: Secondary | ICD-10-CM | POA: Insufficient documentation

## 2020-10-13 DIAGNOSIS — I4892 Unspecified atrial flutter: Secondary | ICD-10-CM | POA: Insufficient documentation

## 2020-10-13 DIAGNOSIS — Z952 Presence of prosthetic heart valve: Secondary | ICD-10-CM | POA: Insufficient documentation

## 2020-10-13 DIAGNOSIS — I4891 Unspecified atrial fibrillation: Secondary | ICD-10-CM | POA: Insufficient documentation

## 2020-10-13 DIAGNOSIS — Z7901 Long term (current) use of anticoagulants: Secondary | ICD-10-CM | POA: Insufficient documentation

## 2020-10-13 DIAGNOSIS — N184 Chronic kidney disease, stage 4 (severe): Secondary | ICD-10-CM

## 2020-10-13 DIAGNOSIS — Z8719 Personal history of other diseases of the digestive system: Secondary | ICD-10-CM

## 2020-10-13 DIAGNOSIS — Z8249 Family history of ischemic heart disease and other diseases of the circulatory system: Secondary | ICD-10-CM | POA: Insufficient documentation

## 2020-10-13 DIAGNOSIS — Z79899 Other long term (current) drug therapy: Secondary | ICD-10-CM | POA: Diagnosis not present

## 2020-10-13 DIAGNOSIS — I5042 Chronic combined systolic (congestive) and diastolic (congestive) heart failure: Secondary | ICD-10-CM

## 2020-10-13 DIAGNOSIS — I5032 Chronic diastolic (congestive) heart failure: Secondary | ICD-10-CM | POA: Diagnosis not present

## 2020-10-13 DIAGNOSIS — I059 Rheumatic mitral valve disease, unspecified: Secondary | ICD-10-CM

## 2020-10-13 DIAGNOSIS — I130 Hypertensive heart and chronic kidney disease with heart failure and stage 1 through stage 4 chronic kidney disease, or unspecified chronic kidney disease: Secondary | ICD-10-CM | POA: Diagnosis not present

## 2020-10-13 LAB — BASIC METABOLIC PANEL
Anion gap: 10 (ref 5–15)
BUN: 43 mg/dL — ABNORMAL HIGH (ref 8–23)
CO2: 26 mmol/L (ref 22–32)
Calcium: 9.3 mg/dL (ref 8.9–10.3)
Chloride: 99 mmol/L (ref 98–111)
Creatinine, Ser: 2.34 mg/dL — ABNORMAL HIGH (ref 0.61–1.24)
GFR, Estimated: 30 mL/min — ABNORMAL LOW (ref >60)
Glucose, Bld: 102 mg/dL — ABNORMAL HIGH (ref 70–99)
Potassium: 3.8 mmol/L (ref 3.5–5.1)
Sodium: 135 mmol/L (ref 135–145)

## 2020-10-13 NOTE — Patient Instructions (Addendum)
Labs done today. We will contact you only if your labs are abnormal.  No other medication changes were made. Please continue all current medications as prescribed.   Your physician has requested that you have en exercise stress test. [address] will contact you to schedule this appointment. See below for instructions.   Your provider has recommended that you have a home sleep study.  We have provided you with the equipment in our office today. Please download the app and follow the instructions. YOUR PIN NUMBER IS: [id number]. Once you have completed the test you just dispose of the equipment, the information is automatically uploaded to [place] via blue-tooth technology. If your test is positive for sleep apnea and you need a home CPAP machine you will be contacted by Dr Theodosia Blender office [facility]) to set this up.  Your physician recommends that you keep your pending appointment with Dr. Haroldine Laws on Monday August 29th 2022 at 11:40am. GATE [id number]  If you have any questions or concerns before your next appointment please send [place] a message through [place] or call our office at [phone].    TO LEAVE A MESSAGE FOR THE NURSE SELECT OPTION 2, PLEASE LEAVE A MESSAGE INCLUDING: YOUR NAME DATE OF BIRTH CALL BACK NUMBER REASON FOR CALL**this is important as we prioritize the call backs  YOU WILL RECEIVE A CALL BACK THE SAME DAY AS LONG AS YOU CALL BEFORE 4:00 PM   Do the following things EVERYDAY: Weigh yourself in the morning before breakfast. Write it down and keep it in a log. Take your medicines as prescribed Eat low salt foods--Limit salt (sodium) to 2000 mg per day.  Stay as active as you can everyday Limit all fluids for the day to less than 2 liters   At the [facility], you and your health needs are our priority. As part of our continuing mission to provide you with exceptional heart care, we have created designated Provider Care Teams. These Care Teams include your  primary Cardiologist (physician) and Advanced Practice Providers (APPs- Physician Assistants and Nurse Practitioners) who all work together to provide you with the care you need, when you need it.   You may see any of the following providers on your designated Care Team at your next follow up: Dr Glori Bickers Dr Haynes Kerns, NP Lyda Jester, [place] Audry Riles, PharmD   Please be sure to bring in all your medications bottles to every appointment.   How to Prepare for Your Myoview Test (stress test):  Please do not take these medications before your test: Lasix. Your remaining medications may be taken with water. Nothing to eat or drink, except water, 4 hours prior to arrival time.  NO caffeine/decaffeinated products, or chocolate 12 hours prior to arrival. [place], please do not wear dresses.  Skirts or pants are approprate, please wear a short sleeve shirt. NO perfume, cologne or lotion Wear comfortable walking shoes.  NO HEELS! Total time is 3 to 4 hours; you may want to bring reading material for the waiting time. Please report to [facility] for your test  What to expect after you arrive:  Once you arrive and check in for your appointment an IV will be started in your arm.  Then the Technoligist will inject a small amount of radioactive tracer.  There will be a 1 hour waiting period after this injection.  A series of pictures will be taken of your heart following this waiting period.  You will  be prepped for the stress portion of the test.  During the stress portion of your test you will either walk on a treadmill or receive a small, safe amount of radioactive tracer injected in your IV.  After the stress portion, there is a short rest period during which time your heart and blood pressure will be monitored.  After the short rest period the Technologist will begin your second set of pictures.  Your doctor will inform you of your test results within 7-10 business  days.  In preparation for your appointment, medication and supplies will be purchased.  Appointment availability is limited, so if you need to cancel or reschedule please call the office at [phone] 24 hours in advance to avoid a cancellation fee of $100.00

## 2020-10-13 NOTE — Progress Notes (Signed)
ITAMAR home sleep study given to patient, all instructions explained, and CLOUDPAT registration complete.  

## 2020-10-13 NOTE — Addendum Note (Signed)
Encounter addended by: Stanford Scotland, RN on: 10/13/2020 2:20 PM  Actions taken: Clinical Note Signed

## 2020-10-15 ENCOUNTER — Encounter (HOSPITAL_BASED_OUTPATIENT_CLINIC_OR_DEPARTMENT_OTHER): Payer: Medicare HMO | Admitting: Cardiology

## 2020-10-15 ENCOUNTER — Encounter: Payer: Self-pay | Admitting: Cardiology

## 2020-10-15 DIAGNOSIS — G4733 Obstructive sleep apnea (adult) (pediatric): Secondary | ICD-10-CM

## 2020-10-20 ENCOUNTER — Telehealth (HOSPITAL_COMMUNITY): Payer: Self-pay

## 2020-10-20 ENCOUNTER — Telehealth (HOSPITAL_COMMUNITY): Payer: Self-pay | Admitting: *Deleted

## 2020-10-20 NOTE — Telephone Encounter (Signed)
Pt insurance is active and benefits verified through [facility] Co-pay $30.00, DED $0.00/$0.00 met, out of pocket $4,500.00/$827.68 met, co-insurance 0%. No pre-authorization required. Ann/[employer] Medicare, 10/20/20 @ 12:13PM, KDT#[id number]   Will contact patient to see if he is interested in the Pulmonary Rehab Program.

## 2020-10-20 NOTE — Procedures (Signed)
   Sleep Study Report  Patient Information Study Date: 10/15/20 Patient Name: [name] Patient ID: [id number] Birth Date: 06/17/2050 Age: 70 Gender: Male Referring Physician: Glori Bickers, MD  TEST DESCRIPTION: Home sleep apnea testing was completed using the WatchPat, a Type 1 device, utilizing  peripheral arterial tonometry (PAT), chest movement, actigraphy, pulse oximetry, pulse rate, body position and snore.  AHI was calculated with apnea and hypopnea using valid sleep time as the denominator. RDI includes apneas,  hypopneas, and RERAs. The data acquired and the scoring of sleep and all associated events were performed in  accordance with the recommended standards and specifications as outlined in the AASM Manual for the Scoring of  Sleep and Associated Events 2.2.0 (2015).  FINDINGS: 1. Severe Obstructive Sleep Apnea with AHI 39.4/hr.  2. Minimal Central Sleep Apnea with pAHIc 6.4/hr. 3. Oxygen desaturations as low as 82%. 4. Severe snoring was present. O2 sats were < 88% for 0.1 min. 5. Total sleep time was 4 hrs and 49 min. 6. 9.8% of total sleep time was spent in REM sleep.  7. Normal sleep onset latency at 23 min.  8. Prolonged REM sleep onset latency at 413 min.  9. Total awakenings were 48.   DIAGNOSIS:  Severe Obstructive Sleep Apnea (G47.33)  RECOMMENDATIONS:  1. Clinical correlation of these findings is necessary. The decision to treat obstructive sleep apnea (OSA) is usually  based on the presence of apnea symptoms or the presence of associated medical conditions such as Hypertension,  Congestive Heart Failure, Atrial Fibrillation or Obesity. The most common symptoms of OSA are snoring, gasping for  breath while sleeping, daytime sleepiness and fatigue.   2. Initiating apnea therapy is recommended given the presence of symptoms and/or associated conditions.   Recommend proceeding with one of the following:   a. Auto-CPAP therapy with a pressure range  of 5-20cm H2O.   b. An oral appliance (OA) that can be obtained from certain dentists with expertise in sleep medicine. These are  primarily of use in non-obese patients with mild and moderate disease.   c. An ENT consultation which may be useful to look for specific causes of obstruction and possible treatment  options.   d. If patient is intolerant to PAP therapy, consider referral to ENT for evaluation for hypoglossal nerve stimulator.   3. Close follow-up is necessary to ensure success with CPAP or oral appliance therapy for maximum benefit .  4. A follow-up oximetry study on CPAP is recommended to assess the adequacy of therapy and determine the need  for supplemental oxygen or the potential need for Bi-level therapy. An arterial blood gas to determine the adequacy of  baseline ventilation and oxygenation should also be considered.  5. Healthy sleep recommendations include: adequate nightly sleep (normal 7-9 hrs/night), avoidance of caffeine after  noon and alcohol near bedtime, and maintaining a sleep environment that is cool, dark and quiet.  6. Weight loss for overweight patients is recommended. Even modest amounts of weight loss can significantly  improve the severity of sleep apnea.  7. Snoring recommendations include: weight loss where appropriate, side sleeping, and avoidance of alcohol before  bed.  Signature: Electronically Signed: 10/20/20 Fransico Him, MD; [facility]; [place], American Board of Sleep Medicine

## 2020-10-20 NOTE — Telephone Encounter (Signed)
Called patient to see if he is interested in the Pulmonary Rehab Program. Patient expressed interest. Explained scheduling process and went over insurance, patient verbalized understanding. Also adv pt where we are with scheduling for PR and that we have a back log. (1-3 months) 

## 2020-10-21 ENCOUNTER — Telehealth: Payer: Self-pay | Admitting: *Deleted

## 2020-10-21 DIAGNOSIS — G4733 Obstructive sleep apnea (adult) (pediatric): Secondary | ICD-10-CM

## 2020-10-21 NOTE — Telephone Encounter (Signed)
-----   Message from Sueanne Margarita, MD sent at 10/20/2020 10:44 AM EDT ----- Please let patient know that they have sleep apnea.  Recommend therapeutic CPAP titration for treatment of patient's sleep disordered breathing.  If unable to perform an in lab titration then initiate ResMed auto CPAP from 4 to 15cm H2O with heated humidity and mask of choice and overnight pulse ox on CPAP.

## 2020-10-21 NOTE — Telephone Encounter (Signed)
The patient has been notified of the result and verbalized understanding.  All questions (if any) were answered. [name], [place] 10/21/2020 2:50 PM    Titration to be precerted  Per Quenten Raven at [facility]  Ref# [id number]  Fax# [phone]

## 2020-10-27 NOTE — Telephone Encounter (Signed)
Prior Authorization for titration sent to [employer] via web portal. Auth Number [id number] Valid dates 10-24-20  04/22/2021.Marland Kitchen

## 2020-10-29 ENCOUNTER — Telehealth (HOSPITAL_COMMUNITY): Payer: Self-pay | Admitting: *Deleted

## 2020-10-29 ENCOUNTER — Encounter (HOSPITAL_COMMUNITY): Payer: Self-pay | Admitting: *Deleted

## 2020-10-29 NOTE — Telephone Encounter (Signed)
Patient given detailed instructions per Myocardial Perfusion Study Information Sheet for the test on 11/05/20 Patient notified to arrive 15 minutes early and that it is imperative to arrive on time for appointment to keep from having the test rescheduled.  If you need to cancel or reschedule your appointment, please call the office within 24 hours of your appointment. . Patient verbalized understanding.  Letter sent with instructions to mychart per patient request.  Kirstie Peri

## 2020-10-31 ENCOUNTER — Other Ambulatory Visit: Payer: Self-pay | Admitting: Student in an Organized Health Care Education/Training Program

## 2020-10-31 ENCOUNTER — Other Ambulatory Visit: Payer: Self-pay | Admitting: Family Medicine

## 2020-10-31 ENCOUNTER — Telehealth (HOSPITAL_COMMUNITY): Payer: Self-pay

## 2020-10-31 NOTE — Telephone Encounter (Signed)
Called patient to see if he was interested in participating in the Pulmonary Rehab Program. Patient stated yes. Patient will come in for orientation on 11/10/20 @ 10:30AM and will attend the 10:15AM exercise class.   Tourist information centre manager.

## 2020-11-04 ENCOUNTER — Ambulatory Visit: Payer: Medicare HMO

## 2020-11-04 DIAGNOSIS — G4733 Obstructive sleep apnea (adult) (pediatric): Secondary | ICD-10-CM

## 2020-11-04 NOTE — Procedures (Signed)
   Sleep Study Report  Patient Information Study Date: Oct 15, 2020 Patient Name: [name] Patient ID: [id number] Birth Date: [birth year] Age: 70 Referring Physician: Daneen Schick, MD  TEST DESCRIPTION: Home sleep apnea testing was completed using the WatchPat, a Type 1 device, utilizing peripheral arterial tonometry (PAT), chest movement, actigraphy, pulse oximetry, pulse rate, body position and snore. AHI was calculated with apnea and hypopnea using valid sleep time as the denominator. RDI includes apneas, hypopneas, and RERAs. The data acquired and the scoring of sleep and all associated events were performed in accordance with the recommended standards and specifications as outlined in the AASM Manual for the Scoring of Sleep and Associated Events 2.2.0 (2015).  FINDINGS: 1. Severe Obstructive Sleep Apnea with AHI 42/hr. 2. Minimal Central Sleep Apnea with pAHIc 6.5/hr. 3. Oxygen desaturations as low as 82%. 4. Severe snoring was present. O2 sats were < 88% for 0.1 min. 5. Total sleep time was 4 hrs and 28 min. 6. 10.4% of total sleep time was spent in REM sleep. 7. Normal sleep onset latency at 22 min. 8. Prolonged REM sleep onset latency at 414 min. 9. Total awakenings were 51.  DIAGNOSIS: Severe Obstructive Sleep Apnea (G47.33)  RECOMMENDATIONS:  1. Clinical correlation of these findings is necessary. The decision to treat obstructive sleep apnea (OSA) is usually based on the presence of apnea symptoms or the presence of associated medical conditions such as Hypertension, Congestive Heart Failure, Atrial Fibrillation or Obesity. The most common symptoms of OSA are snoring, gasping for breath while sleeping, daytime sleepiness and fatigue.  2. Initiating apnea therapy is recommended given the presence of symptoms and/or associated conditions. Recommend proceeding with one of the following:   a. Auto-CPAP therapy with a pressure range of 5-20cm H2O.   b. An oral  appliance (OA) that can be obtained from certain dentists with expertise in sleep medicine. These are primarily of use in non-obese patients with mild and moderate disease.   c. An ENT consultation which may be useful to look for specific causes of obstruction and possible treatment options.   d. If patient is intolerant to PAP therapy, consider referral to ENT for evaluation for hypoglossal nerve stimulator.  3. Close follow-up is necessary to ensure success with CPAP or oral appliance therapy for maximum benefit .  4. A follow-up oximetry study on CPAP is recommended to assess the adequacy of therapy and determine the need for supplemental oxygen or the potential need for Bi-level therapy. An arterial blood gas to determine the adequacy of baseline ventilation and oxygenation should also be considered.  5. Healthy sleep recommendations include: adequate nightly sleep (normal 7-9 hrs/night), avoidance of caffeine after noon and alcohol near bedtime, and maintaining a sleep environment that is cool, dark and quiet.  6. Weight loss for overweight patients is recommended. Even modest amounts of weight loss can significantly improve the severity of sleep apnea.  7. Snoring recommendations include: weight loss where appropriate, side sleeping, and avoidance of alcohol before bed.  8. Operation of motor vehicle or dangerous equipment must be avoided when feeling drowsy, excessively sleepy, or mentally fatigued.  Signature: Electronically Signed: Nov 04, 2020 Fransico Him, MD; [facility]; [place], [place] Board of Sleep Medicine Report prepared by: Fransico Him, MD

## 2020-11-05 ENCOUNTER — Other Ambulatory Visit: Payer: Self-pay

## 2020-11-05 ENCOUNTER — Ambulatory Visit (HOSPITAL_COMMUNITY): Payer: Medicare HMO | Attending: Cardiology

## 2020-11-05 DIAGNOSIS — I5042 Chronic combined systolic (congestive) and diastolic (congestive) heart failure: Secondary | ICD-10-CM | POA: Diagnosis not present

## 2020-11-05 LAB — MYOCARDIAL PERFUSION IMAGING
LV dias vol: 178 mL (ref 62–150)
LV sys vol: 81 mL
Peak HR: 105 {beats}/min
Rest HR: 73 {beats}/min
SDS: 4
SRS: 2
SSS: 6
TID: 1.05

## 2020-11-05 MED ORDER — REGADENOSON 0.4 MG/5ML IV SOLN
0.4000 mg | Freq: Once | INTRAVENOUS | Status: AC
Start: 2020-11-05 — End: 2020-11-05
  Administered 2020-11-05: 0.4 mg via INTRAVENOUS

## 2020-11-05 MED ORDER — TECHNETIUM TC 99M TETROFOSMIN IV KIT
10.4000 | PACK | Freq: Once | INTRAVENOUS | Status: AC | PRN
Start: 2020-11-05 — End: 2020-11-05
  Administered 2020-11-05: 10.4 via INTRAVENOUS
  Filled 2020-11-05: qty 11

## 2020-11-05 MED ORDER — TECHNETIUM TC 99M TETROFOSMIN IV KIT
31.1000 | PACK | Freq: Once | INTRAVENOUS | Status: AC | PRN
  Administered 2020-11-05: 31.1 via INTRAVENOUS
  Filled 2020-11-05: qty 32

## 2020-11-05 NOTE — Addendum Note (Signed)
Encounter addended by: Rafael Bihari, FNP on: 11/05/2020 7:12 PM  Actions taken: Clinical Note Signed

## 2020-11-06 ENCOUNTER — Telehealth (HOSPITAL_COMMUNITY): Payer: Self-pay | Admitting: *Deleted

## 2020-11-06 NOTE — Addendum Note (Signed)
Addended by: Freada Bergeron on: 11/06/2020 01:17 PM   Modules accepted: Orders

## 2020-11-07 ENCOUNTER — Other Ambulatory Visit: Payer: Self-pay

## 2020-11-07 ENCOUNTER — Ambulatory Visit: Payer: Medicare HMO

## 2020-11-07 DIAGNOSIS — I059 Rheumatic mitral valve disease, unspecified: Secondary | ICD-10-CM

## 2020-11-07 DIAGNOSIS — Z7901 Long term (current) use of anticoagulants: Secondary | ICD-10-CM | POA: Diagnosis not present

## 2020-11-07 LAB — POCT INR: INR: 2.3 (ref 2.0–3.0)

## 2020-11-07 NOTE — Patient Instructions (Addendum)
Description   Take 1 1/2 tablets tonight and then continue taking Warfarin 1 tablet daily except for 1/2 tablet on Sunday, Tuesday and Thursday. Recheck INR in 3 weeks. [facility] [phone].

## 2020-11-10 ENCOUNTER — Other Ambulatory Visit: Payer: Self-pay

## 2020-11-10 ENCOUNTER — Encounter (HOSPITAL_COMMUNITY): Payer: Self-pay

## 2020-11-10 ENCOUNTER — Encounter (HOSPITAL_COMMUNITY)
Admission: RE | Admit: 2020-11-10 | Discharge: 2020-11-10 | Disposition: A | Discharge status: Home / Self Care | Payer: Medicare HMO | Source: Ambulatory Visit | Attending: Internal Medicine | Admitting: Internal Medicine

## 2020-11-10 VITALS — BP 86/50 | HR 80 | Ht 71.25 in | Wt 127.2 lb

## 2020-11-10 DIAGNOSIS — I5032 Chronic diastolic (congestive) heart failure: Secondary | ICD-10-CM | POA: Insufficient documentation

## 2020-11-10 NOTE — Progress Notes (Signed)
Pulmonary Rehab Orientation Physical Assessment Note  Physical assessment reveals thin appearance male who is alert and oriented x 3.  Heart rate is normal, breath sounds clear to auscultation, no wheezes, rales, or rhonchi. Reports no cough. Bowel sounds present.  Pt denies  abdominal discomfort, nausea, vomiting or diarrhea. Pt reports poor appetite and would like to gain some weight. Grip strength equal, strong. Distal pulses palpable with trace swelling on the left lower leg and ankle.  Trentan is wearing compression socks. Cherre Huger, BSN Cardiac and Training and development officer

## 2020-11-10 NOTE — Progress Notes (Signed)
[name] 70 y.o. male Pulmonary Rehab Orientation Note This patient who was referred to Pulmonary rehab by Dr. Haroldine Laws with the diagnosis of Chronic diastolic heart failure arrived today in Cardiac and Pulmonary Rehab. He arrived ambulatory with slow but steady gait. He does not carry portable oxygen. Per pt, he uses oxygen never. However, he is awaiting a sleep study to see if he is going to require a CPAP machine with oxygen. Color good, skin warm and dry. Patient is oriented to time and place. Patient's medical history, psychosocial health, and medications reviewed. Psychosocial assessment reveals pt lives alone. Pt is currently retired. Pt hobbies include playing on computers. Pt reports his stress level is low. Areas of stress/anxiety include Health.  Pt does exhibit signs of depression. However, pt states he does not feel depressed but recognizes that he does feel "down" at times. Signs of depression include  poor appetite  and difficulty maintaining sleep. PHQ2/9 score 1/5. Pt shows fair coping skills with positive outlook. Wylee was offered emotional support and reassurance. Will continue to monitor and evaluate progress toward psychosocial goal(s) of improved quality of life throughout participation in pulmonary rehab and decrease in PHQ9 score. Patient reports he does take medications as prescribed. Patient states he follows a Regular diet, however he has a very poor appetite and does not eat much.  The patient reports he needs to gain weight but due to poor appetite he has not been successful in doing so. Patient's weight will be monitored closely. Demonstration and practice of PLB using pulse oximeter. Patient able to return demonstration satisfactorily. Safety and hand hygiene in the exercise area reviewed with patient. Patient voices understanding of the information reviewed. Department expectations discussed with patient and achievable goals were set. The patient shows enthusiasm about  attending the program and we look forward to working with this nice gentleman. The patient completed a 6 min walk test today and to begin exercise on 11/18/20.  45 minutes was spent on a variety of activities such as assessment of the patient, obtaining baseline data including height, weight, BMI, and grip strength, verifying medical history, allergies, and current medications, and teaching patient strategies for performing tasks with less respiratory effort with emphasis on pursed lip breathing.  Rick Duff MS, ACSM CEP 4:05 PM 11/10/2020

## 2020-11-10 NOTE — Progress Notes (Signed)
Pulmonary Individual Treatment Plan  Patient Details  Name: [name] MRN: [id number] Date of Birth: 06-11-1950 Referring Provider:   April Manson Pulmonary Rehab Walk Test from 11/10/2020 in [place]  Referring Provider Dr. Haroldine Laws       Initial Encounter Date:  Flowsheet Row Pulmonary Rehab Walk Test from 11/10/2020 in [place]  Date 11/10/20       Visit Diagnosis: Chronic diastolic heart failure ([place])  Patient's Home Medications on Admission:   Current Outpatient Medications:    amoxicillin (AMOXIL) 500 MG capsule, TAKE 4 CAPSULES BY MOUTH 1 HOUR BEFORE DENTAL APPOINTMENT, Disp: 4 capsule, Rfl: 1   Efinaconazole 10 % SOLN, Apply to toenail daily for 48 weeks, Disp: 8 mL, Rfl: 3   enoxaparin (LOVENOX) 60 MG/0.6ML injection, Inject 0.6 mLs (60 mg total) into the skin daily., Disp: 6 mL, Rfl: 1   ferrous sulfate 325 (65 FE) MG tablet, Take 1 tablet (325 mg total) by mouth every other day. In the evening. (Patient taking differently: Take 325 mg by mouth every other day. In the evening.), Disp: , Rfl: 3   furosemide (LASIX) 40 MG tablet, Take one tablet by mouth every other day, alternating with taking a half tablet (20mg  total) every other day (Patient taking differently: 20 mg. Take one tablet by mouth every other day, alternating with taking a half tablet (20mg  total) every other day), Disp: 68 tablet, Rfl: 3   metolazone (ZAROXOLYN) 2.5 MG tablet, Take as directed by the heart failure clinic (Patient taking differently: Take 2.5 mg by mouth daily as needed (Swelling). Take as directed by the heart failure clinic), Disp: 30 tablet, Rfl: 2   polyethylene glycol (MIRALAX) 17 g packet, Take 17 g by mouth daily as needed. (Patient taking differently: Take 17 g by mouth daily.), Disp: 14 each, Rfl: 0   potassium chloride SA (KLOR-CON) 20 MEQ tablet, Take 1 tablet (20 mEq total) by mouth daily., Disp: 90 tablet, Rfl:  3   sildenafil (REVATIO) 20 MG tablet, Take 20 mg by mouth daily as needed (ED)., Disp: , Rfl:    spironolactone (ALDACTONE) 25 MG tablet, TAKE 1/2 TABLET(12.5 MG) BY MOUTH DAILY, Disp: 15 tablet, Rfl: 3   Testosterone 1.62 % GEL, Apply 2 Pump topically daily. 1 pump on each arm, Disp: , Rfl:    warfarin (COUMADIN) 5 MG tablet, TAKE 1/2 TABLET BY MOUTH ON THURSDAY AND SATURDAY, AND THEN 1 TABLET ON SUNDAY, MONDAY, TUESDAY, WEDNESDAY, AND FRIDAY, Disp: 77 tablet, Rfl: 1  Past Medical History: Past Medical History:  Diagnosis Date   Allergy    Aneurysm of ascending aorta (HCC)    Atrial fibrillation (HCC)    BPH (benign prostatic hyperplasia)    Colon polyps    FHx: rheumatic heart disease    H/O diplopia    Hepatitis C    Hypertension    Hypogonadism male    Mitral valve disease    mitral valve repair/replacement x3    Tobacco Use: Social History   Tobacco Use  Smoking Status Former   Types: Cigars   Quit date: 09/03/2017   Years since quitting: 3.1  Smokeless Tobacco Never    Labs: Recent Review Flowsheet Data     Labs for ITP Cardiac and Pulmonary Rehab Latest Ref Rng & Units 10/23/2019 11/26/2019 05/07/2020 05/07/2020 05/07/2020   PHART 7.350 - 7.450 - - 7.160(LL) 7.272(L) -   PCO2ART 32.0 - 48.0 mmHg - - 68.8(HH)  57.3(H) -   HCO3 20.0 - 28.0 mmol/L 23.6 - 23.5 25.6 23.0   TCO2 22 - 32 mmol/L 25 25 - - -   ACIDBASEDEF 0.0 - 2.0 mmol/L 2.0 - 5.4(H) 1.1 2.8(H)   O2SAT % 54.0 - 44.8 26.9 49.2       Capillary Blood Glucose: Lab Results  Component Value Date   GLUCAP 140 (H) 05/07/2020     Pulmonary Assessment Scores:  Pulmonary Assessment Scores     Row Name 11/10/20 1544         ADL UCSD   ADL Phase Entry     SOB Score total 41           CAT Score     CAT Score 13           mMRC Score     mMRC Score 3            UCSD: Self-administered rating of dyspnea associated with activities of daily living (ADLs) 6-point scale (0 = "not at all" to 5 =  "maximal or unable to do because of breathlessness")  Scoring Scores range from 0 to 120.  Minimally important difference is 5 units  CAT: CAT can identify the health impairment of COPD patients and is better correlated with disease progression.  CAT has a scoring range of zero to 40. The CAT score is classified into four groups of low (less than 10), medium (10 - 20), high (21-30) and very high (31-40) based on the impact level of disease on health status. A CAT score over 10 suggests significant symptoms.  A worsening CAT score could be explained by an exacerbation, poor medication adherence, poor inhaler technique, or progression of COPD or comorbid conditions.  CAT MCID is 2 points  mMRC: mMRC (Modified Medical Research Council) Dyspnea Scale is used to assess the degree of baseline functional disability in patients of respiratory disease due to dyspnea. No minimal important difference is established. A decrease in score of 1 point or greater is considered a positive change.   Pulmonary Function Assessment:  Pulmonary Function Assessment - 11/10/20 1055       Breath   Shortness of Breath Yes;Limiting activity;Fear of Shortness of Breath             Exercise Target Goals: Exercise Program Goal: Individual exercise prescription set using results from initial 6 min walk test and THRR while considering  patient's activity barriers and safety.   Exercise Prescription Goal: Initial exercise prescription builds to 30-45 minutes a day of aerobic activity, 2-3 days per week.  Home exercise guidelines will be given to patient during program as part of exercise prescription that the participant will acknowledge.  Activity Barriers & Risk Stratification:  Activity Barriers & Cardiac Risk Stratification - 11/10/20 1052       Activity Barriers & Cardiac Risk Stratification   Activity Barriers Shortness of Breath;Neck/Spine Problems;Deconditioning             6 Minute Walk:  6  Minute Walk     Row Name 11/10/20 1529         6 Minute Walk   Phase Initial     Distance 1011 feet     Walk Time 6 minutes     # of Rest Breaks 0     MPH 1.91     METS 3.33     RPE 13     Perceived Dyspnea  3     Symptoms Yes (comment)  Comments Pt stopped walk test at 5:30 due to SOB, stated he could not go any further     Resting HR 71 bpm     Resting BP 85/50     Resting Oxygen Saturation  98 %     Exercise Oxygen Saturation  during 6 min walk 94 %     Max Ex. HR 115 bpm     Max Ex. BP 126/64     2 Minute Post BP 120/64           Interval HR     1 Minute HR 87     2 Minute HR 96     3 Minute HR 105     4 Minute HR 110     5 Minute HR 115     6 Minute HR 110     2 Minute Post HR 82     Interval Heart Rate? Yes           Interval Oxygen     Interval Oxygen? Yes     Baseline Oxygen Saturation % 98 %     1 Minute Oxygen Saturation % 98 %     1 Minute Liters of Oxygen 0 L     2 Minute Oxygen Saturation % 96 %     2 Minute Liters of Oxygen 0 L     3 Minute Oxygen Saturation % 95 %     3 Minute Liters of Oxygen 0 L     4 Minute Oxygen Saturation % 94 %     4 Minute Liters of Oxygen 0 L     5 Minute Oxygen Saturation % 94 %     5 Minute Liters of Oxygen 0 L     6 Minute Oxygen Saturation % 95 %     6 Minute Liters of Oxygen 0 L     2 Minute Post Oxygen Saturation % 98 %     2 Minute Post Liters of Oxygen 0 L             Oxygen Initial Assessment:  Oxygen Initial Assessment - 11/10/20 1054       Home Oxygen   Home Oxygen Device None    Sleep Oxygen Prescription None    Home Exercise Oxygen Prescription None    Home Resting Oxygen Prescription None      Initial 6 min Walk   Oxygen Used None      Program Oxygen Prescription   Program Oxygen Prescription None      Intervention   Short Term Goals To learn and understand importance of monitoring SPO2 with pulse oximeter and demonstrate accurate use of the pulse oximeter.;To learn and understand  importance of maintaining oxygen saturations>88%;To learn and demonstrate proper pursed lip breathing techniques or other breathing techniques. ;To learn and demonstrate proper use of respiratory medications    Long  Term Goals Verbalizes importance of monitoring SPO2 with pulse oximeter and return demonstration;Maintenance of O2 saturations>88%;Exhibits proper breathing techniques, such as pursed lip breathing or other method taught during program session;Compliance with respiratory medication;Demonstrates proper use of MDI's             Oxygen Re-Evaluation:   Oxygen Discharge (Final Oxygen Re-Evaluation):   Initial Exercise Prescription:  Initial Exercise Prescription - 11/10/20 1500       Date of Initial Exercise RX and Referring Provider   Date 11/10/20    Referring Provider Dr. Haroldine Laws    Expected Discharge Date 01/15/21  NuStep   Level 2    SPM 80    Minutes 15      Arm Ergometer   Level 1.5    RPM 40    Minutes 15      Prescription Details   Frequency (times per week) 2    Duration Progress to 30 minutes of continuous aerobic without signs/symptoms of physical distress      Intensity   THRR 40-80% of Max Heartrate 61-122    Ratings of Perceived Exertion 11-13    Perceived Dyspnea 0-4      Progression   Progression Continue to progress workloads to maintain intensity without signs/symptoms of physical distress.      Resistance Training   Training Prescription Yes    Weight red bands    Reps 10-15             Perform Capillary Blood Glucose checks as needed.  Exercise Prescription Changes:   Exercise Comments:   Exercise Goals and Review:   Exercise Goals     Row Name 11/10/20 1155             Exercise Goals   Increase Physical Activity Yes       Intervention Provide advice, education, support and counseling about physical activity/exercise needs.;Develop an individualized exercise prescription for aerobic and resistive training  based on initial evaluation findings, risk stratification, comorbidities and participant's personal goals.       Expected Outcomes Short Term: Attend rehab on a regular basis to increase amount of physical activity.;Long Term: Add in home exercise to make exercise part of routine and to increase amount of physical activity.;Long Term: Exercising regularly at least 3-5 days a week.       Increase Strength and Stamina Yes       Intervention Provide advice, education, support and counseling about physical activity/exercise needs.;Develop an individualized exercise prescription for aerobic and resistive training based on initial evaluation findings, risk stratification, comorbidities and participant's personal goals.       Expected Outcomes Short Term: Increase workloads from initial exercise prescription for resistance, speed, and METs.;Short Term: Perform resistance training exercises routinely during rehab and add in resistance training at home;Long Term: Improve cardiorespiratory fitness, muscular endurance and strength as measured by increased METs and functional capacity (6MWT)       Able to understand and use rate of perceived exertion (RPE) scale Yes       Intervention Provide education and explanation on how to use RPE scale       Expected Outcomes Short Term: Able to use RPE daily in rehab to express subjective intensity level;Long Term:  Able to use RPE to guide intensity level when exercising independently       Able to understand and use Dyspnea scale Yes       Intervention Provide education and explanation on how to use Dyspnea scale       Expected Outcomes Short Term: Able to use Dyspnea scale daily in rehab to express subjective sense of shortness of breath during exertion;Long Term: Able to use Dyspnea scale to guide intensity level when exercising independently       Knowledge and understanding of Target Heart Rate Range (THRR) Yes       Intervention Provide education and explanation of  THRR including how the numbers were predicted and where they are located for reference       Expected Outcomes Short Term: Able to state/look up THRR;Long Term: Able to use THRR to govern  intensity when exercising independently;Short Term: Able to use daily as guideline for intensity in rehab       Understanding of Exercise Prescription Yes       Intervention Provide education, explanation, and written materials on patient's individual exercise prescription       Expected Outcomes Short Term: Able to explain program exercise prescription;Long Term: Able to explain home exercise prescription to exercise independently                Exercise Goals Re-Evaluation :   Discharge Exercise Prescription (Final Exercise Prescription Changes):   Nutrition:  Target Goals: Understanding of nutrition guidelines, daily intake of sodium 1500mg , cholesterol 200mg , calories 30% from fat and 7% or less from saturated fats, daily to have 5 or more servings of fruits and vegetables.  Biometrics:  Pre Biometrics - 11/10/20 1154       Pre Biometrics   Grip Strength 23 kg              Nutrition Therapy Plan and Nutrition Goals:   Nutrition Assessments:  MEDIFICTS Score Key: ?70 Need to make dietary changes  40-70 Heart Healthy Diet ? 40 Therapeutic Level Cholesterol Diet   Picture Your Plate Scores: <73 Unhealthy dietary pattern with much room for improvement. 41-50 Dietary pattern unlikely to meet recommendations for good health and room for improvement. 51-60 More healthful dietary pattern, with some room for improvement.  >60 Healthy dietary pattern, although there may be some specific behaviors that could be improved.    Nutrition Goals Re-Evaluation:   Nutrition Goals Discharge (Final Nutrition Goals Re-Evaluation):   Psychosocial: Target Goals: Acknowledge presence or absence of significant depression and/or stress, maximize coping skills, provide positive support system.  Participant is able to verbalize types and ability to use techniques and skills needed for reducing stress and depression.  Initial Review & Psychosocial Screening:  Initial Psych Review & Screening - 11/10/20 1055       Initial Review   Current issues with Current Sleep Concerns      Family Dynamics   Good Support System? Yes   Son and family     Barriers   Psychosocial barriers to participate in program The patient should benefit from training in stress management and relaxation.      Screening Interventions   Interventions Encouraged to exercise    Expected Outcomes Long Term Goal: Stressors or current issues are controlled or eliminated.;Long Term goal: The participant improves quality of Life and PHQ9 Scores as seen by post scores and/or verbalization of changes             Quality of Life Scores:  Scores of 19 and below usually indicate a poorer quality of life in these areas.  A difference of  2-3 points is a clinically meaningful difference.  A difference of 2-3 points in the total score of the Quality of Life Index has been associated with significant improvement in overall quality of life, self-image, physical symptoms, and general health in studies assessing change in quality of life.  PHQ-9: Recent Review Flowsheet Data     Depression screen [facility] 2/9 11/10/2020 09/03/2020 08/07/2020 06/16/2020 01/25/2019   Decreased Interest 0 0 1 1 0   Down, Depressed, Hopeless 1 0 0 0 0   PHQ - 2 Score 1 0 1 1 0   Altered sleeping 1 1 - 2 -   Tired, decreased energy 2 1 - 2 -   Change in appetite 2 2 - 1 -  Feeling bad or failure about yourself  0 0 - 0 -   Trouble concentrating 0 0 - 0 -   Moving slowly or fidgety/restless 0 0 - 0 -   Suicidal thoughts 0 0 - 0 -   PHQ-9 Score - 4 - 6 -   Difficult doing work/chores Somewhat difficult - - Somewhat difficult -      Interpretation of Total Score  Total Score Depression Severity:  1-4 = Minimal depression, 5-9 = Mild depression,  10-14 = Moderate depression, 15-19 = Moderately severe depression, 20-27 = Severe depression   Psychosocial Evaluation and Intervention:  Psychosocial Evaluation - 11/10/20 1518       Psychosocial Evaluation & Interventions   Interventions Encouraged to exercise with the program and follow exercise prescription    Comments Pt states he does feel down sometimes but does not feel like he is depressed. His PHQ9 score indicates minor depression. He lives alone and does have a son that lives local.    Expected Outcomes For pt to improve quality of life throughout pulmonary rehab shown by a decrease in PHQ9 scores    Continue Psychosocial Services  Follow up required by staff             Psychosocial Re-Evaluation:   Psychosocial Discharge (Final Psychosocial Re-Evaluation):   Education: Education Goals: Education classes will be provided on a weekly basis, covering required topics. Participant will state understanding/return demonstration of topics presented.  Learning Barriers/Preferences:  Learning Barriers/Preferences - 11/10/20 1057       Learning Barriers/Preferences   Learning Barriers Sight   wears reading glasses   Learning Preferences Written Material;Skilled Demonstration;Individual Instruction;Verbal Instruction             Education Topics: Risk Factor Reduction:  -Group instruction that is supported by a PowerPoint presentation. Instructor discusses the definition of a risk factor, different risk factors for pulmonary disease, and how the heart and lungs work together.     Nutrition for Pulmonary Patient:  -Group instruction provided by PowerPoint slides, verbal discussion, and written materials to support subject matter. The instructor gives an explanation and review of healthy diet recommendations, which includes a discussion on weight management, recommendations for fruit and vegetable consumption, as well as protein, fluid, caffeine, fiber, sodium, sugar,  and alcohol. Tips for eating when patients are short of breath are discussed.   Pursed Lip Breathing:  -Group instruction that is supported by demonstration and informational handouts. Instructor discusses the benefits of pursed lip and diaphragmatic breathing and detailed demonstration on how to preform both.     Oxygen Safety:  -Group instruction provided by PowerPoint, verbal discussion, and written material to support subject matter. There is an overview of "What is Oxygen" and "Why do we need it".  Instructor also reviews how to create a safe environment for oxygen use, the importance of using oxygen as prescribed, and the risks of noncompliance. There is a brief discussion on traveling with oxygen and resources the patient may utilize.   Oxygen Equipment:  -Group instruction provided by [facility] Staff utilizing handouts, written materials, and equipment demonstrations.   Signs and Symptoms:  -Group instruction provided by written material and verbal discussion to support subject matter. Warning signs and symptoms of infection, stroke, and heart attack are reviewed and when to call the physician/911 reinforced. Tips for preventing the spread of infection discussed.   Advanced Directives:  -Group instruction provided by verbal instruction and written material to support subject matter. Instructor reviews  Advanced Directive laws and proper instruction for filling out document.   Pulmonary Video:  -Group video education that reviews the importance of medication and oxygen compliance, exercise, good nutrition, pulmonary hygiene, and pursed lip and diaphragmatic breathing for the pulmonary patient.   Exercise for the Pulmonary Patient:  -Group instruction that is supported by a PowerPoint presentation. Instructor discusses benefits of exercise, core components of exercise, frequency, duration, and intensity of an exercise routine, importance of utilizing pulse oximetry during exercise,  safety while exercising, and options of places to exercise outside of rehab.     Pulmonary Medications:  -Verbally interactive group education provided by instructor with focus on inhaled medications and proper administration.   Anatomy and Physiology of the Respiratory System and Intimacy:  -Group instruction provided by PowerPoint, verbal discussion, and written material to support subject matter. Instructor reviews respiratory cycle and anatomical components of the respiratory system and their functions. Instructor also reviews differences in obstructive and restrictive respiratory diseases with examples of each. Intimacy, Sex, and Sexuality differences are reviewed with a discussion on how relationships can change when diagnosed with pulmonary disease. Common sexual concerns are reviewed.   MD DAY -A group question and answer session with a medical doctor that allows participants to ask questions that relate to their pulmonary disease state.   OTHER EDUCATION -Group or individual verbal, written, or video instructions that support the educational goals of the pulmonary rehab program.   Holiday Eating Survival Tips:  -Group instruction provided by PowerPoint slides, verbal discussion, and written materials to support subject matter. The instructor gives patients tips, tricks, and techniques to help them not only survive but enjoy the holidays despite the onslaught of food that accompanies the holidays.   Knowledge Questionnaire Score:  Knowledge Questionnaire Score - 11/10/20 1542       Knowledge Questionnaire Score   Pre Score 13/18             Core Components/Risk Factors/Patient Goals at Admission:  Personal Goals and Risk Factors at Admission - 11/10/20 1521       Core Components/Risk Factors/Patient Goals on Admission    Weight Management Weight Gain    Improve shortness of breath with ADL's Yes    Intervention Provide education, individualized exercise plan and  daily activity instruction to help decrease symptoms of SOB with activities of daily living.    Expected Outcomes Short Term: Improve cardiorespiratory fitness to achieve a reduction of symptoms when performing ADLs;Long Term: Be able to perform more ADLs without symptoms or delay the onset of symptoms    Heart Failure Yes    Intervention Provide a combined exercise and nutrition program that is supplemented with education, support and counseling about heart failure. Directed toward relieving symptoms such as shortness of breath, decreased exercise tolerance, and extremity edema.    Expected Outcomes Improve functional capacity of life;Short term: Daily weights obtained and reported for increase. Utilizing diuretic protocols set by physician.;Short term: Attendance in program 2-3 days a week with increased exercise capacity. Reported lower sodium intake. Reported increased fruit and vegetable intake. Reports medication compliance.;Long term: Adoption of self-care skills and reduction of barriers for early signs and symptoms recognition and intervention leading to self-care maintenance.             Core Components/Risk Factors/Patient Goals Review:    Core Components/Risk Factors/Patient Goals at Discharge (Final Review):    ITP Comments:   Comments:

## 2020-11-11 ENCOUNTER — Other Ambulatory Visit: Payer: Self-pay

## 2020-11-11 DIAGNOSIS — G4733 Obstructive sleep apnea (adult) (pediatric): Secondary | ICD-10-CM

## 2020-11-18 ENCOUNTER — Other Ambulatory Visit: Payer: Self-pay

## 2020-11-18 ENCOUNTER — Encounter (HOSPITAL_COMMUNITY)
Admission: RE | Admit: 2020-11-18 | Discharge: 2020-11-18 | Disposition: A | Discharge status: Home / Self Care | Payer: Medicare HMO | Source: Ambulatory Visit | Attending: Internal Medicine | Admitting: Internal Medicine

## 2020-11-18 DIAGNOSIS — I5032 Chronic diastolic (congestive) heart failure: Secondary | ICD-10-CM

## 2020-11-18 NOTE — Progress Notes (Signed)
Pulmonary Individual Treatment Plan  Patient Details  Name: CHANTRY HEADEN MRN: [id number] Date of Birth: [birth year] Referring Provider:   April Manson Pulmonary Rehab Walk Test from 11/10/2020 in [place]  Referring Provider Dr. Haroldine Laws       Initial Encounter Date:  Flowsheet Row Pulmonary Rehab Walk Test from 11/10/2020 in [place]  Date 11/10/20       Visit Diagnosis: Chronic diastolic heart failure ([place])  Patient's Home Medications on Admission:   Current Outpatient Medications:    amoxicillin (AMOXIL) 500 MG capsule, TAKE 4 CAPSULES BY MOUTH 1 HOUR BEFORE DENTAL APPOINTMENT, Disp: 4 capsule, Rfl: 1   Efinaconazole 10 % SOLN, Apply to toenail daily for 48 weeks, Disp: 8 mL, Rfl: 3   enoxaparin (LOVENOX) 60 MG/0.6ML injection, Inject 0.6 mLs (60 mg total) into the skin daily., Disp: 6 mL, Rfl: 1   ferrous sulfate 325 (65 FE) MG tablet, Take 1 tablet (325 mg total) by mouth every other day. In the evening. (Patient taking differently: Take 325 mg by mouth every other day. In the evening.), Disp: , Rfl: 3   furosemide (LASIX) 40 MG tablet, Take one tablet by mouth every other day, alternating with taking a half tablet (20mg  total) every other day (Patient taking differently: 20 mg. Take one tablet by mouth every other day, alternating with taking a half tablet (20mg  total) every other day), Disp: 68 tablet, Rfl: 3   metolazone (ZAROXOLYN) 2.5 MG tablet, Take as directed by the heart failure clinic (Patient taking differently: Take 2.5 mg by mouth daily as needed (Swelling). Take as directed by the heart failure clinic), Disp: 30 tablet, Rfl: 2   polyethylene glycol (MIRALAX) 17 g packet, Take 17 g by mouth daily as needed. (Patient taking differently: Take 17 g by mouth daily.), Disp: 14 each, Rfl: 0   potassium chloride SA (KLOR-CON) 20 MEQ tablet, Take 1 tablet (20 mEq total) by mouth daily., Disp: 90 tablet, Rfl:  3   sildenafil (REVATIO) 20 MG tablet, Take 20 mg by mouth daily as needed (ED)., Disp: , Rfl:    spironolactone (ALDACTONE) 25 MG tablet, TAKE 1/2 TABLET(12.5 MG) BY MOUTH DAILY, Disp: 15 tablet, Rfl: 3   Testosterone 1.62 % GEL, Apply 2 Pump topically daily. 1 pump on each arm, Disp: , Rfl:    warfarin (COUMADIN) 5 MG tablet, TAKE 1/2 TABLET BY MOUTH ON THURSDAY AND SATURDAY, AND THEN 1 TABLET ON SUNDAY, MONDAY, TUESDAY, WEDNESDAY, AND FRIDAY, Disp: 77 tablet, Rfl: 1  Past Medical History: Past Medical History:  Diagnosis Date   Allergy    Aneurysm of ascending aorta (HCC)    Atrial fibrillation (HCC)    BPH (benign prostatic hyperplasia)    Colon polyps    FHx: rheumatic heart disease    H/O diplopia    Hepatitis C    Hypertension    Hypogonadism male    Mitral valve disease    mitral valve repair/replacement x3    Tobacco Use: Social History   Tobacco Use  Smoking Status Former   Types: Cigars   Quit date: 09/03/2017   Years since quitting: 3.2  Smokeless Tobacco Never    Labs: Recent Review Flowsheet Data     Labs for ITP Cardiac and Pulmonary Rehab Latest Ref Rng & Units 10/23/2019 11/26/2019 05/07/2020 05/07/2020 05/07/2020   PHART 7.350 - 7.450 - - 7.160(LL) 7.272(L) -   PCO2ART 32.0 - 48.0 mmHg - - 68.8(HH)  57.3(H) -   HCO3 20.0 - 28.0 mmol/L 23.6 - 23.5 25.6 23.0   TCO2 22 - 32 mmol/L 25 25 - - -   ACIDBASEDEF 0.0 - 2.0 mmol/L 2.0 - 5.4(H) 1.1 2.8(H)   O2SAT % 54.0 - 44.8 26.9 49.2       Capillary Blood Glucose: Lab Results  Component Value Date   GLUCAP 140 (H) 05/07/2020     Pulmonary Assessment Scores:  Pulmonary Assessment Scores     Row Name 11/10/20 1544         ADL UCSD   ADL Phase Entry     SOB Score total 41           CAT Score   CAT Score 13           mMRC Score   mMRC Score 3             UCSD: Self-administered rating of dyspnea associated with activities of daily living (ADLs) 6-point scale (0 = "not at all" to 5 = "maximal  or unable to do because of breathlessness")  Scoring Scores range from 0 to 120.  Minimally important difference is 5 units  CAT: CAT can identify the health impairment of COPD patients and is better correlated with disease progression.  CAT has a scoring range of zero to 40. The CAT score is classified into four groups of low (less than 10), medium (10 - 20), high (21-30) and very high (31-40) based on the impact level of disease on health status. A CAT score over 10 suggests significant symptoms.  A worsening CAT score could be explained by an exacerbation, poor medication adherence, poor inhaler technique, or progression of COPD or comorbid conditions.  CAT MCID is 2 points  mMRC: mMRC (Modified Medical Research Council) Dyspnea Scale is used to assess the degree of baseline functional disability in patients of respiratory disease due to dyspnea. No minimal important difference is established. A decrease in score of 1 point or greater is considered a positive change.   Pulmonary Function Assessment:  Pulmonary Function Assessment - 11/10/20 1055       Breath   Shortness of Breath Yes;Limiting activity;Fear of Shortness of Breath             Exercise Target Goals: Exercise Program Goal: Individual exercise prescription set using results from initial 6 min walk test and THRR while considering  patient's activity barriers and safety.   Exercise Prescription Goal: Initial exercise prescription builds to 30-45 minutes a day of aerobic activity, 2-3 days per week.  Home exercise guidelines will be given to patient during program as part of exercise prescription that the participant will acknowledge.  Activity Barriers & Risk Stratification:  Activity Barriers & Cardiac Risk Stratification - 11/10/20 1052       Activity Barriers & Cardiac Risk Stratification   Activity Barriers Shortness of Breath;Neck/Spine Problems;Deconditioning             6 Minute Walk:  6 Minute Walk      Row Name 11/10/20 1529         6 Minute Walk   Phase Initial     Distance 1011 feet     Walk Time 6 minutes     # of Rest Breaks 0     MPH 1.91     METS 3.33     RPE 13     Perceived Dyspnea  3     Symptoms Yes (comment)  Comments Pt stopped walk test at 5:30 due to SOB, stated he could not go any further     Resting HR 71 bpm     Resting BP 85/50     Resting Oxygen Saturation  98 %     Exercise Oxygen Saturation  during 6 min walk 94 %     Max Ex. HR 115 bpm     Max Ex. BP 126/64     2 Minute Post BP 120/64           Interval HR   1 Minute HR 87     2 Minute HR 96     3 Minute HR 105     4 Minute HR 110     5 Minute HR 115     6 Minute HR 110     2 Minute Post HR 82     Interval Heart Rate? Yes           Interval Oxygen   Interval Oxygen? Yes     Baseline Oxygen Saturation % 98 %     1 Minute Oxygen Saturation % 98 %     1 Minute Liters of Oxygen 0 L     2 Minute Oxygen Saturation % 96 %     2 Minute Liters of Oxygen 0 L     3 Minute Oxygen Saturation % 95 %     3 Minute Liters of Oxygen 0 L     4 Minute Oxygen Saturation % 94 %     4 Minute Liters of Oxygen 0 L     5 Minute Oxygen Saturation % 94 %     5 Minute Liters of Oxygen 0 L     6 Minute Oxygen Saturation % 95 %     6 Minute Liters of Oxygen 0 L     2 Minute Post Oxygen Saturation % 98 %     2 Minute Post Liters of Oxygen 0 L              Oxygen Initial Assessment:  Oxygen Initial Assessment - 11/10/20 1054       Home Oxygen   Home Oxygen Device None    Sleep Oxygen Prescription None    Home Exercise Oxygen Prescription None    Home Resting Oxygen Prescription None      Initial 6 min Walk   Oxygen Used None      Program Oxygen Prescription   Program Oxygen Prescription None      Intervention   Short Term Goals To learn and understand importance of monitoring SPO2 with pulse oximeter and demonstrate accurate use of the pulse oximeter.;To learn and understand importance of  maintaining oxygen saturations>88%;To learn and demonstrate proper pursed lip breathing techniques or other breathing techniques. ;To learn and demonstrate proper use of respiratory medications    Long  Term Goals Verbalizes importance of monitoring SPO2 with pulse oximeter and return demonstration;Maintenance of O2 saturations>88%;Exhibits proper breathing techniques, such as pursed lip breathing or other method taught during program session;Compliance with respiratory medication;Demonstrates proper use of MDI's             Oxygen Re-Evaluation:   Oxygen Discharge (Final Oxygen Re-Evaluation):   Initial Exercise Prescription:  Initial Exercise Prescription - 11/10/20 1500       Date of Initial Exercise RX and Referring Provider   Date 11/10/20    Referring Provider Dr. Haroldine Laws    Expected Discharge Date 01/15/21  NuStep   Level 2    SPM 80    Minutes 15      Arm Ergometer   Level 1.5    RPM 40    Minutes 15      Prescription Details   Frequency (times per week) 2    Duration Progress to 30 minutes of continuous aerobic without signs/symptoms of physical distress      Intensity   THRR 40-80% of Max Heartrate 61-122    Ratings of Perceived Exertion 11-13    Perceived Dyspnea 0-4      Progression   Progression Continue to progress workloads to maintain intensity without signs/symptoms of physical distress.      Resistance Training   Training Prescription Yes    Weight red bands    Reps 10-15             Perform Capillary Blood Glucose checks as needed.  Exercise Prescription Changes:   Exercise Comments:   Exercise Goals and Review:   Exercise Goals     Row Name 11/10/20 1155 11/18/20 0853           Exercise Goals   Increase Physical Activity Yes Yes      Intervention Provide advice, education, support and counseling about physical activity/exercise needs.;Develop an individualized exercise prescription for aerobic and resistive training  based on initial evaluation findings, risk stratification, comorbidities and participant's personal goals. Provide advice, education, support and counseling about physical activity/exercise needs.;Develop an individualized exercise prescription for aerobic and resistive training based on initial evaluation findings, risk stratification, comorbidities and participant's personal goals.      Expected Outcomes Short Term: Attend rehab on a regular basis to increase amount of physical activity.;Long Term: Add in home exercise to make exercise part of routine and to increase amount of physical activity.;Long Term: Exercising regularly at least 3-5 days a week. Short Term: Attend rehab on a regular basis to increase amount of physical activity.;Long Term: Add in home exercise to make exercise part of routine and to increase amount of physical activity.;Long Term: Exercising regularly at least 3-5 days a week.      Increase Strength and Stamina Yes Yes      Intervention Provide advice, education, support and counseling about physical activity/exercise needs.;Develop an individualized exercise prescription for aerobic and resistive training based on initial evaluation findings, risk stratification, comorbidities and participant's personal goals. Provide advice, education, support and counseling about physical activity/exercise needs.;Develop an individualized exercise prescription for aerobic and resistive training based on initial evaluation findings, risk stratification, comorbidities and participant's personal goals.      Expected Outcomes Short Term: Increase workloads from initial exercise prescription for resistance, speed, and METs.;Short Term: Perform resistance training exercises routinely during rehab and add in resistance training at home;Long Term: Improve cardiorespiratory fitness, muscular endurance and strength as measured by increased METs and functional capacity (6MWT) Short Term: Increase workloads from  initial exercise prescription for resistance, speed, and METs.;Short Term: Perform resistance training exercises routinely during rehab and add in resistance training at home;Long Term: Improve cardiorespiratory fitness, muscular endurance and strength as measured by increased METs and functional capacity (6MWT)      Able to understand and use rate of perceived exertion (RPE) scale Yes Yes      Intervention Provide education and explanation on how to use RPE scale Provide education and explanation on how to use RPE scale      Expected Outcomes Short Term: Able to use RPE daily in rehab to  express subjective intensity level;Long Term:  Able to use RPE to guide intensity level when exercising independently Short Term: Able to use RPE daily in rehab to express subjective intensity level;Long Term:  Able to use RPE to guide intensity level when exercising independently      Able to understand and use Dyspnea scale Yes Yes      Intervention Provide education and explanation on how to use Dyspnea scale Provide education and explanation on how to use Dyspnea scale      Expected Outcomes Short Term: Able to use Dyspnea scale daily in rehab to express subjective sense of shortness of breath during exertion;Long Term: Able to use Dyspnea scale to guide intensity level when exercising independently Short Term: Able to use Dyspnea scale daily in rehab to express subjective sense of shortness of breath during exertion;Long Term: Able to use Dyspnea scale to guide intensity level when exercising independently      Knowledge and understanding of Target Heart Rate Range (THRR) Yes Yes      Intervention Provide education and explanation of THRR including how the numbers were predicted and where they are located for reference Provide education and explanation of THRR including how the numbers were predicted and where they are located for reference      Expected Outcomes Short Term: Able to state/look up THRR;Long Term: Able  to use THRR to govern intensity when exercising independently;Short Term: Able to use daily as guideline for intensity in rehab Short Term: Able to state/look up THRR;Short Term: Able to use daily as guideline for intensity in rehab;Long Term: Able to use THRR to govern intensity when exercising independently      Understanding of Exercise Prescription Yes Yes      Intervention Provide education, explanation, and written materials on patient's individual exercise prescription Provide education, explanation, and written materials on patient's individual exercise prescription      Expected Outcomes Short Term: Able to explain program exercise prescription;Long Term: Able to explain home exercise prescription to exercise independently Short Term: Able to explain program exercise prescription;Long Term: Able to explain home exercise prescription to exercise independently               Exercise Goals Re-Evaluation :  Exercise Goals Re-Evaluation     Row Name 11/18/20 0854             Exercise Goal Re-Evaluation   Exercise Goals Review Increase Physical Activity;Increase Strength and Stamina;Able to understand and use rate of perceived exertion (RPE) scale;Able to understand and use Dyspnea scale;Knowledge and understanding of Target Heart Rate Range (THRR);Understanding of Exercise Prescription       Comments Pt is scheduled to begin exercise this week. Will continue to monitor and make adjustments as necessary.       Expected Outcomes Through exercise at rehab and home, the patient will decrease shortness of breath with daily activities and feel confident in carrying out an exercise regimn at home.                Discharge Exercise Prescription (Final Exercise Prescription Changes):   Nutrition:  Target Goals: Understanding of nutrition guidelines, daily intake of sodium 1500mg , cholesterol 200mg , calories 30% from fat and 7% or less from saturated fats, daily to have 5 or more  servings of fruits and vegetables.  Biometrics:  Pre Biometrics - 11/10/20 1154       Pre Biometrics   Grip Strength 23 kg  Nutrition Therapy Plan and Nutrition Goals:   Nutrition Assessments:  MEDIFICTS Score Key: ?70 Need to make dietary changes  40-70 Heart Healthy Diet ? 40 Therapeutic Level Cholesterol Diet   Picture Your Plate Scores: <93 Unhealthy dietary pattern with much room for improvement. 41-50 Dietary pattern unlikely to meet recommendations for good health and room for improvement. 51-60 More healthful dietary pattern, with some room for improvement.  >60 Healthy dietary pattern, although there may be some specific behaviors that could be improved.    Nutrition Goals Re-Evaluation:   Nutrition Goals Discharge (Final Nutrition Goals Re-Evaluation):   Psychosocial: Target Goals: Acknowledge presence or absence of significant depression and/or stress, maximize coping skills, provide positive support system. Participant is able to verbalize types and ability to use techniques and skills needed for reducing stress and depression.  Initial Review & Psychosocial Screening:  Initial Psych Review & Screening - 11/10/20 1055       Initial Review   Current issues with Current Sleep Concerns      Family Dynamics   Good Support System? Yes   Son and family     Barriers   Psychosocial barriers to participate in program The patient should benefit from training in stress management and relaxation.      Screening Interventions   Interventions Encouraged to exercise    Expected Outcomes Long Term Goal: Stressors or current issues are controlled or eliminated.;Long Term goal: The participant improves quality of Life and PHQ9 Scores as seen by post scores and/or verbalization of changes             Quality of Life Scores:  Scores of 19 and below usually indicate a poorer quality of life in these areas.  A difference of  2-3 points is a  clinically meaningful difference.  A difference of 2-3 points in the total score of the Quality of Life Index has been associated with significant improvement in overall quality of life, self-image, physical symptoms, and general health in studies assessing change in quality of life.  PHQ-9: Recent Review Flowsheet Data     Depression screen [facility] 2/9 11/10/2020 09/03/2020 08/07/2020 06/16/2020 01/25/2019   Decreased Interest 0 0 1 1 0   Down, Depressed, Hopeless 1 0 0 0 0   PHQ - 2 Score 1 0 1 1 0   Altered sleeping 1 1 - 2 -   Tired, decreased energy 2 1 - 2 -   Change in appetite 2 2 - 1 -   Feeling bad or failure about yourself  0 0 - 0 -   Trouble concentrating 0 0 - 0 -   Moving slowly or fidgety/restless 0 0 - 0 -   Suicidal thoughts 0 0 - 0 -   PHQ-9 Score - 4 - 6 -   Difficult doing work/chores Somewhat difficult - - Somewhat difficult -      Interpretation of Total Score  Total Score Depression Severity:  1-4 = Minimal depression, 5-9 = Mild depression, 10-14 = Moderate depression, 15-19 = Moderately severe depression, 20-27 = Severe depression   Psychosocial Evaluation and Intervention:  Psychosocial Evaluation - 11/10/20 1518       Psychosocial Evaluation & Interventions   Interventions Encouraged to exercise with the program and follow exercise prescription    Comments Pt states he does feel down sometimes but does not feel like he is depressed. His PHQ9 score indicates minor depression. He lives alone and does have a son that lives local.  Expected Outcomes For pt to improve quality of life throughout pulmonary rehab shown by a decrease in PHQ9 scores    Continue Psychosocial Services  Follow up required by staff             Psychosocial Re-Evaluation:  Psychosocial Re-Evaluation     [place] Name 11/17/20 1134             Psychosocial Re-Evaluation   Current issues with Current Sleep Concerns       Comments No change in psychosocial concerns since  orientation/walk test in pulmonary rehab.  He will begin exercising 11/18/2020 in pulmonary rehab, will continue to evaluate concerns on a monthly basis.                Psychosocial Discharge (Final Psychosocial Re-Evaluation):  Psychosocial Re-Evaluation - 11/17/20 1134       Psychosocial Re-Evaluation   Current issues with Current Sleep Concerns    Comments No change in psychosocial concerns since orientation/walk test in pulmonary rehab.  He will begin exercising 11/18/2020 in pulmonary rehab, will continue to evaluate concerns on a monthly basis.             Education: Education Goals: Education classes will be provided on a weekly basis, covering required topics. Participant will state understanding/return demonstration of topics presented.  Learning Barriers/Preferences:  Learning Barriers/Preferences - 11/10/20 1057       Learning Barriers/Preferences   Learning Barriers Sight   wears reading glasses   Learning Preferences Written Material;Skilled Demonstration;Individual Instruction;Verbal Instruction             Education Topics: Risk Factor Reduction:  -Group instruction that is supported by a PowerPoint presentation. Instructor discusses the definition of a risk factor, different risk factors for pulmonary disease, and how the heart and lungs work together.     Nutrition for Pulmonary Patient:  -Group instruction provided by PowerPoint slides, verbal discussion, and written materials to support subject matter. The instructor gives an explanation and review of healthy diet recommendations, which includes a discussion on weight management, recommendations for fruit and vegetable consumption, as well as protein, fluid, caffeine, fiber, sodium, sugar, and alcohol. Tips for eating when patients are short of breath are discussed.   Pursed Lip Breathing:  -Group instruction that is supported by demonstration and informational handouts. Instructor discusses the  benefits of pursed lip and diaphragmatic breathing and detailed demonstration on how to preform both.     Oxygen Safety:  -Group instruction provided by PowerPoint, verbal discussion, and written material to support subject matter. There is an overview of "What is Oxygen" and "Why do we need it".  Instructor also reviews how to create a safe environment for oxygen use, the importance of using oxygen as prescribed, and the risks of noncompliance. There is a brief discussion on traveling with oxygen and resources the patient may utilize.   Oxygen Equipment:  -Group instruction provided by [facility] Staff utilizing handouts, written materials, and equipment demonstrations.   Signs and Symptoms:  -Group instruction provided by written material and verbal discussion to support subject matter. Warning signs and symptoms of infection, stroke, and heart attack are reviewed and when to call the physician/911 reinforced. Tips for preventing the spread of infection discussed.   Advanced Directives:  -Group instruction provided by verbal instruction and written material to support subject matter. Instructor reviews Advanced Directive laws and proper instruction for filling out document.   Pulmonary Video:  -Group video education that reviews the importance of medication and  oxygen compliance, exercise, good nutrition, pulmonary hygiene, and pursed lip and diaphragmatic breathing for the pulmonary patient.   Exercise for the Pulmonary Patient:  -Group instruction that is supported by a PowerPoint presentation. Instructor discusses benefits of exercise, core components of exercise, frequency, duration, and intensity of an exercise routine, importance of utilizing pulse oximetry during exercise, safety while exercising, and options of places to exercise outside of rehab.     Pulmonary Medications:  -Verbally interactive group education provided by instructor with focus on inhaled medications and  proper administration.   Anatomy and Physiology of the Respiratory System and Intimacy:  -Group instruction provided by PowerPoint, verbal discussion, and written material to support subject matter. Instructor reviews respiratory cycle and anatomical components of the respiratory system and their functions. Instructor also reviews differences in obstructive and restrictive respiratory diseases with examples of each. Intimacy, Sex, and Sexuality differences are reviewed with a discussion on how relationships can change when diagnosed with pulmonary disease. Common sexual concerns are reviewed.   MD DAY -A group question and answer session with a medical doctor that allows participants to ask questions that relate to their pulmonary disease state.   OTHER EDUCATION -Group or individual verbal, written, or video instructions that support the educational goals of the pulmonary rehab program.   Holiday Eating Survival Tips:  -Group instruction provided by PowerPoint slides, verbal discussion, and written materials to support subject matter. The instructor gives patients tips, tricks, and techniques to help them not only survive but enjoy the holidays despite the onslaught of food that accompanies the holidays.   Knowledge Questionnaire Score:  Knowledge Questionnaire Score - 11/10/20 1542       Knowledge Questionnaire Score   Pre Score 13/18             Core Components/Risk Factors/Patient Goals at Admission:  Personal Goals and Risk Factors at Admission - 11/17/20 1149       Core Components/Risk Factors/Patient Goals on Admission   Heart Failure Yes    Intervention Provide a combined exercise and nutrition program that is supplemented with education, support and counseling about heart failure. Directed toward relieving symptoms such as shortness of breath, decreased exercise tolerance, and extremity edema.    Expected Outcomes Improve functional capacity of life;Short term:  Attendance in program 2-3 days a week with increased exercise capacity. Reported lower sodium intake. Reported increased fruit and vegetable intake. Reports medication compliance.;Short term: Daily weights obtained and reported for increase. Utilizing diuretic protocols set by physician.;Long term: Adoption of self-care skills and reduction of barriers for early signs and symptoms recognition and intervention leading to self-care maintenance.             Core Components/Risk Factors/Patient Goals Review:   Goals and Risk Factor Review     Row Name 11/17/20 1150             Core Components/Risk Factors/Patient Goals Review   Personal Goals Review Heart Failure       Review [place] will begin exercising on 11/18/2020, which is tomorrow, will hopefully see progression towards goals in the next 30 days.       Expected Outcomes See admission goals.                Core Components/Risk Factors/Patient Goals at Discharge (Final Review):   Goals and Risk Factor Review - 11/17/20 1150       Core Components/Risk Factors/Patient Goals Review   Personal Goals Review Heart Failure    Review Jenny Reichmann  will begin exercising on 11/18/2020, which is tomorrow, will hopefully see progression towards goals in the next 30 days.    Expected Outcomes See admission goals.             ITP Comments:   Comments: ITP REVIEW Pt is making expected progress toward pulmonary rehab goals after completing 0 sessions. Recommend continued exercise, life style modification, education, and utilization of breathing techniques to increase stamina and strength and decrease shortness of breath with exertion.

## 2020-11-18 NOTE — Progress Notes (Signed)
Daily Session Note  Patient Details  Name: [name] MRN: [id number] Date of Birth: 1950/08/15 Referring Provider:   April Manson Pulmonary Rehab Walk Test from 11/10/2020 in [place]  Referring Provider Dr. Haroldine Laws       Encounter Date: 11/18/2020  Check In:  Session Check In - 11/18/20 1117       Check-In   Supervising physician immediately available to respond to emergencies [facility] immediately available    Physician(s) Dr. Alfredia Ferguson    Location [facility]    Staff Present Rosebud Poles, RN, Quentin Ore, MS, ACSM-CEP, Exercise Physiologist;Lisa Ysidro Evert, RN    Virtual Visit No    Medication changes reported     No    Fall or balance concerns reported    No    Tobacco Cessation No Change    Warm-up and Cool-down Performed as group-led instruction    Resistance Training Performed Yes    VAD Patient? No    PAD/SET Patient? No      Pain Assessment   Currently in Pain? No/denies    Multiple Pain Sites No             Capillary Blood Glucose: No results found for this or any previous visit (from the past 24 hour(s)).    Social History   Tobacco Use  Smoking Status Former   Types: Cigars   Quit date: 09/03/2017   Years since quitting: 3.2  Smokeless Tobacco Never    Goals Met:  Proper associated with RPD/PD & O2 Sat Exercise tolerated well No report of cardiac concerns or symptoms Strength training completed today  Goals Unmet:  Not Applicable  Comments: Service time is from 1015  to 53    Dr. Fransico Him is Medical Director for Cardiac Rehab at [facility].

## 2020-11-18 NOTE — Telephone Encounter (Signed)
Patient is scheduled for lab study on 12/15/20. Patient understands his sleep study will be done at [facility] sleep lab. Patient understands he will receive a sleep packet in a week or so. Patient understands to call if he does not receive the sleep packet in a timely manner.  Left detailed message on voicemail with date and time of titration and informed patient to call back to confirm or reschedule.

## 2020-11-20 ENCOUNTER — Other Ambulatory Visit: Payer: Self-pay

## 2020-11-20 ENCOUNTER — Encounter (HOSPITAL_COMMUNITY)
Admission: RE | Admit: 2020-11-20 | Discharge: 2020-11-20 | Disposition: A | Discharge status: Home / Self Care | Payer: Medicare HMO | Source: Ambulatory Visit | Attending: Internal Medicine | Admitting: Internal Medicine

## 2020-11-20 VITALS — Wt 127.0 lb

## 2020-11-20 DIAGNOSIS — I5032 Chronic diastolic (congestive) heart failure: Secondary | ICD-10-CM | POA: Diagnosis not present

## 2020-11-20 NOTE — Progress Notes (Signed)
Daily Session Note  Patient Details  Name: [name] MRN: [id number] Date of Birth: 11-Nov-1950 Referring Provider:   April Manson Pulmonary Rehab Walk Test from 11/10/2020 in [place]  Referring Provider Dr. Haroldine Laws       Encounter Date: 11/20/2020  Check In:  Session Check In - 11/20/20 1111       Check-In   Physician(s) Dr. Mal Misty    [facility]    Staff Present Trish Fountain, RN, BSN;Carlette Wilber Oliphant, RN, Quentin Ore, MS, ACSM-CEP, Exercise Physiologist;David Lilyan Punt, MS, ACSM-CEP, CCRP, Exercise Physiologist    Virtual Visit No    Medication changes reported     No    Fall or balance concerns reported    No    Tobacco Cessation No Change    Warm-up and Cool-down Performed as group-led instruction    Resistance Training Performed Yes    VAD Patient? No    PAD/SET Patient? No      Pain Assessment   Currently in Pain? No/denies    Multiple Pain Sites No             Capillary Blood Glucose: No results found for this or any previous visit (from the past 24 hour(s)).    Social History   Tobacco Use  Smoking Status Former   Types: Cigars   Quit date: 09/03/2017   Years since quitting: 3.2  Smokeless Tobacco Never    Goals Met:  Proper associated with RPD/PD & O2 Sat Exercise tolerated well No report of cardiac concerns or symptoms Strength training completed today  Goals Unmet:  Not Applicable  Comments: Service time is from 1015 to [place]   Dr. Fransico Him is Medical Director for Cardiac Rehab at [facility].

## 2020-11-21 ENCOUNTER — Telehealth: Payer: Self-pay | Admitting: *Deleted

## 2020-11-21 DIAGNOSIS — G4733 Obstructive sleep apnea (adult) (pediatric): Secondary | ICD-10-CM

## 2020-11-21 NOTE — Telephone Encounter (Signed)
-----   Message from Sueanne Margarita, MD sent at 11/04/2020  4:51 PM EDT ----- Please let patient know that they have sleep apnea.  Recommend therapeutic CPAP titration for treatment of patient's sleep disordered breathing.  If unable to perform an in lab titration then initiate ResMed auto CPAP from 4 to 15cm H2O with heated humidity and mask of choice and overnight pulse ox on CPAP.

## 2020-11-25 ENCOUNTER — Encounter (HOSPITAL_COMMUNITY)
Admission: RE | Admit: 2020-11-25 | Discharge: 2020-11-25 | Disposition: A | Discharge status: Home / Self Care | Payer: Medicare HMO | Source: Ambulatory Visit | Attending: Internal Medicine | Admitting: Internal Medicine

## 2020-11-25 ENCOUNTER — Other Ambulatory Visit (HOSPITAL_COMMUNITY): Payer: Self-pay | Admitting: Internal Medicine

## 2020-11-25 ENCOUNTER — Other Ambulatory Visit: Payer: Self-pay

## 2020-11-25 VITALS — Wt 125.9 lb

## 2020-11-25 DIAGNOSIS — I5032 Chronic diastolic (congestive) heart failure: Secondary | ICD-10-CM

## 2020-11-25 NOTE — Progress Notes (Signed)
Daily Session Note  Patient Details  Name: [name] MRN: [id number] Date of Birth: 03/16/1951 Referring Provider:   Flowsheet Row Pulmonary Rehab Walk Test from 11/10/2020 in [facility]  Referring Provider Dr. Bensimhon       Encounter Date: 11/25/2020  Check In:  Session Check In - 11/25/20 1154       Check-In   Supervising physician immediately available to respond to emergencies [facility] immediately available    Physician(s) Dr. Ayiku    [facility]    Staff Present Jessica Martin, MS, ACSM-CEP, Exercise Physiologist;Kaylee Davis, MS, ACSM-CEP, Exercise Physiologist;Lisa Hughes, RN    Virtual Visit No    Medication changes reported     No    Fall or balance concerns reported    No    Tobacco Cessation No Change    Warm-up and Cool-down Performed as group-led instruction    Resistance Training Performed Yes    VAD Patient? No    PAD/SET Patient? No      Pain Assessment   Currently in Pain? No/denies    Multiple Pain Sites No             Capillary Blood Glucose: No results found for this or any previous visit (from the past 24 hour(s)).   Exercise Prescription Changes - 11/25/20 1200       Response to Exercise   Blood Pressure (Admit) 104/50    Blood Pressure (Exercise) 110/58    Blood Pressure (Exit) 98/60    Heart Rate (Admit) 80 bpm    Heart Rate (Exercise) 78 bpm    Heart Rate (Exit) 76 bpm    Oxygen Saturation (Admit) 100 %    Oxygen Saturation (Exercise) 98 %    Oxygen Saturation (Exit) 100 %    Rating of Perceived Exertion (Exercise) 12    Perceived Dyspnea (Exercise) 1    Duration Progress to 30 minutes of  aerobic without signs/symptoms of physical distress    Intensity Other (comment)   40-80% of HRR     Progression   Progression Continue to progress workloads to maintain intensity without signs/symptoms of physical distress.      Resistance Training   Training  Prescription Yes    Weight Red Bands    Reps 10-15    Time 10 Minutes      NuStep   Level 2    SPM 80    Minutes 15      Arm Ergometer   Level 1    RPM 23    Minutes 15             Social History   Tobacco Use  Smoking Status Former   Types: Cigars   Quit date: 09/03/2017   Years since quitting: 3.2  Smokeless Tobacco Never    Goals Met:  Exercise tolerated well No report of cardiac concerns or symptoms Strength training completed today  Goals Unmet:  Not Applicable  Comments: Service time is from 1016 to 1139    Dr. Traci Turner is Medical Director for Cardiac Rehab at [facility]. 

## 2020-11-27 ENCOUNTER — Other Ambulatory Visit: Payer: Self-pay

## 2020-11-27 ENCOUNTER — Encounter (HOSPITAL_COMMUNITY)
Admission: RE | Admit: 2020-11-27 | Discharge: 2020-11-27 | Disposition: A | Discharge status: Home / Self Care | Payer: Medicare HMO | Source: Ambulatory Visit | Attending: Internal Medicine | Admitting: Internal Medicine

## 2020-11-27 ENCOUNTER — Telehealth: Payer: Self-pay | Admitting: *Deleted

## 2020-11-27 DIAGNOSIS — I5032 Chronic diastolic (congestive) heart failure: Secondary | ICD-10-CM | POA: Diagnosis not present

## 2020-11-27 DIAGNOSIS — G4733 Obstructive sleep apnea (adult) (pediatric): Secondary | ICD-10-CM

## 2020-11-27 NOTE — Telephone Encounter (Signed)
-----   Message from Sueanne Margarita, MD sent at 11/04/2020  4:51 PM EDT ----- Please let patient know that they have sleep apnea.  Recommend therapeutic CPAP titration for treatment of patient's sleep disordered breathing.  If unable to perform an in lab titration then initiate ResMed auto CPAP from 4 to 15cm H2O with heated humidity and mask of choice and overnight pulse ox on CPAP.

## 2020-11-27 NOTE — Telephone Encounter (Signed)
Patient understands his sleep study showed they have sleep apnea and recommend CPAP titration. Please set up titration in the sleep lab ASAP. Pt is aware of her/his results  Left message  Titration to be ordered

## 2020-11-27 NOTE — Telephone Encounter (Signed)
This encounter was created in error - please disregard.

## 2020-11-27 NOTE — Progress Notes (Signed)
Daily Session Note  Patient Details  Name: [name] MRN: [id number] Date of Birth: 1950/10/29 Referring Provider:   April Manson Pulmonary Rehab Walk Test from 11/10/2020 in [place]  Referring Provider Dr. Haroldine Laws       Encounter Date: 11/27/2020  Check In:  Session Check In - 11/27/20 1142       Check-In   Supervising physician immediately available to respond to emergencies [facility] immediately available    Physician(s) Dr. Sloan Leiter    [facility]    Staff Present Lesly Rubenstein, MS, ACSM-CEP, CCRP, Exercise Physiologist;Lindzy Rupert Clarisa Schools, MS, ACSM-CEP, Exercise Physiologist;Jessica Hassell Done, MS, ACSM-CEP, Exercise Physiologist    Virtual Visit No    Medication changes reported     No    Fall or balance concerns reported    No    Tobacco Cessation No Change    Warm-up and Cool-down Performed as group-led instruction    Resistance Training Performed Yes    VAD Patient? No    PAD/SET Patient? No      Pain Assessment   Currently in Pain? No/denies    Multiple Pain Sites No             Capillary Blood Glucose: No results found for this or any previous visit (from the past 24 hour(s)).    Social History   Tobacco Use  Smoking Status Former   Types: Cigars   Quit date: 09/03/2017   Years since quitting: 3.2  Smokeless Tobacco Never    Goals Met:  No report of cardiac concerns or symptoms Strength training completed today  Goals Unmet:  Not Applicable  Comments: Service time is from 1115 to 1127 Pt c/o of feeling more short of breathe today.    Dr. Fransico Him is Medical Director for Cardiac Rehab at [facility].

## 2020-11-27 NOTE — Addendum Note (Signed)
Addended by: Freada Bergeron on: 11/27/2020 12:58 PM   Modules accepted: Orders, Level of Service, SmartSet

## 2020-11-27 NOTE — Telephone Encounter (Signed)
Prior Authorization for TITRATION sent to [facility] via web portal. Case Number: [id number] Status: Case sent to medical review due to same or similar procedure on file.  [name]

## 2020-11-27 NOTE — Progress Notes (Addendum)
[name] 70 y.o. male Nutrition Note  Diagnosis: CHF  Past Medical History:  Diagnosis Date   Allergy    Aneurysm of ascending aorta (HCC)    Atrial fibrillation (HCC)    BPH (benign prostatic hyperplasia)    Colon polyps    FHx: rheumatic heart disease    H/O diplopia    Hepatitis C    Hypertension    Hypogonadism male    Mitral valve disease    mitral valve repair/replacement x3     Medications reviewed.   Current Outpatient Medications:    amoxicillin (AMOXIL) 500 MG capsule, TAKE 4 CAPSULES BY MOUTH 1 HOUR BEFORE DENTAL APPOINTMENT, Disp: 4 capsule, Rfl: 1   Efinaconazole 10 % SOLN, Apply to toenail daily for 48 weeks, Disp: 8 mL, Rfl: 3   enoxaparin (LOVENOX) 60 MG/0.6ML injection, Inject 0.6 mLs (60 mg total) into the skin daily., Disp: 6 mL, Rfl: 1   ferrous sulfate 325 (65 FE) MG tablet, Take 1 tablet (325 mg total) by mouth every other day. In the evening. (Patient taking differently: Take 325 mg by mouth every other day. In the evening.), Disp: , Rfl: 3   furosemide (LASIX) 40 MG tablet, Take one tablet by mouth every other day, alternating with taking a half tablet (20mg  total) every other day (Patient taking differently: 20 mg. Take one tablet by mouth every other day, alternating with taking a half tablet (20mg  total) every other day), Disp: 68 tablet, Rfl: 3   metolazone (ZAROXOLYN) 2.5 MG tablet, TAKE AS DIRECTED BY THE HEART FAILURE CLINIC, Disp: 30 tablet, Rfl: 2   polyethylene glycol (MIRALAX) 17 g packet, Take 17 g by mouth daily as needed. (Patient taking differently: Take 17 g by mouth daily.), Disp: 14 each, Rfl: 0   potassium chloride SA (KLOR-CON) 20 MEQ tablet, Take 1 tablet (20 mEq total) by mouth daily., Disp: 90 tablet, Rfl: 3   sildenafil (REVATIO) 20 MG tablet, Take 20 mg by mouth daily as needed (ED)., Disp: , Rfl:    spironolactone (ALDACTONE) 25 MG tablet, TAKE 1/2 TABLET(12.5 MG) BY MOUTH DAILY, Disp: 15 tablet, Rfl: 3   Testosterone 1.62 %  GEL, Apply 2 Pump topically daily. 1 pump on each arm, Disp: , Rfl:    warfarin (COUMADIN) 5 MG tablet, TAKE 1/2 TABLET BY MOUTH ON THURSDAY AND SATURDAY, AND THEN 1 TABLET ON SUNDAY, MONDAY, TUESDAY, WEDNESDAY, AND FRIDAY, Disp: 77 tablet, Rfl: 1   Ht Readings from Last 1 Encounters:  11/10/20 5' 11.25" (1.81 m)     Wt Readings from Last 3 Encounters:  11/25/20 125 lb 14.1 oz (57.1 kg)  11/25/20 127 lb (57.6 kg)  11/10/20 127 lb 3.3 oz (57.7 kg)     There is no height or weight on file to calculate BMI.   Social History   Tobacco Use  Smoking Status Former   Types: Cigars   Quit date: 09/03/2017   Years since quitting: 3.2  Smokeless Tobacco Never      Nutrition Note  Spoke with pt. Nutrition Plan and Nutrition Survey goals reviewed with pt.    Pt reports poor appetite and unintentional weight loss of 20 lbs (15% of bodyweight) over the last 12 months. Pt is concerned about his weight. His BMI is 17.43 kg/m2. He wants to gain weight. He is currently eating 3 meals and 1 snack daily. We discussed adding higher calorie foods by adding fats to meals and snacks.  Meals per day: 3-4 (including  snacks)  Pt with dx of CHF. Per discussion, pt limits sodium to <2000 mg/day. Pt does not use canned/convenience foods often. Pt does not add salt to food. Pt does not eat out frequently.   Diet recall: Breakfast: honey nut cheerios with fruit and whole milk, orange juice Lunch: [place] and swiss on whole wheat bread with lettuce, tomato, and mayo Dinner: Salmon OR chicken, sweet potato OR white potato with butter/sour cream, broccoli OR green beans Snacks: breyers ice cream or milkshake  Drinks: lemonade or juice   Pt expressed understanding of the information reviewed.    Nutrition Diagnosis  Underweight related to poor appetite and early satiety as evidenced by a BMI 17.43 kg/m2 and weight loss 13.7% in past 12 months  Nutrition Intervention Pt's individual nutrition plan  reviewed with pt. Benefits of adopting healthy diet reviewed with Picture My Plate survey   Pt given handouts for: ? High Protein, high cal nutrition therapy  Continue client-centered nutrition education by RD, as part of interdisciplinary care.  Goal(s) Pt to identify food quantities necessary to achieve weight gain of 0.5-1 lb per week at graduation from pulmonary rehab.  Pt to build a healthy plate including vegetables, fruits, whole grains, and low-fat dairy products in a heart healthy meal plan. Pt to add healthy fats to all meals and snacks to incorporate more calories.   Plan:  Will provide client-centered nutrition education as part of interdisciplinary care Monitor and evaluate progress toward nutrition goal with team.   Michaele Offer, MS, RDN, LDN, CDCES

## 2020-11-30 ENCOUNTER — Other Ambulatory Visit (HOSPITAL_COMMUNITY): Payer: Self-pay | Admitting: Internal Medicine

## 2020-12-01 ENCOUNTER — Other Ambulatory Visit: Payer: Self-pay

## 2020-12-01 ENCOUNTER — Ambulatory Visit (HOSPITAL_COMMUNITY)
Admission: RE | Admit: 2020-12-01 | Discharge: 2020-12-01 | Disposition: A | Discharge status: Home / Self Care | Payer: Medicare HMO | Source: Ambulatory Visit | Attending: Internal Medicine | Admitting: Internal Medicine

## 2020-12-01 ENCOUNTER — Encounter (HOSPITAL_COMMUNITY): Payer: Self-pay | Admitting: Internal Medicine

## 2020-12-01 VITALS — BP 118/67 | HR 73 | Wt 126.0 lb

## 2020-12-01 DIAGNOSIS — I4892 Unspecified atrial flutter: Secondary | ICD-10-CM | POA: Insufficient documentation

## 2020-12-01 DIAGNOSIS — I5042 Chronic combined systolic (congestive) and diastolic (congestive) heart failure: Secondary | ICD-10-CM | POA: Insufficient documentation

## 2020-12-01 DIAGNOSIS — G4733 Obstructive sleep apnea (adult) (pediatric): Secondary | ICD-10-CM | POA: Diagnosis not present

## 2020-12-01 DIAGNOSIS — Z952 Presence of prosthetic heart valve: Secondary | ICD-10-CM | POA: Insufficient documentation

## 2020-12-01 DIAGNOSIS — Z7901 Long term (current) use of anticoagulants: Secondary | ICD-10-CM | POA: Insufficient documentation

## 2020-12-01 DIAGNOSIS — Z86010 Personal history of colonic polyps: Secondary | ICD-10-CM | POA: Insufficient documentation

## 2020-12-01 DIAGNOSIS — I130 Hypertensive heart and chronic kidney disease with heart failure and stage 1 through stage 4 chronic kidney disease, or unspecified chronic kidney disease: Secondary | ICD-10-CM | POA: Diagnosis not present

## 2020-12-01 DIAGNOSIS — N184 Chronic kidney disease, stage 4 (severe): Secondary | ICD-10-CM | POA: Diagnosis not present

## 2020-12-01 DIAGNOSIS — I712 Thoracic aortic aneurysm, without rupture: Secondary | ICD-10-CM | POA: Diagnosis not present

## 2020-12-01 DIAGNOSIS — Z8719 Personal history of other diseases of the digestive system: Secondary | ICD-10-CM | POA: Insufficient documentation

## 2020-12-01 DIAGNOSIS — I059 Rheumatic mitral valve disease, unspecified: Secondary | ICD-10-CM | POA: Insufficient documentation

## 2020-12-01 DIAGNOSIS — Z79890 Hormone replacement therapy (postmenopausal): Secondary | ICD-10-CM | POA: Diagnosis not present

## 2020-12-01 DIAGNOSIS — B1920 Unspecified viral hepatitis C without hepatic coma: Secondary | ICD-10-CM | POA: Diagnosis not present

## 2020-12-01 DIAGNOSIS — Z79899 Other long term (current) drug therapy: Secondary | ICD-10-CM | POA: Insufficient documentation

## 2020-12-01 DIAGNOSIS — D649 Anemia, unspecified: Secondary | ICD-10-CM | POA: Insufficient documentation

## 2020-12-01 DIAGNOSIS — I272 Pulmonary hypertension, unspecified: Secondary | ICD-10-CM | POA: Insufficient documentation

## 2020-12-01 DIAGNOSIS — Z87891 Personal history of nicotine dependence: Secondary | ICD-10-CM | POA: Insufficient documentation

## 2020-12-01 DIAGNOSIS — R69 Illness, unspecified: Secondary | ICD-10-CM | POA: Diagnosis not present

## 2020-12-01 DIAGNOSIS — Z8249 Family history of ischemic heart disease and other diseases of the circulatory system: Secondary | ICD-10-CM | POA: Insufficient documentation

## 2020-12-01 DIAGNOSIS — I5032 Chronic diastolic (congestive) heart failure: Secondary | ICD-10-CM

## 2020-12-01 DIAGNOSIS — Z880 Allergy status to penicillin: Secondary | ICD-10-CM | POA: Diagnosis not present

## 2020-12-01 NOTE — Progress Notes (Signed)
ADVANCED HF CLINIC NOTE   Primary Cardiologist: Dr. Daneen Schick [facility]: Dr. Haroldine Laws   HPI:  [name] is a 70 y.o. male who was initially referred by Dr. Tamala Julian for evalaution of Pulmoanry HTN.   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 Other problems include hepatitis C (s/p curative therapy), chronic atrial flutter, ascending aortic aneurysm 4.5 cm, chronic kidney disease stage III, chronic anemia.     CTA of chest 6/20 neg for PE but dilated main pulmonary artery at 4.5 cm. + emphysema. Ectatic ascending aorta 4.4 cm.    Echo 7/20 EF 60-65%, moderately increased LV wall thickness.  Mod LVH. RV with severely reduced systolic function pressure moderately elevated at 54.9 mmHg mildly dilated aortic root 4.4 cm, mild AI s/p MVR with mean gradient of 5 , severe LAE, severe RV dysfunction mild TR estimated RVSP moderately elevated.    We saw him for the first time on 11/08/18. More SOB, more LE edema. NYHA IIIB symptoms. Underwent RHC to assess need for inotropes (see below).  Admitted 8/21 for GIB after recent polypectomy. EGD 8/16 with colonoscopy-found to have gastric polyps including sigmoid colon polyp 6 mm. Developed self-limiting bleed subsequent to this but 5 days later bleeding restarted despite holding Lovenox. Repeat colonoscopy 11/27/2019 blood in sigmoid colon clips placed at polypectomy sites. Received 3 units of blood.   Not felt to be a candidate for advanced therapies. 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 Not transplant candidate w CKD 4. Not ideal candidate for palliative inotropes given infection risk with mechanical MV.   In February 2022 he presented for outpatient EGD for polypectomy. Post procedure, he was altered and hypotensive. He required intubation for airway protection in the setting of acute  hypercarbic (pH 7.16 / pCO2 68) respiratory failure.  Per report, he had ST-T wave abnormalities.  He was admitted to the ICU for observation. Concern for potential sleep apnea and sleep evaluation was recommended. Cardiology was consulted per family request but nothing further was added.   EF down to 35-40% on echo in June. (Dr. Haroldine Laws reviewed and felt 50-55%). He was last seen for follow-up in July. Volume status appeared okay. Sleep study ordered and found to have severe OSA. Had lexiscan stress MPI 08/03 which revealed fixed apical and apical inferior defect suggestive of artifact and partially reversible basal to mid anterior defect, LVEF 54%.  He is here today for follow-up. Feels poorly. Complains of daytime fatigue. Awaiting CPAP titration. SOB walking up stairs. Also endorses bendopnea. Does ok walking on flat surfaces. Denies CP. Still endorses wt loss. Wt only down 2 lb from prior visit. Appetite is not "great". BP 118/67. HR 73 (chronic AFL). ReDS Clip 26%.     Cardiac Studies: - Echo (6/22): EF 35-40%, RV severely reduced, moderate AR, mod/severe PR - Echo (7/20):  EF normal. RV severely dilated and HK. RVSP 55  - RHC (7/21): Moderate mixed PH with moderately depressed cardiac output RA = 12 RV = 65/12 PA = 65/24 (41) PCW = 20 (v-wave 37) Fick cardiac output/index = 3.85/2.13 PVR = 5.5 FA sat = 98% PA sat = 52%, 54% PAPi = 3.4  - RHC (8/20): Moderate mixed pulmonary HTN with volume overload and moderately to severely reduced CO. No role for selective pulmonary artery vasodialtors at this point with elevated left-sided pressures.  RHC on 11/24/18       RA = 17      RV = 71/17      PA = 71/27 (45)      PCW = 27 (v = 21)      Fick cardiac output/index = 3.7/1.96      PVR = 5.0 WU      Ao sat = 98%      PA sat = 52%, 54%      PaPI = 3.17  Past Medical History:  Diagnosis Date   Allergy    Aneurysm of ascending aorta (HCC)    Atrial fibrillation (HCC)    BPH  (benign prostatic hyperplasia)    Colon polyps    FHx: rheumatic heart disease    H/O diplopia    Hepatitis C    Hypertension    Hypogonadism male    Mitral valve disease    mitral valve repair/replacement x3    Current Outpatient Medications  Medication Sig Dispense Refill   amoxicillin (AMOXIL) 500 MG capsule TAKE 4 CAPSULES BY MOUTH 1 HOUR BEFORE DENTAL APPOINTMENT 4 capsule 1   Efinaconazole 10 % SOLN Apply to toenail daily for 48 weeks 8 mL 3   furosemide (LASIX) 40 MG tablet Take one tablet by mouth every other day, alternating with taking a half tablet (20mg  total) every other day (Patient taking differently: No sig reported) 68 tablet 3   furosemide (LASIX) 40 MG tablet Take 40 mg by mouth.     metolazone (ZAROXOLYN) 2.5 MG tablet TAKE AS DIRECTED BY THE HEART FAILURE CLINIC 30 tablet 2   polyethylene glycol (MIRALAX) 17 g packet Take 17 g by mouth daily as needed. 14 each 0   potassium chloride SA (KLOR-CON) 20 MEQ tablet Take 1 tablet (20 mEq total) by mouth daily. 90 tablet 3   sildenafil (REVATIO) 20 MG tablet Take 20 mg by mouth daily as needed (ED).     spironolactone (ALDACTONE) 25 MG tablet TAKE 1/2 TABLET(12.5 MG) BY MOUTH DAILY 15 tablet 3   Testosterone 1.62 % GEL Apply 2 Pump topically daily. 1 pump on each arm     warfarin (COUMADIN) 5 MG tablet TAKE 1/2 TABLET BY MOUTH ON THURSDAY AND SATURDAY, AND THEN 1 TABLET ON "SUNDAY, MONDAY, TUESDAY, WEDNESDAY, AND FRIDAY 77 tablet 1   enoxaparin (LOVENOX) 60 MG/0.6ML injection Inject 0.6 mLs (60 mg total) into the skin daily. 6 mL 1   ferrous sulfate 325 (65 FE) MG tablet Take 1 tablet (325 mg total) by mouth every other day. In the evening. (Patient taking differently: Take 325 mg by mouth every other day. In the evening.)  3   No current facility-administered medications for this encounter.   Allergies  Allergen Reactions   Penicillins Rash    Did it involve swelling of the face/tongue/throat, SOB, or low BP? No Did it  involve sudden or severe rash/hives, skin peeling, or any reaction on the inside of your mouth or nose? No Did you need to seek medical attention at a hospital or doctor's office? No When did it last happen?      30"  + years If all above answers are "NO", may proceed with cephalosporin use.    Smoked < 1/2 ppd x many years. Quit 2-3 years ago  Social History   Socioeconomic History   Marital status: Divorced    Spouse name: Not on file   Number of children: 2   Years of education: 59  Highest education level: High school graduate  Occupational History   Not on file  Tobacco Use   Smoking status: Former    Types: Cigars    Quit date: 09/03/2017    Years since quitting: 3.2   Smokeless tobacco: Never  Vaping Use   Vaping Use: Never used  Substance and Sexual Activity   Alcohol use: No   Drug use: No   Sexual activity: Not Currently  Other Topics Concern   Not on file  Social History Narrative   Patient lives alone in [place].    Patient works out 4x a week on home exercise bike.    Patient plays guitar.    Social Determinants of Health   Financial Resource Strain: Low Risk    Difficulty of Paying Living Expenses: Not hard at all  Food Insecurity: No Food Insecurity   Worried About Charity fundraiser in the Last Year: Never true   [place] in the Last Year: Never true  Transportation Needs: No Transportation Needs   Lack of Transportation (Medical): No   Lack of Transportation (Non-Medical): No  Physical Activity: Insufficiently Active   Days of Exercise per Week: 4 days   Minutes of Exercise per Session: 20 min  Stress: No Stress Concern Present   Feeling of Stress : Only a little  Social Connections: Socially Isolated   Frequency of Communication with Friends and Family: More than three times a week   Frequency of Social Gatherings with Friends and Family: More than three times a week   Attends Religious Services: Never   Marine scientist or  Organizations: No   Attends Music therapist: Never   Marital Status: Divorced  Human resources officer Violence: Not At Risk   Fear of Current or Ex-Partner: No   Emotionally Abused: No   Physically Abused: No   Sexually Abused: No   Fhx: No FHX of premature CAD or SCD. Personally reviewed  BP 118/67   Pulse 73   Wt 57.2 kg (126 lb)   SpO2 98%   BMI 17.45 kg/m   Wt Readings from Last 3 Encounters:  12/01/20 57.2 kg (126 lb)  11/25/20 57.1 kg (125 lb 14.1 oz)  11/25/20 57.6 kg (127 lb)   PHYSICAL EXAM: ReDS Clip 26%  General:  thin AAM. No respiratory difficulty. Very flat affect  HEENT: normal Neck: supple. no JVD. Carotids 2+ bilat; no bruits. No lymphadenopathy or thyromegaly appreciated. Cor: PMI nondisplaced. Irregular rhythm and rate. + crisp mechanical valve sounds  Abdomen: soft, nontender, nondistended. No hepatosplenomegaly. No bruits or masses. Good bowel sounds. Extremities: no cyanosis, clubbing, rash, trace bilateral ankle edema, L>R Neuro: alert & oriented x 3, cranial nerves grossly intact. moves all 4 extremities w/o difficulty. Affect flat   ASSESSMENT & PLAN:   1.  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 RV severely dilated and HK. RVSP 55 - RHC 8/20 with significantly elevated biventricular pressures, moderate to severe mixed PAH and low output (CI 1.96) - M-spike negative. PYP 1/21 negative for amyloid.  - Echo (6/22): newly reduced EF 35-40%, RV severely reduced, moderate AR, mod/severe PR. (Dr. Haroldine Laws felt EF 50-55%) - Lexiscan stress MPI, 08/22: Partially reversible basal to mid anterior defect consistent w/ infarct and peri-infarct ischemia EF 54% - NYHA II-III. Mild peripheral edema on exam but no pulmonary congestion, ReDs Clip 26% - Continue  lasix 40 mg daily, alternating with 20 mg every other day. - Continue to take  metolazone as needed (carefully). - Encouraged compression hoses. - Increase spiro to 25 mg daily. - No Entresto/ digoxin w/ CKD - Check BMP in 2 weeks  - Participating in Cardiopulmonary Rehab.  - Advanced options very limited. 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 Not ideal candidate for palliative inotropes given infection risk with mechanical MV.  2. 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   - Stable on last echo. - On coumadin, INR followed by Coumadin clinic.  - Aware of need for SBE prophylaxis. Requesting amoxicillin refill    3.  Chronic a flutter  - Rate controlled, on Coumadin.    4.  Thoracic aneurysm, followed by Dr. Cyndia Bent  - CT chest 6/20 asc ao aorta 4.4 cm.   - Not surgical candidate.  5. Stage IV CKD - Followed by nephrology. - Baseline SCr~2.4. - Check BMP in 2 weeks   6. 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 Negative for high-grade dysplasia/carcinoma - Followed routinely by GI w/ recs for f/u EGD 05/2021 - H/o GIB/ chronic anemia. Denies any recent gross bleeding.   7. OSA - Severe, newly diagnosed  - Awaiting CPAP titration  - Dr. Radford Pax following    F/u in 4-6 weeks   Lyda Jester, PA-C  12/01/20.  Patient seen and examined with the above-signed Advanced Practice Provider and/or Housestaff. I personally reviewed laboratory data, imaging studies and relevant notes. I independently examined the patient and formulated the important aspects of the plan. I have edited the note to reflect any of my changes or salient points. I have personally discussed the plan with the patient and/or family.  Remains NYHA III with prominent fatigue. Recently diagnosed with severe OSA so suspect that is contributing. Pending CPAP titration. Volume status well controlled. Struggling to gain weight.   Recent echo reviewed EF 50-55% with significant RV dysfunction. PYP negative.   General:  Thin male No resp difficulty HEENT:  normal Neck: supple. nVP 8  Carotids 2+ bilat; no bruits. No lymphadenopathy or thryomegaly appreciated. Cor: PMI nondisplaced. Regular rate & rhythm. Mechanical s1 Lungs: clear Abdomen: soft, nontender, nondistended. No hepatosplenomegaly. No bruits or masses. Good bowel sounds. Extremities: no cyanosis, clubbing, rash, tr edema Neuro: alert & orientedx3, cranial nerves grossly intact. moves all 4 extremities w/o difficulty. Affect pleasant  Remains NYHA III. Volume status ok. Pending CPAP titration and hopefully this will help with fatigue. Will increase spiro to 25 mg daily (having trouble splitting pill). Check BMET in 2 weeks. Discussed strategies for weight gain (he is also working with nutritionist).   Glori Bickers, MD  11:18 PM

## 2020-12-01 NOTE — Patient Instructions (Addendum)
RedsClip done today.   No Labs done today.   INCREASE Spironolactone to 25mg  (1 tablet) by mouth daily.   No other medication changes were made. Please continue all current medications as prescribed.  Your physician recommends that you schedule a follow-up appointment in: 2 weeks for a lab only appointment and in 4-6 months with Dr. Haroldine Laws. We will contact you to schedule your follow up with Dr. Haroldine Laws.   If you have any questions or concerns before your next appointment please send [place] a message through [facility] or call our office at [phone].    TO LEAVE A MESSAGE FOR THE NURSE SELECT OPTION 2, PLEASE LEAVE A MESSAGE INCLUDING: YOUR NAME DATE OF BIRTH CALL BACK NUMBER REASON FOR CALL**this is important as we prioritize the call backs  YOU WILL RECEIVE A CALL BACK THE SAME DAY AS LONG AS YOU CALL BEFORE 4:00 PM   Do the following things EVERYDAY: Weigh yourself in the morning before breakfast. Write it down and keep it in a log. Take your medicines as prescribed Eat low salt foods--Limit salt (sodium) to 2000 mg per day.  Stay as active as you can everyday Limit all fluids for the day to less than 2 liters   At the [facility], you and your health needs are our priority. As part of our continuing mission to provide you with exceptional heart care, we have created designated Provider Care Teams. These Care Teams include your primary Cardiologist (physician) and Advanced Practice Providers (APPs- Physician Assistants and Nurse Practitioners) who all work together to provide you with the care you need, when you need it.   You may see any of the following providers on your designated Care Team at your next follow up: Dr Glori Bickers Dr Haynes Kerns, NP Lyda Jester, [place] Audry Riles, PharmD   Please be sure to bring in all your medications bottles to every appointment.

## 2020-12-01 NOTE — Progress Notes (Signed)
ReDS Vest / Clip - 12/01/20 1200       ReDS Vest / Clip   Station Marker C    Ruler Value 22    ReDS Value Range Low volume    ReDS Actual Value 26

## 2020-12-02 ENCOUNTER — Encounter (HOSPITAL_COMMUNITY)
Admission: RE | Admit: 2020-12-02 | Discharge: 2020-12-02 | Disposition: A | Discharge status: Home / Self Care | Payer: Medicare HMO | Source: Ambulatory Visit | Attending: Internal Medicine | Admitting: Internal Medicine

## 2020-12-02 DIAGNOSIS — I5032 Chronic diastolic (congestive) heart failure: Secondary | ICD-10-CM

## 2020-12-02 NOTE — Progress Notes (Signed)
Daily Session Note  Patient Details  Name: [name] MRN: [id number] Date of Birth: 01-03-1951 Referring Provider:   April Manson Pulmonary Rehab Walk Test from 11/10/2020 in [place]  Referring Provider Dr. Haroldine Laws       Encounter Date: 12/02/2020  Check In:  Session Check In - 12/02/20 1143       Check-In   Supervising physician immediately available to respond to emergencies [facility] immediately available    Physician(s) Dr. Doristine Bosworth    Location [facility]    Staff Present Rodney Langton, Cathleen Fears, MS, ACSM-CEP, Exercise Physiologist;Khali Perella Leonia Reeves, RN, Deland Pretty, MS, ACSM CEP, Exercise Physiologist    Virtual Visit No    Medication changes reported     No    Fall or balance concerns reported    No    Tobacco Cessation No Change    Warm-up and Cool-down Performed as group-led instruction    Resistance Training Performed Yes    VAD Patient? No      Pain Assessment   Currently in Pain? No/denies    Pain Score 0-No pain    Multiple Pain Sites No             Capillary Blood Glucose: No results found for this or any previous visit (from the past 24 hour(s)).    Social History   Tobacco Use  Smoking Status Former   Types: Cigars   Quit date: 09/03/2017   Years since quitting: 3.2  Smokeless Tobacco Never    Goals Met:  Proper associated with RPD/PD & O2 Sat Exercise tolerated well No report of concerns or symptoms today Strength training completed today  Goals Unmet:  Not Applicable  Comments: Service time is from 1015 to [place]    Dr. Fransico Him is Medical Director for Cardiac Rehab at [facility].

## 2020-12-03 ENCOUNTER — Ambulatory Visit (INDEPENDENT_AMBULATORY_CARE_PROVIDER_SITE_OTHER): Payer: Medicare HMO

## 2020-12-03 ENCOUNTER — Other Ambulatory Visit: Payer: Self-pay

## 2020-12-03 DIAGNOSIS — I059 Rheumatic mitral valve disease, unspecified: Secondary | ICD-10-CM | POA: Diagnosis not present

## 2020-12-03 DIAGNOSIS — Z5181 Encounter for therapeutic drug level monitoring: Secondary | ICD-10-CM

## 2020-12-03 LAB — POCT INR: INR: 1.9 — AB (ref 2.0–3.0)

## 2020-12-03 NOTE — Patient Instructions (Signed)
Description   Take 2 tablets today and then START taking Warfarin 1 tablet daily except for 1/2 tablet on Sundays and Thursdays. Recheck INR in 2 weeks. Coumadin Clinic [phone].

## 2020-12-04 ENCOUNTER — Encounter (HOSPITAL_COMMUNITY)
Admission: RE | Admit: 2020-12-04 | Discharge: 2020-12-04 | Disposition: A | Discharge status: Home / Self Care | Payer: Medicare HMO | Source: Ambulatory Visit | Attending: Internal Medicine | Admitting: Internal Medicine

## 2020-12-04 DIAGNOSIS — I5032 Chronic diastolic (congestive) heart failure: Secondary | ICD-10-CM | POA: Diagnosis not present

## 2020-12-04 NOTE — Progress Notes (Signed)
Daily Session Note  Patient Details  Name: [name] MRN: [id number] Date of Birth: 1951/03/08 Referring Provider:   April Manson Pulmonary Rehab Walk Test from 11/10/2020 in [place]  Referring Provider Dr. Haroldine Laws       Encounter Date: 12/04/2020  Check In:  Session Check In - 12/04/20 1125       Check-In   Supervising physician immediately available to respond to emergencies [facility] immediately available    Physician(s) Dr. Doristine Bosworth    Location [facility]    Staff Present Rosebud Poles, RN, Quentin Ore, MS, ACSM-CEP, Exercise Physiologist;Lisa Ysidro Evert, RN    Virtual Visit No    Medication changes reported     No    Fall or balance concerns reported    No    Tobacco Cessation No Change    Warm-up and Cool-down Performed as group-led instruction    Resistance Training Performed Yes    VAD Patient? No    PAD/SET Patient? No      Pain Assessment   Currently in Pain? No/denies    Multiple Pain Sites No             Capillary Blood Glucose: No results found for this or any previous visit (from the past 24 hour(s)).    Social History   Tobacco Use  Smoking Status Former   Types: Cigars   Quit date: 09/03/2017   Years since quitting: 3.2  Smokeless Tobacco Never    Goals Met:  Proper associated with RPD/PD & O2 Sat Exercise tolerated well No report of concerns or symptoms today Strength training completed today  Goals Unmet:  Not Applicable  Comments: Service time is from 1020 to 1131.    Dr. Fransico Him is Medical Director for Cardiac Rehab at [facility].

## 2020-12-09 ENCOUNTER — Encounter (HOSPITAL_COMMUNITY)
Admission: RE | Admit: 2020-12-09 | Discharge: 2020-12-09 | Disposition: A | Discharge status: Home / Self Care | Payer: Medicare HMO | Source: Ambulatory Visit | Attending: Internal Medicine | Admitting: Internal Medicine

## 2020-12-09 ENCOUNTER — Other Ambulatory Visit: Payer: Self-pay

## 2020-12-09 VITALS — Wt 128.5 lb

## 2020-12-09 DIAGNOSIS — I5032 Chronic diastolic (congestive) heart failure: Secondary | ICD-10-CM | POA: Diagnosis not present

## 2020-12-09 NOTE — Progress Notes (Signed)
Daily Session Note  Patient Details  Name: DAYMEIN NUNNERY MRN: [id number] Date of Birth: 01-26-1951 Referring Provider:   April Manson Pulmonary Rehab Walk Test from 11/10/2020 in [place]  Referring Provider Dr. Haroldine Laws       Encounter Date: 12/09/2020  Check In:  Session Check In - 12/09/20 1118       Check-In   Supervising physician immediately available to respond to emergencies [facility] immediately available    Physician(s) Dr. Doristine Bosworth    [facility]    Staff Present Rosebud Poles, RN, Quentin Ore, MS, ACSM-CEP, Exercise Physiologist;Frederik Standley Ysidro Evert, RN    Virtual Visit No    Medication changes reported     No    Fall or balance concerns reported    No    Tobacco Cessation No Change    Warm-up and Cool-down Performed as group-led instruction    Resistance Training Performed Yes    VAD Patient? No    PAD/SET Patient? No      Pain Assessment   Currently in Pain? No/denies    Multiple Pain Sites No             Capillary Blood Glucose: No results found for this or any previous visit (from the past 24 hour(s)).   Exercise Prescription Changes - 12/09/20 1100       Response to Exercise   Blood Pressure (Admit) 110/70    Blood Pressure (Exercise) 100/68    Blood Pressure (Exit) 110/60    Heart Rate (Admit) 85 bpm    Heart Rate (Exercise) 102 bpm    Heart Rate (Exit) 65 bpm    Oxygen Saturation (Admit) 100 %    Oxygen Saturation (Exercise) 98 %    Oxygen Saturation (Exit) 100 %    Rating of Perceived Exertion (Exercise) 13    Perceived Dyspnea (Exercise) 1    Duration Progress to 30 minutes of  aerobic without signs/symptoms of physical distress    Intensity THRR unchanged      Progression   Progression Continue to progress workloads to maintain intensity without signs/symptoms of physical distress.      Resistance Training   Training Prescription Yes    Weight Red Bands    Reps 10-15     Time 10 Minutes      NuStep   Level 2    SPM 60    Minutes 15    METs 1.5      Arm Ergometer   Level 1    RPM 25    Minutes 15             Social History   Tobacco Use  Smoking Status Former   Types: Cigars   Quit date: 09/03/2017   Years since quitting: 3.2  Smokeless Tobacco Never    Goals Met:  No report of concerns or symptoms today Strength training completed today  Goals Unmet:  Not Applicable  Comments: Service time is from 1019 to 52    Dr. Fransico Him is Medical Director for Cardiac Rehab at [facility].

## 2020-12-11 ENCOUNTER — Other Ambulatory Visit: Payer: Self-pay

## 2020-12-11 ENCOUNTER — Encounter (HOSPITAL_COMMUNITY)
Admission: RE | Admit: 2020-12-11 | Discharge: 2020-12-11 | Disposition: A | Discharge status: Home / Self Care | Payer: Medicare HMO | Source: Ambulatory Visit | Attending: Internal Medicine | Admitting: Internal Medicine

## 2020-12-11 DIAGNOSIS — I5032 Chronic diastolic (congestive) heart failure: Secondary | ICD-10-CM | POA: Diagnosis not present

## 2020-12-11 NOTE — Progress Notes (Signed)
Daily Session Note  Patient Details  Name: [name] MRN: [id number] Date of Birth: [birth year] Referring Provider:   April Manson Pulmonary Rehab Walk Test from 11/10/2020 in [place]  Referring Provider Dr. Haroldine Laws       Encounter Date: 12/11/2020  Check In:  Session Check In - 12/11/20 1111       Check-In   Supervising physician immediately available to respond to emergencies [facility] immediately available    Physician(s) Dr. Florene Glen    [facility]    Staff Present Rosebud Poles, RN, Quentin Ore, MS, ACSM-CEP, Exercise Physiologist;Lisa Ysidro Evert, RN    Virtual Visit No    Medication changes reported     No    Fall or balance concerns reported    No    Tobacco Cessation No Change    Warm-up and Cool-down Performed as group-led instruction    Resistance Training Performed Yes    VAD Patient? No    PAD/SET Patient? No      Pain Assessment   Currently in Pain? Yes    Pain Score 5     Pain Location Shoulder    Pain Orientation Left    Pain Descriptors / Indicators Sharp    Pain Type Acute pain    Pain Onset Yesterday    Pain Frequency Rarely    Aggravating Factors  Picked an item up in  weird way    Multiple Pain Sites No             Capillary Blood Glucose: No results found for this or any previous visit (from the past 24 hour(s)).   Exercise Prescription Changes - 12/11/20 1100       Home Exercise Plan   Plans to continue exercise at Home (comment)   Patient has bike at home   Frequency Add 1 additional day to program exercise sessions.   Patient already meets exercise guidelines   Initial Home Exercises Provided 12/11/20             Social History   Tobacco Use  Smoking Status Former   Types: Cigars   Quit date: 09/03/2017   Years since quitting: 3.2  Smokeless Tobacco Never    Goals Met:  Proper associated with RPD/PD & O2 Sat Exercise tolerated well No report of  concerns or symptoms today Strength training completed today  Goals Unmet:  Not Applicable  Comments: Service time is from 1018  to 1128    Dr. Fransico Him is Medical Director for Cardiac Rehab at [facility].

## 2020-12-11 NOTE — Progress Notes (Signed)
I have reviewed a Home Exercise Prescription with [name]. Muzamil is currently exercising at home. The patient was advised to continue current exercise plan. Aniceto is exercising on his bike for 5 non-rehab days/wk for 2x15-20 min/day. He also does resistance band exercises for 10 reps. Hobson and I discussed how to progress their exercise prescription. I advised Graycen to increase his level on the bike and perform 12 reps instead of 10. The patient stated that their goals were to gain weight to reach healthy weight.  The patient stated that they understand the exercise prescription.  We reviewed exercise guidelines, target heart rate during exercise, RPE Scale, weather conditions, endpoints for exercise, warmup and cool down.  Patient is encouraged to come to me with any questions. I will continue to follow up with the patient to assist them with progression and safety.    Sheppard Plumber, MS, ACSM-CEP 12/11/2020 11:44 AM

## 2020-12-15 ENCOUNTER — Other Ambulatory Visit: Payer: Self-pay

## 2020-12-15 ENCOUNTER — Encounter (HOSPITAL_BASED_OUTPATIENT_CLINIC_OR_DEPARTMENT_OTHER): Payer: Self-pay | Admitting: Cardiology

## 2020-12-15 ENCOUNTER — Ambulatory Visit (HOSPITAL_BASED_OUTPATIENT_CLINIC_OR_DEPARTMENT_OTHER): Payer: Medicare HMO | Attending: Cardiology | Admitting: Cardiology

## 2020-12-15 DIAGNOSIS — G4733 Obstructive sleep apnea (adult) (pediatric): Secondary | ICD-10-CM | POA: Insufficient documentation

## 2020-12-15 HISTORY — DX: Obstructive sleep apnea (adult) (pediatric): G47.33

## 2020-12-16 ENCOUNTER — Encounter (HOSPITAL_COMMUNITY)
Admission: RE | Admit: 2020-12-16 | Discharge: 2020-12-16 | Disposition: A | Discharge status: Home / Self Care | Payer: Medicare HMO | Source: Ambulatory Visit | Attending: Internal Medicine | Admitting: Internal Medicine

## 2020-12-16 NOTE — Progress Notes (Deleted)
Pulmonary Individual Treatment Plan  Patient Details  Name: [name] MRN: [id number] Date of Birth: 1950-06-12 Referring Provider:   April Manson Pulmonary Rehab Walk Test from 11/10/2020 in [place]  Referring Provider Dr. Haroldine Laws       Initial Encounter Date:  Flowsheet Row Pulmonary Rehab Walk Test from 11/10/2020 in [place]  Date 11/10/20       Visit Diagnosis: Chronic diastolic heart failure ([place])  Patient's Home Medications on Admission:   Current Outpatient Medications:    amoxicillin (AMOXIL) 500 MG capsule, TAKE 4 CAPSULES BY MOUTH 1 HOUR BEFORE DENTAL APPOINTMENT, Disp: 4 capsule, Rfl: 1   Efinaconazole 10 % SOLN, Apply to toenail daily for 48 weeks, Disp: 8 mL, Rfl: 3   enoxaparin (LOVENOX) 60 MG/0.6ML injection, Inject 0.6 mLs (60 mg total) into the skin daily., Disp: 6 mL, Rfl: 1   ferrous sulfate 325 (65 FE) MG tablet, Take 1 tablet (325 mg total) by mouth every other day. In the evening. (Patient taking differently: Take 325 mg by mouth every other day. In the evening.), Disp: , Rfl: 3   furosemide (LASIX) 40 MG tablet, Take 1 tablet (40 mg total) by mouth daily. TAKE 1 TABLET(40 MG) BY MOUTH DAILY, Disp: 90 tablet, Rfl: 3   metolazone (ZAROXOLYN) 2.5 MG tablet, TAKE AS DIRECTED BY THE HEART FAILURE CLINIC, Disp: 30 tablet, Rfl: 2   polyethylene glycol (MIRALAX) 17 g packet, Take 17 g by mouth daily as needed., Disp: 14 each, Rfl: 0   potassium chloride SA (KLOR-CON) 20 MEQ tablet, Take 1 tablet (20 mEq total) by mouth daily., Disp: 90 tablet, Rfl: 3   sildenafil (REVATIO) 20 MG tablet, Take 20 mg by mouth daily as needed (ED)., Disp: , Rfl:    spironolactone (ALDACTONE) 25 MG tablet, Take 1 tablet (25 mg total) by mouth daily., Disp: 90 tablet, Rfl: 3   Testosterone 1.62 % GEL, Apply 2 Pump topically daily. 1 pump on each arm, Disp: , Rfl:    warfarin (COUMADIN) 5 MG tablet, TAKE 1/2 TABLET BY  MOUTH ON THURSDAY AND SATURDAY, AND THEN 1 TABLET ON SUNDAY, MONDAY, TUESDAY, WEDNESDAY, AND FRIDAY, Disp: 77 tablet, Rfl: 1  Past Medical History: Past Medical History:  Diagnosis Date   Allergy    Aneurysm of ascending aorta (HCC)    Atrial fibrillation (HCC)    BPH (benign prostatic hyperplasia)    Colon polyps    FHx: rheumatic heart disease    H/O diplopia    Hepatitis C    Hypertension    Hypogonadism male    Mitral valve disease    mitral valve repair/replacement x3   OSA (obstructive sleep apnea) 12/15/2020    Tobacco Use: Social History   Tobacco Use  Smoking Status Former   Types: Cigars   Quit date: 09/03/2017   Years since quitting: 3.2  Smokeless Tobacco Never    Labs: Recent Review Flowsheet Data     Labs for ITP Cardiac and Pulmonary Rehab Latest Ref Rng & Units 10/23/2019 11/26/2019 05/07/2020 05/07/2020 05/07/2020   PHART 7.350 - 7.450 - - 7.160(LL) 7.272(L) -   PCO2ART 32.0 - 48.0 mmHg - - 68.8(HH) 57.3(H) -   HCO3 20.0 - 28.0 mmol/L 23.6 - 23.5 25.6 23.0   TCO2 22 - 32 mmol/L 25 25 - - -   ACIDBASEDEF 0.0 - 2.0 mmol/L 2.0 - 5.4(H) 1.1 2.8(H)   O2SAT % 54.0 - 44.8 26.9 49.2  Capillary Blood Glucose: Lab Results  Component Value Date   GLUCAP 140 (H) 05/07/2020     Pulmonary Assessment Scores:  Pulmonary Assessment Scores     Row Name 11/10/20 1544         ADL UCSD   ADL Phase Entry     SOB Score total 41           CAT Score   CAT Score 13           mMRC Score   mMRC Score 3             UCSD: Self-administered rating of dyspnea associated with activities of daily living (ADLs) 6-point scale (0 = "not at all" to 5 = "maximal or unable to do because of breathlessness")  Scoring Scores range from 0 to 120.  Minimally important difference is 5 units  CAT: CAT can identify the health impairment of COPD patients and is better correlated with disease progression.  CAT has a scoring range of zero to 40. The CAT score is classified  into four groups of low (less than 10), medium (10 - 20), high (21-30) and very high (31-40) based on the impact level of disease on health status. A CAT score over 10 suggests significant symptoms.  A worsening CAT score could be explained by an exacerbation, poor medication adherence, poor inhaler technique, or progression of COPD or comorbid conditions.  CAT MCID is 2 points  mMRC: mMRC (Modified Medical Research Council) Dyspnea Scale is used to assess the degree of baseline functional disability in patients of respiratory disease due to dyspnea. No minimal important difference is established. A decrease in score of 1 point or greater is considered a positive change.   Pulmonary Function Assessment:  Pulmonary Function Assessment - 11/10/20 1055       Breath   Shortness of Breath Yes;Limiting activity;Fear of Shortness of Breath             Exercise Target Goals: Exercise Program Goal: Individual exercise prescription set using results from initial 6 min walk test and THRR while considering  patient's activity barriers and safety.   Exercise Prescription Goal: Initial exercise prescription builds to 30-45 minutes a day of aerobic activity, 2-3 days per week.  Home exercise guidelines will be given to patient during program as part of exercise prescription that the participant will acknowledge.  Activity Barriers & Risk Stratification:  Activity Barriers & Cardiac Risk Stratification - 11/10/20 1052       Activity Barriers & Cardiac Risk Stratification   Activity Barriers Shortness of Breath;Neck/Spine Problems;Deconditioning             6 Minute Walk:  6 Minute Walk     Row Name 11/10/20 1529         6 Minute Walk   Phase Initial     Distance 1011 feet     Walk Time 6 minutes     # of Rest Breaks 0     MPH 1.91     METS 3.33     RPE 13     Perceived Dyspnea  3     Symptoms Yes (comment)     Comments Pt stopped walk test at 5:30 due to SOB, stated he could  not go any further     Resting HR 71 bpm     Resting BP 85/50     Resting Oxygen Saturation  98 %     Exercise Oxygen Saturation  during 6 min  walk 94 %     Max Ex. HR 115 bpm     Max Ex. BP 126/64     2 Minute Post BP 120/64           Interval HR   1 Minute HR 87     2 Minute HR 96     3 Minute HR 105     4 Minute HR 110     5 Minute HR 115     6 Minute HR 110     2 Minute Post HR 82     Interval Heart Rate? Yes           Interval Oxygen   Interval Oxygen? Yes     Baseline Oxygen Saturation % 98 %     1 Minute Oxygen Saturation % 98 %     1 Minute Liters of Oxygen 0 L     2 Minute Oxygen Saturation % 96 %     2 Minute Liters of Oxygen 0 L     3 Minute Oxygen Saturation % 95 %     3 Minute Liters of Oxygen 0 L     4 Minute Oxygen Saturation % 94 %     4 Minute Liters of Oxygen 0 L     5 Minute Oxygen Saturation % 94 %     5 Minute Liters of Oxygen 0 L     6 Minute Oxygen Saturation % 95 %     6 Minute Liters of Oxygen 0 L     2 Minute Post Oxygen Saturation % 98 %     2 Minute Post Liters of Oxygen 0 L              Oxygen Initial Assessment:  Oxygen Initial Assessment - 11/10/20 1054       Home Oxygen   Home Oxygen Device None    Sleep Oxygen Prescription None    Home Exercise Oxygen Prescription None    Home Resting Oxygen Prescription None      Initial 6 min Walk   Oxygen Used None      Program Oxygen Prescription   Program Oxygen Prescription None      Intervention   Short Term Goals To learn and understand importance of monitoring SPO2 with pulse oximeter and demonstrate accurate use of the pulse oximeter.;To learn and understand importance of maintaining oxygen saturations>88%;To learn and demonstrate proper pursed lip breathing techniques or other breathing techniques. ;To learn and demonstrate proper use of respiratory medications    Long  Term Goals Verbalizes importance of monitoring SPO2 with pulse oximeter and return demonstration;Maintenance  of O2 saturations>88%;Exhibits proper breathing techniques, such as pursed lip breathing or other method taught during program session;Compliance with respiratory medication;Demonstrates proper use of MDI's             Oxygen Re-Evaluation:  Oxygen Re-Evaluation     Row Name 12/12/20 0859             Program Oxygen Prescription   Program Oxygen Prescription None               Home Oxygen   Home Oxygen Device None       Sleep Oxygen Prescription None       Home Exercise Oxygen Prescription None       Home Resting Oxygen Prescription None               Goals/Expected Outcomes   Short Term Goals To  learn and understand importance of monitoring SPO2 with pulse oximeter and demonstrate accurate use of the pulse oximeter.;To learn and understand importance of maintaining oxygen saturations>88%;To learn and demonstrate proper pursed lip breathing techniques or other breathing techniques. ;To learn and demonstrate proper use of respiratory medications       Long  Term Goals Verbalizes importance of monitoring SPO2 with pulse oximeter and return demonstration;Maintenance of O2 saturations>88%;Exhibits proper breathing techniques, such as pursed lip breathing or other method taught during program session;Compliance with respiratory medication;Demonstrates proper use of MDI's       Goals/Expected Outcomes Compliance and understanding of monitoring oxygen saturation and importance of pursed lip breathing                Oxygen Discharge (Final Oxygen Re-Evaluation):  Oxygen Re-Evaluation - 12/12/20 0859       Program Oxygen Prescription   Program Oxygen Prescription None      Home Oxygen   Home Oxygen Device None    Sleep Oxygen Prescription None    Home Exercise Oxygen Prescription None    Home Resting Oxygen Prescription None      Goals/Expected Outcomes   Short Term Goals To learn and understand importance of monitoring SPO2 with pulse oximeter and demonstrate accurate use  of the pulse oximeter.;To learn and understand importance of maintaining oxygen saturations>88%;To learn and demonstrate proper pursed lip breathing techniques or other breathing techniques. ;To learn and demonstrate proper use of respiratory medications    Long  Term Goals Verbalizes importance of monitoring SPO2 with pulse oximeter and return demonstration;Maintenance of O2 saturations>88%;Exhibits proper breathing techniques, such as pursed lip breathing or other method taught during program session;Compliance with respiratory medication;Demonstrates proper use of MDI's    Goals/Expected Outcomes Compliance and understanding of monitoring oxygen saturation and importance of pursed lip breathing             Initial Exercise Prescription:  Initial Exercise Prescription - 11/10/20 1500       Date of Initial Exercise RX and Referring Provider   Date 11/10/20    Referring Provider Dr. Haroldine Laws    Expected Discharge Date 01/15/21      NuStep   Level 2    SPM 80    Minutes 15      Arm Ergometer   Level 1.5    RPM 40    Minutes 15      Prescription Details   Frequency (times per week) 2    Duration Progress to 30 minutes of continuous aerobic without signs/symptoms of physical distress      Intensity   THRR 40-80% of Max Heartrate 61-122    Ratings of Perceived Exertion 11-13    Perceived Dyspnea 0-4      Progression   Progression Continue to progress workloads to maintain intensity without signs/symptoms of physical distress.      Resistance Training   Training Prescription Yes    Weight red bands    Reps 10-15             Perform Capillary Blood Glucose checks as needed.  Exercise Prescription Changes:   Exercise Prescription Changes     Row Name 11/25/20 1200 12/09/20 1100 12/11/20 1100         Response to Exercise   Blood Pressure (Admit) 104/50 110/70 --     Blood Pressure (Exercise) 110/58 100/68 --     Blood Pressure (Exit) 98/60 110/60 --      Heart Rate (Admit) 80 bpm 85  bpm --     Heart Rate (Exercise) 78 bpm 102 bpm --     Heart Rate (Exit) 76 bpm 65 bpm --     Oxygen Saturation (Admit) 100 % 100 % --     Oxygen Saturation (Exercise) 98 % 98 % --     Oxygen Saturation (Exit) 100 % 100 % --     Rating of Perceived Exertion (Exercise) 12 13 --     Perceived Dyspnea (Exercise) 1 1 --     Duration Progress to 30 minutes of  aerobic without signs/symptoms of physical distress Progress to 30 minutes of  aerobic without signs/symptoms of physical distress --     Intensity Other (comment)  40-80% of HRR THRR unchanged --           Progression   Progression Continue to progress workloads to maintain intensity without signs/symptoms of physical distress. Continue to progress workloads to maintain intensity without signs/symptoms of physical distress. --           Horticulturist, commercial Prescription Yes Yes --     Weight Red Bands Red Bands --     Reps 10-15 10-15 --     Time 10 Minutes 10 Minutes --           NuStep   Level 2 2 --     SPM 80 60 --     Minutes 15 15 --     METs -- 1.5 --           Arm Ergometer   Level 1 1 --     RPM 23 25 --     Minutes 15 15 --           Home Exercise Plan   Plans to continue exercise at -- -- Home (comment)  Patient has bike at home     Frequency -- -- Add 1 additional day to program exercise sessions.  Patient already meets exercise guidelines     Initial Home Exercises Provided -- -- 12/11/20              Exercise Comments:   Exercise Comments     Row Name 11/18/20 1435 12/11/20 1138         Exercise Comments Patient completed first day of exercise and tolerated well with no complaints or concerns. He was able to do 15 minutes on the Nustep and 15 minutes on the arm ergometer. He also tolerated the warm up/cool down and resistance training with no issues. Completed home exercise plan. Pt is currently biking at home for 2x15-20 min on five non-rehab days/wk. Pt  exercises at a fairly light-somewhat hard intensity. Pt also performs resistance band exercises for 10 reps. Encourage pt to continue current exercise plan. Recommended increasing number of reps for resistance band exercises.               Exercise Goals and Review:   Exercise Goals     Row Name 11/10/20 1155 11/18/20 0853 12/12/20 0901         Exercise Goals   Increase Physical Activity Yes -- Yes     Intervention Provide advice, education, support and counseling about physical activity/exercise needs.;Develop an individualized exercise prescription for aerobic and resistive training based on initial evaluation findings, risk stratification, comorbidities and participant's personal goals. -- Provide advice, education, support and counseling about physical activity/exercise needs.;Develop an individualized exercise prescription for aerobic and resistive training based on initial evaluation findings, risk stratification,  comorbidities and participant's personal goals.     Expected Outcomes Short Term: Attend rehab on a regular basis to increase amount of physical activity.;Long Term: Add in home exercise to make exercise part of routine and to increase amount of physical activity.;Long Term: Exercising regularly at least 3-5 days a week. -- Short Term: Attend rehab on a regular basis to increase amount of physical activity.;Long Term: Add in home exercise to make exercise part of routine and to increase amount of physical activity.;Long Term: Exercising regularly at least 3-5 days a week.     Increase Strength and Stamina Yes -- Yes     Intervention Provide advice, education, support and counseling about physical activity/exercise needs.;Develop an individualized exercise prescription for aerobic and resistive training based on initial evaluation findings, risk stratification, comorbidities and participant's personal goals. -- Provide advice, education, support and counseling about physical  activity/exercise needs.;Develop an individualized exercise prescription for aerobic and resistive training based on initial evaluation findings, risk stratification, comorbidities and participant's personal goals.     Expected Outcomes Short Term: Increase workloads from initial exercise prescription for resistance, speed, and METs.;Short Term: Perform resistance training exercises routinely during rehab and add in resistance training at home;Long Term: Improve cardiorespiratory fitness, muscular endurance and strength as measured by increased METs and functional capacity (6MWT) -- Short Term: Increase workloads from initial exercise prescription for resistance, speed, and METs.;Short Term: Perform resistance training exercises routinely during rehab and add in resistance training at home;Long Term: Improve cardiorespiratory fitness, muscular endurance and strength as measured by increased METs and functional capacity (6MWT)     Able to understand and use rate of perceived exertion (RPE) scale Yes -- Yes     Intervention Provide education and explanation on how to use RPE scale -- Provide education and explanation on how to use RPE scale     Expected Outcomes Short Term: Able to use RPE daily in rehab to express subjective intensity level;Long Term:  Able to use RPE to guide intensity level when exercising independently -- Short Term: Able to use RPE daily in rehab to express subjective intensity level;Long Term:  Able to use RPE to guide intensity level when exercising independently     Able to understand and use Dyspnea scale Yes -- Yes     Intervention Provide education and explanation on how to use Dyspnea scale -- Provide education and explanation on how to use Dyspnea scale     Expected Outcomes Short Term: Able to use Dyspnea scale daily in rehab to express subjective sense of shortness of breath during exertion;Long Term: Able to use Dyspnea scale to guide intensity level when exercising  independently -- Short Term: Able to use Dyspnea scale daily in rehab to express subjective sense of shortness of breath during exertion;Long Term: Able to use Dyspnea scale to guide intensity level when exercising independently     Knowledge and understanding of Target Heart Rate Range (THRR) Yes -- Yes     Intervention Provide education and explanation of THRR including how the numbers were predicted and where they are located for reference -- Provide education and explanation of THRR including how the numbers were predicted and where they are located for reference     Expected Outcomes Short Term: Able to state/look up THRR;Long Term: Able to use THRR to govern intensity when exercising independently;Short Term: Able to use daily as guideline for intensity in rehab -- Short Term: Able to state/look up THRR;Long Term: Able to use THRR to govern  intensity when exercising independently;Short Term: Able to use daily as guideline for intensity in rehab     Understanding of Exercise Prescription Yes -- Yes     Intervention Provide education, explanation, and written materials on patient's individual exercise prescription -- Provide education, explanation, and written materials on patient's individual exercise prescription     Expected Outcomes Short Term: Able to explain program exercise prescription;Long Term: Able to explain home exercise prescription to exercise independently -- Short Term: Able to explain program exercise prescription;Long Term: Able to explain home exercise prescription to exercise independently              Exercise Goals Re-Evaluation :  Exercise Goals Re-Evaluation     Row Name 11/18/20 0854 12/12/20 0902           Exercise Goal Re-Evaluation   Exercise Goals Review Increase Physical Activity;Increase Strength and Stamina;Able to understand and use rate of perceived exertion (RPE) scale;Able to understand and use Dyspnea scale;Knowledge and understanding of Target Heart  Rate Range (THRR);Understanding of Exercise Prescription Increase Physical Activity;Increase Strength and Stamina;Able to understand and use rate of perceived exertion (RPE) scale;Able to understand and use Dyspnea scale;Knowledge and understanding of Target Heart Rate Range (THRR);Understanding of Exercise Prescription      Comments Pt is scheduled to begin exercise this week. Will continue to monitor and make adjustments as necessary. Jaquarius has completed 8 exercise sessions. He tolerates exercise fair and has not progressed because of his chronic neck pain and SOB. Dsean is currently averaging 1.6-1.8 METs at level 2 on the Nustep and 23 rpm at level 1 on the arm ergometer. The goal is to increase Seith's workload. He tolerates the warm up and cooldown well as both are performed standing. Will continue to monitor and progress as able.      Expected Outcomes Through exercise at rehab and home, the patient will decrease shortness of breath with daily activities and feel confident in carrying out an exercise regimn at home. Through exercise at rehab and home, the patient will decrease shortness of breath with daily activities and feel confident in carrying out an exercise regimn at home.               Discharge Exercise Prescription (Final Exercise Prescription Changes):  Exercise Prescription Changes - 12/11/20 1100       Home Exercise Plan   Plans to continue exercise at Home (comment)   Patient has bike at home   Frequency Add 1 additional day to program exercise sessions.   Patient already meets exercise guidelines   Initial Home Exercises Provided 12/11/20             Nutrition:  Target Goals: Understanding of nutrition guidelines, daily intake of sodium <1569m, cholesterol <2044m calories 30% from fat and 7% or less from saturated fats, daily to have 5 or more servings of fruits and vegetables.  Biometrics:  Pre Biometrics - 11/10/20 1154       Pre Biometrics   Grip Strength 23  kg              Nutrition Therapy Plan and Nutrition Goals:  Nutrition Therapy & Goals - 11/28/20 1405       Nutrition Therapy   Diet TLC    Drug/Food Interactions Coumadin/Vit K      Personal Nutrition Goals   Nutrition Goal Pt to identify food quantities necessary to achieve weight gain of 0.5-1 lb per week at graduation from pulmonary rehab  Personal Goal #2 Pt to build a healthy plate including vegetables, fruits, whole grains, and low-fat dairy products in a heart healthy meal plan.    Personal Goal #3 Pt to add healthy fats to all meals and snacks to incorporate more calories.      Intervention Plan   Intervention Prescribe, educate and counsel regarding individualized specific dietary modifications aiming towards targeted core components such as weight, hypertension, lipid management, diabetes, heart failure and other comorbidities.;Nutrition handout(s) given to patient.    Expected Outcomes Short Term Goal: A plan has been developed with personal nutrition goals set during dietitian appointment.;Long Term Goal: Adherence to prescribed nutrition plan.             Nutrition Assessments:  MEDIFICTS Score Key: ?70 Need to make dietary changes  40-70 Heart Healthy Diet ? 40 Therapeutic Level Cholesterol Diet  Flowsheet Row PULMONARY REHAB OTHER RESPIRATORY from 11/25/2020 in [facility]  Picture Your Plate Total Score on Admission 55      Picture Your Plate Scores: <73 Unhealthy dietary pattern with much room for improvement. 41-50 Dietary pattern unlikely to meet recommendations for good health and room for improvement. 51-60 More healthful dietary pattern, with some room for improvement.  >60 Healthy dietary pattern, although there may be some specific behaviors that could be improved.    Nutrition Goals Re-Evaluation:  Nutrition Goals Re-Evaluation     [place] Name 11/28/20 1407             Goals   Current Weight 125 lb  (56.7 kg)       Nutrition Goal Pt to identify food quantities necessary to achieve weight gain of 0.5-1 lb per week at graduation from pulmonary rehab               Personal Goal #2 Re-Evaluation   Personal Goal #2 Pt to build a healthy plate including vegetables, fruits, whole grains, and low-fat dairy products in a heart healthy meal plan.               Personal Goal #3 Re-Evaluation   Personal Goal #3 Pt to add healthy fats to all meals and snacks to incorporate more calories.                Nutrition Goals Discharge (Final Nutrition Goals Re-Evaluation):  Nutrition Goals Re-Evaluation - 11/28/20 1407       Goals   Current Weight 125 lb (56.7 kg)    Nutrition Goal Pt to identify food quantities necessary to achieve weight gain of 0.5-1 lb per week at graduation from pulmonary rehab      Personal Goal #2 Re-Evaluation   Personal Goal #2 Pt to build a healthy plate including vegetables, fruits, whole grains, and low-fat dairy products in a heart healthy meal plan.      Personal Goal #3 Re-Evaluation   Personal Goal #3 Pt to add healthy fats to all meals and snacks to incorporate more calories.             Psychosocial: Target Goals: Acknowledge presence or absence of significant depression and/or stress, maximize coping skills, provide positive support system. Participant is able to verbalize types and ability to use techniques and skills needed for reducing stress and depression.  Initial Review & Psychosocial Screening:  Initial Psych Review & Screening - 11/10/20 1055       Initial Review   Current issues with Current Sleep Concerns      Family Dynamics  Good Support System? Yes   Son and family     Barriers   Psychosocial barriers to participate in program The patient should benefit from training in stress management and relaxation.      Screening Interventions   Interventions Encouraged to exercise    Expected Outcomes Long Term Goal: Stressors or current  issues are controlled or eliminated.;Long Term goal: The participant improves quality of Life and PHQ9 Scores as seen by post scores and/or verbalization of changes             Quality of Life Scores:  Scores of 19 and below usually indicate a poorer quality of life in these areas.  A difference of  2-3 points is a clinically meaningful difference.  A difference of 2-3 points in the total score of the Quality of Life Index has been associated with significant improvement in overall quality of life, self-image, physical symptoms, and general health in studies assessing change in quality of life.  PHQ-9: Recent Review Flowsheet Data     Depression screen [facility] 2/9 11/10/2020 09/03/2020 08/07/2020 06/16/2020 01/25/2019   Decreased Interest 0 0 1 1 0   Down, Depressed, Hopeless 1 0 0 0 0   PHQ - 2 Score 1 0 1 1 0   Altered sleeping 1 1 - 2 -   Tired, decreased energy 2 1 - 2 -   Change in appetite 2 2 - 1 -   Feeling bad or failure about yourself  0 0 - 0 -   Trouble concentrating 0 0 - 0 -   Moving slowly or fidgety/restless 0 0 - 0 -   Suicidal thoughts 0 0 - 0 -   PHQ-9 Score - 4 - 6 -   Difficult doing work/chores Somewhat difficult - - Somewhat difficult -      Interpretation of Total Score  Total Score Depression Severity:  1-4 = Minimal depression, 5-9 = Mild depression, 10-14 = Moderate depression, 15-19 = Moderately severe depression, 20-27 = Severe depression   Psychosocial Evaluation and Intervention:  Psychosocial Evaluation - 11/10/20 1518       Psychosocial Evaluation & Interventions   Interventions Encouraged to exercise with the program and follow exercise prescription    Comments Pt states he does feel down sometimes but does not feel like he is depressed. His PHQ9 score indicates minor depression. He lives alone and does have a son that lives local.    Expected Outcomes For pt to improve quality of life throughout pulmonary rehab shown by a decrease in PHQ9 scores     Continue Psychosocial Services  Follow up required by staff             Psychosocial Re-Evaluation:  Psychosocial Re-Evaluation     [place] Name 11/17/20 1134 12/15/20 1031           Psychosocial Re-Evaluation   Current issues with Current Sleep Concerns Current Sleep Concerns      Comments No change in psychosocial concerns since orientation/walk test in pulmonary rehab.  He will begin exercising 11/18/2020 in pulmonary rehab, will continue to evaluate concerns on a monthly basis. Tan is very quiet and not forthcoming with any psychosocial concerns.  He did voice one day recently that something personal was going on with him, but he did not want to talk about it. No change in sleeping patterns.      Expected Outcomes -- For [place] to handle his stress in healthy ways      Interventions --  Stress management education;Encouraged to attend Pulmonary Rehabilitation for the exercise      Continue Psychosocial Services  -- Follow up required by staff               Psychosocial Discharge (Final Psychosocial Re-Evaluation):  Psychosocial Re-Evaluation - 12/15/20 1031       Psychosocial Re-Evaluation   Current issues with Current Sleep Concerns    Comments Benjimin is very quiet and not forthcoming with any psychosocial concerns.  He did voice one day recently that something personal was going on with him, but he did not want to talk about it. No change in sleeping patterns.    Expected Outcomes For Alexus to handle his stress in healthy ways    Interventions Stress management education;Encouraged to attend Pulmonary Rehabilitation for the exercise    Continue Psychosocial Services  Follow up required by staff             Education: Education Goals: Education classes will be provided on a weekly basis, covering required topics. Participant will state understanding/return demonstration of topics presented.  Learning Barriers/Preferences:  Learning Barriers/Preferences - 11/10/20 1057        Learning Barriers/Preferences   Learning Barriers Sight   wears reading glasses   Learning Preferences Written Material;Skilled Demonstration;Individual Instruction;Verbal Instruction             Education Topics: Risk Factor Reduction:  -Group instruction that is supported by a PowerPoint presentation. Instructor discusses the definition of a risk factor, different risk factors for pulmonary disease, and how the heart and lungs work together.   Flowsheet Row PULMONARY REHAB OTHER RESPIRATORY from 12/11/2020 in [place]  Date 12/11/20  Educator handout       Nutrition for Pulmonary Patient:  -Group instruction provided by PowerPoint slides, verbal discussion, and written materials to support subject matter. The instructor gives an explanation and review of healthy diet recommendations, which includes a discussion on weight management, recommendations for fruit and vegetable consumption, as well as protein, fluid, caffeine, fiber, sodium, sugar, and alcohol. Tips for eating when patients are short of breath are discussed.   Pursed Lip Breathing:  -Group instruction that is supported by demonstration and informational handouts. Instructor discusses the benefits of pursed lip and diaphragmatic breathing and detailed demonstration on how to preform both.     Oxygen Safety:  -Group instruction provided by PowerPoint, verbal discussion, and written material to support subject matter. There is an overview of "What is Oxygen" and "Why do we need it".  Instructor also reviews how to create a safe environment for oxygen use, the importance of using oxygen as prescribed, and the risks of noncompliance. There is a brief discussion on traveling with oxygen and resources the patient may utilize.   Oxygen Equipment:  -Group instruction provided by [facility] Staff utilizing handouts, written materials, and equipment demonstrations.   Signs and Symptoms:   -Group instruction provided by written material and verbal discussion to support subject matter. Warning signs and symptoms of infection, stroke, and heart attack are reviewed and when to call the physician/911 reinforced. Tips for preventing the spread of infection discussed.   Advanced Directives:  -Group instruction provided by verbal instruction and written material to support subject matter. Instructor reviews Advanced Directive laws and proper instruction for filling out document.   Pulmonary Video:  -Group video education that reviews the importance of medication and oxygen compliance, exercise, good nutrition, pulmonary hygiene, and pursed lip and diaphragmatic breathing for  the pulmonary patient.   Exercise for the Pulmonary Patient:  -Group instruction that is supported by a PowerPoint presentation. Instructor discusses benefits of exercise, core components of exercise, frequency, duration, and intensity of an exercise routine, importance of utilizing pulse oximetry during exercise, safety while exercising, and options of places to exercise outside of rehab.     Pulmonary Medications:  -Verbally interactive group education provided by instructor with focus on inhaled medications and proper administration.   Anatomy and Physiology of the Respiratory System and Intimacy:  -Group instruction provided by PowerPoint, verbal discussion, and written material to support subject matter. Instructor reviews respiratory cycle and anatomical components of the respiratory system and their functions. Instructor also reviews differences in obstructive and restrictive respiratory diseases with examples of each. Intimacy, Sex, and Sexuality differences are reviewed with a discussion on how relationships can change when diagnosed with pulmonary disease. Common sexual concerns are reviewed.   MD DAY -A group question and answer session with a medical doctor that allows participants to ask questions  that relate to their pulmonary disease state.   OTHER EDUCATION -Group or individual verbal, written, or video instructions that support the educational goals of the pulmonary rehab program. [place] from 12/11/2020 in [place]  Date 12/04/20  Educator handout  [Beat a Sedentary Lifestyle]       Holiday Eating Survival Tips:  -Group instruction provided by PowerPoint slides, verbal discussion, and written materials to support subject matter. The instructor gives patients tips, tricks, and techniques to help them not only survive but enjoy the holidays despite the onslaught of food that accompanies the holidays.   Knowledge Questionnaire Score:  Knowledge Questionnaire Score - 11/10/20 1542       Knowledge Questionnaire Score   Pre Score 13/18             Core Components/Risk Factors/Patient Goals at Admission:  Personal Goals and Risk Factors at Admission - 11/17/20 1149       Core Components/Risk Factors/Patient Goals on Admission   Heart Failure Yes    Intervention Provide a combined exercise and nutrition program that is supplemented with education, support and counseling about heart failure. Directed toward relieving symptoms such as shortness of breath, decreased exercise tolerance, and extremity edema.    Expected Outcomes Improve functional capacity of life;Short term: Attendance in program 2-3 days a week with increased exercise capacity. Reported lower sodium intake. Reported increased fruit and vegetable intake. Reports medication compliance.;Short term: Daily weights obtained and reported for increase. Utilizing diuretic protocols set by physician.;Long term: Adoption of self-care skills and reduction of barriers for early signs and symptoms recognition and intervention leading to self-care maintenance.             Core Components/Risk Factors/Patient Goals Review:   Goals and Risk Factor  Review     Row Name 11/17/20 1150 12/15/20 1034           Core Components/Risk Factors/Patient Goals Review   Personal Goals Review Heart Failure Heart Failure      Review [place]awn will begin exercising on 11/18/2020, which is tomorrow, will hopefully see progression towards goals in the next 30 days. Sherwin's heart failure is stable at this time, he is working on gaining weight, not fluid overload.  He has gained 1kg.  He is slow to progress with his workloads on the equipment.      Expected Outcomes See admission goals. See admission goals.  Core Components/Risk Factors/Patient Goals at Discharge (Final Review):   Goals and Risk Factor Review - 12/15/20 1034       Core Components/Risk Factors/Patient Goals Review   Personal Goals Review Heart Failure    Review Lilton's heart failure is stable at this time, he is working on gaining weight, not fluid overload.  He has gained 1kg.  He is slow to progress with his workloads on the equipment.    Expected Outcomes See admission goals.             ITP Comments:   Comments:

## 2020-12-17 ENCOUNTER — Other Ambulatory Visit: Payer: Self-pay

## 2020-12-17 ENCOUNTER — Ambulatory Visit (HOSPITAL_COMMUNITY)
Admission: RE | Admit: 2020-12-17 | Discharge: 2020-12-17 | Disposition: A | Discharge status: Home / Self Care | Payer: Medicare HMO | Source: Ambulatory Visit | Attending: Cardiology | Admitting: Cardiology

## 2020-12-17 ENCOUNTER — Ambulatory Visit: Payer: Medicare HMO | Admitting: *Deleted

## 2020-12-17 DIAGNOSIS — I059 Rheumatic mitral valve disease, unspecified: Secondary | ICD-10-CM | POA: Diagnosis not present

## 2020-12-17 DIAGNOSIS — I5032 Chronic diastolic (congestive) heart failure: Secondary | ICD-10-CM | POA: Diagnosis not present

## 2020-12-17 DIAGNOSIS — Z5181 Encounter for therapeutic drug level monitoring: Secondary | ICD-10-CM | POA: Diagnosis not present

## 2020-12-17 DIAGNOSIS — I4892 Unspecified atrial flutter: Secondary | ICD-10-CM

## 2020-12-17 LAB — BASIC METABOLIC PANEL
Anion gap: 9 (ref 5–15)
BUN: 40 mg/dL — ABNORMAL HIGH (ref 8–23)
CO2: 27 mmol/L (ref 22–32)
Calcium: 9.3 mg/dL (ref 8.9–10.3)
Chloride: 100 mmol/L (ref 98–111)
Creatinine, Ser: 2.53 mg/dL — ABNORMAL HIGH (ref 0.61–1.24)
GFR, Estimated: 27 mL/min — ABNORMAL LOW (ref >60)
Glucose, Bld: 108 mg/dL — ABNORMAL HIGH (ref 70–99)
Potassium: 3.4 mmol/L — ABNORMAL LOW (ref 3.5–5.1)
Sodium: 136 mmol/L (ref 135–145)

## 2020-12-17 LAB — POCT INR: INR: 2.4 (ref 2.0–3.0)

## 2020-12-17 NOTE — Patient Instructions (Signed)
Description   Today take 1.5 tablets and then START taking Warfarin 1 tablet daily except for 1/2 tablet on Thursdays. Recheck INR in 3 weeks. [facility] [phone].

## 2020-12-18 ENCOUNTER — Encounter (HOSPITAL_COMMUNITY): Payer: Medicare HMO

## 2020-12-22 NOTE — Progress Notes (Signed)
Pulmonary Individual Treatment Plan  Patient Details  Name: [name] MRN: [id number] Date of Birth: 02/11/1951 Referring Provider:   April Manson Pulmonary Rehab Walk Test from 11/10/2020 in [place]  Referring Provider Dr. Haroldine Laws       Initial Encounter Date:  Flowsheet Row Pulmonary Rehab Walk Test from 11/10/2020 in [place]  Date 11/10/20       Visit Diagnosis: Chronic diastolic heart failure ([place])  Patient's Home Medications on Admission:   Current Outpatient Medications:    amoxicillin (AMOXIL) 500 MG capsule, TAKE 4 CAPSULES BY MOUTH 1 HOUR BEFORE DENTAL APPOINTMENT, Disp: 4 capsule, Rfl: 1   Efinaconazole 10 % SOLN, Apply to toenail daily for 48 weeks, Disp: 8 mL, Rfl: 3   enoxaparin (LOVENOX) 60 MG/0.6ML injection, Inject 0.6 mLs (60 mg total) into the skin daily., Disp: 6 mL, Rfl: 1   ferrous sulfate 325 (65 FE) MG tablet, Take 1 tablet (325 mg total) by mouth every other day. In the evening. (Patient taking differently: Take 325 mg by mouth every other day. In the evening.), Disp: , Rfl: 3   furosemide (LASIX) 40 MG tablet, Take 1 tablet (40 mg total) by mouth daily. TAKE 1 TABLET(40 MG) BY MOUTH DAILY, Disp: 90 tablet, Rfl: 3   metolazone (ZAROXOLYN) 2.5 MG tablet, TAKE AS DIRECTED BY THE HEART FAILURE CLINIC, Disp: 30 tablet, Rfl: 2   polyethylene glycol (MIRALAX) 17 g packet, Take 17 g by mouth daily as needed., Disp: 14 each, Rfl: 0   potassium chloride SA (KLOR-CON) 20 MEQ tablet, Take 1 tablet (20 mEq total) by mouth daily., Disp: 90 tablet, Rfl: 3   sildenafil (REVATIO) 20 MG tablet, Take 20 mg by mouth daily as needed (ED)., Disp: , Rfl:    spironolactone (ALDACTONE) 25 MG tablet, Take 1 tablet (25 mg total) by mouth daily., Disp: 90 tablet, Rfl: 3   Testosterone 1.62 % GEL, Apply 2 Pump topically daily. 1 pump on each arm, Disp: , Rfl:    warfarin (COUMADIN) 5 MG tablet, TAKE 1/2 TABLET BY  MOUTH ON THURSDAY AND SATURDAY, AND THEN 1 TABLET ON SUNDAY, MONDAY, TUESDAY, WEDNESDAY, AND FRIDAY, Disp: 77 tablet, Rfl: 1  Past Medical History: Past Medical History:  Diagnosis Date   Allergy    Aneurysm of ascending aorta (HCC)    Atrial fibrillation (HCC)    BPH (benign prostatic hyperplasia)    Colon polyps    FHx: rheumatic heart disease    H/O diplopia    Hepatitis C    Hypertension    Hypogonadism male    Mitral valve disease    mitral valve repair/replacement x3   OSA (obstructive sleep apnea) 12/15/2020    Tobacco Use: Social History   Tobacco Use  Smoking Status Former   Types: Cigars   Quit date: 09/03/2017   Years since quitting: 3.3  Smokeless Tobacco Never    Labs: Recent Review Flowsheet Data     Labs for ITP Cardiac and Pulmonary Rehab Latest Ref Rng & Units 10/23/2019 11/26/2019 05/07/2020 05/07/2020 05/07/2020   PHART 7.350 - 7.450 - - 7.160(LL) 7.272(L) -   PCO2ART 32.0 - 48.0 mmHg - - 68.8(HH) 57.3(H) -   HCO3 20.0 - 28.0 mmol/L 23.6 - 23.5 25.6 23.0   TCO2 22 - 32 mmol/L 25 25 - - -   ACIDBASEDEF 0.0 - 2.0 mmol/L 2.0 - 5.4(H) 1.1 2.8(H)   O2SAT % 54.0 - 44.8 26.9 49.2  Capillary Blood Glucose: Lab Results  Component Value Date   GLUCAP 140 (H) 05/07/2020     Pulmonary Assessment Scores:  Pulmonary Assessment Scores     Row Name 11/10/20 1544         ADL UCSD   ADL Phase Entry     SOB Score total 41           CAT Score   CAT Score 13           mMRC Score   mMRC Score 3             UCSD: Self-administered rating of dyspnea associated with activities of daily living (ADLs) 6-point scale (0 = "not at all" to 5 = "maximal or unable to do because of breathlessness")  Scoring Scores range from 0 to 120.  Minimally important difference is 5 units  CAT: CAT can identify the health impairment of COPD patients and is better correlated with disease progression.  CAT has a scoring range of zero to 40. The CAT score is classified  into four groups of low (less than 10), medium (10 - 20), high (21-30) and very high (31-40) based on the impact level of disease on health status. A CAT score over 10 suggests significant symptoms.  A worsening CAT score could be explained by an exacerbation, poor medication adherence, poor inhaler technique, or progression of COPD or comorbid conditions.  CAT MCID is 2 points  mMRC: mMRC (Modified Medical Research Council) Dyspnea Scale is used to assess the degree of baseline functional disability in patients of respiratory disease due to dyspnea. No minimal important difference is established. A decrease in score of 1 point or greater is considered a positive change.   Pulmonary Function Assessment:  Pulmonary Function Assessment - 11/10/20 1055       Breath   Shortness of Breath Yes;Limiting activity;Fear of Shortness of Breath             Exercise Target Goals: Exercise Program Goal: Individual exercise prescription set using results from initial 6 min walk test and THRR while considering  patient's activity barriers and safety.   Exercise Prescription Goal: Initial exercise prescription builds to 30-45 minutes a day of aerobic activity, 2-3 days per week.  Home exercise guidelines will be given to patient during program as part of exercise prescription that the participant will acknowledge.  Activity Barriers & Risk Stratification:  Activity Barriers & Cardiac Risk Stratification - 11/10/20 1052       Activity Barriers & Cardiac Risk Stratification   Activity Barriers Shortness of Breath;Neck/Spine Problems;Deconditioning             6 Minute Walk:  6 Minute Walk     Row Name 11/10/20 1529         6 Minute Walk   Phase Initial     Distance 1011 feet     Walk Time 6 minutes     # of Rest Breaks 0     MPH 1.91     METS 3.33     RPE 13     Perceived Dyspnea  3     Symptoms Yes (comment)     Comments Pt stopped walk test at 5:30 due to SOB, stated he could  not go any further     Resting HR 71 bpm     Resting BP 85/50     Resting Oxygen Saturation  98 %     Exercise Oxygen Saturation  during 6 min  walk 94 %     Max Ex. HR 115 bpm     Max Ex. BP 126/64     2 Minute Post BP 120/64           Interval HR   1 Minute HR 87     2 Minute HR 96     3 Minute HR 105     4 Minute HR 110     5 Minute HR 115     6 Minute HR 110     2 Minute Post HR 82     Interval Heart Rate? Yes           Interval Oxygen   Interval Oxygen? Yes     Baseline Oxygen Saturation % 98 %     1 Minute Oxygen Saturation % 98 %     1 Minute Liters of Oxygen 0 L     2 Minute Oxygen Saturation % 96 %     2 Minute Liters of Oxygen 0 L     3 Minute Oxygen Saturation % 95 %     3 Minute Liters of Oxygen 0 L     4 Minute Oxygen Saturation % 94 %     4 Minute Liters of Oxygen 0 L     5 Minute Oxygen Saturation % 94 %     5 Minute Liters of Oxygen 0 L     6 Minute Oxygen Saturation % 95 %     6 Minute Liters of Oxygen 0 L     2 Minute Post Oxygen Saturation % 98 %     2 Minute Post Liters of Oxygen 0 L              Oxygen Initial Assessment:  Oxygen Initial Assessment - 11/10/20 1054       Home Oxygen   Home Oxygen Device None    Sleep Oxygen Prescription None    Home Exercise Oxygen Prescription None    Home Resting Oxygen Prescription None      Initial 6 min Walk   Oxygen Used None      Program Oxygen Prescription   Program Oxygen Prescription None      Intervention   Short Term Goals To learn and understand importance of monitoring SPO2 with pulse oximeter and demonstrate accurate use of the pulse oximeter.;To learn and understand importance of maintaining oxygen saturations>88%;To learn and demonstrate proper pursed lip breathing techniques or other breathing techniques. ;To learn and demonstrate proper use of respiratory medications    Long  Term Goals Verbalizes importance of monitoring SPO2 with pulse oximeter and return demonstration;Maintenance  of O2 saturations>88%;Exhibits proper breathing techniques, such as pursed lip breathing or other method taught during program session;Compliance with respiratory medication;Demonstrates proper use of MDI's             Oxygen Re-Evaluation:  Oxygen Re-Evaluation     Row Name 12/12/20 0859             Program Oxygen Prescription   Program Oxygen Prescription None               Home Oxygen   Home Oxygen Device None       Sleep Oxygen Prescription None       Home Exercise Oxygen Prescription None       Home Resting Oxygen Prescription None               Goals/Expected Outcomes   Short Term Goals To  learn and understand importance of monitoring SPO2 with pulse oximeter and demonstrate accurate use of the pulse oximeter.;To learn and understand importance of maintaining oxygen saturations>88%;To learn and demonstrate proper pursed lip breathing techniques or other breathing techniques. ;To learn and demonstrate proper use of respiratory medications       Long  Term Goals Verbalizes importance of monitoring SPO2 with pulse oximeter and return demonstration;Maintenance of O2 saturations>88%;Exhibits proper breathing techniques, such as pursed lip breathing or other method taught during program session;Compliance with respiratory medication;Demonstrates proper use of MDI's       Goals/Expected Outcomes Compliance and understanding of monitoring oxygen saturation and importance of pursed lip breathing                Oxygen Discharge (Final Oxygen Re-Evaluation):  Oxygen Re-Evaluation - 12/12/20 0859       Program Oxygen Prescription   Program Oxygen Prescription None      Home Oxygen   Home Oxygen Device None    Sleep Oxygen Prescription None    Home Exercise Oxygen Prescription None    Home Resting Oxygen Prescription None      Goals/Expected Outcomes   Short Term Goals To learn and understand importance of monitoring SPO2 with pulse oximeter and demonstrate accurate use  of the pulse oximeter.;To learn and understand importance of maintaining oxygen saturations>88%;To learn and demonstrate proper pursed lip breathing techniques or other breathing techniques. ;To learn and demonstrate proper use of respiratory medications    Long  Term Goals Verbalizes importance of monitoring SPO2 with pulse oximeter and return demonstration;Maintenance of O2 saturations>88%;Exhibits proper breathing techniques, such as pursed lip breathing or other method taught during program session;Compliance with respiratory medication;Demonstrates proper use of MDI's    Goals/Expected Outcomes Compliance and understanding of monitoring oxygen saturation and importance of pursed lip breathing             Initial Exercise Prescription:  Initial Exercise Prescription - 11/10/20 1500       Date of Initial Exercise RX and Referring Provider   Date 11/10/20    Referring Provider Dr. Haroldine Laws    Expected Discharge Date 01/15/21      NuStep   Level 2    SPM 80    Minutes 15      Arm Ergometer   Level 1.5    RPM 40    Minutes 15      Prescription Details   Frequency (times per week) 2    Duration Progress to 30 minutes of continuous aerobic without signs/symptoms of physical distress      Intensity   THRR 40-80% of Max Heartrate 61-122    Ratings of Perceived Exertion 11-13    Perceived Dyspnea 0-4      Progression   Progression Continue to progress workloads to maintain intensity without signs/symptoms of physical distress.      Resistance Training   Training Prescription Yes    Weight red bands    Reps 10-15             Perform Capillary Blood Glucose checks as needed.  Exercise Prescription Changes:   Exercise Prescription Changes     Row Name 11/25/20 1200 12/09/20 1100 12/11/20 1100         Response to Exercise   Blood Pressure (Admit) 104/50 110/70 --     Blood Pressure (Exercise) 110/58 100/68 --     Blood Pressure (Exit) 98/60 110/60 --      Heart Rate (Admit) 80 bpm 85  bpm --     Heart Rate (Exercise) 78 bpm 102 bpm --     Heart Rate (Exit) 76 bpm 65 bpm --     Oxygen Saturation (Admit) 100 % 100 % --     Oxygen Saturation (Exercise) 98 % 98 % --     Oxygen Saturation (Exit) 100 % 100 % --     Rating of Perceived Exertion (Exercise) 12 13 --     Perceived Dyspnea (Exercise) 1 1 --     Duration Progress to 30 minutes of  aerobic without signs/symptoms of physical distress Progress to 30 minutes of  aerobic without signs/symptoms of physical distress --     Intensity Other (comment)  40-80% of HRR THRR unchanged --           Progression   Progression Continue to progress workloads to maintain intensity without signs/symptoms of physical distress. Continue to progress workloads to maintain intensity without signs/symptoms of physical distress. --           Horticulturist, commercial Prescription Yes Yes --     Weight Red Bands Red Bands --     Reps 10-15 10-15 --     Time 10 Minutes 10 Minutes --           NuStep   Level 2 2 --     SPM 80 60 --     Minutes 15 15 --     METs -- 1.5 --           Arm Ergometer   Level 1 1 --     RPM 23 25 --     Minutes 15 15 --           Home Exercise Plan   Plans to continue exercise at -- -- Home (comment)  Patient has bike at home     Frequency -- -- Add 1 additional day to program exercise sessions.  Patient already meets exercise guidelines     Initial Home Exercises Provided -- -- 12/11/20              Exercise Comments:   Exercise Comments     Row Name 11/18/20 1435 12/11/20 1138         Exercise Comments Patient completed first day of exercise and tolerated well with no complaints or concerns. He was able to do 15 minutes on the Nustep and 15 minutes on the arm ergometer. He also tolerated the warm up/cool down and resistance training with no issues. Completed home exercise plan. Pt is currently biking at home for 2x15-20 min on five non-rehab days/wk. Pt  exercises at a fairly light-somewhat hard intensity. Pt also performs resistance band exercises for 10 reps. Encourage pt to continue current exercise plan. Recommended increasing number of reps for resistance band exercises.               Exercise Goals and Review:   Exercise Goals     Row Name 11/10/20 1155 11/18/20 0853 12/12/20 0901         Exercise Goals   Increase Physical Activity Yes -- Yes     Intervention Provide advice, education, support and counseling about physical activity/exercise needs.;Develop an individualized exercise prescription for aerobic and resistive training based on initial evaluation findings, risk stratification, comorbidities and participant's personal goals. -- Provide advice, education, support and counseling about physical activity/exercise needs.;Develop an individualized exercise prescription for aerobic and resistive training based on initial evaluation findings, risk stratification,  comorbidities and participant's personal goals.     Expected Outcomes Short Term: Attend rehab on a regular basis to increase amount of physical activity.;Long Term: Add in home exercise to make exercise part of routine and to increase amount of physical activity.;Long Term: Exercising regularly at least 3-5 days a week. -- Short Term: Attend rehab on a regular basis to increase amount of physical activity.;Long Term: Add in home exercise to make exercise part of routine and to increase amount of physical activity.;Long Term: Exercising regularly at least 3-5 days a week.     Increase Strength and Stamina Yes -- Yes     Intervention Provide advice, education, support and counseling about physical activity/exercise needs.;Develop an individualized exercise prescription for aerobic and resistive training based on initial evaluation findings, risk stratification, comorbidities and participant's personal goals. -- Provide advice, education, support and counseling about physical  activity/exercise needs.;Develop an individualized exercise prescription for aerobic and resistive training based on initial evaluation findings, risk stratification, comorbidities and participant's personal goals.     Expected Outcomes Short Term: Increase workloads from initial exercise prescription for resistance, speed, and METs.;Short Term: Perform resistance training exercises routinely during rehab and add in resistance training at home;Long Term: Improve cardiorespiratory fitness, muscular endurance and strength as measured by increased METs and functional capacity (6MWT) -- Short Term: Increase workloads from initial exercise prescription for resistance, speed, and METs.;Short Term: Perform resistance training exercises routinely during rehab and add in resistance training at home;Long Term: Improve cardiorespiratory fitness, muscular endurance and strength as measured by increased METs and functional capacity (6MWT)     Able to understand and use rate of perceived exertion (RPE) scale Yes -- Yes     Intervention Provide education and explanation on how to use RPE scale -- Provide education and explanation on how to use RPE scale     Expected Outcomes Short Term: Able to use RPE daily in rehab to express subjective intensity level;Long Term:  Able to use RPE to guide intensity level when exercising independently -- Short Term: Able to use RPE daily in rehab to express subjective intensity level;Long Term:  Able to use RPE to guide intensity level when exercising independently     Able to understand and use Dyspnea scale Yes -- Yes     Intervention Provide education and explanation on how to use Dyspnea scale -- Provide education and explanation on how to use Dyspnea scale     Expected Outcomes Short Term: Able to use Dyspnea scale daily in rehab to express subjective sense of shortness of breath during exertion;Long Term: Able to use Dyspnea scale to guide intensity level when exercising  independently -- Short Term: Able to use Dyspnea scale daily in rehab to express subjective sense of shortness of breath during exertion;Long Term: Able to use Dyspnea scale to guide intensity level when exercising independently     Knowledge and understanding of Target Heart Rate Range (THRR) Yes -- Yes     Intervention Provide education and explanation of THRR including how the numbers were predicted and where they are located for reference -- Provide education and explanation of THRR including how the numbers were predicted and where they are located for reference     Expected Outcomes Short Term: Able to state/look up THRR;Long Term: Able to use THRR to govern intensity when exercising independently;Short Term: Able to use daily as guideline for intensity in rehab -- Short Term: Able to state/look up THRR;Long Term: Able to use THRR to govern  intensity when exercising independently;Short Term: Able to use daily as guideline for intensity in rehab     Understanding of Exercise Prescription Yes -- Yes     Intervention Provide education, explanation, and written materials on patient's individual exercise prescription -- Provide education, explanation, and written materials on patient's individual exercise prescription     Expected Outcomes Short Term: Able to explain program exercise prescription;Long Term: Able to explain home exercise prescription to exercise independently -- Short Term: Able to explain program exercise prescription;Long Term: Able to explain home exercise prescription to exercise independently              Exercise Goals Re-Evaluation :  Exercise Goals Re-Evaluation     Row Name 11/18/20 0854 12/12/20 0902           Exercise Goal Re-Evaluation   Exercise Goals Review Increase Physical Activity;Increase Strength and Stamina;Able to understand and use rate of perceived exertion (RPE) scale;Able to understand and use Dyspnea scale;Knowledge and understanding of Target Heart  Rate Range (THRR);Understanding of Exercise Prescription Increase Physical Activity;Increase Strength and Stamina;Able to understand and use rate of perceived exertion (RPE) scale;Able to understand and use Dyspnea scale;Knowledge and understanding of Target Heart Rate Range (THRR);Understanding of Exercise Prescription      Comments Pt is scheduled to begin exercise this week. Will continue to monitor and make adjustments as necessary. Rey has completed 8 exercise sessions. He tolerates exercise fair and has not progressed because of his chronic neck pain and SOB. Hawkin is currently averaging 1.6-1.8 METs at level 2 on the Nustep and 23 rpm at level 1 on the arm ergometer. The goal is to increase Markian's workload. He tolerates the warm up and cooldown well as both are performed standing. Will continue to monitor and progress as able.      Expected Outcomes Through exercise at rehab and home, the patient will decrease shortness of breath with daily activities and feel confident in carrying out an exercise regimn at home. Through exercise at rehab and home, the patient will decrease shortness of breath with daily activities and feel confident in carrying out an exercise regimn at home.               Discharge Exercise Prescription (Final Exercise Prescription Changes):  Exercise Prescription Changes - 12/11/20 1100       Home Exercise Plan   Plans to continue exercise at Home (comment)   Patient has bike at home   Frequency Add 1 additional day to program exercise sessions.   Patient already meets exercise guidelines   Initial Home Exercises Provided 12/11/20             Nutrition:  Target Goals: Understanding of nutrition guidelines, daily intake of sodium <1569m, cholesterol <2044m calories 30% from fat and 7% or less from saturated fats, daily to have 5 or more servings of fruits and vegetables.  Biometrics:  Pre Biometrics - 11/10/20 1154       Pre Biometrics   Grip Strength 23  kg              Nutrition Therapy Plan and Nutrition Goals:  Nutrition Therapy & Goals - 11/28/20 1405       Nutrition Therapy   Diet TLC    Drug/Food Interactions Coumadin/Vit K      Personal Nutrition Goals   Nutrition Goal Pt to identify food quantities necessary to achieve weight gain of 0.5-1 lb per week at graduation from pulmonary rehab  Personal Goal #2 Pt to build a healthy plate including vegetables, fruits, whole grains, and low-fat dairy products in a heart healthy meal plan.    Personal Goal #3 Pt to add healthy fats to all meals and snacks to incorporate more calories.      Intervention Plan   Intervention Prescribe, educate and counsel regarding individualized specific dietary modifications aiming towards targeted core components such as weight, hypertension, lipid management, diabetes, heart failure and other comorbidities.;Nutrition handout(s) given to patient.    Expected Outcomes Short Term Goal: A plan has been developed with personal nutrition goals set during dietitian appointment.;Long Term Goal: Adherence to prescribed nutrition plan.             Nutrition Assessments:  MEDIFICTS Score Key: ?70 Need to make dietary changes  40-70 Heart Healthy Diet ? 40 Therapeutic Level Cholesterol Diet  Flowsheet Row PULMONARY REHAB OTHER RESPIRATORY from 11/25/2020 in [place]  Picture Your Plate Total Score on Admission 55      Picture Your Plate Scores: <09 Unhealthy dietary pattern with much room for improvement. 41-50 Dietary pattern unlikely to meet recommendations for good health and room for improvement. 51-60 More healthful dietary pattern, with some room for improvement.  >60 Healthy dietary pattern, although there may be some specific behaviors that could be improved.    Nutrition Goals Re-Evaluation:  Nutrition Goals Re-Evaluation     [place] Name 11/28/20 1407 12/19/20 1416           Goals   Current  Weight 125 lb (56.7 kg) 129 lb 13.6 oz (58.9 kg)      Nutrition Goal Pt to identify food quantities necessary to achieve weight gain of 0.5-1 lb per week at graduation from pulmonary rehab Pt to identify food quantities necessary to achieve weight gain of 0.5-1 lb per week at graduation from pulmonary rehab             Personal Goal #2 Re-Evaluation   Personal Goal #2 Pt to build a healthy plate including vegetables, fruits, whole grains, and low-fat dairy products in a heart healthy meal plan. Pt to build a healthy plate including vegetables, fruits, whole grains, and low-fat dairy products in a heart healthy meal plan.             Personal Goal #3 Re-Evaluation   Personal Goal #3 Pt to add healthy fats to all meals and snacks to incorporate more calories. Pt to add healthy fats to all meals and snacks to incorporate more calories.               Nutrition Goals Discharge (Final Nutrition Goals Re-Evaluation):  Nutrition Goals Re-Evaluation - 12/19/20 1416       Goals   Current Weight 129 lb 13.6 oz (58.9 kg)    Nutrition Goal Pt to identify food quantities necessary to achieve weight gain of 0.5-1 lb per week at graduation from pulmonary rehab      Personal Goal #2 Re-Evaluation   Personal Goal #2 Pt to build a healthy plate including vegetables, fruits, whole grains, and low-fat dairy products in a heart healthy meal plan.      Personal Goal #3 Re-Evaluation   Personal Goal #3 Pt to add healthy fats to all meals and snacks to incorporate more calories.             Psychosocial: Target Goals: Acknowledge presence or absence of significant depression and/or stress, maximize coping skills, provide positive support system. Participant  is able to verbalize types and ability to use techniques and skills needed for reducing stress and depression.  Initial Review & Psychosocial Screening:  Initial Psych Review & Screening - 11/10/20 1055       Initial Review   Current issues  with Current Sleep Concerns      Family Dynamics   Good Support System? Yes   Son and family     Barriers   Psychosocial barriers to participate in program The patient should benefit from training in stress management and relaxation.      Screening Interventions   Interventions Encouraged to exercise    Expected Outcomes Long Term Goal: Stressors or current issues are controlled or eliminated.;Long Term goal: The participant improves quality of Life and PHQ9 Scores as seen by post scores and/or verbalization of changes             Quality of Life Scores:  Scores of 19 and below usually indicate a poorer quality of life in these areas.  A difference of  2-3 points is a clinically meaningful difference.  A difference of 2-3 points in the total score of the Quality of Life Index has been associated with significant improvement in overall quality of life, self-image, physical symptoms, and general health in studies assessing change in quality of life.  PHQ-9: Recent Review Flowsheet Data     Depression screen [facility] 2/9 11/10/2020 09/03/2020 08/07/2020 06/16/2020 01/25/2019   Decreased Interest 0 0 1 1 0   Down, Depressed, Hopeless 1 0 0 0 0   PHQ - 2 Score 1 0 1 1 0   Altered sleeping 1 1 - 2 -   Tired, decreased energy 2 1 - 2 -   Change in appetite 2 2 - 1 -   Feeling bad or failure about yourself  0 0 - 0 -   Trouble concentrating 0 0 - 0 -   Moving slowly or fidgety/restless 0 0 - 0 -   Suicidal thoughts 0 0 - 0 -   PHQ-9 Score - 4 - 6 -   Difficult doing work/chores Somewhat difficult - - Somewhat difficult -      Interpretation of Total Score  Total Score Depression Severity:  1-4 = Minimal depression, 5-9 = Mild depression, 10-14 = Moderate depression, 15-19 = Moderately severe depression, 20-27 = Severe depression   Psychosocial Evaluation and Intervention:  Psychosocial Evaluation - 11/10/20 1518       Psychosocial Evaluation & Interventions   Interventions Encouraged to  exercise with the program and follow exercise prescription    Comments Pt states he does feel down sometimes but does not feel like he is depressed. His PHQ9 score indicates minor depression. He lives alone and does have a son that lives local.    Expected Outcomes For pt to improve quality of life throughout pulmonary rehab shown by a decrease in PHQ9 scores    Continue Psychosocial Services  Follow up required by staff             Psychosocial Re-Evaluation:  Psychosocial Re-Evaluation     [place] Name 11/17/20 1134 12/15/20 1031           Psychosocial Re-Evaluation   Current issues with Current Sleep Concerns Current Sleep Concerns      Comments No change in psychosocial concerns since orientation/walk test in pulmonary rehab.  He will begin exercising 11/18/2020 in pulmonary rehab, will continue to evaluate concerns on a monthly basis. Antonino is very quiet and  not forthcoming with any psychosocial concerns.  He did voice one day recently that something personal was going on with him, but he did not want to talk about it. No change in sleeping patterns.      Expected Outcomes -- For Greysen to handle his stress in healthy ways      Interventions -- Stress management education;Encouraged to attend Pulmonary Rehabilitation for the exercise      Continue Psychosocial Services  -- Follow up required by staff               Psychosocial Discharge (Final Psychosocial Re-Evaluation):  Psychosocial Re-Evaluation - 12/15/20 1031       Psychosocial Re-Evaluation   Current issues with Current Sleep Concerns    Comments Arvis is very quiet and not forthcoming with any psychosocial concerns.  He did voice one day recently that something personal was going on with him, but he did not want to talk about it. No change in sleeping patterns.    Expected Outcomes For Devante to handle his stress in healthy ways    Interventions Stress management education;Encouraged to attend Pulmonary Rehabilitation for  the exercise    Continue Psychosocial Services  Follow up required by staff             Education: Education Goals: Education classes will be provided on a weekly basis, covering required topics. Participant will state understanding/return demonstration of topics presented.  Learning Barriers/Preferences:  Learning Barriers/Preferences - 11/10/20 1057       Learning Barriers/Preferences   Learning Barriers Sight   wears reading glasses   Learning Preferences Written Material;Skilled Demonstration;Individual Instruction;Verbal Instruction             Education Topics: Risk Factor Reduction:  -Group instruction that is supported by a PowerPoint presentation. Instructor discusses the definition of a risk factor, different risk factors for pulmonary disease, and how the heart and lungs work together.   Flowsheet Row PULMONARY REHAB OTHER RESPIRATORY from 12/11/2020 in [place]  Date 12/11/20  Educator handout       Nutrition for Pulmonary Patient:  -Group instruction provided by PowerPoint slides, verbal discussion, and written materials to support subject matter. The instructor gives an explanation and review of healthy diet recommendations, which includes a discussion on weight management, recommendations for fruit and vegetable consumption, as well as protein, fluid, caffeine, fiber, sodium, sugar, and alcohol. Tips for eating when patients are short of breath are discussed.   Pursed Lip Breathing:  -Group instruction that is supported by demonstration and informational handouts. Instructor discusses the benefits of pursed lip and diaphragmatic breathing and detailed demonstration on how to preform both.     Oxygen Safety:  -Group instruction provided by PowerPoint, verbal discussion, and written material to support subject matter. There is an overview of "What is Oxygen" and "Why do we need it".  Instructor also reviews how to create a safe  environment for oxygen use, the importance of using oxygen as prescribed, and the risks of noncompliance. There is a brief discussion on traveling with oxygen and resources the patient may utilize.   Oxygen Equipment:  -Group instruction provided by [facility] Staff utilizing handouts, written materials, and equipment demonstrations.   Signs and Symptoms:  -Group instruction provided by written material and verbal discussion to support subject matter. Warning signs and symptoms of infection, stroke, and heart attack are reviewed and when to call the physician/911 reinforced. Tips for preventing the spread of infection discussed.  Advanced Directives:  -Group instruction provided by verbal instruction and written material to support subject matter. Instructor reviews Advanced Directive laws and proper instruction for filling out document.   Pulmonary Video:  -Group video education that reviews the importance of medication and oxygen compliance, exercise, good nutrition, pulmonary hygiene, and pursed lip and diaphragmatic breathing for the pulmonary patient.   Exercise for the Pulmonary Patient:  -Group instruction that is supported by a PowerPoint presentation. Instructor discusses benefits of exercise, core components of exercise, frequency, duration, and intensity of an exercise routine, importance of utilizing pulse oximetry during exercise, safety while exercising, and options of places to exercise outside of rehab.     Pulmonary Medications:  -Verbally interactive group education provided by instructor with focus on inhaled medications and proper administration.   Anatomy and Physiology of the Respiratory System and Intimacy:  -Group instruction provided by PowerPoint, verbal discussion, and written material to support subject matter. Instructor reviews respiratory cycle and anatomical components of the respiratory system and their functions. Instructor also reviews differences in  obstructive and restrictive respiratory diseases with examples of each. Intimacy, Sex, and Sexuality differences are reviewed with a discussion on how relationships can change when diagnosed with pulmonary disease. Common sexual concerns are reviewed.   MD DAY -A group question and answer session with a medical doctor that allows participants to ask questions that relate to their pulmonary disease state.   OTHER EDUCATION -Group or individual verbal, written, or video instructions that support the educational goals of the pulmonary rehab program. [place] from 12/11/2020 in [place]  Date 12/04/20  Educator handout  [Beat a Sedentary Lifestyle]       Holiday Eating Survival Tips:  -Group instruction provided by PowerPoint slides, verbal discussion, and written materials to support subject matter. The instructor gives patients tips, tricks, and techniques to help them not only survive but enjoy the holidays despite the onslaught of food that accompanies the holidays.   Knowledge Questionnaire Score:  Knowledge Questionnaire Score - 11/10/20 1542       Knowledge Questionnaire Score   Pre Score 13/18             Core Components/Risk Factors/Patient Goals at Admission:  Personal Goals and Risk Factors at Admission - 11/17/20 1149       Core Components/Risk Factors/Patient Goals on Admission   Heart Failure Yes    Intervention Provide a combined exercise and nutrition program that is supplemented with education, support and counseling about heart failure. Directed toward relieving symptoms such as shortness of breath, decreased exercise tolerance, and extremity edema.    Expected Outcomes Improve functional capacity of life;Short term: Attendance in program 2-3 days a week with increased exercise capacity. Reported lower sodium intake. Reported increased fruit and vegetable intake. Reports medication  compliance.;Short term: Daily weights obtained and reported for increase. Utilizing diuretic protocols set by physician.;Long term: Adoption of self-care skills and reduction of barriers for early signs and symptoms recognition and intervention leading to self-care maintenance.             Core Components/Risk Factors/Patient Goals Review:   Goals and Risk Factor Review     Row Name 11/17/20 1150 12/15/20 1034           Core Components/Risk Factors/Patient Goals Review   Personal Goals Review Heart Failure Heart Failure      Review Corrion will begin exercising on 11/18/2020, which is tomorrow, will hopefully see progression  towards goals in the next 30 days. Torrion's heart failure is stable at this time, he is working on gaining weight, not fluid overload.  He has gained 1kg.  He is slow to progress with his workloads on the equipment.      Expected Outcomes See admission goals. See admission goals.               Core Components/Risk Factors/Patient Goals at Discharge (Final Review):   Goals and Risk Factor Review - 12/15/20 1034       Core Components/Risk Factors/Patient Goals Review   Personal Goals Review Heart Failure    Review Rasheed's heart failure is stable at this time, he is working on gaining weight, not fluid overload.  He has gained 1kg.  He is slow to progress with his workloads on the equipment.    Expected Outcomes See admission goals.             ITP Comments:   Comments: ITP REVIEW Pt is making expected progress toward pulmonary rehab goals after completing 8 sessions. Recommend continued exercise, life style modification, education, and utilization of breathing techniques to increase stamina and strength and decrease shortness of breath with exertion.

## 2020-12-23 ENCOUNTER — Encounter (HOSPITAL_COMMUNITY)
Admission: RE | Admit: 2020-12-23 | Discharge: 2020-12-23 | Disposition: A | Discharge status: Home / Self Care | Payer: Medicare HMO | Source: Ambulatory Visit | Attending: Internal Medicine | Admitting: Internal Medicine

## 2020-12-23 ENCOUNTER — Other Ambulatory Visit: Payer: Self-pay

## 2020-12-23 VITALS — Wt 129.9 lb

## 2020-12-23 DIAGNOSIS — I5032 Chronic diastolic (congestive) heart failure: Secondary | ICD-10-CM

## 2020-12-23 NOTE — Progress Notes (Signed)
Daily Session Note  Patient Details  Name: [name] MRN: [id number] Date of Birth: [birth year] Referring Provider:   April Manson Pulmonary Rehab Walk Test from 11/10/2020 in [place]  Referring Provider Dr. Haroldine Laws       Encounter Date: 12/23/2020  Check In:  Session Check In - 12/23/20 1144       Check-In   Supervising physician immediately available to respond to emergencies [facility] immediately available    Physician(s) Dr. Sarajane Jews    Location [facility]    Staff Present Rosebud Poles, RN, Quentin Ore, MS, ACSM-CEP, Exercise Physiologist;Lisa Ysidro Evert, RN    Virtual Visit No    Medication changes reported     No    Fall or balance concerns reported    No    Tobacco Cessation No Change    Warm-up and Cool-down Performed as group-led instruction    Resistance Training Performed Yes    VAD Patient? No    PAD/SET Patient? No      Pain Assessment   Currently in Pain? No/denies    Multiple Pain Sites No             Capillary Blood Glucose: No results found for this or any previous visit (from the past 24 hour(s)).   Exercise Prescription Changes - 12/23/20 1200       Response to Exercise   Blood Pressure (Admit) 118/60    Blood Pressure (Exercise) 110/54    Blood Pressure (Exit) 116/60    Heart Rate (Admit) 78 bpm    Heart Rate (Exercise) 77 bpm    Heart Rate (Exit) 76 bpm    Oxygen Saturation (Admit) 99 %    Oxygen Saturation (Exercise) 95 %    Oxygen Saturation (Exit) 98 %    Rating of Perceived Exertion (Exercise) 11    Perceived Dyspnea (Exercise) 1    Duration Progress to 30 minutes of  aerobic without signs/symptoms of physical distress    Intensity THRR unchanged      Progression   Progression Continue to progress workloads to maintain intensity without signs/symptoms of physical distress.      Resistance Training   Training Prescription Yes    Weight red bands    Reps 10-15     Time 10 Minutes      NuStep   Level 3    SPM 60    Minutes 15    METs 1.6      Arm Ergometer   Level 1.5    RPM 23    Minutes 15             Social History   Tobacco Use  Smoking Status Former   Types: Cigars   Quit date: 09/03/2017   Years since quitting: 3.3  Smokeless Tobacco Never    Goals Met:  Proper associated with RPD/PD & O2 Sat Independence with exercise equipment Exercise tolerated well No report of concerns or symptoms today Strength training completed today  Goals Unmet:  Not Applicable  Comments: Service time is from 1015 to 1130    Dr. Fransico Him is Medical Director for Cardiac Rehab at [facility].

## 2020-12-25 ENCOUNTER — Other Ambulatory Visit: Payer: Self-pay

## 2020-12-25 ENCOUNTER — Encounter (HOSPITAL_COMMUNITY)
Admission: RE | Admit: 2020-12-25 | Discharge: 2020-12-25 | Disposition: A | Discharge status: Home / Self Care | Payer: Medicare HMO | Source: Ambulatory Visit | Attending: Internal Medicine | Admitting: Internal Medicine

## 2020-12-25 DIAGNOSIS — I5032 Chronic diastolic (congestive) heart failure: Secondary | ICD-10-CM

## 2020-12-25 DIAGNOSIS — R809 Proteinuria, unspecified: Secondary | ICD-10-CM | POA: Diagnosis not present

## 2020-12-25 DIAGNOSIS — R609 Edema, unspecified: Secondary | ICD-10-CM | POA: Diagnosis not present

## 2020-12-25 DIAGNOSIS — Z23 Encounter for immunization: Secondary | ICD-10-CM | POA: Diagnosis not present

## 2020-12-25 DIAGNOSIS — N183 Chronic kidney disease, stage 3 unspecified: Secondary | ICD-10-CM | POA: Diagnosis not present

## 2020-12-25 DIAGNOSIS — R319 Hematuria, unspecified: Secondary | ICD-10-CM | POA: Diagnosis not present

## 2020-12-25 NOTE — Progress Notes (Signed)
Daily Session Note  Patient Details  Name: [name] MRN: [id number] Date of Birth: 1950/10/21 Referring Provider:   April Manson Pulmonary Rehab Walk Test from 11/10/2020 in [place]  Referring Provider Dr. Haroldine Laws       Encounter Date: 12/25/2020  Check In:  Session Check In - 12/25/20 1112       Check-In   Supervising physician immediately available to respond to emergencies [facility] immediately available    Physician(s) Dr, Florene Glen    Location [facility]    Staff Present Rosebud Poles, RN, BSN;Hezzie Karim Ysidro Evert, Cathleen Fears, MS, ACSM-CEP, Exercise Physiologist    Virtual Visit No    Medication changes reported     No    Fall or balance concerns reported    No    Tobacco Cessation No Change    Warm-up and Cool-down Performed as group-led instruction    Resistance Training Performed Yes    VAD Patient? No    PAD/SET Patient? No      Pain Assessment   Currently in Pain? No/denies    Pain Score 0-No pain    Multiple Pain Sites No             Capillary Blood Glucose: No results found for this or any previous visit (from the past 24 hour(s)).    Social History   Tobacco Use  Smoking Status Former   Types: Cigars   Quit date: 09/03/2017   Years since quitting: 3.3  Smokeless Tobacco Never    Goals Met:  Exercise tolerated well No report of concerns or symptoms today Strength training completed today  Goals Unmet:  Not Applicable  Comments: Service time is from 1015 to 1138    Dr. Fransico Him is Medical Director for Cardiac Rehab at [facility].

## 2020-12-28 NOTE — Procedures (Signed)
   Patient Name: [name], [name] Date: 12/15/2020 Gender: Male D.O.B: May 21, 1950 Age (years): 22 Referring Provider: Fransico Him MD, ABSM Height (inches): 73 Interpreting Physician: Fransico Him MD, ABSM Weight (lbs): 120 RPSGT: Jorge Ny BMI: 16 MRN: [id number] Neck Size: 13.00  CLINICAL INFORMATION The patient is referred for a CPAP titration to treat sleep apnea.  SLEEP STUDY TECHNIQUE As per the AASM Manual for the Scoring of Sleep and Associated Events v2.3 (April 2016) with a hypopnea requiring 4% desaturations.  The channels recorded and monitored were frontal, central and occipital EEG, electrooculogram (EOG), submentalis EMG (chin), nasal and oral airflow, thoracic and abdominal wall motion, anterior tibialis EMG, snore microphone, electrocardiogram, and pulse oximetry. Continuous positive airway pressure (CPAP) was initiated at the beginning of the study and titrated to treat sleep-disordered breathing.  MEDICATIONS Medications self-administered by patient taken the night of the study : N/A  TECHNICIAN COMMENTS Comments added by technician: Patient had difficulty initiating sleep. Patient was restless all through the night. Patient had more than two awakenings to use the bathroom Comments added by scorer: N/A  RESPIRATORY PARAMETERS Optimal PAP Pressure (cm):  AHI at Optimal Pressure (/hr):N/A Overall Minimal O2 (%):89.0  Supine % at Optimal Pressure (%):N/A Minimal O2 at Optimal Pressure (%): 89.0   SLEEP ARCHITECTURE The study was initiated at 10:19:03 PM and ended at 4:45:24 AM.  Sleep onset time was 12.0 minutes and the sleep efficiency was 11.1%. The total sleep time was 43 minutes.  The patient spent 93.0% of the night in stage N1 sleep, 7.0% in stage N2 sleep, 0.0% in stage N3 and 0% in REM.Stage REM latency was N/A minutes  Wake after sleep onset was 331.4. Alpha intrusion was absent. Supine sleep was 17.44%.  CARDIAC DATA The 2 lead EKG  demonstrated Atrial fibrillation. The mean heart rate was 66.5 beats per minute. Other EKG findings include: PVCs.  LEG MOVEMENT DATA The total Periodic Limb Movements of Sleep (PLMS) were 0. The PLMS index was 0.0. A PLMS index of <15 is considered normal in adults.  IMPRESSIONS - An optimal PAP pressure could not be selected for this patient due to significant central apneas.  - Severe Central Sleep Apnea was noted during this titration (CAI = 96.3/h). - Mild oxygen desaturations were observed during this titration (min O2 = 89.0%). - No snoring was audible during this study. - 2-lead EKG demonstrated: PVCs and atrial fibrillation.  - Clinically significant periodic limb movements were not noted during this study. Arousals associated with PLMs were rare.  DIAGNOSIS - Obstructive Sleep Apnea (G47.33)  RECOMMENDATIONS - Recommend in lab BiPAP titration. - Avoid alcohol, sedatives and other CNS depressants that may worsen sleep apnea and disrupt normal sleep architecture. - Sleep hygiene should be reviewed to assess factors that may improve sleep quality. - Weight management and regular exercise should be initiated or continued.  [Electronically signed] 12/28/2020 09:36 PM  Fransico Him MD, ABSM Diplomate, American Board of Sleep Medicine

## 2020-12-30 ENCOUNTER — Other Ambulatory Visit: Payer: Self-pay

## 2020-12-30 ENCOUNTER — Encounter (HOSPITAL_COMMUNITY)
Admission: RE | Admit: 2020-12-30 | Discharge: 2020-12-30 | Disposition: A | Discharge status: Home / Self Care | Payer: Medicare HMO | Source: Ambulatory Visit | Attending: Internal Medicine | Admitting: Internal Medicine

## 2020-12-30 DIAGNOSIS — I5032 Chronic diastolic (congestive) heart failure: Secondary | ICD-10-CM

## 2020-12-30 NOTE — Progress Notes (Signed)
Daily Session Note  Patient Details  Name: [name] MRN: [id number] Date of Birth: [birth year] Referring Provider:   April Manson Pulmonary Rehab Walk Test from 11/10/2020 in [place]  Referring Provider Dr. Haroldine Laws       Encounter Date: 12/30/2020  Check In:  Session Check In - 12/30/20 1121       Check-In   Supervising physician immediately available to respond to emergencies [facility] immediately available    Physician(s) Dr. Florene Glen    [facility]    Staff Present Rosebud Poles, RN, Quentin Ore, MS, ACSM-CEP, Exercise Physiologist;Tawanna Funk Ysidro Evert, RN    Virtual Visit No    Medication changes reported     No    Fall or balance concerns reported    No    Tobacco Cessation No Change    Warm-up and Cool-down Performed as group-led instruction    Resistance Training Performed Yes    VAD Patient? No    PAD/SET Patient? No      Pain Assessment   Currently in Pain? No/denies    Multiple Pain Sites No             Capillary Blood Glucose: No results found for this or any previous visit (from the past 24 hour(s)).    Social History   Tobacco Use  Smoking Status Former   Types: Cigars   Quit date: 09/03/2017   Years since quitting: 3.3  Smokeless Tobacco Never    Goals Met:  Exercise tolerated well No report of concerns or symptoms today Strength training completed today  Goals Unmet:  Not Applicable  Comments: Service time is from 1028 to 1133    Dr. Fransico Him is Medical Director for Cardiac Rehab at [facility].

## 2020-12-31 ENCOUNTER — Other Ambulatory Visit (HOSPITAL_COMMUNITY): Payer: Self-pay

## 2020-12-31 MED ORDER — POTASSIUM CHLORIDE CRYS ER 20 MEQ PO TBCR: 20.0000 meq | EXTENDED_RELEASE_TABLET | Freq: Every day | ORAL | 3 refills | Status: DC

## 2021-01-01 ENCOUNTER — Other Ambulatory Visit: Payer: Self-pay

## 2021-01-01 ENCOUNTER — Encounter (HOSPITAL_COMMUNITY)
Admission: RE | Admit: 2021-01-01 | Discharge: 2021-01-01 | Disposition: A | Discharge status: Home / Self Care | Payer: Medicare HMO | Source: Ambulatory Visit | Attending: Internal Medicine | Admitting: Internal Medicine

## 2021-01-01 DIAGNOSIS — I5032 Chronic diastolic (congestive) heart failure: Secondary | ICD-10-CM

## 2021-01-01 NOTE — Progress Notes (Signed)
Daily Session Note  Patient Details  Name: [name] MRN: [id number] Date of Birth: Oct 23, 1950 Referring Provider:   April Manson Pulmonary Rehab Walk Test from 11/10/2020 in [place]  Referring Provider Dr. Haroldine Laws       Encounter Date: 01/01/2021  Check In:  Session Check In - 01/01/21 1120       Check-In   Supervising physician immediately available to respond to emergencies [facility] immediately available    Physician(s) Dr. Silvio Clayman    [facility]    Staff Present Rosebud Poles, RN, Quentin Ore, MS, ACSM-CEP, Exercise Physiologist;Lisa Ysidro Evert, RN    Virtual Visit No    Medication changes reported     No    Fall or balance concerns reported    No    Tobacco Cessation No Change    Warm-up and Cool-down Performed as group-led instruction    Resistance Training Performed Yes    VAD Patient? No    PAD/SET Patient? No      Pain Assessment   Currently in Pain? No/denies    Multiple Pain Sites No             Capillary Blood Glucose: No results found for this or any previous visit (from the past 24 hour(s)).    Social History   Tobacco Use  Smoking Status Former   Types: Cigars   Quit date: 09/03/2017   Years since quitting: 3.3  Smokeless Tobacco Never    Goals Met:  Proper associated with RPD/PD & O2 Sat Independence with exercise equipment Improved SOB with ADL's Exercise tolerated well No report of concerns or symptoms today Strength training completed today  Goals Unmet:  Not Applicable  Comments: Service time is from 1015 to 1142.    Dr. Fransico Him is Medical Director for Cardiac Rehab at [facility].

## 2021-01-03 NOTE — Telephone Encounter (Signed)
Jesse Hong, MD  Freada Bergeron, CMA  Unsuccessful CPAP titration due to ongoing events - please set up in lab BiPAP titration  Will precert for bipap titration.

## 2021-01-05 NOTE — Telephone Encounter (Signed)
The patient has been notified of the result and verbalized understanding.  All questions (if any) were answered. Marolyn Hammock, [place] 01/05/2021 2:32 PM

## 2021-01-06 ENCOUNTER — Other Ambulatory Visit: Payer: Self-pay

## 2021-01-06 ENCOUNTER — Encounter (HOSPITAL_COMMUNITY)
Admission: RE | Admit: 2021-01-06 | Discharge: 2021-01-06 | Disposition: A | Discharge status: Home / Self Care | Payer: Medicare HMO | Source: Ambulatory Visit | Attending: Internal Medicine | Admitting: Internal Medicine

## 2021-01-06 VITALS — Wt 129.9 lb

## 2021-01-06 DIAGNOSIS — I5032 Chronic diastolic (congestive) heart failure: Secondary | ICD-10-CM | POA: Diagnosis not present

## 2021-01-06 NOTE — Progress Notes (Signed)
Daily Session Note  Patient Details  Name: [name] MRN: [id number] Date of Birth: [birth year] Referring Provider:   April Manson Pulmonary Rehab Walk Test from 11/10/2020 in [place]  Referring Provider Dr. Haroldine Laws       Encounter Date: 01/06/2021  Check In:  Session Check In - 01/06/21 1117       Check-In   Supervising physician immediately available to respond to emergencies [facility] immediately available    Physician(s) Dr. Gerlean Ren    Location [facility]    Staff Present Rosebud Poles, RN, Milus Glazier, MS, ACSM-CEP, CCRP, Exercise Physiologist;Ilene Witcher Rosana Hoes, MS, ACSM-CEP, Exercise Physiologist;Lisa Ysidro Evert, RN    Virtual Visit No    Medication changes reported     No    Fall or balance concerns reported    No    Tobacco Cessation No Change    Warm-up and Cool-down Performed as group-led instruction    Resistance Training Performed Yes    VAD Patient? No    PAD/SET Patient? No      Pain Assessment   Currently in Pain? No/denies    Multiple Pain Sites No             Capillary Blood Glucose: No results found for this or any previous visit (from the past 24 hour(s)).    Social History   Tobacco Use  Smoking Status Former   Types: Cigars   Quit date: 09/03/2017   Years since quitting: 3.3  Smokeless Tobacco Never    Goals Met:  Proper associated with RPD/PD & O2 Sat Independence with exercise equipment Exercise tolerated well No report of concerns or symptoms today Strength training completed today  Goals Unmet:  Not Applicable  Comments: Service time is from 1016 to 1130.    Dr. Fransico Him is Medical Director for Cardiac Rehab at [facility].

## 2021-01-08 ENCOUNTER — Encounter (HOSPITAL_COMMUNITY)
Admission: RE | Admit: 2021-01-08 | Discharge: 2021-01-08 | Disposition: A | Discharge status: Home / Self Care | Payer: Medicare HMO | Source: Ambulatory Visit | Attending: Internal Medicine | Admitting: Internal Medicine

## 2021-01-08 ENCOUNTER — Other Ambulatory Visit: Payer: Self-pay

## 2021-01-08 ENCOUNTER — Telehealth (HOSPITAL_COMMUNITY): Payer: Self-pay | Admitting: *Deleted

## 2021-01-08 DIAGNOSIS — I5032 Chronic diastolic (congestive) heart failure: Secondary | ICD-10-CM

## 2021-01-08 NOTE — Telephone Encounter (Signed)
-----   Message from Jolaine Artist, MD sent at 12/31/2020  3:44 PM EDT ----- Please repeat   ----- Message ----- From: Buel Ream, Lab In [place] Sent: 12/17/2020  11:21 AM EDT To: Jolaine Artist, MD

## 2021-01-08 NOTE — Progress Notes (Signed)
Daily Session Note  Patient Details  Name: [name] MRN: [id number] Date of Birth: 12-Jul-1950 Referring Provider:   April Manson Pulmonary Rehab Walk Test from 11/10/2020 in [place]  Referring Provider Dr. Haroldine Laws       Encounter Date: 01/08/2021  Check In:  Session Check In - 01/08/21 1116       Check-In   Supervising physician immediately available to respond to emergencies [facility] immediately available    Physician(s) Dr. Nevada Crane    Location [facility]    Staff Present Rosebud Poles, RN, Quentin Ore, MS, ACSM-CEP, Exercise Physiologist;Shylyn Younce Ysidro Evert, RN    Virtual Visit No    Medication changes reported     No    Fall or balance concerns reported    No    Tobacco Cessation No Change    Warm-up and Cool-down Performed as group-led instruction    Resistance Training Performed Yes    VAD Patient? No    PAD/SET Patient? No      Pain Assessment   Currently in Pain? No/denies    Multiple Pain Sites No             Capillary Blood Glucose: No results found for this or any previous visit (from the past 24 hour(s)).    Social History   Tobacco Use  Smoking Status Former   Types: Cigars   Quit date: 09/03/2017   Years since quitting: 3.3  Smokeless Tobacco Never    Goals Met:  Exercise tolerated well No report of concerns or symptoms today Strength training completed today  Goals Unmet:  Not Applicable  Comments: Service time is from 1023 to 1137    Dr. Fransico Him is Medical Director for Cardiac Rehab at [facility].

## 2021-01-08 NOTE — Telephone Encounter (Signed)
Pt aware and repeat labs sch 10/12

## 2021-01-12 ENCOUNTER — Ambulatory Visit: Payer: Medicare HMO

## 2021-01-12 ENCOUNTER — Other Ambulatory Visit: Payer: Self-pay

## 2021-01-12 DIAGNOSIS — Z5181 Encounter for therapeutic drug level monitoring: Secondary | ICD-10-CM

## 2021-01-12 DIAGNOSIS — I059 Rheumatic mitral valve disease, unspecified: Secondary | ICD-10-CM

## 2021-01-12 LAB — POCT INR: INR: 3.3 — AB (ref 2.0–3.0)

## 2021-01-12 NOTE — Patient Instructions (Signed)
-   continue taking Warfarin 1 tablet daily except for 1/2 tablet on Thursdays.  -Recheck INR in 4 weeks. [facility] [phone].

## 2021-01-13 ENCOUNTER — Encounter (HOSPITAL_COMMUNITY)
Admission: RE | Admit: 2021-01-13 | Discharge: 2021-01-13 | Disposition: A | Discharge status: Home / Self Care | Payer: Medicare HMO | Source: Ambulatory Visit | Attending: Internal Medicine | Admitting: Internal Medicine

## 2021-01-13 DIAGNOSIS — I5032 Chronic diastolic (congestive) heart failure: Secondary | ICD-10-CM | POA: Diagnosis not present

## 2021-01-13 NOTE — Progress Notes (Signed)
Daily Session Note  Patient Details  Name: [name] MRN: [id number] Date of Birth: Apr 17, 1950 Referring Provider:   April Manson Pulmonary Rehab Walk Test from 11/10/2020 in [place]  Referring Provider Dr. Haroldine Laws       Encounter Date: 01/13/2021  Check In:  Session Check In - 01/13/21 1117       Check-In   Supervising physician immediately available to respond to emergencies [facility] immediately available    Physician(s) Dr. Nevada Crane    Location [facility]    Staff Present Rosebud Poles, RN, Quentin Ore, MS, ACSM-CEP, Exercise Physiologist;Verlee Pope Vicente Serene, MS, ACSM CEP, Exercise Physiologist    Virtual Visit No    Medication changes reported     No    Fall or balance concerns reported    No    Tobacco Cessation No Change    Warm-up and Cool-down Performed as group-led instruction    Resistance Training Performed Yes    VAD Patient? No    PAD/SET Patient? No      Pain Assessment   Currently in Pain? No/denies    Pain Score 0-No pain    Multiple Pain Sites No             Capillary Blood Glucose: No results found for this or any previous visit (from the past 24 hour(s)).    Social History   Tobacco Use  Smoking Status Former   Types: Cigars   Quit date: 09/03/2017   Years since quitting: 3.3  Smokeless Tobacco Never    Goals Met:  Exercise tolerated well No report of concerns or symptoms today Strength training completed today  Goals Unmet:  Not Applicable  Comments: Service time is from 1025 to 1140    Dr. Fransico Him is Medical Director for Cardiac Rehab at [facility].

## 2021-01-14 ENCOUNTER — Other Ambulatory Visit (HOSPITAL_COMMUNITY): Payer: Medicare HMO

## 2021-01-15 ENCOUNTER — Encounter (HOSPITAL_COMMUNITY)
Admission: RE | Admit: 2021-01-15 | Discharge: 2021-01-15 | Disposition: A | Discharge status: Home / Self Care | Payer: Medicare HMO | Source: Ambulatory Visit | Attending: Internal Medicine | Admitting: Internal Medicine

## 2021-01-15 ENCOUNTER — Other Ambulatory Visit: Payer: Self-pay

## 2021-01-15 DIAGNOSIS — I5032 Chronic diastolic (congestive) heart failure: Secondary | ICD-10-CM

## 2021-01-15 NOTE — Progress Notes (Addendum)
Daily Session Note  Patient Details  Name: [name] MRN: [id number] Date of Birth: Apr 18, 1950 Referring Provider:   April Manson Pulmonary Rehab Walk Test from 11/10/2020 in [place]  Referring Provider Dr. Haroldine Laws       Encounter Date: 01/15/2021  Check In:  Session Check In - 01/15/21 1000       Check-In   Supervising physician immediately available to respond to emergencies [facility] immediately available    Physician(s) Dr. Verlon Au    Location [facility]    Staff Present Rosebud Poles, RN, Quentin Ore, MS, ACSM-CEP, Exercise Physiologist;Lisa Vicente Serene, MS, ACSM CEP, Exercise Physiologist    Virtual Visit No    Medication changes reported     No    Fall or balance concerns reported    No    Tobacco Cessation No Change    Warm-up and Cool-down Performed as group-led instruction    Resistance Training Performed Yes    VAD Patient? No      Pain Assessment   Currently in Pain? Yes    Pain Score 3     Pain Location Neck    Pain Orientation Lower    Pain Onset More than a month ago    Pain Frequency Constant    Multiple Pain Sites No             Capillary Blood Glucose: No results found for this or any previous visit (from the past 24 hour(s)).    Social History   Tobacco Use  Smoking Status Former   Types: Cigars   Quit date: 09/03/2017   Years since quitting: 3.3  Smokeless Tobacco Never    Goals Met:  Proper associated with RPD/PD & O2 Sat Exercise tolerated well No report of concerns or symptoms today  Goals Unmet:  Not Applicable  Comments: Service time is from 0946 to 1009. Pt completed 6 MWT.   Dr. Fransico Him is Medical Director for Cardiac Rehab at [facility].

## 2021-01-16 ENCOUNTER — Ambulatory Visit (HOSPITAL_COMMUNITY)
Admission: RE | Admit: 2021-01-16 | Discharge: 2021-01-16 | Disposition: A | Discharge status: Home / Self Care | Payer: Medicare HMO | Source: Ambulatory Visit | Attending: Internal Medicine | Admitting: Internal Medicine

## 2021-01-16 DIAGNOSIS — I5032 Chronic diastolic (congestive) heart failure: Secondary | ICD-10-CM | POA: Diagnosis not present

## 2021-01-16 LAB — BASIC METABOLIC PANEL
Anion gap: 10 (ref 5–15)
BUN: 41 mg/dL — ABNORMAL HIGH (ref 8–23)
CO2: 25 mmol/L (ref 22–32)
Calcium: 9.1 mg/dL (ref 8.9–10.3)
Chloride: 101 mmol/L (ref 98–111)
Creatinine, Ser: 2.55 mg/dL — ABNORMAL HIGH (ref 0.61–1.24)
GFR, Estimated: 27 mL/min — ABNORMAL LOW (ref >60)
Glucose, Bld: 103 mg/dL — ABNORMAL HIGH (ref 70–99)
Potassium: 3.2 mmol/L — ABNORMAL LOW (ref 3.5–5.1)
Sodium: 136 mmol/L (ref 135–145)

## 2021-01-20 NOTE — Progress Notes (Signed)
Discharge Progress Report  Patient Details  Name: [name] MRN: [id number] Date of Birth: [birth year] Referring Provider:   [facility] Walk Test from 11/10/2020 in [place]  Referring Provider Dr. Haroldine Laws        Number of Visits: 16  Reason for Discharge:  Patient has met program and personal goals.  Smoking History:  Social History   Tobacco Use  Smoking Status Former   Types: Cigars   Quit date: 09/03/2017   Years since quitting: 3.3  Smokeless Tobacco Never    Diagnosis:  Chronic diastolic heart failure ([place])  ADL UCSD:  Pulmonary Assessment Scores     Row Name 11/10/20 1544 01/13/21 1526       ADL UCSD   ADL Phase Entry Exit    SOB Score total 41 35         CAT Score   CAT Score 13 17         mMRC Score   mMRC Score 3 --             Initial Exercise Prescription:  Initial Exercise Prescription - 11/10/20 1500       Date of Initial Exercise RX and Referring Provider   Date 11/10/20    Referring Provider Dr. Haroldine Laws    Expected Discharge Date 01/15/21      NuStep   Level 2    SPM 80    Minutes 15      Arm Ergometer   Level 1.5    RPM 40    Minutes 15      Prescription Details   Frequency (times per week) 2    Duration Progress to 30 minutes of continuous aerobic without signs/symptoms of physical distress      Intensity   THRR 40-80% of Max Heartrate 61-122    Ratings of Perceived Exertion 11-13    Perceived Dyspnea 0-4      Progression   Progression Continue to progress workloads to maintain intensity without signs/symptoms of physical distress.      Resistance Training   Training Prescription Yes    Weight red bands    Reps 10-15             Discharge Exercise Prescription (Final Exercise Prescription Changes):  Exercise Prescription Changes - 01/06/21 1200       Response to Exercise   Blood Pressure (Admit) 110/66    Blood Pressure (Exercise) 112/60     Blood Pressure (Exit) 98/54    Heart Rate (Admit) 77 bpm    Heart Rate (Exercise) 97 bpm    Heart Rate (Exit) 92 bpm    Oxygen Saturation (Admit) 94 %    Oxygen Saturation (Exercise) 98 %    Oxygen Saturation (Exit) 99 %    Rating of Perceived Exertion (Exercise) 11    Perceived Dyspnea (Exercise) 1    Duration Continue with 30 min of aerobic exercise without signs/symptoms of physical distress.    Intensity THRR unchanged      Progression   Progression Continue to progress workloads to maintain intensity without signs/symptoms of physical distress.      Resistance Training   Training Prescription Yes    Weight red bands    Reps 10-15    Time 10 Minutes      NuStep   Level 3    SPM 60    Minutes 15    METs 1.8      Arm Ergometer  Level 3    RPM 23    Minutes 15             Functional Capacity:  6 Minute Walk     Row Name 11/10/20 1529 01/15/21 1147       6 Minute Walk   Phase Initial Discharge    Distance 1011 feet 1073 feet    Distance % Change -- 6.13 %    Distance Feet Change -- 62 ft    Walk Time 6 minutes 6 minutes    # of Rest Breaks 0 0    MPH 1.91 2.03    METS 3.33 3.78    RPE 13 15    Perceived Dyspnea  3 3    VO2 Peak -- 13.21    Symptoms Yes (comment) No    Comments Pt stopped walk test at 5:30 due to SOB, stated he could not go any further --    Resting HR 71 bpm 78 bpm    Resting BP 85/50 108/60    Resting Oxygen Saturation  98 % 98 %    Exercise Oxygen Saturation  during 6 min walk 94 % 96 %    Max Ex. HR 115 bpm 155 bpm    Max Ex. BP 126/64 124/60    2 Minute Post BP 120/64 110/60         Interval HR   1 Minute HR 87 103    2 Minute HR 96 102    3 Minute HR 105 123    4 Minute HR 110 128    5 Minute HR 115 147    6 Minute HR 110 155    2 Minute Post HR 82 84    Interval Heart Rate? Yes Yes         Interval Oxygen   Interval Oxygen? Yes Yes    Baseline Oxygen Saturation % 98 % 98 %    1 Minute Oxygen Saturation % 98 %  100 %    1 Minute Liters of Oxygen 0 L 0 L    2 Minute Oxygen Saturation % 96 % 98 %    2 Minute Liters of Oxygen 0 L 0 L    3 Minute Oxygen Saturation % 95 % 97 %    3 Minute Liters of Oxygen 0 L 0 L    4 Minute Oxygen Saturation % 94 % 96 %    4 Minute Liters of Oxygen 0 L 0 L    5 Minute Oxygen Saturation % 94 % 97 %    5 Minute Liters of Oxygen 0 L 0 L    6 Minute Oxygen Saturation % 95 % 97 %    6 Minute Liters of Oxygen 0 L 0 L    2 Minute Post Oxygen Saturation % 98 % 100 %    2 Minute Post Liters of Oxygen 0 L 0 L             Psychological, QOL, Others - Outcomes: PHQ 2/9: Depression screen [facility] 2/9 01/15/2021 11/10/2020 09/03/2020 08/07/2020 06/16/2020  Decreased Interest 1 0 0 1 1  Down, Depressed, Hopeless 1 1 0 0 0  PHQ - 2 Score 2 1 0 1 1  Altered sleeping '3 1 1 ' - 2  Tired, decreased energy '2 2 1 ' - 2  Change in appetite '1 2 2 ' - 1  Feeling bad or failure about yourself  0 0 0 - 0  Trouble concentrating 0 0 0 -  0  Moving slowly or fidgety/restless 0 0 0 - 0  Suicidal thoughts 0 0 0 - 0  PHQ-9 Score 8 - 4 - 6  Difficult doing work/chores Somewhat difficult Somewhat difficult - - Somewhat difficult  Some recent data might be hidden    Quality of Life:   Personal Goals: Goals established at orientation with interventions provided to work toward goal.  Personal Goals and Risk Factors at Admission - 11/17/20 1149       Core Components/Risk Factors/Patient Goals on Admission   Heart Failure Yes    Intervention Provide a combined exercise and nutrition program that is supplemented with education, support and counseling about heart failure. Directed toward relieving symptoms such as shortness of breath, decreased exercise tolerance, and extremity edema.    Expected Outcomes Improve functional capacity of life;Short term: Attendance in program 2-3 days a week with increased exercise capacity. Reported lower sodium intake. Reported increased fruit and vegetable intake.  Reports medication compliance.;Short term: Daily weights obtained and reported for increase. Utilizing diuretic protocols set by physician.;Long term: Adoption of self-care skills and reduction of barriers for early signs and symptoms recognition and intervention leading to self-care maintenance.              Personal Goals Discharge:  Goals and Risk Factor Review     Row Name 11/17/20 1150 12/15/20 1034 01/05/21 1147         Core Components/Risk Factors/Patient Goals Review   Personal Goals Review Heart Failure Heart Failure Heart Failure     Review Labaron will begin exercising on 11/18/2020, which is tomorrow, will hopefully see progression towards goals in the next 30 days. Mithcell's heart failure is stable at this time, he is working on gaining weight, not fluid overload.  He has gained 1kg.  He is slow to progress with his workloads on the equipment. Dysen's heart failure is very controlled.  He weighs himself daily at home, follows a low sodium diet andfluid restrictions.  He is underweight, we have counselled him on how to gain weight, but he has only gained 2-3 pounds.     Expected Outcomes See admission goals. See admission goals. See admission goals.              Exercise Goals and Review:  Exercise Goals     Row Name 11/10/20 1155 11/18/20 0853 12/12/20 0901 01/06/21 0707       Exercise Goals   Increase Physical Activity Yes -- Yes Yes    Intervention Provide advice, education, support and counseling about physical activity/exercise needs.;Develop an individualized exercise prescription for aerobic and resistive training based on initial evaluation findings, risk stratification, comorbidities and participant's personal goals. -- Provide advice, education, support and counseling about physical activity/exercise needs.;Develop an individualized exercise prescription for aerobic and resistive training based on initial evaluation findings, risk stratification, comorbidities and  participant's personal goals. Provide advice, education, support and counseling about physical activity/exercise needs.;Develop an individualized exercise prescription for aerobic and resistive training based on initial evaluation findings, risk stratification, comorbidities and participant's personal goals.    Expected Outcomes Short Term: Attend rehab on a regular basis to increase amount of physical activity.;Long Term: Add in home exercise to make exercise part of routine and to increase amount of physical activity.;Long Term: Exercising regularly at least 3-5 days a week. -- Short Term: Attend rehab on a regular basis to increase amount of physical activity.;Long Term: Add in home exercise to make exercise part of routine and to increase  amount of physical activity.;Long Term: Exercising regularly at least 3-5 days a week. Short Term: Attend rehab on a regular basis to increase amount of physical activity.;Long Term: Add in home exercise to make exercise part of routine and to increase amount of physical activity.;Long Term: Exercising regularly at least 3-5 days a week.    Increase Strength and Stamina Yes -- Yes Yes    Intervention Provide advice, education, support and counseling about physical activity/exercise needs.;Develop an individualized exercise prescription for aerobic and resistive training based on initial evaluation findings, risk stratification, comorbidities and participant's personal goals. -- Provide advice, education, support and counseling about physical activity/exercise needs.;Develop an individualized exercise prescription for aerobic and resistive training based on initial evaluation findings, risk stratification, comorbidities and participant's personal goals. Provide advice, education, support and counseling about physical activity/exercise needs.;Develop an individualized exercise prescription for aerobic and resistive training based on initial evaluation findings, risk  stratification, comorbidities and participant's personal goals.    Expected Outcomes Short Term: Increase workloads from initial exercise prescription for resistance, speed, and METs.;Short Term: Perform resistance training exercises routinely during rehab and add in resistance training at home;Long Term: Improve cardiorespiratory fitness, muscular endurance and strength as measured by increased METs and functional capacity (6MWT) -- Short Term: Increase workloads from initial exercise prescription for resistance, speed, and METs.;Short Term: Perform resistance training exercises routinely during rehab and add in resistance training at home;Long Term: Improve cardiorespiratory fitness, muscular endurance and strength as measured by increased METs and functional capacity (6MWT) Short Term: Increase workloads from initial exercise prescription for resistance, speed, and METs.;Short Term: Perform resistance training exercises routinely during rehab and add in resistance training at home;Long Term: Improve cardiorespiratory fitness, muscular endurance and strength as measured by increased METs and functional capacity (6MWT)    Able to understand and use rate of perceived exertion (RPE) scale Yes -- Yes Yes    Intervention Provide education and explanation on how to use RPE scale -- Provide education and explanation on how to use RPE scale Provide education and explanation on how to use RPE scale    Expected Outcomes Short Term: Able to use RPE daily in rehab to express subjective intensity level;Long Term:  Able to use RPE to guide intensity level when exercising independently -- Short Term: Able to use RPE daily in rehab to express subjective intensity level;Long Term:  Able to use RPE to guide intensity level when exercising independently Short Term: Able to use RPE daily in rehab to express subjective intensity level;Long Term:  Able to use RPE to guide intensity level when exercising independently    Able to  understand and use Dyspnea scale Yes -- Yes Yes    Intervention Provide education and explanation on how to use Dyspnea scale -- Provide education and explanation on how to use Dyspnea scale Provide education and explanation on how to use Dyspnea scale    Expected Outcomes Short Term: Able to use Dyspnea scale daily in rehab to express subjective sense of shortness of breath during exertion;Long Term: Able to use Dyspnea scale to guide intensity level when exercising independently -- Short Term: Able to use Dyspnea scale daily in rehab to express subjective sense of shortness of breath during exertion;Long Term: Able to use Dyspnea scale to guide intensity level when exercising independently Short Term: Able to use Dyspnea scale daily in rehab to express subjective sense of shortness of breath during exertion;Long Term: Able to use Dyspnea scale to guide intensity level when exercising independently  Knowledge and understanding of Target Heart Rate Range (THRR) Yes -- Yes Yes    Intervention Provide education and explanation of THRR including how the numbers were predicted and where they are located for reference -- Provide education and explanation of THRR including how the numbers were predicted and where they are located for reference Provide education and explanation of THRR including how the numbers were predicted and where they are located for reference    Expected Outcomes Short Term: Able to state/look up THRR;Long Term: Able to use THRR to govern intensity when exercising independently;Short Term: Able to use daily as guideline for intensity in rehab -- Short Term: Able to state/look up THRR;Long Term: Able to use THRR to govern intensity when exercising independently;Short Term: Able to use daily as guideline for intensity in rehab Short Term: Able to state/look up THRR;Long Term: Able to use THRR to govern intensity when exercising independently;Short Term: Able to use daily as guideline for  intensity in rehab    Understanding of Exercise Prescription Yes -- Yes Yes    Intervention Provide education, explanation, and written materials on patient's individual exercise prescription -- Provide education, explanation, and written materials on patient's individual exercise prescription Provide education, explanation, and written materials on patient's individual exercise prescription    Expected Outcomes Short Term: Able to explain program exercise prescription;Long Term: Able to explain home exercise prescription to exercise independently -- Short Term: Able to explain program exercise prescription;Long Term: Able to explain home exercise prescription to exercise independently Short Term: Able to explain program exercise prescription;Long Term: Able to explain home exercise prescription to exercise independently             Exercise Goals Re-Evaluation:  Exercise Goals Re-Evaluation     Row Name 11/18/20 0854 12/12/20 0902 01/06/21 0707         Exercise Goal Re-Evaluation   Exercise Goals Review Increase Physical Activity;Increase Strength and Stamina;Able to understand and use rate of perceived exertion (RPE) scale;Able to understand and use Dyspnea scale;Knowledge and understanding of Target Heart Rate Range (THRR);Understanding of Exercise Prescription Increase Physical Activity;Increase Strength and Stamina;Able to understand and use rate of perceived exertion (RPE) scale;Able to understand and use Dyspnea scale;Knowledge and understanding of Target Heart Rate Range (THRR);Understanding of Exercise Prescription Increase Physical Activity;Increase Strength and Stamina;Able to understand and use rate of perceived exertion (RPE) scale;Able to understand and use Dyspnea scale;Knowledge and understanding of Target Heart Rate Range (THRR);Understanding of Exercise Prescription     Comments Pt is scheduled to begin exercise this week. Will continue to monitor and make adjustments as  necessary. Linville has completed 8 exercise sessions. He tolerates exercise fair and has not progressed because of his chronic neck pain and SOB. Samik is currently averaging 1.6-1.8 METs at level 2 on the Nustep and 23 rpm at level 1 on the arm ergometer. The goal is to increase Abdul's workload. He tolerates the warm up and cooldown well as both are performed standing. Will continue to monitor and progress as able. Yaviel has completed 12 exercise sessions. He tolerates exercise well and has progressed even with his chronic neck pain. Idrees is currently averaging 1.8 METs at level 3 on the Nustep and 26 rpm at level 2.5 on the arm ergometer. Leon and I have recently discussed his HEP. He is exercising on his bike. We talked about how to progress his HEP. Demonta tolerates the warm up and cooldown well as both are performed standing. Will continue to monitor and  progress as able.     Expected Outcomes Through exercise at rehab and home, the patient will decrease shortness of breath with daily activities and feel confident in carrying out an exercise regimn at home. Through exercise at rehab and home, the patient will decrease shortness of breath with daily activities and feel confident in carrying out an exercise regimn at home. Through exercise at rehab and home, the patient will decrease shortness of breath with daily activities and feel confident in carrying out an exercise regimn at home.              Nutrition & Weight - Outcomes:  Pre Biometrics - 11/10/20 1154       Pre Biometrics   Grip Strength 23 kg              Nutrition:  Nutrition Therapy & Goals - 11/28/20 1405       Nutrition Therapy   Diet TLC    Drug/Food Interactions Coumadin/Vit K      Personal Nutrition Goals   Nutrition Goal Pt to identify food quantities necessary to achieve weight gain of 0.5-1 lb per week at graduation from pulmonary rehab    Personal Goal #2 Pt to build a healthy plate including vegetables, fruits,  whole grains, and low-fat dairy products in a heart healthy meal plan.    Personal Goal #3 Pt to add healthy fats to all meals and snacks to incorporate more calories.      Intervention Plan   Intervention Prescribe, educate and counsel regarding individualized specific dietary modifications aiming towards targeted core components such as weight, hypertension, lipid management, diabetes, heart failure and other comorbidities.;Nutrition handout(s) given to patient.    Expected Outcomes Short Term Goal: A plan has been developed with personal nutrition goals set during dietitian appointment.;Long Term Goal: Adherence to prescribed nutrition plan.             Nutrition Discharge:   Education Questionnaire Score:  Knowledge Questionnaire Score - 11/10/20 1542       Knowledge Questionnaire Score   Pre Score 13/18             Goals reviewed with patient; copy given to patient.

## 2021-02-06 ENCOUNTER — Telehealth (HOSPITAL_COMMUNITY): Payer: Self-pay

## 2021-02-06 DIAGNOSIS — I5032 Chronic diastolic (congestive) heart failure: Secondary | ICD-10-CM

## 2021-02-06 MED ORDER — POTASSIUM CHLORIDE 20 MEQ PO PACK
40.0000 meq | PACK | Freq: Every day | ORAL | 11 refills | Status: DC
Start: 2021-02-06 — End: 2021-03-11

## 2021-02-06 NOTE — Telephone Encounter (Signed)
-----   Message from Conrad [place], NP sent at 02/05/2021  3:24 PM EDT ----- Increase potassium to 40 meq daily. Repeat BMET in 7 days.

## 2021-02-06 NOTE — Telephone Encounter (Signed)
Patient advised and verbalized understanding. Lab order entered, lab appt scheduled, new Rx sent in as a packet form because patient has a hard time swallowing pills.   Meds ordered this encounter  Medications   potassium chloride (KLOR-CON) 20 MEQ packet    Sig: Take 40 mEq by mouth daily.    Dispense:  60 packet    Refill:  11    D/c pill form Please cancel all previous orders for current medication. Change in dosage or pill size.   Orders Placed This Encounter  Procedures   Basic metabolic panel    Standing Status:   Future    Standing Expiration Date:   02/06/2022    Order Specific Question:   Release to patient    Answer:   Immediate

## 2021-02-09 ENCOUNTER — Ambulatory Visit: Payer: Medicare HMO

## 2021-02-09 ENCOUNTER — Other Ambulatory Visit: Payer: Self-pay

## 2021-02-09 DIAGNOSIS — I059 Rheumatic mitral valve disease, unspecified: Secondary | ICD-10-CM | POA: Diagnosis not present

## 2021-02-09 DIAGNOSIS — Z5181 Encounter for therapeutic drug level monitoring: Secondary | ICD-10-CM

## 2021-02-09 LAB — POCT INR: INR: 3.7 — AB (ref 2.0–3.0)

## 2021-02-09 NOTE — Patient Instructions (Signed)
Description   - Eat greens today and continue taking Warfarin 1 tablet daily except for 1/2 tablet on Thursdays.  -Recheck INR in 4 weeks. [facility] [phone].

## 2021-02-13 ENCOUNTER — Ambulatory Visit (HOSPITAL_COMMUNITY)
Admission: RE | Admit: 2021-02-13 | Discharge: 2021-02-13 | Disposition: A | Discharge status: Home / Self Care | Payer: Medicare HMO | Source: Ambulatory Visit | Attending: Cardiology | Admitting: Cardiology

## 2021-02-13 ENCOUNTER — Other Ambulatory Visit: Payer: Self-pay

## 2021-02-13 DIAGNOSIS — I5032 Chronic diastolic (congestive) heart failure: Secondary | ICD-10-CM | POA: Diagnosis present

## 2021-02-13 LAB — BASIC METABOLIC PANEL
Anion gap: 11 (ref 5–15)
BUN: 51 mg/dL — ABNORMAL HIGH (ref 8–23)
CO2: 23 mmol/L (ref 22–32)
Calcium: 9 mg/dL (ref 8.9–10.3)
Chloride: 99 mmol/L (ref 98–111)
Creatinine, Ser: 2.64 mg/dL — ABNORMAL HIGH (ref 0.61–1.24)
GFR, Estimated: 26 mL/min — ABNORMAL LOW (ref >60)
Glucose, Bld: 95 mg/dL (ref 70–99)
Potassium: 3.6 mmol/L (ref 3.5–5.1)
Sodium: 133 mmol/L — ABNORMAL LOW (ref 135–145)

## 2021-02-16 NOTE — Telephone Encounter (Addendum)
Prior Authorization for bipap tit sent to [employer] via web portal. Tracking Number.   Valid dates 10-24-20---1-18--23.

## 2021-02-16 NOTE — Telephone Encounter (Signed)
Prior Authorization for BIPAP TIT sent to [facility] via web portal. Tracking Number   Patient is scheduled for BIPAP Titration on 03/18/21. Patient understands his titration study will be done at [facility] sleep lab. Patient understands he will receive a letter in a week or so detailing appointment, date, time, and location. Patient understands to call if he does not receive the letter in a timely manner. Patient agrees with treatment and thanked me for call.

## 2021-02-25 ENCOUNTER — Ambulatory Visit (INDEPENDENT_AMBULATORY_CARE_PROVIDER_SITE_OTHER): Payer: Medicare HMO | Admitting: Family Medicine

## 2021-02-25 ENCOUNTER — Other Ambulatory Visit: Payer: Self-pay

## 2021-02-25 ENCOUNTER — Encounter: Payer: Self-pay | Admitting: Family Medicine

## 2021-02-25 VITALS — BP 94/61 | HR 87 | Temp 97.7°F | Ht 73.0 in | Wt 124.2 lb

## 2021-02-25 DIAGNOSIS — M2550 Pain in unspecified joint: Secondary | ICD-10-CM | POA: Diagnosis not present

## 2021-02-25 DIAGNOSIS — K117 Disturbances of salivary secretion: Secondary | ICD-10-CM

## 2021-02-25 DIAGNOSIS — R630 Anorexia: Secondary | ICD-10-CM | POA: Diagnosis not present

## 2021-02-25 DIAGNOSIS — B349 Viral infection, unspecified: Secondary | ICD-10-CM

## 2021-02-25 NOTE — Progress Notes (Signed)
CHIEF COMPLAINT / HPI: Complained of 2 weeks myalgias, difficulty sleeping.  Increased salivation.  Decreased appetite.  He will occasionally get hungry but sit down in front of his food and and feel like he can eat it.  He has been trying to force himself to eat.  He has had occasional headache, some muscle aches in his shoulders and neck.  He has not had any cough.  He generally feels pretty miserable.  [name] he has not had any sweats or fever that he is aware of.  No change in bowel movement.  No increase frequency of urination.  No abdominal pain.  He does note that his symptoms are little bit better today than they were yesterday.  He has multiple medical issues and is currently being worked up for sleep apnea.  He follows his weight fairly closely secondary to his CHF diagnosis and he tries to follow the 1000 mL a day fluid intake.  No abdominal pain.  No diarrhea.  No constipation.  Increased fatigue.   PERTINENT  PMH / PSH: I have reviewed the patient's medications, allergies, past medical and surgical history, smoking status and updated in the EMR as appropriate. 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  --- EF 35-40% June 2022 --- admitted for GI bleed 11/23/2020 after polypectomy   OBJECTIVE:  BP 94/61   Temp 97.7 F (36.5 C) (Oral)   Wt 124 lb 3.2 oz (56.3 kg)   SpO2 100%   BMI 17.20 kg/m  GENERAL: Well-developed cachectic male no acute distress.  Appears fatigued. CARDIOVASCULAR: Irregular rhythm.  S2 has valvular click.  Next LUNGS: No rales are heard.  He has air movement in all lung fields but decreased sounds throughout all fields. Abdomen: Soft, cachectic. PSYCH: AxOx4. Good eye contact.. No psychomotor retardation or agitation. Appropriate speech fluency and content. Asks and answers questions  appropriately. Mood is congruent.   ASSESSMENT / PLAN:  #1.  Arthralgias and myalgias with decreased appetite, increased salivation.  Unclear what this is, but I suspect it is most likely related to viral illness.Marland Kitchen His symptoms sound consistent with mild case of influenza however.,   He did take a flu shot this year.    He has quite a few chronic illnesses which could also be causing some of his symptoms.  I had a very long discussion with him.  We reviewed his most recent lab work which was a basic metabolic panel and INR.  I suggested getting some additional blood work today particularly CBC as he has some history of polyps and a GI bleed in the past.  His last CBC was in June.  He really did not want to pursue getting any blood work today.  He says he is very hard to get blood from he does not really want to go through that today.  We discussed options including potentially hospitalizing him for further work-up today and he did not want to do that.  He is frustrated because he has to go to the doctor's offices a lot, they always get blood work and it is usually very hard for them to get appropriate stick with the first try.  He does admit that he is feeling a little better today than he did yesterday.    Ultimately, he decided that he would go back home and see how he  feels over the next few days.  If he is continues to improve, he will follow-up with his PCP in 2 to 3 weeks.  If he worsens over the weekend, he will go immediately to the emergency department. No problem-specific Assessment & Plan notes found for this encounter.   Dorcas Mcmurray MD

## 2021-02-27 ENCOUNTER — Emergency Department (HOSPITAL_BASED_OUTPATIENT_CLINIC_OR_DEPARTMENT_OTHER)
Admission: EM | Admit: 2021-02-27 | Discharge: 2021-02-27 | Disposition: A | Discharge status: Home / Self Care | Payer: Medicare HMO | Attending: Emergency Medicine | Admitting: Emergency Medicine

## 2021-02-27 ENCOUNTER — Encounter (HOSPITAL_BASED_OUTPATIENT_CLINIC_OR_DEPARTMENT_OTHER): Payer: Self-pay

## 2021-02-27 ENCOUNTER — Other Ambulatory Visit: Payer: Self-pay

## 2021-02-27 ENCOUNTER — Ambulatory Visit: Payer: Self-pay | Admitting: *Deleted

## 2021-02-27 DIAGNOSIS — I4891 Unspecified atrial fibrillation: Secondary | ICD-10-CM | POA: Diagnosis not present

## 2021-02-27 DIAGNOSIS — I130 Hypertensive heart and chronic kidney disease with heart failure and stage 1 through stage 4 chronic kidney disease, or unspecified chronic kidney disease: Secondary | ICD-10-CM | POA: Diagnosis not present

## 2021-02-27 DIAGNOSIS — N184 Chronic kidney disease, stage 4 (severe): Secondary | ICD-10-CM | POA: Diagnosis not present

## 2021-02-27 DIAGNOSIS — Z87891 Personal history of nicotine dependence: Secondary | ICD-10-CM | POA: Insufficient documentation

## 2021-02-27 DIAGNOSIS — Z79899 Other long term (current) drug therapy: Secondary | ICD-10-CM | POA: Diagnosis not present

## 2021-02-27 DIAGNOSIS — Z7901 Long term (current) use of anticoagulants: Secondary | ICD-10-CM | POA: Diagnosis not present

## 2021-02-27 DIAGNOSIS — I5033 Acute on chronic diastolic (congestive) heart failure: Secondary | ICD-10-CM | POA: Diagnosis not present

## 2021-02-27 DIAGNOSIS — R040 Epistaxis: Secondary | ICD-10-CM | POA: Diagnosis not present

## 2021-02-27 LAB — PROTIME-INR
INR: 4 — ABNORMAL HIGH (ref 0.8–1.2)
Prothrombin Time: 38.6 seconds — ABNORMAL HIGH (ref 11.4–15.2)

## 2021-02-27 MED ORDER — TRANEXAMIC ACID 1000 MG/10ML IV SOLN
500.0000 mg | Freq: Once | INTRAVENOUS | Status: AC
  Administered 2021-02-27: 500 mg via TOPICAL
  Filled 2021-02-27: qty 10

## 2021-02-27 NOTE — ED Notes (Signed)
Pt. Reports L side of nose has been bleeding since earlier this morning. At present time the bleeding is controlled with gauze stuffed inside the L nare.  Pt. In no distress.

## 2021-02-27 NOTE — Telephone Encounter (Signed)
Summary: advice   Pt called the community line needing advice from a nurse, pt hung up while on hold for NT, pt stated he had a bloody nose and wasn't sure if he should be seen for this.      Reason for Disposition  [1] Bleeding present > 30 minutes AND [2] using correct method of direct pressure  Answer Assessment - Initial Assessment Questions 1. AMOUNT OF BLEEDING: "How bad is the bleeding?" "How much blood was lost?" "Has the bleeding stopped?"   - MILD: needed a couple tissues   - MODERATE: needed many tissues   - SEVERE: large blood clots, soaked many tissues, lasted more than 30 minutes      severe 2. ONSET: "When did the nosebleed start?"      This morning 3. FREQUENCY: "How many nosebleeds have you had in the last 24 hours?"      1 in other nostril  4. RECURRENT SYMPTOMS: "Have there been other recent nosebleeds?" If Yes, ask: "How long did it take you to stop the bleeding?" "What worked best?"      Yes- due to weather 5. CAUSE: "What do you think caused this nosebleed?"     sinus 6. LOCAL FACTORS: "Do you have any cold symptoms?", "Have you been rubbing or picking at your nose?"     Thought he had sinus cold 7. SYSTEMIC FACTORS: "Do you have high blood pressure or any bleeding problems?"     no 8. BLOOD THINNERS: "Do you take any blood thinners?" (e.g., aspirin, clopidogrel / Plavix, coumadin, heparin). Notes: Other strong blood thinners include: Arixtra (fondaparinux), Eliquis (apixaban), Pradaxa (dabigatran), and Xarelto (rivaroxaban).     warfarin 9. OTHER SYMPTOMS: "Do you have any other symptoms?" (e.g., lightheadedness)     No- out of it 10. PREGNANCY: "Is there any chance you are pregnant?" "When was your last menstrual period?"       na  Protocols used: Nosebleed-A-AH

## 2021-02-27 NOTE — ED Provider Notes (Signed)
[facility]ER[facility]ENC[facility] DEPARTMENT Provider Note   CSN: [id number] Arrival date & time: 02/27/21  1627     History Chief Complaint  Patient presents with   Epistaxis    [name] is a 70 y.o. male.  HPI  Patient with significant medical history including aneurysm of the ascending aorta, atrial fib currently on Eliquis, hypertension, mitral valve repair presents with chief complaint of epistasis.  Patient states nose started to bleed this morning around 8 AM, states, out of his left nare, denies  trauma to the area, no pain associated.  He states he tried packing multiple times not much relief.  States he feels some blood wound on the posterior pharynx.  Patient states he has had nosebleeds in the past Never had to be hospitalized for this, never had a Rhino Rocket in place.  He denies alleviating or aggravating factors.  Spoke with his primary care doctors told him come here for further evaluation.  Does not endorse headaches, fevers, chills, shortness of breath, chest pain, vomiting, generalized body aches.  Past Medical History:  Diagnosis Date   Allergy    Aneurysm of ascending aorta    Atrial fibrillation (HCC)    BPH (benign prostatic hyperplasia)    Colon polyps    FHx: rheumatic heart disease    H/O diplopia    Hepatitis C    Hypertension    Hypogonadism male    Mitral valve disease    mitral valve repair/replacement x3   OSA (obstructive sleep apnea) 12/15/2020    Patient Active Problem List   Diagnosis Date Noted   OSA (obstructive sleep apnea) 12/15/2020   Rhomboid muscle strain 09/05/2020   Hospital discharge follow-up 06/17/2020   History of hypokalemia 06/17/2020   CKD (chronic kidney disease) stage 4, GFR 15-29 ml/min ([place]) 06/17/2020   Onychomycosis 06/17/2020   H/O mitral valve replacement with mechanical valve    Persistent atrial fibrillation (HCC)    Duodenal adenoma    Anticoagulated    Acute blood loss anemia 11/26/2019   Gastric  polyp    Benign neoplasm of colon    Vertigo 06/07/2018   Acute on chronic diastolic heart failure ([place]) 03/04/2018   Cirrhosis ([place]) 05/08/2014   Chronic atrial flutter ([place]) 06/05/2013   Hepatitis C 06/05/2013   Chronic diastolic heart failure ([place]) 06/05/2013   Encounter for therapeutic drug monitoring 04/30/2013   Mechanical mitral valve with surgery x3 01/08/2013   History of colonic polyps 11/14/2012   Thoracic aortic aneurysm 05/02/2012    Past Surgical History:  Procedure Laterality Date   BIOPSY  11/19/2019   Procedure: BIOPSY;  Surgeon: Yetta Flock, MD;  Location: [facility];  Service: Gastroenterology;;  EGD and COLON   COLON SURGERY     COLONOSCOPY     COLONOSCOPY N/A 11/14/2012   Procedure: COLONOSCOPY;  Surgeon: Lear Ng, MD;  Location: [facility];  Service: Endoscopy;  Laterality: N/A;   COLONOSCOPY WITH PROPOFOL N/A 11/19/2019   Procedure: COLONOSCOPY WITH PROPOFOL;  Surgeon: Yetta Flock, MD;  Location: [facility];  Service: Gastroenterology;  Laterality: N/A;   COLONOSCOPY WITH PROPOFOL N/A 11/27/2019   Procedure: COLONOSCOPY WITH PROPOFOL;  Surgeon: Doran Stabler, MD;  Location: [facility];  Service: Gastroenterology;  Laterality: N/A;   ESOPHAGOGASTRODUODENOSCOPY (EGD) WITH PROPOFOL N/A 05/08/2014   Procedure: ESOPHAGOGASTRODUODENOSCOPY (EGD) WITH PROPOFOL;  Surgeon: Lear Ng, MD;  Location: [facility];  Service: Endoscopy;  Laterality: N/A;   ESOPHAGOGASTRODUODENOSCOPY (EGD) WITH PROPOFOL  N/A 11/19/2019   Procedure: ESOPHAGOGASTRODUODENOSCOPY (EGD) WITH PROPOFOL;  Surgeon: Yetta Flock, MD;  Location: [facility];  Service: Gastroenterology;  Laterality: N/A;   ESOPHAGOGASTRODUODENOSCOPY (EGD) WITH PROPOFOL N/A 05/07/2020   Procedure: ESOPHAGOGASTRODUODENOSCOPY (EGD) WITH PROPOFOL;  Surgeon: Yetta Flock, MD;  Location: [facility];  Service: Gastroenterology;  Laterality: N/A;   HEMOSTASIS CLIP  PLACEMENT  11/19/2019   Procedure: HEMOSTASIS CLIP PLACEMENT;  Surgeon: Yetta Flock, MD;  Location: [facility];  Service: Gastroenterology;;   HEMOSTASIS CLIP PLACEMENT  11/27/2019   Procedure: HEMOSTASIS CLIP PLACEMENT;  Surgeon: Doran Stabler, MD;  Location: [facility];  Service: Gastroenterology;;   HEMOSTASIS CLIP PLACEMENT  05/07/2020   Procedure: HEMOSTASIS CLIP PLACEMENT;  Surgeon: Yetta Flock, MD;  Location: [facility];  Service: Gastroenterology;;   MITRAL VALVE REPLACEMENT     1973 (bioprosthesis), 1988 ([employer] mechanical vlave) , 2006 (repair due to leak)   POLYPECTOMY  11/19/2019   Procedure: POLYPECTOMY;  Surgeon: Yetta Flock, MD;  Location: [facility];  Service: Gastroenterology;;   POLYPECTOMY  05/07/2020   Procedure: POLYPECTOMY;  Surgeon: Yetta Flock, MD;  Location: [facility];  Service: Gastroenterology;;   RIGHT HEART CATH N/A 11/24/2018   Procedure: RIGHT HEART CATH;  Surgeon: Jolaine Artist, MD;  Location: [facility];  Service: Cardiovascular;  Laterality: N/A;   RIGHT HEART CATH N/A 10/23/2019   Procedure: RIGHT HEART CATH;  Surgeon: Jolaine Artist, MD;  Location: [facility];  Service: Cardiovascular;  Laterality: N/A;   SUBMUCOSAL LIFTING INJECTION  05/07/2020   Procedure: SUBMUCOSAL LIFTING INJECTION;  Surgeon: Yetta Flock, MD;  Location: [facility];  Service: Gastroenterology;;   UPPER GASTROINTESTINAL ENDOSCOPY     02-2010 and 05-2014       Family History  Family history unknown: Yes    Social History   Tobacco Use   Smoking status: Former    Types: Cigars    Quit date: 09/03/2017    Years since quitting: 3.4   Smokeless tobacco: Never  Vaping Use   Vaping Use: Never used  Substance Use Topics   Alcohol use: No   Drug use: No    Home Medications Prior to Admission medications   Medication Sig Start Date End Date Taking? Authorizing Provider  amoxicillin (AMOXIL) 500 MG  capsule TAKE 4 CAPSULES BY MOUTH 1 HOUR BEFORE DENTAL APPOINTMENT 03/06/20   Belva Crome, MD  Efinaconazole 10 % SOLN Apply to toenail daily for 48 weeks 06/17/20   [name], MD  enoxaparin (LOVENOX) 60 MG/0.6ML injection Inject 0.6 mLs (60 mg total) into the skin daily. 04/24/20   Belva Crome, MD  ferrous sulfate 325 (65 FE) MG tablet Take 1 tablet (325 mg total) by mouth every other day. In the evening. Patient taking differently: Take 325 mg by mouth every other day. In the evening. 12/02/19   Nita Sells, MD  furosemide (LASIX) 40 MG tablet Take 1 tablet (40 mg total) by mouth daily. TAKE 1 TABLET(40 MG) BY MOUTH DAILY 12/01/20   Bensimhon, Shaune Pascal, MD  metolazone (ZAROXOLYN) 2.5 MG tablet TAKE AS DIRECTED BY THE HEART FAILURE CLINIC 11/25/20   Bensimhon, Shaune Pascal, MD  polyethylene glycol (MIRALAX) 17 g packet Take 17 g by mouth daily as needed. 08/28/19   Armbruster, Carlota Raspberry, MD  potassium chloride (KLOR-CON) 20 MEQ packet Take 40 mEq by mouth daily. 02/06/21   Clegg, Amy D, NP  sildenafil (REVATIO) 20 MG  tablet Take 20 mg by mouth daily as needed (ED). 11/13/19   [provider]  spironolactone (ALDACTONE) 25 MG tablet Take 1 tablet (25 mg total) by mouth daily. 12/01/20   Bensimhon, Shaune Pascal, MD  Testosterone 1.62 % GEL Apply 2 Pump topically daily. 1 pump on each arm 03/05/20   [provider]  warfarin (COUMADIN) 5 MG tablet TAKE 1/2 TABLET BY MOUTH ON THURSDAY AND SATURDAY, AND THEN 1 TABLET ON SUNDAY, MONDAY, [place], WEDNESDAY, AND FRIDAY 10/31/20   Sharion Settler, DO    Allergies    Penicillins  Review of Systems   Review of Systems  Constitutional:  Negative for chills and fever.  HENT:  Positive for nosebleeds. Negative for congestion.   Respiratory:  Negative for shortness of breath.   Cardiovascular:  Negative for chest pain.  Gastrointestinal:  Negative for abdominal pain.  Genitourinary:  Negative for enuresis.  Musculoskeletal:   Negative for back pain.  Skin:  Negative for rash.  Neurological:  Negative for dizziness.  Hematological:  Does not bruise/bleed easily.   Physical Exam Updated Vital Signs BP 109/68 (BP Location: Right Arm)   Pulse 71   Temp (!) 97.5 F (36.4 C) (Oral)   Resp 17   Ht 6\' 1"  (1.854 m)   Wt 54.9 kg   SpO2 100%   BMI 15.96 kg/m   Physical Exam Vitals and nursing note reviewed.  Constitutional:      General: He is not in acute distress.    Appearance: Normal appearance. He is not ill-appearing or diaphoretic.  HENT:     Head: Normocephalic and atraumatic.     Nose: No congestion or rhinorrhea.     Comments: Patient had a packing in his left nostril, no bleeding present, packing was removed and a speculum exam was performed there is no apparent trauma within the nasal canal, no active bleeding present.      Mouth/Throat:     Mouth: Mucous membranes are moist.     Pharynx: Oropharynx is clear. No oropharyngeal exudate or posterior oropharyngeal erythema.  Eyes:     General: No scleral icterus.       Right eye: No discharge.        Left eye: No discharge.     Conjunctiva/sclera: Conjunctivae normal.  Pulmonary:     Effort: Pulmonary effort is normal. No respiratory distress.     Breath sounds: Normal breath sounds. No wheezing.  Musculoskeletal:     Cervical back: Neck supple.     Right lower leg: No edema.     Left lower leg: No edema.  Skin:    General: Skin is warm and dry.     Coloration: Skin is not jaundiced or pale.  Neurological:     Mental Status: He is alert and oriented to person, place, and time.  Psychiatric:        Mood and Affect: Mood normal.    ED Results / Procedures / Treatments   Labs (all labs ordered are listed, but only abnormal results are displayed) Labs Reviewed  PROTIME-INR - Abnormal; Notable for the following components:      Result Value   Prothrombin Time 38.6 (*)    INR 4.0 (*)    All other components within normal limits     EKG None  Radiology No results found.  Procedures .Epistaxis Management  Date/Time: 02/27/2021 7:22 PM Performed by: Marcello Fennel, PA-C Authorized by: Marcello Fennel, PA-C   Consent:  Consent obtained:  Verbal   Consent given by:  Patient   Risks, benefits, and alternatives were discussed: yes     Risks discussed:  Bleeding, infection, nasal injury and pain   Alternatives discussed:  No treatment, delayed treatment, alternative treatment, observation and referral Universal protocol:    Patient identity confirmed:  Verbally with patient Anesthesia:    Anesthesia method:  None Procedure details:    Treatment site:  Unable to specify   Treatment method:  Anterior pack and thrombin   Treatment complexity:  Limited   Treatment episode: initial   Post-procedure details:    Assessment:  Bleeding stopped   Procedure completion:  Tolerated well, no immediate complications   Medications Ordered in ED Medications  tranexamic acid (CYKLOKAPRON) injection 500 mg (500 mg Topical Given 02/27/21 1823)    ED Course  I have reviewed the triage vital signs and the nursing notes.  Pertinent labs & imaging results that were available during my care of the patient were reviewed by me and considered in my medical decision making (see chart for details).    MDM Rules/Calculators/A&P                          Initial impression-presents with epistasis.  Alert, no acute distress, vital signs reassuring.  Packing has been removed will repack with TXA,  recheck INR and reassess.  Work-up-INR mildly elevated at 4.  Reassessment-patient's left nare was repacked with gauze soaked in TXA.  Tolerates procedure well.  Will reassess.  Patient reassessed, gauze was removed, nasal passage was visualized there is no bleeding present, hematomstasis was achieved.  He is agreeable for discharge.  Rule out-low suspicion for systemic infection as patient nontoxic-appearing, vital signs  reassuring.  Low suspicion for significant blood loss requiring blood transfusion as he is normotensive, nontachycardic, not endorsing lightheadedness, dizziness or fatigue.  Noted the patient has it elevated INR goal is 3.5 he is at 4, will have him contact his provider to determine if half dose or no doses needed at this time.  Plan-  Epistasis resolved-provided with instruction how to stop epistasis, given strict return precautions, follow-up with ENT for further evaluation.  Vital signs have remained stable, no indication for hospital admission.  Patient discussed with attending and they agreed with assessment and plan.  Patient given at home care as well strict return precautions.  Patient verbalized that they understood agreed to said plan.  Final Clinical Impression(s) / ED Diagnoses Final diagnoses:  Epistaxis    Rx / DC Orders ED Discharge Orders     None        Marcello Fennel, PA-C 02/27/21 1923    Malvin Johns, MD 02/27/21 2240

## 2021-02-27 NOTE — Discharge Instructions (Signed)
Nosebleed has resolved, if you have another nosebleed I want you to pinch her nose and tuck your chin anterior chest hold this for 10 minutes.  And then release if bleeding continues to do this another 2 more times, if not the third time it does not stop he must come back for further evaluation.  Please avoid sneezing, blowing her nose, recommend a humidifier.  Your INR is elevated today, please call your provider who is managing this to determine if you need to half your dose.  Come back to the emergency department if you develop chest pain, shortness of breath, severe abdominal pain, uncontrolled nausea, vomiting, diarrhea.

## 2021-02-27 NOTE — ED Notes (Signed)
Patient discharged to home.  All discharge instructions reviewed.  Patient verbalized understanding via teachback method.  VS WDL.  Respirations even and unlabored.  Ambulatory out of ED.   °

## 2021-02-27 NOTE — Telephone Encounter (Signed)
Patient is calling to report he has had nose bleed all day. Patient states it stared this morning and it has not stopped. Patient is on blood thinner. Patient advised ED for evaluation.

## 2021-02-27 NOTE — ED Triage Notes (Signed)
Pt woke up with nose bleed from left nare this morning around 0800. States been bleeding since. Used afrin and packed nose without relief. On warfarin.

## 2021-03-03 ENCOUNTER — Telehealth: Payer: Self-pay | Admitting: Interventional Cardiology

## 2021-03-03 NOTE — Telephone Encounter (Signed)
Spoke with pt and advised per [facility] pt should hold Warfarin tonight and continue with packing or pressure.  Advised pt no strenuous activity tonight and plan to keep appointment with anticoag clinic tomorrow 03/04/2021.  Reviewed ED precautions.  Pt verbalizes understanding and agrees with current plan.

## 2021-03-03 NOTE — Telephone Encounter (Signed)
Spoke with pt who was seen in the ED 11/25 for nosebleed.  Pt's INR was 4.0.  Pt has continued taking Coumadin as prescribed.  He reports nose continues to drip blood and he currently has his nostril packed.  He states bleeding is not as heavy as it was this weekend.  He does have an appointment 03/04/2021 with anticoag clinic. Will forward to Dr Tamala Julian and [place] [facility] for further assist.  Pt verbalizes understanding and agrees with current plan.

## 2021-03-03 NOTE — Telephone Encounter (Signed)
[name] is calling stating he had a nose bleed this weekend that started back today and has been on going for 20 mins still dripping. He is wanting to know what he should do in regards to this.

## 2021-03-04 ENCOUNTER — Ambulatory Visit: Payer: Medicare HMO

## 2021-03-04 ENCOUNTER — Other Ambulatory Visit: Payer: Self-pay

## 2021-03-04 DIAGNOSIS — I059 Rheumatic mitral valve disease, unspecified: Secondary | ICD-10-CM

## 2021-03-04 DIAGNOSIS — I4892 Unspecified atrial flutter: Secondary | ICD-10-CM

## 2021-03-04 DIAGNOSIS — Z5181 Encounter for therapeutic drug level monitoring: Secondary | ICD-10-CM

## 2021-03-04 LAB — POCT INR: INR: 4.6 — AB (ref 2.0–3.0)

## 2021-03-04 NOTE — Patient Instructions (Signed)
Description   Skip today's dosage of Warfarin, then start taking 1 tablet daily except for 1/2 tablet on Sundays and Thursdays.  Recheck INR in 1 week when you see Dr Tamala Julian. [facility] [phone].

## 2021-03-11 ENCOUNTER — Other Ambulatory Visit (HOSPITAL_COMMUNITY): Payer: Self-pay | Admitting: *Deleted

## 2021-03-11 MED ORDER — POTASSIUM CHLORIDE CRYS ER 20 MEQ PO TBCR: 40.0000 meq | EXTENDED_RELEASE_TABLET | Freq: Every day | ORAL | 3 refills | Status: AC

## 2021-03-12 NOTE — Progress Notes (Signed)
Cardiology Office Note:    Date:  03/13/2021   ID:  [name], DOB Jan 12, 1951, MRN [id number]  PCP:  Sharion Settler, DO  Cardiologist:  Sinclair Grooms, MD   Referring MD: [name], MD   Chief Complaint  Patient presents with   Atrial Fibrillation   Congestive Heart Failure     History of Present Illness:    [name] is a 70 y.o. 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 Other problems include hepatitis C, chronic atrial flutter, ascending aortic aneurysm 4.5 cm, chronic kidney disease stage III, chronic anemia and chronic anticoagulation.  Recent nosebleed that required packing.  [name] feels terrible.  He feels weak.  He continues to lose weight.  He has to force himself to eat.  He denies shortness of breath.  Lower extremity swelling is improved.  He has not had syncope.  He appears depressed.  He wants to know what is going on.  We did have a significant conversation concerning his overall prognosis.  I did explain to him that my rationale for referring to advanced heart failure was to determine if he would be a transplant candidate.  Both liver disease and kidney disease have prevented that from being an alternative treatment strategy.  Unfortunately we are doing the best we can do and the overall prognosis is poor.  I do not think he is at a point where hospice/palliative care would be appropriate.  He appreciated the conversation.  He already understood that his treatment options were very limited.  He is to have a sleep study performed.  I think it may be BiPAP titration.  CPAP titration failed to be effective.  Past Medical History:  Diagnosis Date   Allergy    Aneurysm of ascending aorta    Atrial fibrillation (HCC)    BPH (benign prostatic hyperplasia)    Colon polyps     FHx: rheumatic heart disease    H/O diplopia    Hepatitis C    Hypertension    Hypogonadism male    Mitral valve disease    mitral valve repair/replacement x3   OSA (obstructive sleep apnea) 12/15/2020    Past Surgical History:  Procedure Laterality Date   BIOPSY  11/19/2019   Procedure: BIOPSY;  Surgeon: Yetta Flock, MD;  Location: [facility];  Service: Gastroenterology;;  EGD and COLON   COLON SURGERY     COLONOSCOPY     COLONOSCOPY N/A 11/14/2012   Procedure: COLONOSCOPY;  Surgeon: Lear Ng, MD;  Location: [facility];  Service: Endoscopy;  Laterality: N/A;   COLONOSCOPY WITH PROPOFOL N/A 11/19/2019   Procedure: COLONOSCOPY WITH PROPOFOL;  Surgeon: Yetta Flock, MD;  Location: [facility];  Service: Gastroenterology;  Laterality: N/A;   COLONOSCOPY WITH PROPOFOL N/A 11/27/2019   Procedure: COLONOSCOPY WITH PROPOFOL;  Surgeon: Doran Stabler, MD;  Location: [facility];  Service: Gastroenterology;  Laterality: N/A;   ESOPHAGOGASTRODUODENOSCOPY (EGD) WITH PROPOFOL N/A 05/08/2014   Procedure: ESOPHAGOGASTRODUODENOSCOPY (EGD) WITH PROPOFOL;  Surgeon: Lear Ng, MD;  Location: [facility];  Service: Endoscopy;  Laterality: N/A;   ESOPHAGOGASTRODUODENOSCOPY (EGD) WITH PROPOFOL N/A 11/19/2019   Procedure: ESOPHAGOGASTRODUODENOSCOPY (EGD) WITH PROPOFOL;  Surgeon: Yetta Flock, MD;  [facility]: [facility];  Service: Gastroenterology;  Laterality: N/A;   ESOPHAGOGASTRODUODENOSCOPY (EGD) WITH PROPOFOL N/A 05/07/2020  Procedure: ESOPHAGOGASTRODUODENOSCOPY (EGD) WITH PROPOFOL;  Surgeon: Yetta Flock, MD;  [facility];  Service: Gastroenterology;  Laterality: N/A;   HEMOSTASIS CLIP PLACEMENT  11/19/2019   Procedure: HEMOSTASIS CLIP PLACEMENT;  Surgeon: Yetta Flock, MD;  Location: [facility];  Service: Gastroenterology;;   HEMOSTASIS CLIP PLACEMENT  11/27/2019   Procedure: HEMOSTASIS CLIP PLACEMENT;  Surgeon: Doran Stabler, MD;  Location: [facility];  Service: Gastroenterology;;   HEMOSTASIS CLIP PLACEMENT  05/07/2020   Procedure: HEMOSTASIS CLIP PLACEMENT;  Surgeon: Yetta Flock, MD;  Location: [facility];  Service: Gastroenterology;;   MITRAL VALVE REPLACEMENT     1973 (bioprosthesis), 1988 (St Jude mechanical vlave) , 2006 (repair due to leak)   POLYPECTOMY  11/19/2019   Procedure: POLYPECTOMY;  Surgeon: Yetta Flock, MD;  Location: [facility];  Service: Gastroenterology;;   POLYPECTOMY  05/07/2020   Procedure: POLYPECTOMY;  Surgeon: Yetta Flock, MD;  Location: [facility];  Service: Gastroenterology;;   RIGHT HEART CATH N/A 11/24/2018   Procedure: RIGHT HEART CATH;  Surgeon: Jolaine Artist, MD;  Location: [facility];  Service: Cardiovascular;  Laterality: N/A;   RIGHT HEART CATH N/A 10/23/2019   Procedure: RIGHT HEART CATH;  Surgeon: Jolaine Artist, MD;  Location: [facility];  Service: Cardiovascular;  Laterality: N/A;   SUBMUCOSAL LIFTING INJECTION  05/07/2020   Procedure: SUBMUCOSAL LIFTING INJECTION;  Surgeon: Yetta Flock, MD;  Location: [facility];  Service: Gastroenterology;;   UPPER GASTROINTESTINAL ENDOSCOPY     02-2010 and 05-2014    Current Medications: Current Meds  Medication Sig   amoxicillin (AMOXIL) 500 MG capsule TAKE 4 CAPSULES BY MOUTH 1 HOUR BEFORE DENTAL APPOINTMENT   Efinaconazole 10 % SOLN Apply to toenail daily for 48 weeks   enoxaparin (LOVENOX) 60 MG/0.6ML injection Inject 0.6 mLs (60 mg total) into the skin daily.   ferrous sulfate 325 (65 FE) MG tablet Take 1 tablet (325 mg total) by mouth every other day. In the evening. (Patient taking differently: Take 325 mg by mouth every other day. In the evening.)   furosemide (LASIX) 40 MG tablet Take 1 tablet (40 mg total) by mouth daily. TAKE 1 TABLET(40 MG) BY MOUTH DAILY   metolazone (ZAROXOLYN) 2.5 MG tablet TAKE AS DIRECTED BY THE HEART FAILURE CLINIC    polyethylene glycol (MIRALAX) 17 g packet Take 17 g by mouth daily as needed.   potassium chloride SA (KLOR-CON M) 20 MEQ tablet Take 2 tablets (40 mEq total) by mouth daily.   sildenafil (REVATIO) 20 MG tablet Take 20 mg by mouth daily as needed (ED).   spironolactone (ALDACTONE) 25 MG tablet Take 1 tablet (25 mg total) by mouth daily.   tadalafil (CIALIS) 5 MG tablet Take 5 mg by mouth daily.   Testosterone 1.62 % GEL Apply 2 Pump topically daily. 1 pump on each arm   warfarin (COUMADIN) 5 MG tablet TAKE 1/2 TABLET BY MOUTH ON THURSDAY AND SATURDAY, AND THEN 1 TABLET ON SUNDAY, MONDAY, TUESDAY, WEDNESDAY, AND FRIDAY     Allergies:   Penicillins   Social History   Socioeconomic History   Marital status: Divorced    Spouse name: Not on file   Number of children: 2   Years of education: 12   Highest education level: High school graduate  Occupational History   Not on file  Tobacco Use   Smoking status: Former    Types: Cigars    Quit date: 09/03/2017  Years since quitting: 3.5   Smokeless tobacco: Never  Vaping Use   Vaping Use: Never used  Substance and Sexual Activity   Alcohol use: No   Drug use: No   Sexual activity: Not Currently  Other Topics Concern   Not on file  Social History Narrative   Patient lives alone in [place].    Patient works out 4x a week on home exercise bike.    Patient plays guitar.    Social Determinants of Health   Financial Resource Strain: Low Risk    Difficulty of Paying Living Expenses: Not hard at all  Food Insecurity: No Food Insecurity   Worried About Charity fundraiser in the Last Year: Never true   [place] in the Last Year: Never true  Transportation Needs: No Transportation Needs   Lack of Transportation (Medical): No   Lack of Transportation (Non-Medical): No  Physical Activity: Insufficiently Active   Days of Exercise per Week: 4 days   Minutes of Exercise per Session: 20 min  Stress: No Stress Concern Present    Feeling of Stress : Only a little  Social Connections: Socially Isolated   Frequency of Communication with Friends and Family: More than three times a week   Frequency of Social Gatherings with Friends and Family: More than three times a week   Attends Religious Services: Never   Marine scientist or Organizations: No   Attends Archivist Meetings: Never   Marital Status: Divorced     Family History: The patient's Family history is unknown by patient.  ROS:   Please see the history of present illness.    Anorexia.  Weight loss.  Nosebleeds.  Recent nosebleed requiring packing.  He has phlegm in the back of his throat each day when he awakens.  He has difficulty sleeping.  He is to have a sleep study performed.  All other systems reviewed and are negative.  EKGs/Labs/Other Studies Reviewed:    The following studies were reviewed today: No new imaging  EKG:  EKG not performed  Recent Labs: 07/25/2020: ALT 42; Hemoglobin 9.4; NT-Pro BNP 8,332; Platelets 123 02/13/2021: BUN 51; Creatinine, Ser 2.64; Potassium 3.6; Sodium 133  Recent Lipid Panel No results found for: CHOL, TRIG, HDL, CHOLHDL, VLDL, LDLCALC, LDLDIRECT  Physical Exam:    VS:  BP (!) 98/52   Pulse 76   Ht 6\' 1"  (1.854 m)   Wt 123 lb 9.6 oz (56.1 kg)   SpO2 95%   BMI 16.31 kg/m     Wt Readings from Last 3 Encounters:  03/13/21 123 lb 9.6 oz (56.1 kg)  02/27/21 121 lb (54.9 kg)  02/25/21 124 lb 3.2 oz (56.3 kg)     GEN: Cachectic appearing. No acute distress HEENT: Normal NECK: No JVD. LYMPHATICS: No lymphadenopathy CARDIAC: Systolic right upper sternal murmur. RRR no gallop, or edema. VASCULAR:  Normal Pulses. No bruits. RESPIRATORY:  Clear to auscultation without rales, wheezing or rhonchi  ABDOMEN: Soft, non-tender, non-distended, No pulsatile mass, MUSCULOSKELETAL: No deformity  SKIN: Warm and dry NEUROLOGIC:  Alert and oriented x 3 PSYCHIATRIC:  Normal affect   ASSESSMENT:    1.  Mechanical mitral valve with surgery x3   2. Chronic diastolic heart failure ([place])   3. Cor pulmonale, chronic ([place])   4. Chronic atrial flutter (HCC)   5. CKD (chronic kidney disease) stage 4, GFR 15-29 ml/min (HCC)   6. OSA (obstructive sleep apnea)   7. Anticoagulated  PLAN:    In order of problems listed above:  Normal function.   No volume overload. Cor pulmonale/right heart failure related to pulmonary hypertension is the overriding clinical problem.  No clear management strategies to make this better.  Already being followed in the advanced heart failure clinic. Continue anticoagulation for both valve and atrial flutter. EKG is not performed today. Hopefully BiPAP will appropriately treat obstructive sleep apnea and in turn, perhaps improve pulmonary hypertension and right heart failure.   Overall tenuous prognosis with progressive muscle loss and anorexia.  Plan clinical follow-up in 4 to 6 months.  Prolonged office visit   Medication Adjustments/Labs and Tests Ordered: Current medicines are reviewed at length with the patient today.  Concerns regarding medicines are outlined above.  No orders of the defined types were placed in this encounter.  No orders of the defined types were placed in this encounter.   Patient Instructions  Medication Instructions:  Your physician recommends that you continue on your current medications as directed. Please refer to the Current Medication list given to you today.  *If you need a refill on your cardiac medications before your next appointment, please call your pharmacy*   Lab Work: None If you have labs (blood work) drawn today and your tests are completely normal, you will receive your results only by: [place] (if you have MyChart) OR A paper copy in the mail If you have any lab test that is abnormal or we need to change your treatment, we will call you to review the  results.   Testing/Procedures: None   Follow-Up: At [facility], you and your health needs are our priority.  As part of our continuing mission to provide you with exceptional heart care, we have created designated Provider Care Teams.  These Care Teams include your primary Cardiologist (physician) and Advanced Practice Providers (APPs -  Physician Assistants and Nurse Practitioners) who all work together to provide you with the care you need, when you need it.  We recommend signing up for the patient portal called "MyChart".  Sign up information is provided on this After Visit Summary.  MyChart is used to connect with patients for Virtual Visits (Telemedicine).  Patients are able to view lab/test results, encounter notes, upcoming appointments, etc.  Non-urgent messages can be sent to your provider as well.   To learn more about what you can do with MyChart, go to [email or web address].    Your next appointment:   4-6 month(s)  The format for your next appointment:   In Person  Provider:   Sinclair Grooms, MD     Other Instructions     Signed, Sinclair Grooms, MD  03/13/2021 11:56 AM    [place]

## 2021-03-13 ENCOUNTER — Other Ambulatory Visit: Payer: Self-pay

## 2021-03-13 ENCOUNTER — Ambulatory Visit: Payer: Medicare HMO | Admitting: Interventional Cardiology

## 2021-03-13 ENCOUNTER — Ambulatory Visit (INDEPENDENT_AMBULATORY_CARE_PROVIDER_SITE_OTHER): Payer: Medicare HMO | Admitting: *Deleted

## 2021-03-13 ENCOUNTER — Encounter: Payer: Self-pay | Admitting: Interventional Cardiology

## 2021-03-13 VITALS — BP 98/52 | HR 76 | Ht 73.0 in | Wt 123.6 lb

## 2021-03-13 DIAGNOSIS — G4733 Obstructive sleep apnea (adult) (pediatric): Secondary | ICD-10-CM | POA: Diagnosis not present

## 2021-03-13 DIAGNOSIS — Z5181 Encounter for therapeutic drug level monitoring: Secondary | ICD-10-CM

## 2021-03-13 DIAGNOSIS — I2781 Cor pulmonale (chronic): Secondary | ICD-10-CM | POA: Diagnosis not present

## 2021-03-13 DIAGNOSIS — I5032 Chronic diastolic (congestive) heart failure: Secondary | ICD-10-CM | POA: Diagnosis not present

## 2021-03-13 DIAGNOSIS — N184 Chronic kidney disease, stage 4 (severe): Secondary | ICD-10-CM

## 2021-03-13 DIAGNOSIS — Z7901 Long term (current) use of anticoagulants: Secondary | ICD-10-CM

## 2021-03-13 DIAGNOSIS — I4892 Unspecified atrial flutter: Secondary | ICD-10-CM

## 2021-03-13 DIAGNOSIS — I059 Rheumatic mitral valve disease, unspecified: Secondary | ICD-10-CM

## 2021-03-13 LAB — POCT INR: INR: 2.7 (ref 2.0–3.0)

## 2021-03-13 NOTE — Patient Instructions (Signed)
Description   Continue taking 1 tablet daily except for 1/2 tablet on Sundays and Thursdays.  Recheck INR in 3 weeks. [facility] [phone].

## 2021-03-13 NOTE — Patient Instructions (Signed)
Medication Instructions:  Your physician recommends that you continue on your current medications as directed. Please refer to the Current Medication list given to you today.  *If you need a refill on your cardiac medications before your next appointment, please call your pharmacy*   Lab Work: None If you have labs (blood work) drawn today and your tests are completely normal, you will receive your results only by: [place] (if you have MyChart) OR A paper copy in the mail If you have any lab test that is abnormal or we need to change your treatment, we will call you to review the results.   Testing/Procedures: None   Follow-Up: At [facility], you and your health needs are our priority.  As part of our continuing mission to provide you with exceptional heart care, we have created designated Provider Care Teams.  These Care Teams include your primary Cardiologist (physician) and Advanced Practice Providers (APPs -  Physician Assistants and Nurse Practitioners) who all work together to provide you with the care you need, when you need it.  We recommend signing up for the patient portal called "MyChart".  Sign up information is provided on this After Visit Summary.  MyChart is used to connect with patients for Virtual Visits (Telemedicine).  Patients are able to view lab/test results, encounter notes, upcoming appointments, etc.  Non-urgent messages can be sent to your provider as well.   To learn more about what you can do with MyChart, go to [email or web address].    Your next appointment:   4-6 month(s)  The format for your next appointment:   In Person  Provider:   Sinclair Grooms, MD     Other Instructions

## 2021-03-18 ENCOUNTER — Ambulatory Visit (HOSPITAL_BASED_OUTPATIENT_CLINIC_OR_DEPARTMENT_OTHER): Payer: Medicare HMO | Admitting: Cardiology

## 2021-03-19 ENCOUNTER — Other Ambulatory Visit: Payer: Self-pay

## 2021-03-19 ENCOUNTER — Ambulatory Visit (INDEPENDENT_AMBULATORY_CARE_PROVIDER_SITE_OTHER): Payer: Medicare HMO | Admitting: Family Medicine

## 2021-03-19 ENCOUNTER — Ambulatory Visit (INDEPENDENT_AMBULATORY_CARE_PROVIDER_SITE_OTHER): Payer: Medicare HMO

## 2021-03-19 ENCOUNTER — Encounter: Payer: Self-pay | Admitting: Family Medicine

## 2021-03-19 VITALS — BP 110/66 | HR 80 | Ht 73.0 in | Wt 122.4 lb

## 2021-03-19 DIAGNOSIS — Z23 Encounter for immunization: Secondary | ICD-10-CM

## 2021-03-19 DIAGNOSIS — R040 Epistaxis: Secondary | ICD-10-CM | POA: Diagnosis not present

## 2021-03-19 DIAGNOSIS — L299 Pruritus, unspecified: Secondary | ICD-10-CM

## 2021-03-19 MED ORDER — HYDROXYZINE HCL 10 MG PO TABS: 10.0000 mg | ORAL_TABLET | Freq: Three times a day (TID) | ORAL | 0 refills | Status: AC | PRN

## 2021-03-19 NOTE — Progress Notes (Signed)
° ° °  SUBJECTIVE:   CHIEF COMPLAINT / HPI: Nosebleeds and itching  Nosebleeds Patient reports that he has annual nosebleeds.  He reports that he usually has nosebleeds around this time of year when the weather is cold and dry.  He states that he normally has 1 major episode that will require him to go to the ED and followed by several minor episodes of bleeding.  He states that his episodes often take several hours to stop bleeding.  He reports that he will have some bleeding with exertion.  Patient reports that he takes both Lovenox and warfarin due to his arterial issues.  He reports that he was unable to complete a sleep study due to nosebleeding.  He reports using nasal spray such as Afrin before which sometimes help with the bleeding and sometimes not.  Patient reports that he has had nasal packing before for his nosebleed.  He is requesting referral to ENT. Patient denies cocaine or other illicit substance use. He denies smoking or use of fireplace in the home.   Itching Patient reports having itching all over his body.  He states that he has chronically dry skin and thinks it is made worse by his list of medications.  He reports the itching is worse at night.  He uses Goldbond lotion and Vaseline as barrier moisturizers.  He reports that he had scalp eczema that is currently well controlled.  PERTINENT  PMH / PSH:  Thoracic aortic aneurysm Mitral valve Chronic atrial flutter Chronic diastolic heart failure Persistent atrial fibrillation Hepatitis C Cirrhosis CKD, stage IV  OBJECTIVE:   BP 110/66    Pulse 80    Ht 6\' 1"  (1.854 m)    Wt 122 lb 6.4 oz (55.5 kg)    SpO2 100%    BMI 16.15 kg/m   Physical Exam HENT:     Nose: No nasal deformity or septal deviation.     Right Nostril: No foreign body, epistaxis, septal hematoma or occlusion.     Left Nostril: Epistaxis present. No foreign body, septal hematoma or occlusion.     Right Turbinates: Not enlarged.     Left Turbinates: Not  enlarged.     Comments: Septal erythema noted in left nostril, hemostatic at this time of exam  Cardiovascular:     Rate and Rhythm: Normal rate. Rhythm irregular.     Pulses: Normal pulses.  Pulmonary:     Effort: Pulmonary effort is normal.     Breath sounds: No wheezing or rales.  Skin:    General: Skin is warm and dry.     Capillary Refill: Capillary refill takes less than 2 seconds.     Coloration: Skin is not ashen or mottled.     Comments: Venous stasis changes in lower extremities Upper extremities without lesions or rashes  Skin appears moisturized on exam, no flaking   Neurological:     Mental Status: He is oriented to person, place, and time.    ASSESSMENT/PLAN:   Pruritus Counseled on continued use of moisturizers and skin barrier use such as vaseline  Will trial hydroxyzine for pruritis Counseled on potentially sedating effects of medication, patient voiced understanding   Epistaxis, recurrent Frequent episodes, likely 2/2 to lovenox and warfarin therapy  Referral placed for ENT evaluation      Eulis Foster, MD [place]

## 2021-03-19 NOTE — Patient Instructions (Signed)
I have prescribed a medication called atarax to help with your itching symptoms. I recommend taking this at night time first before taking during the day. This can be taken up to three times daily along with continued use of the lotions and vaseline to keep your skin moisturized.   Please stop taking if you notice this medication makes you excessively sleepy or lightheaded.   I have placed an ENT referral for your nose bleeds. Please be on the lookout for a call to schedule an appointment and notify our office if you do not hear from them within the next 2 weeks.   Nosebleed, Adult A nosebleed is when blood comes out of the nose. Nosebleeds are common. Usually, they are not a sign of a serious condition. Nosebleeds can happen if a blood vessel in your nose starts to bleed or if the lining of your nose (mucous membrane) cracks. They are commonly caused by: Allergies. Colds. Picking your nose. Blowing your nose too hard. An injury from sticking an object into your nose or getting hit in the nose. Dry or cold air. Less common causes of nosebleeds include: Toxic fumes. Something abnormal in the nose or in the air-filled spaces in the bones of the face (sinuses). Growths in the nose, such as polyps. Blood thinners or conditions that cause blood to clot slowly. Certain illnesses or procedures that irritate or dry out the nasal passages. Follow these instructions at home: When you have a nosebleed:  Sit down and tilt your head slightly forward. Use a clean towel or tissue to pinch your nostrils under the bony part of your nose. After 5 minutes, let go of your nose and see if bleeding starts again. Do not release pressure before that time. If there is still bleeding, repeat the pinching and holding for 5 minutes or until the bleeding stops. Do not place tissues or gauze in the nose to stop the bleeding. Avoid lying down and avoid tilting your head backward. That may make blood collect in the  throat and cause gagging or coughing. Use a nasal spray decongestant to help with a nosebleed as told by your health care provider. After a nosebleed: Avoid blowing your nose or sniffing for a number of hours. Avoid straining, lifting, or bending at the waist for several days. You may go back to other normal activities as you are able. If you are taking aspirin or blood thinners and you have nosebleeds, talk to your health care provider. These medicines make bleeding more likely. Ask your health care provider if you should stop taking the medicines or if you should adjust the dose. Do not stop taking medicines that your health care provider has recommended unless he or she tells you to stop taking them. If your nosebleed was caused by dry mucous membranes, use over-the-counter saline nasal spray or gel and a humidifier as told by your health care provider. This will keep the mucous membranes moist and allow them to heal. If you need to use one of these products: Choose one that is water-soluble. Use only as much as you need and use it only as often as needed. Do not lie down right after you use it. If you get nosebleeds often, talk with your health care provider about medical treatments. Options may include: Nasal cautery. This treatment stops and prevents nosebleeds by using a chemical swab or electrical device to lightly burn tiny blood vessels inside the nose. Nasal packing. A gauze or other material is placed  in the nose to keep constant pressure on the bleeding area. Contact a health care provider if you: Have a fever. Get nosebleeds often or more often than usual. Bruise very easily. Have a nosebleed from having something stuck in your nose. Have bleeding in your mouth. Vomit or cough up brown material. Have a nosebleed after you start a new medicine. Get help right away if: You have a nosebleed after a fall or a head injury. Your nosebleed does not go away after 20 minutes. You feel  dizzy or weak. You have unusual bleeding from other parts of your body. You have unusual bruising on other parts of your body. You become sweaty. You vomit blood. Summary A nosebleed is when blood comes out of the nose. Common causes include allergies, an injury to the nose, or cold or dry air. Initial treatment includes applying pressure for 5 minutes. Moisturizing the nose with saline nasal spray or gel after a nosebleed may help prevent future bleeding. Get help right away if your nosebleed does not go away after 20 minutes. This information is not intended to replace advice given to you by your health care provider. Make sure you discuss any questions you have with your health care provider. Document Revised: 01/18/2019 Document Reviewed: 01/18/2019 Elsevier Patient Education  2022 [employer].

## 2021-03-20 NOTE — Assessment & Plan Note (Signed)
Counseled on continued use of moisturizers and skin barrier use such as vaseline  Will trial hydroxyzine for pruritis Counseled on potentially sedating effects of medication, patient voiced understanding

## 2021-03-20 NOTE — Assessment & Plan Note (Signed)
Frequent episodes, likely 2/2 to lovenox and warfarin therapy  Referral placed for ENT evaluation

## 2021-03-23 ENCOUNTER — Telehealth: Payer: Self-pay

## 2021-03-23 NOTE — Telephone Encounter (Signed)
Received fax from pharmacy, PA needed on Hydroxyzine.  Clinical questions submitted via Cover My Meds.  Waiting on response, could take up to 72 hours.  Cover My Meds info: Key: [id number]  Talbot Grumbling, RN

## 2021-03-24 NOTE — Telephone Encounter (Signed)
Medication approved. Approval from 04/05/20-04/04/2021.  Called pharmacy with approval.   Talbot Grumbling, RN

## 2021-04-03 ENCOUNTER — Telehealth (HOSPITAL_COMMUNITY): Payer: Self-pay

## 2021-04-03 NOTE — Telephone Encounter (Signed)
Patient called stating that he has had an increase in p[place] over the last 3-4 weeks. He states that he told his pcp and she told him it sounds like he has the common cold. Patient denies weight gain and swelling in abdomen and limbs but states he has had increase shortness of breath. I advised patient that he should follow up with his pcp but patient wanted to know if you had any other recommendations first. Please advise

## 2021-04-03 NOTE — Telephone Encounter (Signed)
Patient advised and verbalized understanding 

## 2021-04-03 NOTE — Telephone Encounter (Signed)
Agree w/ PCP.  Sound like mostly upper respiratory/ cold like symptoms in the absence of any weight gain or swelling. If symptoms continue, he can try increasing lasix to 40 mg bid x 1 day to see if any improvement. Continue to monitor weight and check for development of any swelling.   Lyda Jester, PA-C

## 2021-04-08 ENCOUNTER — Other Ambulatory Visit: Payer: Self-pay

## 2021-04-08 ENCOUNTER — Ambulatory Visit: Payer: Medicare HMO

## 2021-04-08 DIAGNOSIS — I059 Rheumatic mitral valve disease, unspecified: Secondary | ICD-10-CM | POA: Diagnosis not present

## 2021-04-08 DIAGNOSIS — I4892 Unspecified atrial flutter: Secondary | ICD-10-CM | POA: Diagnosis not present

## 2021-04-08 DIAGNOSIS — Z5181 Encounter for therapeutic drug level monitoring: Secondary | ICD-10-CM

## 2021-04-08 LAB — POCT INR: INR: 6 — AB (ref 2.0–3.0)

## 2021-04-08 NOTE — Patient Instructions (Addendum)
Description   Do NOT take Warfarin today or tomorrow and Friday and then continue taking 1 tablet daily except for 1/2 tablet on Sundays and Thursdays.  Recheck INR in 6 days. [facility] [phone].

## 2021-04-15 ENCOUNTER — Other Ambulatory Visit: Payer: Self-pay

## 2021-04-15 ENCOUNTER — Ambulatory Visit: Payer: Medicare HMO | Admitting: *Deleted

## 2021-04-15 DIAGNOSIS — I059 Rheumatic mitral valve disease, unspecified: Secondary | ICD-10-CM

## 2021-04-15 DIAGNOSIS — Z5181 Encounter for therapeutic drug level monitoring: Secondary | ICD-10-CM

## 2021-04-15 LAB — POCT INR: INR: 2.2 (ref 2.0–3.0)

## 2021-04-15 MED ORDER — WARFARIN SODIUM 5 MG PO TABS
ORAL_TABLET | ORAL | 0 refills | Status: AC
Start: 2021-04-15 — End: ?

## 2021-04-15 NOTE — Patient Instructions (Signed)
Description    Take 1.5 tablets of warfarin today and then continue to take warfarin 1 tablet daily except for 1/2 a tablet on Sundays and Thursdays. Recheck INR in 1 week. [facility] [phone]

## 2021-04-16 ENCOUNTER — Encounter (HOSPITAL_BASED_OUTPATIENT_CLINIC_OR_DEPARTMENT_OTHER): Payer: Medicare HMO | Admitting: Cardiology

## 2021-04-23 ENCOUNTER — Telehealth (HOSPITAL_COMMUNITY): Payer: Self-pay

## 2021-04-23 NOTE — Telephone Encounter (Signed)
Called and spoke w/pt. She states wt is down and no edema however he has had progressive SOB over past week. He states he is fine at rest with can not lay flat and gets sob with minimal exertion. Pt has decreased appetite and feels full/bloated after a few bites of food. Pt has been taking all meds including Lasix 40 mg Daily and Metolazone 2.5 mg about twice a week, most recently yesterday. He states it may have helped some but not really. Pt does not feel he needs to be in hospital or ER at this time, reviewed er precautions. Advised I will discuss w/provider and call him in AM, he is agreeable

## 2021-04-23 NOTE — Telephone Encounter (Signed)
Patient reports that he has been experiencing shortness of breath for about 4 days. He reports that he cant lay flat in bed due to being sob, he reports that his weight is down from 122lbs to 119lbs. He also reports that he has been experiencing a little dizziness however, no chest pain/pressure or no swelling. Please advise.

## 2021-04-24 ENCOUNTER — Ambulatory Visit (HOSPITAL_COMMUNITY)
Admission: RE | Admit: 2021-04-24 | Discharge: 2021-04-24 | Disposition: A | Discharge status: Home / Self Care | Payer: Medicare HMO | Source: Ambulatory Visit | Attending: Family Medicine | Admitting: Family Medicine

## 2021-04-24 ENCOUNTER — Other Ambulatory Visit: Payer: Self-pay

## 2021-04-24 ENCOUNTER — Telehealth: Payer: Self-pay | Admitting: *Deleted

## 2021-04-24 ENCOUNTER — Encounter (HOSPITAL_COMMUNITY): Payer: Self-pay

## 2021-04-24 ENCOUNTER — Ambulatory Visit: Payer: Medicare HMO | Admitting: *Deleted

## 2021-04-24 ENCOUNTER — Telehealth (HOSPITAL_COMMUNITY): Payer: Self-pay | Admitting: *Deleted

## 2021-04-24 ENCOUNTER — Other Ambulatory Visit (HOSPITAL_COMMUNITY): Payer: Self-pay

## 2021-04-24 VITALS — BP 118/80 | HR 76 | Wt 119.8 lb

## 2021-04-24 DIAGNOSIS — I4892 Unspecified atrial flutter: Secondary | ICD-10-CM | POA: Diagnosis not present

## 2021-04-24 DIAGNOSIS — Z5181 Encounter for therapeutic drug level monitoring: Secondary | ICD-10-CM | POA: Diagnosis not present

## 2021-04-24 DIAGNOSIS — R627 Adult failure to thrive: Secondary | ICD-10-CM

## 2021-04-24 DIAGNOSIS — Z79899 Other long term (current) drug therapy: Secondary | ICD-10-CM | POA: Insufficient documentation

## 2021-04-24 DIAGNOSIS — Z7901 Long term (current) use of anticoagulants: Secondary | ICD-10-CM

## 2021-04-24 DIAGNOSIS — Z952 Presence of prosthetic heart valve: Secondary | ICD-10-CM | POA: Diagnosis not present

## 2021-04-24 DIAGNOSIS — N184 Chronic kidney disease, stage 4 (severe): Secondary | ICD-10-CM

## 2021-04-24 DIAGNOSIS — I059 Rheumatic mitral valve disease, unspecified: Secondary | ICD-10-CM | POA: Diagnosis not present

## 2021-04-24 DIAGNOSIS — I5042 Chronic combined systolic (congestive) and diastolic (congestive) heart failure: Secondary | ICD-10-CM | POA: Insufficient documentation

## 2021-04-24 DIAGNOSIS — R42 Dizziness and giddiness: Secondary | ICD-10-CM | POA: Insufficient documentation

## 2021-04-24 DIAGNOSIS — G4733 Obstructive sleep apnea (adult) (pediatric): Secondary | ICD-10-CM | POA: Diagnosis not present

## 2021-04-24 DIAGNOSIS — Z8719 Personal history of other diseases of the digestive system: Secondary | ICD-10-CM

## 2021-04-24 DIAGNOSIS — I130 Hypertensive heart and chronic kidney disease with heart failure and stage 1 through stage 4 chronic kidney disease, or unspecified chronic kidney disease: Secondary | ICD-10-CM | POA: Diagnosis not present

## 2021-04-24 DIAGNOSIS — Z87891 Personal history of nicotine dependence: Secondary | ICD-10-CM | POA: Diagnosis not present

## 2021-04-24 DIAGNOSIS — I2781 Cor pulmonale (chronic): Secondary | ICD-10-CM

## 2021-04-24 DIAGNOSIS — R0602 Shortness of breath: Secondary | ICD-10-CM | POA: Insufficient documentation

## 2021-04-24 DIAGNOSIS — I5032 Chronic diastolic (congestive) heart failure: Secondary | ICD-10-CM

## 2021-04-24 DIAGNOSIS — I712 Thoracic aortic aneurysm, without rupture, unspecified: Secondary | ICD-10-CM

## 2021-04-24 LAB — COMPREHENSIVE METABOLIC PANEL
ALT: 44 U/L (ref 0–44)
AST: 169 U/L — ABNORMAL HIGH (ref 15–41)
Albumin: 3.7 g/dL (ref 3.5–5.0)
Alkaline Phosphatase: 64 U/L (ref 38–126)
Anion gap: 8 (ref 5–15)
BUN: 45 mg/dL — ABNORMAL HIGH (ref 8–23)
CO2: 25 mmol/L (ref 22–32)
Calcium: 9.5 mg/dL (ref 8.9–10.3)
Chloride: 104 mmol/L (ref 98–111)
Creatinine, Ser: 3.05 mg/dL — ABNORMAL HIGH (ref 0.61–1.24)
GFR, Estimated: 22 mL/min — ABNORMAL LOW (ref >60)
Glucose, Bld: 95 mg/dL (ref 70–99)
Potassium: 4.9 mmol/L (ref 3.5–5.1)
Sodium: 137 mmol/L (ref 135–145)
Total Bilirubin: 3.3 mg/dL — ABNORMAL HIGH (ref 0.3–1.2)
Total Protein: 8.8 g/dL — ABNORMAL HIGH (ref 6.5–8.1)

## 2021-04-24 LAB — POCT INR: INR: 4.8 — AB (ref 2.0–3.0)

## 2021-04-24 LAB — CBC
HCT: 28.6 % — ABNORMAL LOW (ref 39.0–52.0)
Hemoglobin: 8.9 g/dL — ABNORMAL LOW (ref 13.0–17.0)
MCH: 32.4 pg (ref 26.0–34.0)
MCHC: 31.1 g/dL (ref 30.0–36.0)
MCV: 104 fL — ABNORMAL HIGH (ref 80.0–100.0)
Platelets: 87 10*3/uL — ABNORMAL LOW (ref 150–400)
RBC: 2.75 MIL/uL — ABNORMAL LOW (ref 4.22–5.81)
RDW: 32.2 % — ABNORMAL HIGH (ref 11.5–15.5)
WBC: 4.2 10*3/uL (ref 4.0–10.5)
nRBC: 0 % (ref 0.0–0.2)

## 2021-04-24 LAB — IRON AND TIBC
Iron: 101 ug/dL (ref 45–182)
Saturation Ratios: 30 % (ref 17.9–39.5)
TIBC: 340 ug/dL (ref 250–450)
UIBC: 239 ug/dL

## 2021-04-24 LAB — FERRITIN: Ferritin: 118 ng/mL (ref 24–336)

## 2021-04-24 LAB — BRAIN NATRIURETIC PEPTIDE: B Natriuretic Peptide: 1444.9 pg/mL — ABNORMAL HIGH (ref 0.0–100.0)

## 2021-04-24 MED ORDER — FUROSEMIDE 40 MG PO TABS: 20.0000 mg | ORAL_TABLET | Freq: Every day | ORAL | 0 refills | Status: DC

## 2021-04-24 MED ORDER — FUROSEMIDE 40 MG PO TABS: 60.0000 mg | ORAL_TABLET | Freq: Every day | ORAL | 0 refills | Status: DC

## 2021-04-24 NOTE — Telephone Encounter (Signed)
-----   Message from Rafael Bihari, [place] sent at 04/24/2021  4:29 PM EST ----- Iron studies look ok. Plt are low and AST and total Bili are elevated. These all appear consistent with baseline when compared with labs 3/22.  Kidney function mildly elevated from baseline however BNP (fluid marker is up) indicating volume overload.  Please increase Lasix to 60 mg daily. Keep KCL suppl as directed.   Repeat BMET/BNP in 1 week.

## 2021-04-24 NOTE — Telephone Encounter (Signed)
Pt aware, agreeable, and verbalized understanding, labs sch 1/26

## 2021-04-24 NOTE — Telephone Encounter (Signed)
-----   Message from Scarlette Calico, RN sent at 04/24/2021 12:29 PM EST ----- Pt is sch for a RHC on 1/31 with Dr Haroldine Laws and will need Lovenox Bridge set up please, I see his INR was high today, thanks

## 2021-04-24 NOTE — Telephone Encounter (Signed)
Spoke w/pt, appt sch for today at 11:30

## 2021-04-24 NOTE — Telephone Encounter (Signed)
Called pt since received a message pt needs to be bridged for upcoming right heart cath. He is aware that he will need to come in to get detailed instructions and have INR checked again. Pt was not very excited about having to do lovenox injections explained that anytime he comes off warfarin he has to use the injections to prevent him from having a clot or stroke.He does not have any syringes left from previous bridge. He stated to set the appt and he will be there. Confirmed appt date and time and he is aware to call if any conflicts.

## 2021-04-24 NOTE — Patient Instructions (Signed)
Thank you for coming in today  Labs were done today, if any labs are abnormal the clinic will call you  DECREASE Lasix to 20 mg 1/2 tablet daily   Your physician recommends that you schedule a follow-up appointment in:  2-3 weeks after heart cath  You are scheduled for Cardiac Catheterization on 05/05/2021 with Dr. Haroldine Laws  Please arrive at the [facility] at 5:30 am on the day of your procedure.  DIET ___ Nothing to eat or drink after midnight except your medications with a sip of water.    MAKE SURE YOU TAKE YOUR ASPIRIN.  YOU MAY TAKE ALL of your remaining medications with a small amount of water.   Plan for one night stay - bring personal belongings (i.e. toothpaste, toothbrush, etc.)  Bring a current list of your medications and current insurance cards.  Must have a responsible person to drive you home.  Someone must be with you for the first 24 hours after you arrive home.   Please wear clothes that are easy to get on and off and wear slip-on shoes.  * Special note: Every effort is made to have your procedure done on time. Occasionally there are emergencies that present themselves at the hospital that may cause delays. Please be patient if a delay does occur.  If you have any questions after you get home, please call the office at the number listed above.   At the [facility], you and your health needs are our priority. As part of our continuing mission to provide you with exceptional heart care, we have created designated Provider Care Teams. These Care Teams include your primary Cardiologist (physician) and Advanced Practice Providers (APPs- Physician Assistants and Nurse Practitioners) who all work together to provide you with the care you need, when you need it.   You may see any of the following providers on your designated Care Team at your next follow up: Dr Glori Bickers Dr Haynes Kerns, NP Lyda Jester, [place] [facility] [place], [place] Audry Riles, PharmD   Please be sure to bring in all your medications bottles to every appointment.   If you have any questions or concerns before your next appointment please send [place] a message through [place] or call our office at [phone].    TO LEAVE A MESSAGE FOR THE NURSE SELECT OPTION 2, PLEASE LEAVE A MESSAGE INCLUDING: YOUR NAME DATE OF BIRTH CALL BACK NUMBER REASON FOR CALL**this is important as we prioritize the call backs  YOU WILL RECEIVE A CALL BACK THE SAME DAY AS LONG AS YOU CALL BEFORE 4:00 PM

## 2021-04-24 NOTE — Progress Notes (Signed)
ReDS Vest / Clip - 04/24/21 1100       ReDS Vest / Clip   Station Marker C    Ruler Value 24    ReDS Value Range Low volume    ReDS Actual Value 22

## 2021-04-24 NOTE — Patient Instructions (Signed)
Description   -Hold warfarin today.  -START taking warfarin 1 tablet daily except for 1/2 a tablet on Sundays, Tuesdays and Thursdays. Recheck INR in 2 weeks. Coumadin clinic [phone].  -Have extra ensure today

## 2021-04-24 NOTE — Progress Notes (Addendum)
ADVANCED HF CLINIC NOTE   Primary Cardiologist: Dr. Daneen Schick [facility]: Dr. Haroldine Laws   HPI:  [name] is a 71 y.o. male who was initially referred by Dr. Tamala Julian for evalaution of Pulmoanry HTN.   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 Other problems include hepatitis C (s/p curative therapy), chronic atrial flutter, ascending aortic aneurysm 4.5 cm, chronic kidney disease stage III, chronic anemia.     CTA of chest 6/20 neg for PE but dilated main pulmonary artery at 4.5 cm. + emphysema. Ectatic ascending aorta 4.4 cm.    Echo 7/20 EF 60-65%, moderately increased LV wall thickness.  Mod LVH. RV with severely reduced systolic function pressure moderately elevated at 54.9 mmHg mildly dilated aortic root 4.4 cm, mild AI s/p MVR with mean gradient of 5 , severe LAE, severe RV dysfunction mild TR estimated RVSP moderately elevated.    We saw him for the first time on 11/08/18. More SOB, more LE edema. NYHA IIIB symptoms. Underwent RHC to assess need for inotropes (see below).  Admitted 8/21 for GIB after recent polypectomy. EGD 8/16 with colonoscopy-found to have gastric polyps including sigmoid colon polyp 6 mm. Developed self-limiting bleed subsequent to this but 5 days later bleeding restarted despite holding Lovenox. Repeat colonoscopy 11/27/2019 blood in sigmoid colon clips placed at polypectomy sites. Received 3 units of blood.   Not felt to be a candidate for advanced therapies. 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 Not transplant candidate w CKD 4. Not ideal candidate for palliative inotropes given infection risk with mechanical MV.   In February 2022 he presented for outpatient EGD for polypectomy. Post procedure, he was altered and hypotensive. He required intubation for airway protection in the setting of acute  hypercarbic (pH 7.16 / pCO2 68) respiratory failure.  Per report, he had ST-T wave abnormalities.  He was admitted to the ICU for observation. Concern for potential sleep apnea and sleep evaluation was recommended. Cardiology was consulted per family request but nothing further was added.   EF down to 35-40% on echo in June. (Dr. Haroldine Laws reviewed and felt 50-55%). He was last seen for follow-up in July. Volume status appeared okay. Sleep study ordered and found to have severe OSA. Had lexiscan stress MPI 08/03 which revealed fixed apical and apical inferior defect suggestive of artifact and partially reversible basal to mid anterior defect, LVEF 54%.  Follow up 8/22 continued to feel poorly, daytime fatigue and SOB with stairs. Chronic NYHA III, volume looked ok, ReDs 26%.   Today he returns for an acute visit. SOB is worse x 1 week. He has some dizziness, no syncope. New 3 pillow orthopnea. Overall feeling poorly and fatigued. Denies palpitations, abnormal bleeding, CP. Appetite poor, experiencing early satiety. No fever or chills. Weight continues to trend down. Taking all medications. Took metolazone last night.  Not wearing CPAP   Cardiac Studies: - Echo (6/22): EF 35-40%, RV severely reduced, moderate AR, mod/severe PR - Echo (7/20):  EF normal. RV severely dilated and HK. RVSP 55  - RHC (7/21): Moderate mixed PH with moderately depressed cardiac output RA = 12 RV = 65/12 PA = 65/24 (41) PCW = 20 (v-wave 37) Fick cardiac output/index = 3.85/2.13 PVR = 5.5 FA sat = 98% PA sat = 52%, 54% PAPi = 3.4  - RHC (8/20): Moderate mixed pulmonary  HTN with volume overload and moderately to severely reduced CO. No role for selective pulmonary artery vasodialtors at this point with elevated left-sided pressures.       RHC on 11/24/18       RA = 17      RV = 71/17      PA = 71/27 (45)      PCW = 27 (v = 26)      Fick cardiac output/index = 3.7/1.96      PVR = 5.0 WU      Ao sat = 98%      PA  sat = 52%, 54%      PaPI = 3.17  Past Medical History:  Diagnosis Date   Allergy    Aneurysm of ascending aorta    Atrial fibrillation (HCC)    BPH (benign prostatic hyperplasia)    Colon polyps    FHx: rheumatic heart disease    H/O diplopia    Hepatitis C    Hypertension    Hypogonadism male    Mitral valve disease    mitral valve repair/replacement x3   OSA (obstructive sleep apnea) 12/15/2020   Current Outpatient Medications  Medication Sig Dispense Refill   amoxicillin (AMOXIL) 500 MG capsule TAKE 4 CAPSULES BY MOUTH 1 HOUR BEFORE DENTAL APPOINTMENT 4 capsule 1   Efinaconazole 10 % SOLN Apply to toenail daily for 48 weeks 8 mL 3   enoxaparin (LOVENOX) 60 MG/0.6ML injection Inject 0.6 mLs (60 mg total) into the skin daily. 6 mL 1   ferrous sulfate 325 (65 FE) MG tablet Take 1 tablet (325 mg total) by mouth every other day. In the evening. (Patient taking differently: Take 325 mg by mouth every other day. In the evening.)  3   furosemide (LASIX) 40 MG tablet Take 1 tablet (40 mg total) by mouth daily. TAKE 1 TABLET(40 MG) BY MOUTH DAILY 90 tablet 3   hydrOXYzine (ATARAX) 10 MG tablet Take 1 tablet (10 mg total) by mouth 3 (three) times daily as needed. 90 tablet 0   metolazone (ZAROXOLYN) 2.5 MG tablet TAKE AS DIRECTED BY THE HEART FAILURE CLINIC 30 tablet 2   polyethylene glycol (MIRALAX) 17 g packet Take 17 g by mouth daily as needed. 14 each 0   potassium chloride SA (KLOR-CON M) 20 MEQ tablet Take 2 tablets (40 mEq total) by mouth daily. 180 tablet 3   sildenafil (REVATIO) 20 MG tablet Take 20 mg by mouth daily as needed (ED).     spironolactone (ALDACTONE) 25 MG tablet Take 1 tablet (25 mg total) by mouth daily. 90 tablet 3   tadalafil (CIALIS) 5 MG tablet Take 5 mg by mouth daily.     Testosterone 1.62 % GEL Apply 2 Pump topically daily. 1 pump on each arm     warfarin (COUMADIN) 5 MG tablet Take 1/2 a tablet to 1 tablet by mouth daily as directed by the coumadin clinic. 100  tablet 0   No current facility-administered medications for this encounter.   Allergies  Allergen Reactions   Penicillins Rash    Did it involve swelling of the face/tongue/throat, SOB, or low BP? No Did it involve sudden or severe rash/hives, skin peeling, or any reaction on the inside of your mouth or nose? No Did you need to seek medical attention at a hospital or doctor's office? No When did it last happen?      30 + years If all above answers are NO, may proceed with  cephalosporin use.    Smoked < 1/2 ppd x many years. Quit 2-3 years ago  Social History   Socioeconomic History   Marital status: Divorced    Spouse name: Not on file   Number of children: 2   Years of education: 12   Highest education level: High school graduate  Occupational History   Not on file  Tobacco Use   Smoking status: Former    Types: Cigars    Quit date: 09/03/2017    Years since quitting: 3.6   Smokeless tobacco: Never  Vaping Use   Vaping Use: Never used  Substance and Sexual Activity   Alcohol use: No   Drug use: No   Sexual activity: Not Currently  Other Topics Concern   Not on file  Social History Narrative   Patient lives alone in [place].    Patient works out 4x a week on home exercise bike.    Patient plays guitar.    Social Determinants of Health   Financial Resource Strain: Low Risk    Difficulty of Paying Living Expenses: Not hard at all  Food Insecurity: No Food Insecurity   Worried About Charity fundraiser in the Last Year: Never true   [place] in the Last Year: Never true  Transportation Needs: No Transportation Needs   Lack of Transportation (Medical): No   Lack of Transportation (Non-Medical): No  Physical Activity: Insufficiently Active   Days of Exercise per Week: 4 days   Minutes of Exercise per Session: 20 min  Stress: No Stress Concern Present   Feeling of Stress : Only a little  Social Connections: Socially Isolated   Frequency of Communication  with Friends and Family: More than three times a week   Frequency of Social Gatherings with Friends and Family: More than three times a week   Attends Religious Services: Never   Marine scientist or Organizations: No   Attends Music therapist: Never   Marital Status: Divorced  Human resources officer Violence: Not At Risk   Fear of Current or Ex-Partner: No   Emotionally Abused: No   Physically Abused: No   Sexually Abused: No   Fhx: No FHX of premature CAD or SCD. Personally reviewed  BP 118/80    Pulse 76    Wt 54.3 kg (119 lb 12.8 oz)    BMI 15.81 kg/m   Wt Readings from Last 3 Encounters:  04/24/21 54.3 kg (119 lb 12.8 oz)  03/19/21 55.5 kg (122 lb 6.4 oz)  03/13/21 56.1 kg (123 lb 9.6 oz)   PHYSICAL EXAM: General:  NAD. No resp difficulty, cachectic, fatigued-appearing, pale. HEENT: + temporal wasting. Neck: Supple. + v waves, JVP 6-7. Carotids 2+ bilat; no bruits. No lymphadenopathy or thryomegaly appreciated. Cor: PMI nondisplaced. Regular rate & rhythm. No rubs, gallops or murmurs. + mechanical S1 Lungs: Clear Abdomen: Soft, nontender, nondistended. No hepatosplenomegaly. No bruits or masses. Good bowel sounds. Extremities: No cyanosis, clubbing, rash, 1-2+ BLE pre-tibial edema (cool and dry extremities) Neuro: Alert & oriented x 3, cranial nerves grossly intact. Moves all 4 extremities w/o difficulty. Flat pleasant.  ECG (personally reviewed): atrial flutter 80 bpm  ReDs: 22%  ASSESSMENT & PLAN:   1.  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 RV severely dilated and HK. RVSP 55 - RHC 8/20 with significantly elevated biventricular pressures, moderate to severe mixed  PAH and low output (CI 1.96) - M-spike negative. PYP 1/21 negative for amyloid.  - Echo (6/22): newly reduced EF 35-40%, RV severely reduced, moderate AR, mod/severe PR. (Dr.  Haroldine Laws felt EF 50-55%). - Lexiscan stress MPI, 08/22: Partially reversible basal to mid anterior defect consistent w/ infarct and peri-infarct ischemia EF 54% - Worse NYHA IIIb. Mild peripheral edema on exam, ReDs Clip 22%. He looks dry, but has + waves on exam. - Continue compression hose and elevate legs. - Decrease Lasix to 20 mg daily for now. - Continue to take metolazone as needed (carefully). - Continue spiro 25 mg daily. - No Entresto/ digoxin w/ CKD. - Advanced options very limited. 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 Not ideal candidate for palliative inotropes given infection risk with mechanical MV. - Discussed repeating RHC to assess filling pressures and output. He is agreeable.  - CMET/BNP today.  2. 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   - Stable on last echo. - On coumadin, INR followed by Coumadin clinic.  - Aware of need for SBE prophylaxis.    3.  Chronic a flutter  - Rate controlled, on Coumadin.    4.  Thoracic aneurysm, followed by Dr. Cyndia Bent  - CT chest 6/20 asc ao aorta 4.4 cm.   - Not surgical candidate.  5. Stage IV CKD - Followed by nephrology. - Baseline SCr~2.4. - Labs today.  6. 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 Negative for high-grade dysplasia/carcinoma - Followed routinely by GI. - Denies any recent gross bleeding.  - Check CBC, TIBC/Iron and ferratin.  7. OSA - Severe, newly diagnosed.  - Awaiting CPAP titration.  - Dr. Radford Pax following.   8. FTT - Previously working with a nutritionist. - Nearing end-stage RV failure.  - Need GOC discussion soon, will discuss at follow up after [place].  Follow up with APP after RHC. He will need Lovenox bridge before cath. Will have [facility]mdain Clinic give recs.  [place], FNP  04/24/21.

## 2021-04-24 NOTE — Addendum Note (Signed)
Encounter addended by: Rafael Bihari, FNP on: 04/24/2021 4:36 PM  Actions taken: Clinical Note Signed

## 2021-04-24 NOTE — H&P (View-Only) (Signed)
ADVANCED HF CLINIC NOTE   Primary Cardiologist: Dr. Daneen Schick [facility]: Dr. Haroldine Laws   HPI:  [name] is a 71 y.o. male who was initially referred by Dr. Tamala Julian for evalaution of Pulmoanry HTN.   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 Other problems include hepatitis C (s/p curative therapy), chronic atrial flutter, ascending aortic aneurysm 4.5 cm, chronic kidney disease stage III, chronic anemia.     CTA of chest 6/20 neg for PE but dilated main pulmonary artery at 4.5 cm. + emphysema. Ectatic ascending aorta 4.4 cm.    Echo 7/20 EF 60-65%, moderately increased LV wall thickness.  Mod LVH. RV with severely reduced systolic function pressure moderately elevated at 54.9 mmHg mildly dilated aortic root 4.4 cm, mild AI s/p MVR with mean gradient of 5 , severe LAE, severe RV dysfunction mild TR estimated RVSP moderately elevated.    We saw him for the first time on 11/08/18. More SOB, more LE edema. NYHA IIIB symptoms. Underwent RHC to assess need for inotropes (see below).  Admitted 8/21 for GIB after recent polypectomy. EGD 8/16 with colonoscopy-found to have gastric polyps including sigmoid colon polyp 6 mm. Developed self-limiting bleed subsequent to this but 5 days later bleeding restarted despite holding Lovenox. Repeat colonoscopy 11/27/2019 blood in sigmoid colon clips placed at polypectomy sites. Received 3 units of blood.   Not felt to be a candidate for advanced therapies. 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 Not transplant candidate w CKD 4. Not ideal candidate for palliative inotropes given infection risk with mechanical MV.   In February 2022 he presented for outpatient EGD for polypectomy. Post procedure, he was altered and hypotensive. He required intubation for airway protection in the setting of acute  hypercarbic (pH 7.16 / pCO2 68) respiratory failure.  Per report, he had ST-T wave abnormalities.  He was admitted to the ICU for observation. Concern for potential sleep apnea and sleep evaluation was recommended. Cardiology was consulted per family request but nothing further was added.   EF down to 35-40% on echo in June. (Dr. Haroldine Laws reviewed and felt 50-55%). He was last seen for follow-up in July. Volume status appeared okay. Sleep study ordered and found to have severe OSA. Had lexiscan stress MPI 08/03 which revealed fixed apical and apical inferior defect suggestive of artifact and partially reversible basal to mid anterior defect, LVEF 54%.  Follow up 8/22 continued to feel poorly, daytime fatigue and SOB with stairs. Chronic NYHA III, volume looked ok, ReDs 26%.   Today he returns for an acute visit. SOB is worse x 1 week. He has some dizziness, no syncope. New 3 pillow orthopnea. Overall feeling poorly and fatigued. Denies palpitations, abnormal bleeding, CP. Appetite poor, experiencing early satiety. No fever or chills. Weight continues to trend down. Taking all medications. Took metolazone last night.  Not wearing CPAP   Cardiac Studies: - Echo (6/22): EF 35-40%, RV severely reduced, moderate AR, mod/severe PR - Echo (7/20):  EF normal. RV severely dilated and HK. RVSP 55  - RHC (7/21): Moderate mixed PH with moderately depressed cardiac output RA = 12 RV = 65/12 PA = 65/24 (41) PCW = 20 (v-wave 37) Fick cardiac output/index = 3.85/2.13 PVR = 5.5 FA sat = 98% PA sat = 52%, 54% PAPi = 3.4  - RHC (8/20): Moderate mixed pulmonary  HTN with volume overload and moderately to severely reduced CO. No role for selective pulmonary artery vasodialtors at this point with elevated left-sided pressures.       RHC on 11/24/18       RA = 17      RV = 71/17      PA = 71/27 (45)      PCW = 27 (v = 43)      Fick cardiac output/index = 3.7/1.96      PVR = 5.0 WU      Ao sat = 98%      PA  sat = 52%, 54%      PaPI = 3.17  Past Medical History:  Diagnosis Date   Allergy    Aneurysm of ascending aorta    Atrial fibrillation (HCC)    BPH (benign prostatic hyperplasia)    Colon polyps    FHx: rheumatic heart disease    H/O diplopia    Hepatitis C    Hypertension    Hypogonadism male    Mitral valve disease    mitral valve repair/replacement x3   OSA (obstructive sleep apnea) 12/15/2020   Current Outpatient Medications  Medication Sig Dispense Refill   amoxicillin (AMOXIL) 500 MG capsule TAKE 4 CAPSULES BY MOUTH 1 HOUR BEFORE DENTAL APPOINTMENT 4 capsule 1   Efinaconazole 10 % SOLN Apply to toenail daily for 48 weeks 8 mL 3   enoxaparin (LOVENOX) 60 MG/0.6ML injection Inject 0.6 mLs (60 mg total) into the skin daily. 6 mL 1   ferrous sulfate 325 (65 FE) MG tablet Take 1 tablet (325 mg total) by mouth every other day. In the evening. (Patient taking differently: Take 325 mg by mouth every other day. In the evening.)  3   furosemide (LASIX) 40 MG tablet Take 1 tablet (40 mg total) by mouth daily. TAKE 1 TABLET(40 MG) BY MOUTH DAILY 90 tablet 3   hydrOXYzine (ATARAX) 10 MG tablet Take 1 tablet (10 mg total) by mouth 3 (three) times daily as needed. 90 tablet 0   metolazone (ZAROXOLYN) 2.5 MG tablet TAKE AS DIRECTED BY THE HEART FAILURE CLINIC 30 tablet 2   polyethylene glycol (MIRALAX) 17 g packet Take 17 g by mouth daily as needed. 14 each 0   potassium chloride SA (KLOR-CON M) 20 MEQ tablet Take 2 tablets (40 mEq total) by mouth daily. 180 tablet 3   sildenafil (REVATIO) 20 MG tablet Take 20 mg by mouth daily as needed (ED).     spironolactone (ALDACTONE) 25 MG tablet Take 1 tablet (25 mg total) by mouth daily. 90 tablet 3   tadalafil (CIALIS) 5 MG tablet Take 5 mg by mouth daily.     Testosterone 1.62 % GEL Apply 2 Pump topically daily. 1 pump on each arm     warfarin (COUMADIN) 5 MG tablet Take 1/2 a tablet to 1 tablet by mouth daily as directed by the coumadin clinic. 100  tablet 0   No current facility-administered medications for this encounter.   Allergies  Allergen Reactions   Penicillins Rash    Did it involve swelling of the face/tongue/throat, SOB, or low BP? No Did it involve sudden or severe rash/hives, skin peeling, or any reaction on the inside of your mouth or nose? No Did you need to seek medical attention at a hospital or doctor's office? No When did it last happen?      30 + years If all above answers are NO, may proceed with  cephalosporin use.    Smoked < 1/2 ppd x many years. Quit 2-3 years ago  Social History   Socioeconomic History   Marital status: Divorced    Spouse name: Not on file   Number of children: 2   Years of education: 12   Highest education level: High school graduate  Occupational History   Not on file  Tobacco Use   Smoking status: Former    Types: Cigars    Quit date: 09/03/2017    Years since quitting: 3.6   Smokeless tobacco: Never  Vaping Use   Vaping Use: Never used  Substance and Sexual Activity   Alcohol use: No   Drug use: No   Sexual activity: Not Currently  Other Topics Concern   Not on file  Social History Narrative   Patient lives alone in [place].    Patient works out 4x a week on home exercise bike.    Patient plays guitar.    Social Determinants of Health   Financial Resource Strain: Low Risk    Difficulty of Paying Living Expenses: Not hard at all  Food Insecurity: No Food Insecurity   Worried About Charity fundraiser in the Last Year: Never true   [place] in the Last Year: Never true  Transportation Needs: No Transportation Needs   Lack of Transportation (Medical): No   Lack of Transportation (Non-Medical): No  Physical Activity: Insufficiently Active   Days of Exercise per Week: 4 days   Minutes of Exercise per Session: 20 min  Stress: No Stress Concern Present   Feeling of Stress : Only a little  Social Connections: Socially Isolated   Frequency of Communication  with Friends and Family: More than three times a week   Frequency of Social Gatherings with Friends and Family: More than three times a week   Attends Religious Services: Never   Marine scientist or Organizations: No   Attends Music therapist: Never   Marital Status: Divorced  Human resources officer Violence: Not At Risk   Fear of Current or Ex-Partner: No   Emotionally Abused: No   Physically Abused: No   Sexually Abused: No   Fhx: No FHX of premature CAD or SCD. Personally reviewed  BP 118/80    Pulse 76    Wt 54.3 kg (119 lb 12.8 oz)    BMI 15.81 kg/m   Wt Readings from Last 3 Encounters:  04/24/21 54.3 kg (119 lb 12.8 oz)  03/19/21 55.5 kg (122 lb 6.4 oz)  03/13/21 56.1 kg (123 lb 9.6 oz)   PHYSICAL EXAM: General:  NAD. No resp difficulty, cachectic, fatigued-appearing, pale. HEENT: + temporal wasting. Neck: Supple. + v waves, JVP 6-7. Carotids 2+ bilat; no bruits. No lymphadenopathy or thryomegaly appreciated. Cor: PMI nondisplaced. Regular rate & rhythm. No rubs, gallops or murmurs. + mechanical S1 Lungs: Clear Abdomen: Soft, nontender, nondistended. No hepatosplenomegaly. No bruits or masses. Good bowel sounds. Extremities: No cyanosis, clubbing, rash, 1-2+ BLE pre-tibial edema (cool and dry extremities) Neuro: Alert & oriented x 3, cranial nerves grossly intact. Moves all 4 extremities w/o difficulty. Flat pleasant.  ECG (personally reviewed): atrial flutter 80 bpm  ReDs: 22%  ASSESSMENT & PLAN:   1.  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 RV severely dilated and HK. RVSP 55 - RHC 8/20 with significantly elevated biventricular pressures, moderate to severe mixed  PAH and low output (CI 1.96) - M-spike negative. PYP 1/21 negative for amyloid.  - Echo (6/22): newly reduced EF 35-40%, RV severely reduced, moderate AR, mod/severe PR. (Dr.  Haroldine Laws felt EF 50-55%). - Lexiscan stress MPI, 08/22: Partially reversible basal to mid anterior defect consistent w/ infarct and peri-infarct ischemia EF 54% - Worse NYHA IIIb. Mild peripheral edema on exam, ReDs Clip 22%. He looks dry, but has + waves on exam. - Continue compression hose and elevate legs. - Decrease Lasix to 20 mg daily for now. - Continue to take metolazone as needed (carefully). - Continue spiro 25 mg daily. - No Entresto/ digoxin w/ CKD. - Advanced options very limited. 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 Not ideal candidate for palliative inotropes given infection risk with mechanical MV. - Discussed repeating RHC to assess filling pressures and output. He is agreeable.  - CMET/BNP today.  2. 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   - Stable on last echo. - On coumadin, INR followed by Coumadin clinic.  - Aware of need for SBE prophylaxis.    3.  Chronic a flutter  - Rate controlled, on Coumadin.    4.  Thoracic aneurysm, followed by Dr. Cyndia Bent  - CT chest 6/20 asc ao aorta 4.4 cm.   - Not surgical candidate.  5. Stage IV CKD - Followed by nephrology. - Baseline SCr~2.4. - Labs today.  6. 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 Negative for high-grade dysplasia/carcinoma - Followed routinely by GI. - Denies any recent gross bleeding.  - Check CBC, TIBC/Iron and ferratin.  7. OSA - Severe, newly diagnosed.  - Awaiting CPAP titration.  - Dr. Radford Pax following.   8. FTT - Previously working with a nutritionist. - Nearing end-stage RV failure.  - Need GOC discussion soon, will discuss at follow up after [place].  Follow up with APP after RHC. He will need Lovenox bridge before cath. Will have [facility] give recs.  [place], FNP  04/24/21.

## 2021-04-28 ENCOUNTER — Other Ambulatory Visit: Payer: Self-pay

## 2021-04-28 ENCOUNTER — Ambulatory Visit: Payer: Medicare HMO

## 2021-04-28 DIAGNOSIS — Z5181 Encounter for therapeutic drug level monitoring: Secondary | ICD-10-CM | POA: Diagnosis not present

## 2021-04-28 DIAGNOSIS — I4892 Unspecified atrial flutter: Secondary | ICD-10-CM | POA: Diagnosis not present

## 2021-04-28 DIAGNOSIS — I059 Rheumatic mitral valve disease, unspecified: Secondary | ICD-10-CM

## 2021-04-28 LAB — POCT INR: INR: 3.2 — AB (ref 2.0–3.0)

## 2021-04-28 NOTE — Patient Instructions (Signed)
Description   Start taking Warfarin 1/2 tablet daily except 1 tablet on Mondays, Wednesdays and Fridays.  R heart catheterization on 05/05/21 INR 2.0-3.0 per Dr Haroldine Laws.  Recheck INR in 1 week post procedure. Coumadin clinic [phone].

## 2021-04-28 NOTE — Telephone Encounter (Signed)
Per Marcelle Overlie, pharmacist she discussed Lovenox bridging this pt further with Dr Haroldine Laws and given his poor kidney function (last Creat 3.05 on 04/24/21) and hx of bleeding Dr Haroldine Laws will cath pt on Warfarin.  Would like INR to be 2.0-3.0.  Will see pt today in office to check INR and adjust dosage prior to cath if necessary.

## 2021-04-30 ENCOUNTER — Ambulatory Visit (HOSPITAL_COMMUNITY)
Admission: RE | Admit: 2021-04-30 | Discharge: 2021-04-30 | Disposition: A | Discharge status: Home / Self Care | Payer: Medicare HMO | Source: Ambulatory Visit | Attending: Internal Medicine | Admitting: Internal Medicine

## 2021-04-30 ENCOUNTER — Other Ambulatory Visit: Payer: Self-pay

## 2021-04-30 DIAGNOSIS — I5042 Chronic combined systolic (congestive) and diastolic (congestive) heart failure: Secondary | ICD-10-CM | POA: Insufficient documentation

## 2021-04-30 DIAGNOSIS — N184 Chronic kidney disease, stage 4 (severe): Secondary | ICD-10-CM | POA: Insufficient documentation

## 2021-04-30 LAB — BASIC METABOLIC PANEL
Anion gap: 8 (ref 5–15)
BUN: 49 mg/dL — ABNORMAL HIGH (ref 8–23)
CO2: 26 mmol/L (ref 22–32)
Calcium: 8.9 mg/dL (ref 8.9–10.3)
Chloride: 100 mmol/L (ref 98–111)
Creatinine, Ser: 3.16 mg/dL — ABNORMAL HIGH (ref 0.61–1.24)
GFR, Estimated: 21 mL/min — ABNORMAL LOW (ref >60)
Glucose, Bld: 94 mg/dL (ref 70–99)
Potassium: 3.5 mmol/L (ref 3.5–5.1)
Sodium: 134 mmol/L — ABNORMAL LOW (ref 135–145)

## 2021-04-30 LAB — BRAIN NATRIURETIC PEPTIDE: B Natriuretic Peptide: 898.1 pg/mL — ABNORMAL HIGH (ref 0.0–100.0)

## 2021-05-04 DIAGNOSIS — Z7901 Long term (current) use of anticoagulants: Secondary | ICD-10-CM | POA: Diagnosis not present

## 2021-05-04 DIAGNOSIS — R040 Epistaxis: Secondary | ICD-10-CM | POA: Diagnosis not present

## 2021-05-05 ENCOUNTER — Encounter (HOSPITAL_COMMUNITY): Payer: Self-pay | Admitting: Internal Medicine

## 2021-05-05 ENCOUNTER — Encounter (HOSPITAL_COMMUNITY)
Admission: AD | Disposition: A | Discharge status: Hospice | Payer: Self-pay | Source: Home / Self Care | Attending: Internal Medicine

## 2021-05-05 ENCOUNTER — Other Ambulatory Visit: Payer: Self-pay

## 2021-05-05 ENCOUNTER — Inpatient Hospital Stay (HOSPITAL_COMMUNITY)
Admission: AD | Admit: 2021-05-05 | Discharge: 2021-05-25 | DRG: 252 | Disposition: A | Discharge status: Hospice | Payer: Medicare HMO | Attending: Internal Medicine | Admitting: Internal Medicine

## 2021-05-05 ENCOUNTER — Inpatient Hospital Stay (HOSPITAL_COMMUNITY): Payer: Medicare HMO

## 2021-05-05 DIAGNOSIS — I5032 Chronic diastolic (congestive) heart failure: Secondary | ICD-10-CM

## 2021-05-05 DIAGNOSIS — E871 Hypo-osmolality and hyponatremia: Secondary | ICD-10-CM | POA: Diagnosis not present

## 2021-05-05 DIAGNOSIS — R040 Epistaxis: Secondary | ICD-10-CM | POA: Diagnosis present

## 2021-05-05 DIAGNOSIS — I493 Ventricular premature depolarization: Secondary | ICD-10-CM | POA: Diagnosis not present

## 2021-05-05 DIAGNOSIS — R64 Cachexia: Secondary | ICD-10-CM | POA: Diagnosis present

## 2021-05-05 DIAGNOSIS — I2729 Other secondary pulmonary hypertension: Secondary | ICD-10-CM | POA: Diagnosis present

## 2021-05-05 DIAGNOSIS — N189 Chronic kidney disease, unspecified: Secondary | ICD-10-CM | POA: Diagnosis not present

## 2021-05-05 DIAGNOSIS — I5082 Biventricular heart failure: Secondary | ICD-10-CM | POA: Diagnosis present

## 2021-05-05 DIAGNOSIS — E872 Acidosis, unspecified: Secondary | ICD-10-CM | POA: Diagnosis not present

## 2021-05-05 DIAGNOSIS — Z7901 Long term (current) use of anticoagulants: Secondary | ICD-10-CM

## 2021-05-05 DIAGNOSIS — N184 Chronic kidney disease, stage 4 (severe): Secondary | ICD-10-CM | POA: Diagnosis present

## 2021-05-05 DIAGNOSIS — I4892 Unspecified atrial flutter: Secondary | ICD-10-CM | POA: Diagnosis present

## 2021-05-05 DIAGNOSIS — I50810 Right heart failure, unspecified: Secondary | ICD-10-CM | POA: Diagnosis not present

## 2021-05-05 DIAGNOSIS — N179 Acute kidney failure, unspecified: Secondary | ICD-10-CM | POA: Diagnosis not present

## 2021-05-05 DIAGNOSIS — N400 Enlarged prostate without lower urinary tract symptoms: Secondary | ICD-10-CM | POA: Diagnosis present

## 2021-05-05 DIAGNOSIS — J439 Emphysema, unspecified: Secondary | ICD-10-CM | POA: Diagnosis present

## 2021-05-05 DIAGNOSIS — Z20822 Contact with and (suspected) exposure to covid-19: Secondary | ICD-10-CM | POA: Diagnosis not present

## 2021-05-05 DIAGNOSIS — I7121 Aneurysm of the ascending aorta, without rupture: Secondary | ICD-10-CM | POA: Diagnosis present

## 2021-05-05 DIAGNOSIS — Z66 Do not resuscitate: Secondary | ICD-10-CM | POA: Diagnosis not present

## 2021-05-05 DIAGNOSIS — G4733 Obstructive sleep apnea (adult) (pediatric): Secondary | ICD-10-CM | POA: Diagnosis present

## 2021-05-05 DIAGNOSIS — I5023 Acute on chronic systolic (congestive) heart failure: Secondary | ICD-10-CM | POA: Diagnosis not present

## 2021-05-05 DIAGNOSIS — T508X5A Adverse effect of diagnostic agents, initial encounter: Secondary | ICD-10-CM | POA: Diagnosis not present

## 2021-05-05 DIAGNOSIS — Z880 Allergy status to penicillin: Secondary | ICD-10-CM

## 2021-05-05 DIAGNOSIS — R34 Anuria and oliguria: Secondary | ICD-10-CM | POA: Diagnosis not present

## 2021-05-05 DIAGNOSIS — Z515 Encounter for palliative care: Secondary | ICD-10-CM | POA: Diagnosis not present

## 2021-05-05 DIAGNOSIS — R54 Age-related physical debility: Secondary | ICD-10-CM | POA: Diagnosis present

## 2021-05-05 DIAGNOSIS — I5084 End stage heart failure: Secondary | ICD-10-CM | POA: Diagnosis present

## 2021-05-05 DIAGNOSIS — Z681 Body mass index (BMI) 19 or less, adult: Secondary | ICD-10-CM

## 2021-05-05 DIAGNOSIS — E43 Unspecified severe protein-calorie malnutrition: Secondary | ICD-10-CM | POA: Insufficient documentation

## 2021-05-05 DIAGNOSIS — I712 Thoracic aortic aneurysm, without rupture, unspecified: Secondary | ICD-10-CM | POA: Diagnosis present

## 2021-05-05 DIAGNOSIS — R627 Adult failure to thrive: Secondary | ICD-10-CM | POA: Diagnosis present

## 2021-05-05 DIAGNOSIS — I2781 Cor pulmonale (chronic): Secondary | ICD-10-CM | POA: Diagnosis present

## 2021-05-05 DIAGNOSIS — Z952 Presence of prosthetic heart valve: Secondary | ICD-10-CM | POA: Diagnosis not present

## 2021-05-05 DIAGNOSIS — N1411 Contrast-induced nephropathy: Secondary | ICD-10-CM | POA: Diagnosis not present

## 2021-05-05 DIAGNOSIS — D631 Anemia in chronic kidney disease: Secondary | ICD-10-CM | POA: Diagnosis present

## 2021-05-05 DIAGNOSIS — E876 Hypokalemia: Secondary | ICD-10-CM | POA: Diagnosis not present

## 2021-05-05 DIAGNOSIS — R82998 Other abnormal findings in urine: Secondary | ICD-10-CM | POA: Diagnosis not present

## 2021-05-05 DIAGNOSIS — I509 Heart failure, unspecified: Secondary | ICD-10-CM | POA: Diagnosis not present

## 2021-05-05 DIAGNOSIS — Z7189 Other specified counseling: Secondary | ICD-10-CM | POA: Diagnosis not present

## 2021-05-05 DIAGNOSIS — Z8619 Personal history of other infectious and parasitic diseases: Secondary | ICD-10-CM

## 2021-05-05 DIAGNOSIS — Z87891 Personal history of nicotine dependence: Secondary | ICD-10-CM

## 2021-05-05 DIAGNOSIS — I959 Hypotension, unspecified: Secondary | ICD-10-CM | POA: Diagnosis not present

## 2021-05-05 DIAGNOSIS — Z79899 Other long term (current) drug therapy: Secondary | ICD-10-CM

## 2021-05-05 DIAGNOSIS — I130 Hypertensive heart and chronic kidney disease with heart failure and stage 1 through stage 4 chronic kidney disease, or unspecified chronic kidney disease: Secondary | ICD-10-CM | POA: Diagnosis not present

## 2021-05-05 DIAGNOSIS — R319 Hematuria, unspecified: Secondary | ICD-10-CM | POA: Diagnosis not present

## 2021-05-05 DIAGNOSIS — D649 Anemia, unspecified: Secondary | ICD-10-CM | POA: Diagnosis not present

## 2021-05-05 DIAGNOSIS — D61818 Other pancytopenia: Secondary | ICD-10-CM | POA: Diagnosis present

## 2021-05-05 DIAGNOSIS — Z7902 Long term (current) use of antithrombotics/antiplatelets: Secondary | ICD-10-CM

## 2021-05-05 DIAGNOSIS — I129 Hypertensive chronic kidney disease with stage 1 through stage 4 chronic kidney disease, or unspecified chronic kidney disease: Secondary | ICD-10-CM | POA: Diagnosis not present

## 2021-05-05 HISTORY — PX: RIGHT HEART CATH: CATH118263

## 2021-05-05 LAB — POCT I-STAT EG7
Acid-Base Excess: 0 mmol/L (ref 0.0–2.0)
Acid-Base Excess: 1 mmol/L (ref 0.0–2.0)
Bicarbonate: 25.7 mmol/L (ref 20.0–28.0)
Bicarbonate: 26.4 mmol/L (ref 20.0–28.0)
Calcium, Ion: 1.21 mmol/L (ref 1.15–1.40)
Calcium, Ion: 1.22 mmol/L (ref 1.15–1.40)
HCT: 29 % — ABNORMAL LOW (ref 39.0–52.0)
HCT: 29 % — ABNORMAL LOW (ref 39.0–52.0)
Hemoglobin: 9.9 g/dL — ABNORMAL LOW (ref 13.0–17.0)
Hemoglobin: 9.9 g/dL — ABNORMAL LOW (ref 13.0–17.0)
O2 Saturation: 42 %
O2 Saturation: 44 %
Potassium: 3.5 mmol/L (ref 3.5–5.1)
Potassium: 3.5 mmol/L (ref 3.5–5.1)
Sodium: 138 mmol/L (ref 135–145)
Sodium: 139 mmol/L (ref 135–145)
TCO2: 27 mmol/L (ref 22–32)
TCO2: 28 mmol/L (ref 22–32)
pCO2, Ven: 43.6 mmHg — ABNORMAL LOW (ref 44.0–60.0)
pCO2, Ven: 44.2 mmHg (ref 44.0–60.0)
pH, Ven: 7.379 (ref 7.250–7.430)
pH, Ven: 7.384 (ref 7.250–7.430)
pO2, Ven: 24 mmHg — CL (ref 32.0–45.0)
pO2, Ven: 25 mmHg — CL (ref 32.0–45.0)

## 2021-05-05 LAB — PROTIME-INR
INR: 3.7 — ABNORMAL HIGH (ref 0.8–1.2)
Prothrombin Time: 37 seconds — ABNORMAL HIGH (ref 11.4–15.2)

## 2021-05-05 LAB — CBC
HCT: 27.8 % — ABNORMAL LOW (ref 39.0–52.0)
Hemoglobin: 8.7 g/dL — ABNORMAL LOW (ref 13.0–17.0)
MCH: 32.6 pg (ref 26.0–34.0)
MCHC: 31.3 g/dL (ref 30.0–36.0)
MCV: 104.1 fL — ABNORMAL HIGH (ref 80.0–100.0)
Platelets: 76 10*3/uL — ABNORMAL LOW (ref 150–400)
RBC: 2.67 MIL/uL — ABNORMAL LOW (ref 4.22–5.81)
WBC: 3.8 10*3/uL — ABNORMAL LOW (ref 4.0–10.5)
nRBC: 0 % (ref 0.0–0.2)

## 2021-05-05 LAB — BASIC METABOLIC PANEL
Anion gap: 9 (ref 5–15)
BUN: 51 mg/dL — ABNORMAL HIGH (ref 8–23)
CO2: 24 mmol/L (ref 22–32)
Calcium: 8.9 mg/dL (ref 8.9–10.3)
Chloride: 101 mmol/L (ref 98–111)
Creatinine, Ser: 3.24 mg/dL — ABNORMAL HIGH (ref 0.61–1.24)
GFR, Estimated: 20 mL/min — ABNORMAL LOW (ref >60)
Glucose, Bld: 100 mg/dL — ABNORMAL HIGH (ref 70–99)
Potassium: 3.4 mmol/L — ABNORMAL LOW (ref 3.5–5.1)
Sodium: 134 mmol/L — ABNORMAL LOW (ref 135–145)

## 2021-05-05 LAB — URIC ACID: Uric Acid, Serum: 7.4 mg/dL (ref 3.7–8.6)

## 2021-05-05 LAB — ECHOCARDIOGRAM COMPLETE
AR max vel: 4.5 cm2
AV Peak grad: 2.8 mmHg
Ao pk vel: 0.83 m/s
Area-P 1/2: 4.12 cm2
Height: 73 in
MV VTI: 1.22 cm2
S' Lateral: 2.85 cm
Weight: 1904 oz

## 2021-05-05 LAB — MAGNESIUM: Magnesium: 2.4 mg/dL (ref 1.7–2.4)

## 2021-05-05 LAB — SARS CORONAVIRUS 2 (TAT 6-24 HRS): SARS Coronavirus 2: NEGATIVE

## 2021-05-05 SURGERY — RIGHT HEART CATH
Anesthesia: LOCAL

## 2021-05-05 MED ORDER — SPIRONOLACTONE 25 MG PO TABS
25.0000 mg | ORAL_TABLET | Freq: Every day | ORAL | Status: DC
  Filled 2021-05-05: qty 1

## 2021-05-05 MED ORDER — HYDRALAZINE HCL 20 MG/ML IJ SOLN: 10.0000 mg | INTRAMUSCULAR | Status: AC | PRN

## 2021-05-05 MED ORDER — SALINE SPRAY 0.65 % NA SOLN
1.0000 | NASAL | Status: DC | PRN
  Administered 2021-05-07: 1 via NASAL
  Filled 2021-05-05: qty 44

## 2021-05-05 MED ORDER — FUROSEMIDE 10 MG/ML IJ SOLN
INTRAMUSCULAR | Status: AC
  Filled 2021-05-05: qty 8

## 2021-05-05 MED ORDER — SODIUM CHLORIDE 0.9 % IV SOLN: INTRAVENOUS | Status: DC

## 2021-05-05 MED ORDER — MILRINONE LACTATE IN DEXTROSE 20-5 MG/100ML-% IV SOLN
0.1250 ug/kg/min | INTRAVENOUS | Status: DC
  Administered 2021-05-05 – 2021-05-06 (×2): 0.125 ug/kg/min via INTRAVENOUS
  Filled 2021-05-05 (×3): qty 100

## 2021-05-05 MED ORDER — SODIUM CHLORIDE 0.9% FLUSH: 3.0000 mL | INTRAVENOUS | Status: DC | PRN

## 2021-05-05 MED ORDER — SODIUM CHLORIDE 0.9% FLUSH: 3.0000 mL | Freq: Two times a day (BID) | INTRAVENOUS | Status: DC

## 2021-05-05 MED ORDER — POLYETHYLENE GLYCOL 3350 17 G PO PACK
17.0000 g | PACK | Freq: Every day | ORAL | Status: DC
  Administered 2021-05-06 – 2021-05-23 (×13): 17 g via ORAL
  Filled 2021-05-05 (×18): qty 1

## 2021-05-05 MED ORDER — LIDOCAINE HCL (PF) 1 % IJ SOLN
INTRAMUSCULAR | Status: DC | PRN
  Administered 2021-05-05: 1 mL

## 2021-05-05 MED ORDER — SODIUM CHLORIDE 0.9 % IV SOLN: 250.0000 mL | INTRAVENOUS | Status: DC | PRN

## 2021-05-05 MED ORDER — ENSURE ENLIVE PO LIQD
237.0000 mL | Freq: Two times a day (BID) | ORAL | Status: DC
  Administered 2021-05-06: 237 mL via ORAL

## 2021-05-05 MED ORDER — SILDENAFIL CITRATE 20 MG PO TABS: 20.0000 mg | ORAL_TABLET | Freq: Every day | ORAL | Status: DC | PRN

## 2021-05-05 MED ORDER — HEPARIN (PORCINE) IN NACL 1000-0.9 UT/500ML-% IV SOLN
INTRAVENOUS | Status: DC | PRN
  Administered 2021-05-05: 500 mL

## 2021-05-05 MED ORDER — SODIUM CHLORIDE 0.9% FLUSH
3.0000 mL | INTRAVENOUS | Status: DC | PRN
  Administered 2021-05-21: 3 mL via INTRAVENOUS

## 2021-05-05 MED ORDER — WARFARIN - PHARMACIST DOSING INPATIENT: Freq: Every day | Status: DC

## 2021-05-05 MED ORDER — LABETALOL HCL 5 MG/ML IV SOLN: 10.0000 mg | INTRAVENOUS | Status: DC | PRN

## 2021-05-05 MED ORDER — FUROSEMIDE 10 MG/ML IJ SOLN
80.0000 mg | Freq: Two times a day (BID) | INTRAMUSCULAR | Status: DC
  Administered 2021-05-05 – 2021-05-08 (×6): 80 mg via INTRAVENOUS
  Filled 2021-05-05 (×6): qty 8

## 2021-05-05 MED ORDER — SODIUM CHLORIDE 0.9% FLUSH
3.0000 mL | Freq: Two times a day (BID) | INTRAVENOUS | Status: DC
  Administered 2021-05-05 – 2021-05-24 (×33): 3 mL via INTRAVENOUS

## 2021-05-05 MED ORDER — OXYMETAZOLINE HCL 0.05 % NA SOLN
1.0000 | Freq: Two times a day (BID) | NASAL | Status: DC | PRN
  Administered 2021-05-05: 1 via NASAL
  Filled 2021-05-05: qty 30

## 2021-05-05 MED ORDER — POTASSIUM CHLORIDE CRYS ER 20 MEQ PO TBCR
40.0000 meq | EXTENDED_RELEASE_TABLET | Freq: Every day | ORAL | Status: DC
  Administered 2021-05-05 – 2021-05-06 (×2): 40 meq via ORAL
  Filled 2021-05-05 (×2): qty 2

## 2021-05-05 MED ORDER — ONDANSETRON HCL 4 MG/2ML IJ SOLN
4.0000 mg | Freq: Four times a day (QID) | INTRAMUSCULAR | Status: DC | PRN
  Administered 2021-05-25: 4 mg via INTRAVENOUS
  Filled 2021-05-05: qty 2

## 2021-05-05 MED ORDER — LIDOCAINE HCL (PF) 1 % IJ SOLN
INTRAMUSCULAR | Status: AC
  Filled 2021-05-05: qty 30

## 2021-05-05 MED ORDER — ACETAMINOPHEN 325 MG PO TABS
650.0000 mg | ORAL_TABLET | ORAL | Status: DC | PRN
  Administered 2021-05-05 – 2021-05-23 (×13): 650 mg via ORAL
  Filled 2021-05-05 (×16): qty 2

## 2021-05-05 MED ORDER — HEPARIN (PORCINE) IN NACL 1000-0.9 UT/500ML-% IV SOLN
INTRAVENOUS | Status: AC
  Filled 2021-05-05: qty 500

## 2021-05-05 SURGICAL SUPPLY — 5 items
CATH BALLN WEDGE 5F 110CM (CATHETERS) ×1 IMPLANT
GUIDEWIRE .025 260CM (WIRE) ×1 IMPLANT
PACK CARDIAC CATHETERIZATION (CUSTOM PROCEDURE TRAY) ×2 IMPLANT
SHEATH GLIDE SLENDER 4/5FR (SHEATH) ×1 IMPLANT
TRANSDUCER W/STOPCOCK (MISCELLANEOUS) ×2 IMPLANT

## 2021-05-05 NOTE — H&P (Addendum)
Advanced Heart Failure Team History and Physical Note   PCP:  Sharion Settler, DO  PCP-Cardiology: Sinclair Grooms, MD     Reason for Admission:    HPI:   [name] is a 71 y.o. male who was initially referred by Dr. Tamala Julian for evalaution of Pulmoanry HTN.    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 Other problems include hepatitis C (s/p curative therapy), chronic atrial flutter, ascending aortic aneurysm 4.5 cm, chronic kidney disease stage III, chronic anemia.     CTA of chest 6/20 neg for PE but dilated main pulmonary artery at 4.5 cm. + emphysema. Ectatic ascending aorta 4.4 cm.    Echo 7/20 EF 60-65%, moderately increased LV wall thickness.  Mod LVH. RV with severely reduced systolic function pressure moderately elevated at 54.9 mmHg mildly dilated aortic root 4.4 cm, mild AI s/p MVR with mean gradient of 5 , severe LAE, severe RV dysfunction mild TR estimated RVSP moderately elevated.    We saw him for the first time on 11/08/18. More SOB, more LE edema. NYHA IIIB symptoms. Underwent RHC to assess need for inotropes (see below).   Admitted 8/21 for GIB after recent polypectomy. EGD 8/16 with colonoscopy-found to have gastric polyps including sigmoid colon polyp 6 mm. Developed self-limiting bleed subsequent to this but 5 days later bleeding restarted despite holding Lovenox. Repeat colonoscopy 11/27/2019 blood in sigmoid colon clips placed at polypectomy sites. Received 3 units of blood.    Not felt to be a candidate for advanced therapies. 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 Not transplant candidate w CKD 4. Not ideal candidate for palliative inotropes given infection risk with mechanical MV.    In February 2022 he presented for outpatient EGD for polypectomy. Post procedure, he was altered and  hypotensive. He required intubation for airway protection in the setting of acute hypercarbic (pH 7.16 / pCO2 68) respiratory failure.  Per report, he had ST-T wave abnormalities.  He was admitted to the ICU for observation. Concern for potential sleep apnea and sleep evaluation was recommended. Cardiology was consulted per family request but nothing further was added.    EF down to 35-40% on echo in June. (Dr. Haroldine Laws reviewed and felt 50-55%). He was last seen for follow-up in July. Volume status appeared okay. Sleep study ordered and found to have severe OSA. Had lexiscan stress MPI 08/03 which revealed fixed apical and apical inferior defect suggestive of artifact and partially reversible basal to mid anterior defect, LVEF 54%.   Follow up 8/22 continued to feel poorly, daytime fatigue and SOB with stairs. Chronic NYHA III, volume looked ok, ReDs 26%.    Seen for acute visit 04/24/21. Noted worsening dyspnea and orthopnea. Volume difficult on exam.  Scheduled for RHC this morning.  RHC today: RA mean 15 PAP 70/29 (45) PCWP 20 (V 35) CO 3.51 CI 2.03 PA sat 43%  Will admit for IV diuresis and add milrinone for RV support. Had prominent v waves on PCWP tracing. May need TEE to assess mitral valve prosthesis  Review of Systems: [y] = yes, [ ]  = no   General: Weight gain [ ] ; Weight loss [ ] ; Anorexia [ ] ; Fatigue [ ] ; Fever [ ] ; Chills [ ] ; Weakness [Y]  Cardiac: Chest pain/pressure [ ] ; Resting SOB [ ] ; Exertional SOB [Y]; Orthopnea [  Y]; Pedal Edema [Y]; Palpitations [ ] ; Syncope [ ] ; Presyncope [ ] ; Paroxysmal nocturnal dyspnea[ ]   Pulmonary: Cough [ ] ; Wheezing[ ] ; Hemoptysis[ ] ; Sputum [ ] ; Snoring [ ]   GI: Vomiting[ ] ; Dysphagia[ ] ; Melena[ ] ; Hematochezia [ ] ; Heartburn[ ] ; Abdominal pain [ ] ; Constipation [ ] ; Diarrhea [ ] ; BRBPR [ ]   GU: Hematuria[ ] ; Dysuria [ ] ; Nocturia[ ]   Vascular: Pain in legs with walking [ ] ; Pain in feet with lying flat [ ] ; Non-healing sores [ ] ; Stroke [  ]; TIA [ ] ; Slurred speech [ ] ;  Neuro: Headaches[ ] ; Vertigo[ ] ; Seizures[ ] ; Paresthesias[ ] ;Blurred vision [ ] ; Diplopia [ ] ; Vision changes [ ]   Ortho/Skin: Arthritis [ ] ; Joint pain [ ] ; Muscle pain [ ] ; Joint swelling [ ] ; Back Pain [ ] ; Rash [ ]   Psych: Depression[ ] ; Anxiety[ ]   Heme: Bleeding problems [Y]; Clotting disorders [ ] ; Anemia [ ]   Endocrine: Diabetes [ ] ; Thyroid dysfunction[ ]    Home Medications Prior to Admission medications   Medication Sig Start Date End Date Taking? Authorizing Provider  Efinaconazole 10 % SOLN Apply to toenail daily for 48 weeks 06/17/20  Yes [name], MD  ferrous sulfate 325 (65 FE) MG tablet Take 1 tablet (325 mg total) by mouth every other day. In the evening. Patient taking differently: Take 325 mg by mouth every other day. In the evening. 12/02/19  Yes Nita Sells, MD  furosemide (LASIX) 40 MG tablet Take 1.5 tablets (60 mg total) by mouth daily. Patient taking differently: Take 40 mg by mouth daily. 04/24/21  Yes Milford, Maricela Bo, FNP  hydrOXYzine (ATARAX) 10 MG tablet Take 1 tablet (10 mg total) by mouth 3 (three) times daily as needed. 03/19/21  Yes Simmons-Robinson, Makiera, MD  metolazone (ZAROXOLYN) 2.5 MG tablet TAKE AS DIRECTED BY THE HEART FAILURE CLINIC Patient taking differently: Take 2.5 mg by mouth daily as needed (Fluid). Take as directed by the heart failure clinic 11/25/20  Yes Jora Galluzzo, Shaune Pascal, MD  Multiple Vitamins-Minerals (MULTIVITAMIN WITH MINERALS) tablet Take 1 tablet by mouth daily.   Yes [provider]  polyethylene glycol (MIRALAX) 17 g packet Take 17 g by mouth daily as needed. 08/28/19  Yes Armbruster, Carlota Raspberry, MD  potassium chloride SA (KLOR-CON M) 20 MEQ tablet Take 2 tablets (40 mEq total) by mouth daily. 03/11/21  Yes Larey Dresser, MD  sildenafil (REVATIO) 20 MG tablet Take 20 mg by mouth daily as needed (ED). 11/13/19  Yes [provider]  Testosterone 1.62 % GEL Apply 2  Pump topically daily. 1 pump on each arm 03/05/20  Yes [provider]  warfarin (COUMADIN) 5 MG tablet Take 1/2 a tablet to 1 tablet by mouth daily as directed by the coumadin clinic. Patient taking differently: Take 2.5-5 mg by mouth See admin instructions. Take 2.5 mg on Thursday, Saturday and Sunday all the other days take 5 mg as directed by the coumadin clinic. 04/15/21  Yes Belva Crome, MD  amoxicillin (AMOXIL) 500 MG capsule TAKE 4 CAPSULES BY MOUTH 1 HOUR BEFORE DENTAL APPOINTMENT 03/06/20   Belva Crome, MD  enoxaparin (LOVENOX) 60 MG/0.6ML injection Inject 0.6 mLs (60 mg total) into the skin daily. 04/24/20   Belva Crome, MD  spironolactone (ALDACTONE) 25 MG tablet Take 1 tablet (25 mg total) by mouth daily. Patient not taking: Reported on 04/29/2021 12/01/20   Aleta Manternach, Shaune Pascal, MD    Past Medical History: Past Medical History:  Diagnosis Date   Allergy    Aneurysm of ascending aorta    Atrial fibrillation (HCC)    BPH (benign prostatic hyperplasia)    Colon polyps    FHx: rheumatic heart disease    H/O diplopia    Hepatitis C    Hypertension    Hypogonadism male    Mitral valve disease    mitral valve repair/replacement x3   OSA (obstructive sleep apnea) 12/15/2020    Past Surgical History: Past Surgical History:  Procedure Laterality Date   BIOPSY  11/19/2019   Procedure: BIOPSY;  Surgeon: Yetta Flock, MD;  Location: [facility];  Service: Gastroenterology;;  EGD and COLON   COLON SURGERY     COLONOSCOPY     COLONOSCOPY N/A 11/14/2012   Procedure: COLONOSCOPY;  Surgeon: Lear Ng, MD;  Location: [facility];  Service: Endoscopy;  Laterality: N/A;   COLONOSCOPY WITH PROPOFOL N/A 11/19/2019   Procedure: COLONOSCOPY WITH PROPOFOL;  Surgeon: Yetta Flock, MD;  Location: [facility];  Service: Gastroenterology;  Laterality: N/A;   COLONOSCOPY WITH PROPOFOL N/A 11/27/2019   Procedure: COLONOSCOPY WITH PROPOFOL;  Surgeon: Doran Stabler, MD;  Location: [facility];  Service: Gastroenterology;  Laterality: N/A;   ESOPHAGOGASTRODUODENOSCOPY (EGD) WITH PROPOFOL N/A 05/08/2014   Procedure: ESOPHAGOGASTRODUODENOSCOPY (EGD) WITH PROPOFOL;  Surgeon: Lear Ng, MD;  Location: [facility];  Service: Endoscopy;  Laterality: N/A;   ESOPHAGOGASTRODUODENOSCOPY (EGD) WITH PROPOFOL N/A 11/19/2019   Procedure: ESOPHAGOGASTRODUODENOSCOPY (EGD) WITH PROPOFOL;  Surgeon: Yetta Flock, MD;  Location: [facility];  Service: Gastroenterology;  Laterality: N/A;   ESOPHAGOGASTRODUODENOSCOPY (EGD) WITH PROPOFOL N/A 05/07/2020   Procedure: ESOPHAGOGASTRODUODENOSCOPY (EGD) WITH PROPOFOL;  Surgeon: Yetta Flock, MD;  Location: [facility];  Service: Gastroenterology;  Laterality: N/A;   HEMOSTASIS CLIP PLACEMENT  11/19/2019   Procedure: HEMOSTASIS CLIP PLACEMENT;  Surgeon: Yetta Flock, MD;  Location: [facility];  Service: Gastroenterology;;   HEMOSTASIS CLIP PLACEMENT  11/27/2019   Procedure: HEMOSTASIS CLIP PLACEMENT;  Surgeon: Doran Stabler, MD;  Location: [facility];  Service: Gastroenterology;;   HEMOSTASIS CLIP PLACEMENT  05/07/2020   Procedure: HEMOSTASIS CLIP PLACEMENT;  Surgeon: Yetta Flock, MD;  Location: [facility];  Service: Gastroenterology;;   MITRAL VALVE REPLACEMENT     1973 (bioprosthesis), 1988 ([employer] Jude mechanical vlave) , 2006 (repair due to leak)   POLYPECTOMY  11/19/2019   Procedure: POLYPECTOMY;  Surgeon: Yetta Flock, MD;  Location: [facility];  Service: Gastroenterology;;   POLYPECTOMY  05/07/2020   Procedure: POLYPECTOMY;  Surgeon: Yetta Flock, MD;  Location: [facility];  Service: Gastroenterology;;   RIGHT HEART CATH N/A 11/24/2018   Procedure: RIGHT HEART CATH;  Surgeon: Jolaine Artist, MD;  Location: [facility];  Service: Cardiovascular;  Laterality: N/A;   RIGHT HEART CATH N/A 10/23/2019   Procedure: RIGHT HEART CATH;  Surgeon: Jolaine Artist, MD;  Location: [facility];  Service: Cardiovascular;  Laterality: N/A;   SUBMUCOSAL LIFTING INJECTION  05/07/2020   Procedure: SUBMUCOSAL LIFTING INJECTION;  Surgeon: Yetta Flock, MD;  Location: [facility];  Service: Gastroenterology;;   UPPER GASTROINTESTINAL ENDOSCOPY     02-2010 and 05-2014    Family History:  Family History  Family history unknown: Yes    Social History: Social History   Socioeconomic History   Marital status: Divorced    Spouse name: Not on file   Number of children: 2   Years of education: 47  Highest education level: High school graduate  Occupational History   Not on file  Tobacco Use   Smoking status: Former    Types: Cigars    Quit date: 09/03/2017    Years since quitting: 3.6   Smokeless tobacco: Never  Vaping Use   Vaping Use: Never used  Substance and Sexual Activity   Alcohol use: No   Drug use: No   Sexual activity: Not Currently  Other Topics Concern   Not on file  Social History Narrative   Patient lives alone in [place].    Patient works out 4x a week on home exercise bike.    Patient plays guitar.    Social Determinants of Health   Financial Resource Strain: Low Risk    Difficulty of Paying Living Expenses: Not hard at all  Food Insecurity: No Food Insecurity   Worried About Charity fundraiser in the Last Year: Never true   [place] in the Last Year: Never true  Transportation Needs: No Transportation Needs   Lack of Transportation (Medical): No   Lack of Transportation (Non-Medical): No  Physical Activity: Insufficiently Active   Days of Exercise per Week: 4 days   Minutes of Exercise per Session: 20 min  Stress: No Stress Concern Present   Feeling of Stress : Only a little  Social Connections: Socially Isolated   Frequency of Communication with Friends and Family: More than three times a week   Frequency of Social Gatherings with Friends and Family: More than three times a week    Attends Religious Services: Never   Marine scientist or Organizations: No   Attends Archivist Meetings: Never   Marital Status: Divorced    Allergies:  Allergies  Allergen Reactions   Penicillins Rash    Did it involve swelling of the face/tongue/throat, SOB, or low BP? No Did it involve sudden or severe rash/hives, skin peeling, or any reaction on the inside of your mouth or nose? No Did you need to seek medical attention at a hospital or doctor's office? No When did it last happen?      30 + years If all above answers are NO, may proceed with cephalosporin use.     Objective:    Vital Signs:   Temp:  [97.5 F (36.4 C)] 97.5 F (36.4 C) (01/31 0531) Pulse Rate:  [77-78] 78 (01/31 0813) Resp:  [15] 15 (01/31 0813) BP: (103-116)/(56-71) 116/56 (01/31 0813) SpO2:  [100 %] 100 % (01/31 0531) Weight:  [54 kg] 54 kg (01/31 0531)   Filed Weights   05/05/21 0531  Weight: 54 kg     Physical Exam     General:  Thin, chronically ill appearing.  HEENT: + temporal wasting Neck: Supple. + JVP to jaw. Carotids 2+ bilat; no bruits.  Cor: PMI nondisplaced. Irregular rhythm, rate 80s. + mechanical S1. No rubs, gallops, murmurs.  Lungs: Clear Abdomen: Soft, nontender, nondistended. No hepatosplenomegaly. No bruits or masses. Good bowel sounds. Extremities: No cyanosis, clubbing, rash, 2+ edema Neuro: Alert & oriented x 3, cranial nerves grossly intact. moves all 4 extremities w/o difficulty. Affect pleasant.   EKG   01/20: Aflutter 80 bpm  Labs     Basic Metabolic Panel: Recent Labs  Lab 04/30/21 1235  NA 134*  K 3.5  CL 100  CO2 26  GLUCOSE 94  BUN 49*  CREATININE 3.16*  CALCIUM 8.9    Liver Function Tests: No results for input(s): AST, ALT,  ALKPHOS, BILITOT, PROT, ALBUMIN in the last 168 hours. No results for input(s): LIPASE, AMYLASE in the last 168 hours. No results for input(s): AMMONIA in the last 168 hours.  CBC: No results for  input(s): WBC, NEUTROABS, HGB, HCT, MCV, PLT in the last 168 hours.  Cardiac Enzymes: No results for input(s): CKTOTAL, CKMB, CKMBINDEX, TROPONINI in the last 168 hours.  BNP: BNP (last 3 results) Recent Labs    04/24/21 1231 04/30/21 1235  BNP 1,444.9* 898.1*    ProBNP (last 3 results) Recent Labs    07/25/20 1036  PROBNP 8,332*     CBG: No results for input(s): GLUCAP in the last 168 hours.  Coagulation Studies: Recent Labs    05/05/21 0600  LABPROT 37.0*  INR 3.7*    Imaging: No results found.   Patient Profile   71 y.o. 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 Admitted with acute on chronic CHF.  Assessment/Plan   1.  Acute on chronic systolic CHF with low output/valvular HF with mixed moderate to severe PH/cor pulmonale in setting of longstanding MV disease - Echo 7/20  LVEF normal. RV severely dilated and HK. RVSP 55 - RHC 8/20 with significantly elevated biventricular pressures, moderate to severe mixed PAH and low output (CI 1.96) - M-spike negative. PYP 1/21 negative for amyloid.  - Echo (6/22): newly reduced EF 35-40%, RV severely reduced, moderate AR, mod/severe PR. (Dr. Haroldine Laws felt EF 50-55%). - Lexiscan stress MPI, 08/22: Partially reversible basal to mid anterior defect consistent w/ infarct and peri-infarct ischemia EF 54% - RHC today with elevated filling pressures and low output. CO 2.03. PA sat 43%. Prominent v waves on PCWP tracing. May need TEE to assess mitral valve prosthesis. Repeat TTE. Starting milrinone 0.25 for RV support. Diurese with IV lasix.  - Hold spiro with AKI on CKD (last Scr 3.16 5 days ago) - No Entresto/ digoxin w/ CKD. - Daily BMET including today - Advanced options very limited. 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 Not ideal candidate for home palliative inotropes given infection risk with mechanical MV.   2. Mechanical mitral  valve with surgery X 3, last replacement 2012.   - Echo 06/22: EF 35-40%, RV severely reduced, mechanical mitral valve with trivial MR - Repeat echo - On coumadin, INR followed by Coumadin clinic.  - Aware of need for SBE prophylaxis.    3.  Chronic a flutter  - Rate controlled, on Coumadin.    4.  Thoracic aneurysm, followed by Dr. Cyndia Bent  - CT chest 6/20 asc ao aorta 4.4 cm.   - Not surgical candidate.   5. AKI on Stage IV CKD - Followed by nephrology. - Baseline SCr~2.4. - Labs today. Scr 3.16 5 days ago. Suspect cardiorenal   6. 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 Negative for high-grade dysplasia/carcinoma - Followed routinely by GI. - Denies any recent gross bleeding.    7. OSA - Severe, newly diagnosed.  - Awaiting CPAP titration.  - Dr. Radford Pax following.    8. FTT - Previously working with a nutritionist. - Nearing end-stage RV failure.  - Likely need GOC discussion soon   [facility], Lynder Parents, PA-C 05/05/2021, 8:17 AM  Advanced Heart Failure Team Pager [phone] (M-F; 7a - 5p)  Please contact [facility] for night-coverage after hours (4p -7a ) and weekends on [email or web address]  Patient seen and examined with the above-signed Advanced Practice Provider  and/or Housestaff. I personally reviewed laboratory data, imaging studies and relevant notes. I independently examined the patient and formulated the important aspects of the plan. I have edited the note to reflect any of my changes or salient points. I have personally discussed the plan with the patient and/or family.  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   Recently with worsening HF symptoms. NYHA IIIB. RHC today with elevated filling pressures and low output.   General:  Cachectic No resp difficulty HEENT: normal Neck: supple. JVP to ear Carotids 2+ bilat; no bruits. No lymphadenopathy or thryomegaly appreciated. Cor: PMI nondisplaced. Regular rate & rhythm.mech  s1 Lungs: clear Abdomen: soft, nontender, nondistended. No hepatosplenomegaly. No bruits or masses. Good bowel sounds. Extremities: no cyanosis, clubbing, rash, 1+ edema Neuro: alert & orientedx3, cranial nerves grossly intact. moves all 4 extremities w/o difficulty. Affect pleasant  He has end-stage biventricular HF. Will admit for milrinone tune-up and IV diuresis. Not candidate for advanced therapies with cachexia and 3 previous sternotomies. Do not favor home milrinone due to risk of MVR if he were to get a PICC.  Glori Bickers, MD  11:25 PM

## 2021-05-05 NOTE — Interval H&P Note (Signed)
History and Physical Interval Note:  05/05/2021 11:21 PM  [name]  has presented today for surgery, with the diagnosis of heart failure.  The various methods of treatment have been discussed with the patient and family. After consideration of risks, benefits and other options for treatment, the patient has consented to  Procedure(s): RIGHT HEART CATH (N/A) as a surgical intervention.  The patient's history has been reviewed, patient examined, no change in status, stable for surgery.  I have reviewed the patient's chart and labs.  Questions were answered to the patient's satisfaction.     [name]

## 2021-05-05 NOTE — Progress Notes (Signed)
Pt states that he's been nose bleeding on and off all day long. He started to have foot & ankle cramping around 1:40 pm. Cramping got better after he was marching in the room.

## 2021-05-05 NOTE — Progress Notes (Signed)
Pt with c/o nose bleed, no active bleeding noted, small amount of sanguineous drainage noted on tissue, will continue to monitor, safety maintained, pt informed not to "pick" or "irritate nose, verbal acknowledgement noted

## 2021-05-05 NOTE — Progress Notes (Signed)
ANTICOAGULATION CONSULT NOTE - Initial Consult  Pharmacy Consult for warfarin Indication:  mechanical mitral valve, chronic aflutter  Allergies  Allergen Reactions   Penicillins Rash    Did it involve swelling of the face/tongue/throat, SOB, or low BP? No Did it involve sudden or severe rash/hives, skin peeling, or any reaction on the inside of your mouth or nose? No Did you need to seek medical attention at a hospital or doctor's office? No When did it last happen?      30 + years If all above answers are NO, may proceed with cephalosporin use.     Patient Measurements: Height: 6\' 1"  (185.4 cm) Weight: 54 kg (119 lb) IBW/kg (Calculated) : 79.9  Vital Signs: Temp: 97.6 F (36.4 C) (01/31 1205) Temp Source: Oral (01/31 1205) BP: 105/70 (01/31 1205) Pulse Rate: 78 (01/31 0813)  Labs: Recent Labs    05/05/21 0600  LABPROT 37.0*  INR 3.7*    Estimated Creatinine Clearance: 17.1 mL/min (A) (by C-G formula based on SCr of 3.16 mg/dL (H)).   Medical History: Past Medical History:  Diagnosis Date   Allergy    Aneurysm of ascending aorta    Atrial fibrillation (HCC)    BPH (benign prostatic hyperplasia)    Colon polyps    FHx: rheumatic heart disease    H/O diplopia    Hepatitis C    Hypertension    Hypogonadism male    Mitral valve disease    mitral valve repair/replacement x3   OSA (obstructive sleep apnea) 12/15/2020    Assessment: 71 year old male with history of mitral valve replacement x 3, currently with mechanical valve. Patient being admitted after RHC. INR elevated at 3.7. No cbc done. No hematoma noted post cath.   Warfarin dosing prior to admit was 5mg  MWF and 2.5mg  all other days, which was adjusted for cath. Prior visit shows regimen as 2.5mg  on Sun/Thur and 5mg  all other days.   Received call from Heart failure team this afternoon, patient is having nose bleeds. Will order nasal spray. With elevated INR will hold warfarin tonight.   Goal of  Therapy:  INR goal 2.5-3.5 Monitor platelets by anticoagulation protocol: Yes   Plan:  Hold warfarin tonight Daily INR   Erin Hearing PharmD., BCPS Clinical Pharmacist 05/05/2021 12:25 PM

## 2021-05-05 NOTE — Interval H&P Note (Signed)
History and Physical Interval Note:  05/05/2021 7:37 AM  [name]  has presented today for surgery, with the diagnosis of heart failure.  The various methods of treatment have been discussed with the patient and family. After consideration of risks, benefits and other options for treatment, the patient has consented to  Procedure(s): RIGHT HEART CATH (N/A) as a surgical intervention.  The patient's history has been reviewed, patient examined, no change in status, stable for surgery.  I have reviewed the patient's chart and labs.  Questions were answered to the patient's satisfaction.     Demaria Deeney

## 2021-05-05 NOTE — Progress Notes (Signed)
Patient having nosebleeds off and on this afternoon. Reports this has been an intermittent problem. Has seen ENT in past for this. INR 3.7. today. Pharm D dosing coumadin. Uses nasal sprays at home, will order for here.  Also noting bilateral ankle pain, R > L, as well as cramps. Check uric acid although suspicion for gout not that high.  BMET, magnesium, CBC this afternoon.

## 2021-05-05 NOTE — Progress Notes (Signed)
Patient arrived onto the unit from cath lab. Patient tele monitor applied and CCMD notified. Patient c/o of nose bleed. 2 cm blood clot from nasal cavity lying on tissue. Primary RN notified.

## 2021-05-05 NOTE — Progress Notes (Signed)
Jennifer,RN notified of client voided brownish red urine and she notified Dr Haroldine Laws

## 2021-05-05 NOTE — Progress Notes (Signed)
Pt stated that right nose bleeding eased off but also said he started left nose bleeding too this evening. It was scant bleeding. Pt was also complaining of foot cramps again.  Pt stood up and marching whenever he has leg cramps cuz it eased off the cramps. Pt asked for Tylenol for foot cramps.

## 2021-05-05 NOTE — Progress Notes (Signed)
Echocardiogram 2D Echocardiogram has been performed.  Jefferey Pica 05/05/2021, 2:30 PM

## 2021-05-06 DIAGNOSIS — I50810 Right heart failure, unspecified: Secondary | ICD-10-CM | POA: Diagnosis not present

## 2021-05-06 DIAGNOSIS — E43 Unspecified severe protein-calorie malnutrition: Secondary | ICD-10-CM | POA: Insufficient documentation

## 2021-05-06 LAB — COMPREHENSIVE METABOLIC PANEL
ALT: 38 U/L (ref 0–44)
AST: 155 U/L — ABNORMAL HIGH (ref 15–41)
Albumin: 3.3 g/dL — ABNORMAL LOW (ref 3.5–5.0)
Alkaline Phosphatase: 66 U/L (ref 38–126)
Anion gap: 9 (ref 5–15)
BUN: 48 mg/dL — ABNORMAL HIGH (ref 8–23)
CO2: 24 mmol/L (ref 22–32)
Calcium: 8.8 mg/dL — ABNORMAL LOW (ref 8.9–10.3)
Chloride: 100 mmol/L (ref 98–111)
Creatinine, Ser: 3.1 mg/dL — ABNORMAL HIGH (ref 0.61–1.24)
GFR, Estimated: 21 mL/min — ABNORMAL LOW (ref >60)
Glucose, Bld: 92 mg/dL (ref 70–99)
Potassium: 2.9 mmol/L — ABNORMAL LOW (ref 3.5–5.1)
Sodium: 133 mmol/L — ABNORMAL LOW (ref 135–145)
Total Bilirubin: 3.2 mg/dL — ABNORMAL HIGH (ref 0.3–1.2)
Total Protein: 7.8 g/dL (ref 6.5–8.1)

## 2021-05-06 LAB — CBC
HCT: 25 % — ABNORMAL LOW (ref 39.0–52.0)
Hemoglobin: 8 g/dL — ABNORMAL LOW (ref 13.0–17.0)
MCH: 32.5 pg (ref 26.0–34.0)
MCHC: 32 g/dL (ref 30.0–36.0)
MCV: 101.6 fL — ABNORMAL HIGH (ref 80.0–100.0)
Platelets: 68 10*3/uL — ABNORMAL LOW (ref 150–400)
RBC: 2.46 MIL/uL — ABNORMAL LOW (ref 4.22–5.81)
RDW: 33.2 % — ABNORMAL HIGH (ref 11.5–15.5)
WBC: 3.6 10*3/uL — ABNORMAL LOW (ref 4.0–10.5)
nRBC: 0 % (ref 0.0–0.2)

## 2021-05-06 LAB — PROTIME-INR
INR: 3.6 — ABNORMAL HIGH (ref 0.8–1.2)
Prothrombin Time: 35.6 seconds — ABNORMAL HIGH (ref 11.4–15.2)

## 2021-05-06 LAB — MAGNESIUM: Magnesium: 2.1 mg/dL (ref 1.7–2.4)

## 2021-05-06 MED ORDER — WARFARIN SODIUM 1 MG PO TABS
1.0000 mg | ORAL_TABLET | Freq: Once | ORAL | Status: AC
  Administered 2021-05-06: 1 mg via ORAL
  Filled 2021-05-06: qty 1

## 2021-05-06 MED ORDER — ENSURE ENLIVE PO LIQD
237.0000 mL | Freq: Three times a day (TID) | ORAL | Status: DC
  Administered 2021-05-07 – 2021-05-13 (×15): 237 mL via ORAL

## 2021-05-06 MED ORDER — MIDODRINE HCL 5 MG PO TABS
5.0000 mg | ORAL_TABLET | Freq: Three times a day (TID) | ORAL | Status: DC
  Administered 2021-05-06 – 2021-05-20 (×40): 5 mg via ORAL
  Filled 2021-05-06 (×44): qty 1

## 2021-05-06 MED ORDER — TRANEXAMIC ACID FOR EPISTAXIS
500.0000 mg | Freq: Once | TOPICAL | Status: AC
  Administered 2021-05-06: 500 mg via TOPICAL
  Filled 2021-05-06: qty 10

## 2021-05-06 MED ORDER — POTASSIUM CHLORIDE CRYS ER 20 MEQ PO TBCR
40.0000 meq | EXTENDED_RELEASE_TABLET | ORAL | Status: AC
  Administered 2021-05-06 (×3): 40 meq via ORAL
  Filled 2021-05-06 (×3): qty 2

## 2021-05-06 MED ORDER — RENA-VITE PO TABS
1.0000 | ORAL_TABLET | Freq: Every day | ORAL | Status: DC
  Administered 2021-05-06 – 2021-05-24 (×19): 1 via ORAL
  Filled 2021-05-06 (×19): qty 1

## 2021-05-06 NOTE — Progress Notes (Signed)
Initial Nutrition Assessment  DOCUMENTATION CODES:   Severe malnutrition in context of chronic illness, Underweight  INTERVENTION:   Recommend liberalizing pt diet to regular due to severe malnutrition. Received ok from MD. Encourage good PO intake Increase Ensure Enlive po TID, each supplement provides 350 kcal and 20 grams of protein. Renal Multivitamin w/ minerals daily  NUTRITION DIAGNOSIS:   Severe Malnutrition related to chronic illness (CHF) as evidenced by severe muscle depletion, severe fat depletion.  GOAL:   Patient will meet greater than or equal to 90% of their needs  MONITOR:   PO intake, Supplement acceptance, Labs, Weight trends  REASON FOR ASSESSMENT:   Malnutrition Screening Tool    ASSESSMENT:   71 y.o. male presented for a R Heart Cath. PMH includes CHF, mitral valve replacement x3, CKD IV, Hep C, and cirrhosis.   Pt reports that his appetite has been poor for ~6 months, states that it has slightly improved. Pt reports that he eats regular foods for meals, typically: Breakfast: eggs, bacon, oatmeal, cereal Lunch/Dinner: sandwich Per EMR, pt ate 80% of lunch and 100% of dinner on 1/31.   Pt reports that he typically drinks 1 Ensure/Boost per day at home. Discussed that we have them ordered for him here to continue to drink. Pt appreciative.   Pt reports that he use to weight 235# about 1 year ago. Pt reports that he just keeps losing weight. Per EMR, pt has had a 13% weight los within 3 months, this is clinically significant and severe for time frame. Although, due to pt chronic conditions unable to determine if weight loss is related to fluid or actual dry weight loss. Pt   Medications reviewed and include: Lasix, Miralax, Potassium Chloride, Warfarin Labs reviewed:  - Sodium 133 - Potassium 2.9 - BUN 48 - Creatinine 3.10  NUTRITION - FOCUSED PHYSICAL EXAM:  Flowsheet Row Most Recent Value  Orbital Region Severe depletion  Upper Arm Region  Severe depletion  Thoracic and Lumbar Region Severe depletion  Buccal Region Severe depletion  Temple Region Severe depletion  Clavicle Bone Region Severe depletion  Clavicle and Acromion Bone Region Severe depletion  Scapular Bone Region Severe depletion  Dorsal Hand Severe depletion  Patellar Region Severe depletion  Anterior Thigh Region Severe depletion  Posterior Calf Region Severe depletion  Edema (RD Assessment) None  Hair Reviewed  Eyes Reviewed  Mouth Reviewed  Skin Reviewed  Nails Reviewed       Diet Order:   Diet Order             Diet Heart Room service appropriate? Yes; Fluid consistency: Thin  Diet effective now                   EDUCATION NEEDS:   No education needs have been identified at this time  Skin:  Skin Assessment: Reviewed RN Assessment  Last BM:  1/31  Height:   Ht Readings from Last 1 Encounters:  05/05/21 6\' 1"  (1.854 m)    Weight:   Wt Readings from Last 1 Encounters:  05/06/21 51.3 kg    Ideal Body Weight:  83.6 kg  BMI:  Body mass index is 14.91 kg/m.  Estimated Nutritional Needs:   Kcal:  1600-1800  Protein:  80-95 grams  Fluid:  >/= 1.6 L    Latrell Potempa Louie Casa, RD, LDN Clinical Dietitian See [facility] for contact information.

## 2021-05-06 NOTE — Progress Notes (Signed)
[place] for warfarin Indication:  mechanical mitral valve, chronic aflutter  Allergies  Allergen Reactions   Penicillins Rash    Did it involve swelling of the face/tongue/throat, SOB, or low BP? No Did it involve sudden or severe rash/hives, skin peeling, or any reaction on the inside of your mouth or nose? No Did you need to seek medical attention at a hospital or doctor's office? No When did it last happen?      30 + years If all above answers are NO, may proceed with cephalosporin use.     Patient Measurements: Height: _0  (185.4 cm) Weight: 51.3 kg (113 lb) (scale c) IBW/kg (Calculated) : 79.9  Vital Signs: Temp: 97.8 F (36.6 C) (02/01 1206) Temp Source: Oral (02/01 1206) BP: 109/69 (02/01 1206) Pulse Rate: 78 (02/01 1206)  Labs: Recent Labs    05/05/21 0600 05/05/21 0754 05/05/21 0754 05/05/21 1506 05/06/21 0645  HGB  --  9.9*   9.9*   < > 8.7* 8.0*  HCT  --  29.0*   29.0*  --  27.8* 25.0*  PLT  --   --   --  76* 68*  LABPROT 37.0*  --   --   --  35.6*  INR 3.7*  --   --   --  3.6*  CREATININE  --   --   --  3.24* 3.10*   < > = values in this interval not displayed.     Estimated Creatinine Clearance: 16.5 mL/min (A) (by C-G formula based on SCr of 3.1 mg/dL (H)).   Medical History: Past Medical History:  Diagnosis Date   Allergy    Aneurysm of ascending aorta    Atrial fibrillation (HCC)    BPH (benign prostatic hyperplasia)    Colon polyps    FHx: rheumatic heart disease    H/O diplopia    Hepatitis C    Hypertension    Hypogonadism male    Mitral valve disease    mitral valve repair/replacement x3   OSA (obstructive sleep apnea) 12/15/2020    Assessment: 71 year old male with history of mitral valve replacement x 3, currently with mechanical valve. Patient being admitted after RHC. INR elevated at 3.7. No cbc done. No hematoma noted post cath.   INR relatively unchanged at 3.6 after holding dose  yesterday. Patient still c/o nose bleeds. Afrin at bedside, will order TXA epistaxis kit. Hgb down 9.9>8.7>8, plt low at baseline, 68 today.   Warfarin dosing prior to admit was 43m MWF and 2.581mall other days, which was adjusted for cath. Prior visit shows regimen as 2.34m634mn Sun/Thur and 34mg51ml other days.   Goal of Therapy:  INR goal 2.5-3.5 Monitor platelets by anticoagulation protocol: Yes   Plan:  Give low dose warfarin tonight to prevent rapid drop -1mg 42mly INR  Follow up on resolution of nose bleeds  FrankErin HearingmD., BCPS Clinical Pharmacist 05/06/2021 2:26 PM

## 2021-05-06 NOTE — Plan of Care (Signed)
  Problem: Education: Goal: Knowledge of General Education information will improve Description Including pain rating scale, medication(s)/side effects and non-pharmacologic comfort measures Outcome: Progressing   

## 2021-05-06 NOTE — Progress Notes (Addendum)
Advanced Heart Failure Rounding Note  PCP-Cardiologist: Sinclair Grooms, MD   Subjective:     Continues on milrinone 0.125  2.2L UOP charted with IV lasix yesterday, about 1L so far today.   Weight down 6 lb overnight.  Scr 3.24 > 3.10. K 2.9  SBP 90s.  Dyspnea improving with diuresis. Reports he ambulated this am.   Continues to have epistaxis. Has tried Afrin.   Objective:   Weight Range: 51.3 kg Body mass index is 14.91 kg/m.   Vital Signs:   Temp:  [97.3 F (36.3 C)-97.8 F (36.6 C)] 97.8 F (36.6 C) (02/01 0400) Pulse Rate:  [56-89] 80 (02/01 0400) Resp:  [14-24] 20 (02/01 0400) BP: (90-141)/(42-109) 94/42 (02/01 0400) SpO2:  [96 %-100 %] 99 % (02/01 0400) Weight:  [51.3 kg] 51.3 kg (02/01 0400) Last BM Date: 05/05/21  Weight change: Filed Weights   05/05/21 0531 05/06/21 0400  Weight: 54 kg 51.3 kg    Intake/Output:   Intake/Output Summary (Last 24 hours) at 05/06/2021 1154 Last data filed at 05/06/2021 1058 Gross per 24 hour  Intake 549.51 ml  Output 3000 ml  Net -2450.49 ml      Physical Exam    General:  Cachectic AAM. No distress. HEENT: Normal Neck: Supple. JVP to ear. Carotids 2+ bilat; no bruits.  Cor: PMI nondisplaced. Irregular rate & rhythm. No rubs, gallops or murmurs. Lungs: Clear Abdomen: Soft, nontender, nondistended. No hepatosplenomegaly. No bruits or masses. Good bowel sounds. Extremities: No cyanosis, clubbing, rash, 1+ edema Neuro: Alert & orientedx3, cranial nerves grossly intact. moves all 4 extremities w/o difficulty. Affect flat   Telemetry   AF 70s (personally reviewed)   Labs    CBC Recent Labs    05/05/21 1506 05/06/21 0645  WBC 3.8* 3.6*  HGB 8.7* 8.0*  HCT 27.8* 25.0*  MCV 104.1* 101.6*  PLT 76* 68*   Basic Metabolic Panel Recent Labs    05/05/21 1506 05/06/21 0645  NA 134* 133*  K 3.4* 2.9*  CL 101 100  CO2 24 24  GLUCOSE 100* 92  BUN 51* 48*  CREATININE 3.24* 3.10*  CALCIUM 8.9  8.8*  MG 2.4 2.1   Liver Function Tests Recent Labs    05/06/21 0645  AST 155*  ALT 38  ALKPHOS 66  BILITOT 3.2*  PROT 7.8  ALBUMIN 3.3*   No results for input(s): LIPASE, AMYLASE in the last 72 hours. Cardiac Enzymes No results for input(s): CKTOTAL, CKMB, CKMBINDEX, TROPONINI in the last 72 hours.  BNP: BNP (last 3 results) Recent Labs    04/24/21 1231 04/30/21 1235  BNP 1,444.9* 898.1*    ProBNP (last 3 results) Recent Labs    07/25/20 1036  PROBNP 8,332*     D-Dimer No results for input(s): DDIMER in the last 72 hours. Hemoglobin A1C No results for input(s): HGBA1C in the last 72 hours. Fasting Lipid Panel No results for input(s): CHOL, HDL, LDLCALC, TRIG, CHOLHDL, LDLDIRECT in the last 72 hours. Thyroid Function Tests No results for input(s): TSH, T4TOTAL, T3FREE, THYROIDAB in the last 72 hours.  Invalid input(s): FREET3  Other results:   Imaging    ECHOCARDIOGRAM COMPLETE  Result Date: 05/05/2021    ECHOCARDIOGRAM REPORT   Patient Name:   LAIN TETTERTON Date of Exam: 05/05/2021 Medical Rec #:  [id number]       Height:       73.0 in Accession #:    [id number]  Weight:       119.0 lb Date of Birth:  11/25/1950       BSA:          1.726 m Patient Age:    59 years        BP:           115/42 mmHg Patient Gender: M               HR:           79 bpm. Exam Location:  Inpatient Procedure: 2D Echo Indications:    CHF  History:        Patient has prior history of Echocardiogram examinations, most                 recent 09/30/2020.                  Mitral Valve: mechanical valve valve is present in the mitral                 position.  Sonographer:    Jefferey Pica Referring Phys: [phone] LINDSAY NICOLE FINCH IMPRESSIONS  1. Left ventricular ejection fraction, by estimation, is 50 to 55%. The left ventricle has low normal function. The left ventricle demonstrates global hypokinesis. There is moderate left ventricular hypertrophy. Left ventricular diastolic  parameters are  indeterminate.  2. Right ventricular systolic function is severely reduced. The right ventricular size is moderately enlarged. There is moderately elevated pulmonary artery systolic pressure. The estimated right ventricular systolic pressure is 50.2 mmHg.  3. Left atrial size was severely dilated.  4. Right atrial size was moderately dilated.  5. The mitral valve has been repaired/replaced. No evidence of mitral valve regurgitation. No evidence of mitral stenosis. The mean mitral valve gradient is 6.0 mmHg. There is a mechanical valve present in the mitral position. Echo findings are consistent with normal structure and function of the mitral valve prosthesis.  6. The aortic valve is tricuspid. There is moderate calcification of the aortic valve. There is moderate thickening of the aortic valve. Aortic valve regurgitation is moderate. Aortic valve sclerosis/calcification is present, without any evidence of aortic stenosis.  7. Aortic dilatation noted. There is moderate dilatation of the ascending aorta, measuring 48 mm. There is moderate dilatation of the aortic root, measuring 47 mm.  8. The inferior vena cava is normal in size with greater than 50% respiratory variability, suggesting right atrial pressure of 3 mmHg. FINDINGS  Left Ventricle: Left ventricular ejection fraction, by estimation, is 50 to 55%. The left ventricle has low normal function. The left ventricle demonstrates global hypokinesis. The left ventricular internal cavity size was normal in size. There is moderate left ventricular hypertrophy. Left ventricular diastolic parameters are indeterminate. Right Ventricle: The right ventricular size is moderately enlarged. No increase in right ventricular wall thickness. Right ventricular systolic function is severely reduced. There is moderately elevated pulmonary artery systolic pressure. The tricuspid regurgitant velocity is 3.32 m/s, and with an assumed right atrial pressure of 15 mmHg,  the estimated right ventricular systolic pressure is 77.4 mmHg. Left Atrium: Left atrial size was severely dilated. Right Atrium: Right atrial size was moderately dilated. Pericardium: There is no evidence of pericardial effusion. Mitral Valve: The mitral valve has been repaired/replaced. No evidence of mitral valve regurgitation. There is a mechanical valve present in the mitral position. Echo findings are consistent with normal structure and function of the mitral valve prosthesis. No evidence of mitral valve stenosis. MV peak gradient,  18.5 mmHg. The mean mitral valve gradient is 6.0 mmHg. Tricuspid Valve: The tricuspid valve is normal in structure. Tricuspid valve regurgitation is mild . No evidence of tricuspid stenosis. Aortic Valve: The aortic valve is tricuspid. There is moderate calcification of the aortic valve. There is moderate thickening of the aortic valve. Aortic valve regurgitation is moderate. Aortic valve sclerosis/calcification is present, without any evidence of aortic stenosis. Aortic valve peak gradient measures 2.8 mmHg. Pulmonic Valve: The pulmonic valve was normal in structure. Pulmonic valve regurgitation is not visualized. No evidence of pulmonic stenosis. Aorta: Aortic dilatation noted. There is moderate dilatation of the ascending aorta, measuring 48 mm. There is moderate dilatation of the aortic root, measuring 47 mm. Venous: The inferior vena cava is normal in size with greater than 50% respiratory variability, suggesting right atrial pressure of 3 mmHg. IAS/Shunts: No atrial level shunt detected by color flow Doppler.  LEFT VENTRICLE PLAX 2D LVIDd:         3.80 cm LVIDs:         2.85 cm LV PW:         1.50 cm LV IVS:        1.35 cm LVOT diam:     2.60 cm LV SV:         54 LV SV Index:   31 LVOT Area:     5.31 cm  IVC IVC diam: 2.70 cm LEFT ATRIUM            Index        RIGHT ATRIUM           Index LA diam:      5.15 cm  2.98 cm/m   RA Area:     20.40 cm LA Vol (A2C): 118.0 ml  68.38 ml/m  RA Volume:   71.20 ml  41.26 ml/m  AORTIC VALVE                 PULMONIC VALVE AV Area (Vmax): 4.50 cm     PV Vmax:       0.68 m/s AV Vmax:        83.25 cm/s   PV Peak grad:  1.9 mmHg AV Peak Grad:   2.8 mmHg LVOT Vmax:      70.50 cm/s LVOT Vmean:     32.600 cm/s LVOT VTI:       0.102 m  AORTA Ao Root diam: 4.70 cm Ao Asc diam:  4.80 cm MITRAL VALVE                TRICUSPID VALVE MV Area (PHT): 4.12 cm     TR Peak grad:   44.1 mmHg MV Area VTI:   1.22 cm     TR Vmax:        332.00 cm/s MV Peak grad:  18.5 mmHg MV Mean grad:  6.0 mmHg     SHUNTS MV Vmax:       2.15 m/s     Systemic VTI:  0.10 m MV Vmean:      103.0 cm/s   Systemic Diam: 2.60 cm MV Decel Time: 184 msec MV E velocity: 205.00 cm/s Candee Furbish MD Electronically signed by Candee Furbish MD Signature Date/Time: 05/05/2021/2:59:54 PM    Final      Medications:     Scheduled Medications:  feeding supplement  237 mL Oral BID BM   furosemide  80 mg Intravenous BID   midodrine  5 mg Oral TID WC   polyethylene glycol  17 g Oral Daily   potassium chloride SA  40 mEq Oral Q4H   sodium chloride flush  3 mL Intravenous Q12H   tranexamic acid  500 mg Topical Once   Warfarin - Pharmacist Dosing Inpatient   Does not apply q1600    Infusions:  sodium chloride     milrinone 0.125 mcg/kg/min (05/05/21 1119)    PRN Medications: sodium chloride, acetaminophen, ondansetron (ZOFRAN) IV, oxymetazoline, sodium chloride, sodium chloride flush    Patient Profile   71 y.o. 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 Admitted with acute on chronic CHF.  Assessment/Plan   1.  Acute on chronic systolic CHF with low output/valvular HF with mixed moderate to severe PH/cor pulmonale in setting of longstanding MV disease - Echo 7/20  LVEF normal. RV severely dilated and HK. RVSP 55 - RHC 8/20 with significantly elevated biventricular pressures, moderate to  severe mixed PAH and low output (CI 1.96) - M-spike negative. PYP 1/21 negative for amyloid.  - Echo (6/22): newly reduced EF 35-40%, RV severely reduced, moderate AR, mod/severe PR. (Dr. Haroldine Laws felt EF 50-55%). - Lexiscan stress MPI, 08/22: Partially reversible basal to mid anterior defect consistent w/ infarct and peri-infarct ischemia EF 54% - RHC today with elevated filling pressures and low output. CO 2.03. PA sat 43%. Prominent v waves on PCWP tracing.  - Echo 01/31: EF 50-55%, RV severely reduced, RVSP 59 mmHg, BAE, mean gradient 6 mmHg across mitral valve prosthesis, no MR - On milrinone 0.125 for RV support. Diuresing well. Still volume up. Continue IV lasix. Supp K aggressively. - Hold spiro with AKI on CKD - No Entresto/ digoxin w/ CKD. - Add midodrine 5 TID - Daily BMET - Advanced options very limited. 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 Not ideal candidate for home palliative inotropes given infection risk with mechanical MV.   2. 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   - Echo 06/22: EF 35-40%, RV severely reduced, mechanical mitral valve with trivial MR - Prominent v waves on PCWP on RHC yesterday. Mitral valve prosthesis appears okay on TTE yesterday - On coumadin per pharmacy. INR 3.6 - Aware of need for SBE prophylaxis.    3.  Chronic a flutter  - Rate controlled, on Coumadin.    4.  Thoracic aneurysm, followed by Dr. Cyndia Bent  - CT chest 6/20 asc ao aorta 4.4 cm.   - 48 mm on echo this admit - Not surgical candidate.   5. AKI on Stage IV CKD - Followed by nephrology. - Baseline SCr~2.4. - Scr 3.24>3.1. Suspect cardiorenal - BP soft. Add midodrine to support renal perfusion   6. 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 Negative for high-grade dysplasia/carcinoma - Followed routinely by GI.  7. Anemia -Hgb 9.9>8.7>8.0 -? How much recurrent epistaxis contributing. Notes hx nosebleeds and has seen ENT in  past -discussed with PharmD. Will order packing kit  8. Thrombocytopenia -Chronic. Platelets 68 today -Will follow   9. OSA - Severe, newly diagnosed.  - Awaiting CPAP titration.  - Dr. Radford Pax following.    10. FTT - Previously working with a nutritionist. - Nearing end-stage RV failure.  - Likely need GOC discussion soon  Length of Stay: 1  FINCH, [place], PA-C  05/06/2021, 11:54 AM  Advanced Heart Failure Team Pager [phone] (M-F; 7a - 5p)  Please contact [facility] for night-coverage after hours (5p -  7a ) and weekends on [email or web address]   Patient seen and examined with the above-signed Advanced Practice Provider and/or Housestaff. I personally reviewed laboratory data, imaging studies and relevant notes. I independently examined the patient and formulated the important aspects of the plan. I have edited the note to reflect any of my changes or salient points. I have personally discussed the plan with the patient and/or family.  Breathing better on milrinone but still feels exhausted. Has diuresed well. Having epistaxis  General:  Cachetic weak No resp difficulty HEENT: normal Neck: supple. JVP 10  Carotids 2+ bilat; no bruits. No lymphadenopathy or thryomegaly appreciated. Cor: PMI nondisplaced. Regular rate & rhythm. Mechanical s1 Lungs: clear Abdomen: soft, nontender, nondistended. No hepatosplenomegaly. No bruits or masses. Good bowel sounds. Extremities: no cyanosis, clubbing, rash, 1+ edema Neuro: alert & orientedx3, cranial nerves grossly intact. moves all 4 extremities w/o difficulty. Affect pleasant  He has end-stage RV failure. We discussed his situation at length. Not candidate for advanced therapies. Will continue palliative milrinone for a few days to tune him up. Consult Palliative Care. We discussed Code Status and he does not want heroic measures. Will change status to DNR/DNI.   Glori Bickers, MD  3:44 PM

## 2021-05-07 DIAGNOSIS — Z7189 Other specified counseling: Secondary | ICD-10-CM | POA: Diagnosis not present

## 2021-05-07 DIAGNOSIS — I5032 Chronic diastolic (congestive) heart failure: Secondary | ICD-10-CM

## 2021-05-07 DIAGNOSIS — N184 Chronic kidney disease, stage 4 (severe): Secondary | ICD-10-CM | POA: Diagnosis not present

## 2021-05-07 DIAGNOSIS — I50810 Right heart failure, unspecified: Secondary | ICD-10-CM | POA: Diagnosis not present

## 2021-05-07 LAB — CBC
HCT: 28 % — ABNORMAL LOW (ref 39.0–52.0)
Hemoglobin: 9.1 g/dL — ABNORMAL LOW (ref 13.0–17.0)
MCH: 32.5 pg (ref 26.0–34.0)
MCHC: 32.5 g/dL (ref 30.0–36.0)
MCV: 100 fL (ref 80.0–100.0)
Platelets: 79 10*3/uL — ABNORMAL LOW (ref 150–400)
RBC: 2.8 MIL/uL — ABNORMAL LOW (ref 4.22–5.81)
RDW: 33.7 % — ABNORMAL HIGH (ref 11.5–15.5)
WBC: 5.2 10*3/uL (ref 4.0–10.5)
nRBC: 0 % (ref 0.0–0.2)

## 2021-05-07 LAB — BASIC METABOLIC PANEL
Anion gap: 11 (ref 5–15)
BUN: 51 mg/dL — ABNORMAL HIGH (ref 8–23)
CO2: 25 mmol/L (ref 22–32)
Calcium: 9 mg/dL (ref 8.9–10.3)
Chloride: 97 mmol/L — ABNORMAL LOW (ref 98–111)
Creatinine, Ser: 2.92 mg/dL — ABNORMAL HIGH (ref 0.61–1.24)
GFR, Estimated: 23 mL/min — ABNORMAL LOW (ref >60)
Glucose, Bld: 94 mg/dL (ref 70–99)
Potassium: 4.6 mmol/L (ref 3.5–5.1)
Sodium: 133 mmol/L — ABNORMAL LOW (ref 135–145)

## 2021-05-07 LAB — PROTIME-INR
INR: 3 — ABNORMAL HIGH (ref 0.8–1.2)
Prothrombin Time: 31.1 seconds — ABNORMAL HIGH (ref 11.4–15.2)

## 2021-05-07 MED ORDER — TRANEXAMIC ACID FOR EPISTAXIS
500.0000 mg | Freq: Once | TOPICAL | Status: AC
Start: 2021-05-07 — End: 2021-05-07
  Administered 2021-05-07: 500 mg via TOPICAL
  Filled 2021-05-07: qty 10

## 2021-05-07 MED ORDER — OXYMETAZOLINE HCL 0.05 % NA SOLN
1.0000 | Freq: Two times a day (BID) | NASAL | Status: AC | PRN
  Administered 2021-05-07: 1 via NASAL
  Filled 2021-05-07: qty 30

## 2021-05-07 MED ORDER — TRAZODONE HCL 50 MG PO TABS
50.0000 mg | ORAL_TABLET | Freq: Every evening | ORAL | Status: DC | PRN
  Administered 2021-05-07 – 2021-05-11 (×4): 50 mg via ORAL
  Filled 2021-05-07 (×5): qty 1

## 2021-05-07 MED ORDER — WARFARIN SODIUM 1 MG PO TABS
1.0000 mg | ORAL_TABLET | Freq: Once | ORAL | Status: AC
  Administered 2021-05-07: 1 mg via ORAL
  Filled 2021-05-07: qty 1

## 2021-05-07 NOTE — Consult Note (Signed)
Reason for Consult: Recurrent epistaxis  HPI:  [name] is an 71 y.o. male who was admitted on 05/05/2021 for treatment of his heart failure exacerbation. He has a past medical history of rheumatic heart disease, CHF, mitral valve replacement x 3, chronic a flutter, chronic kidney disease stage 4, and chronic anemia.  According to the patient, he has a history of recurrent epistaxis.  He was previously treated with cauterization procedure.  Since his admission, he has been experiencing recurrent left-sided epistaxis.  He is currently on Coumadin.  The severity of his bleeding has increased.  The bleeding is both anterior and posterior.  He was treated with Afrin and nasal packing, with persistent bleeding.  Past Medical History:  Diagnosis Date   Allergy    Aneurysm of ascending aorta    Atrial fibrillation (HCC)    BPH (benign prostatic hyperplasia)    Colon polyps    FHx: rheumatic heart disease    H/O diplopia    Hepatitis C    Hypertension    Hypogonadism male    Mitral valve disease    mitral valve repair/replacement x3   OSA (obstructive sleep apnea) 12/15/2020    Past Surgical History:  Procedure Laterality Date   BIOPSY  11/19/2019   Procedure: BIOPSY;  Surgeon: Yetta Flock, MD;  Location: [facility];  Service: Gastroenterology;;  EGD and COLON   COLON SURGERY     COLONOSCOPY     COLONOSCOPY N/A 11/14/2012   Procedure: COLONOSCOPY;  Surgeon: Lear Ng, MD;  Location: [facility];  Service: Endoscopy;  Laterality: N/A;   COLONOSCOPY WITH PROPOFOL N/A 11/19/2019   Procedure: COLONOSCOPY WITH PROPOFOL;  Surgeon: Yetta Flock, MD;  Location[facility];  Service: Gastroenterology;  Laterality: N/A;   COLONOSCOPY WITH PROPOFOL N/A 11/27/2019   Procedure: COLONOSCOPY WITH PROPOFOL;  Surgeon: Doran Stabler, MD;  [facility];  Service: Gastroenterology;  Laterality: N/A;   ESOPHAGOGASTRODUODENOSCOPY (EGD) WITH PROPOFOL N/A 05/08/2014    Procedure: ESOPHAGOGASTRODUODENOSCOPY (EGD) WITH PROPOFOL;  Surgeon: Lear Ng, MD;  Location: [facility];  Service: Endoscopy;  Laterality: N/A;   ESOPHAGOGASTRODUODENOSCOPY (EGD) WITH PROPOFOL N/A 11/19/2019   Procedure: ESOPHAGOGASTRODUODENOSCOPY (EGD) WITH PROPOFOL;  Surgeon: Yetta Flock, MD;  [facility];  Service: Gastroenterology;  Laterality: N/A;   ESOPHAGOGASTRODUODENOSCOPY (EGD) WITH PROPOFOL N/A 05/07/2020   Procedure: ESOPHAGOGASTRODUODENOSCOPY (EGD) WITH PROPOFOL;  Surgeon: Yetta Flock, MD;  [facility];  Service: Gastroenterology;  Laterality: N/A;   HEMOSTASIS CLIP PLACEMENT  11/19/2019   Procedure: HEMOSTASIS CLIP PLACEMENT;  Surgeon: Yetta Flock, MD;  [facility];  Service: Gastroenterology;;   HEMOSTASIS CLIP PLACEMENT  11/27/2019   Procedure: HEMOSTASIS CLIP PLACEMENT;  Surgeon: Doran Stabler, MD;  [facility];  Service: Gastroenterology;;   HEMOSTASIS CLIP PLACEMENT  05/07/2020   Procedure: HEMOSTASIS CLIP PLACEMENT;  Surgeon: Yetta Flock, MD;  [facility];  Service: Gastroenterology;;   MITRAL VALVE REPLACEMENT     1973 (bioprosthesis), 1988 ([employer] Jude mechanical vlave) , 2006 (repair due to leak)   POLYPECTOMY  11/19/2019   Procedure: POLYPECTOMY;  Surgeon: Yetta Flock, MD;  Location: [facility];  Service: Gastroenterology;;   POLYPECTOMY  05/07/2020   Procedure: POLYPECTOMY;  Surgeon: Yetta Flock, MD;  [facility];  Service: Gastroenterology;;   RIGHT HEART CATH N/A 11/24/2018   Procedure: RIGHT HEART CATH;  Surgeon: Jolaine Artist, MD;  Location: [facility];  Service: Cardiovascular;  Laterality: N/A;   RIGHT HEART CATH N/A 10/23/2019   Procedure: RIGHT HEART CATH;  Surgeon: Jolaine Artist, MD;  Location: [facility];  Service: Cardiovascular;  Laterality: N/A;   RIGHT HEART CATH N/A 05/05/2021   Procedure: RIGHT HEART  CATH;  Surgeon: Jolaine Artist, MD;  Location: [facility];  Service: Cardiovascular;  Laterality: N/A;   SUBMUCOSAL LIFTING INJECTION  05/07/2020   Procedure: SUBMUCOSAL LIFTING INJECTION;  Surgeon: Yetta Flock, MD;  [facility];  Service: Gastroenterology;;   UPPER GASTROINTESTINAL ENDOSCOPY     02-2010 and 05-2014    Family History  Family history unknown: Yes    Social History:  reports that he quit smoking about 3 years ago. His smoking use included cigars. He has never used smokeless tobacco. He reports that he does not drink alcohol and does not use drugs.  Allergies:  Allergies  Allergen Reactions   Penicillins Rash    Did it involve swelling of the face/tongue/throat, SOB, or low BP? No Did it involve sudden or severe rash/hives, skin peeling, or any reaction on the inside of your mouth or nose? No Did you need to seek medical attention at a hospital or doctor's office? No When did it last happen?      30 + years If all above answers are NO, may proceed with cephalosporin use.     Prior to Admission medications   Medication Sig Start Date End Date Taking? Authorizing Provider  Efinaconazole 10 % SOLN Apply to toenail daily for 48 weeks 06/17/20  Yes [name], MD  ferrous sulfate 325 (65 FE) MG tablet Take 1 tablet (325 mg total) by mouth every other day. In the evening. Patient taking differently: Take 325 mg by mouth every other day. In the evening. 12/02/19  Yes Nita Sells, MD  furosemide (LASIX) 40 MG tablet Take 1.5 tablets (60 mg total) by mouth daily. Patient taking differently: Take 40 mg by mouth daily. 04/24/21  Yes Milford, Maricela Bo, FNP  hydrOXYzine (ATARAX) 10 MG tablet Take 1 tablet (10 mg total) by mouth 3 (three) times daily as needed. 03/19/21  Yes Simmons-Robinson, Makiera, MD  metolazone (ZAROXOLYN) 2.5 MG tablet TAKE AS DIRECTED BY THE HEART FAILURE CLINIC Patient taking differently: Take 2.5 mg by mouth  daily as needed (Fluid). Take as directed by the heart failure clinic 11/25/20  Yes Bensimhon, Shaune Pascal, MD  Multiple Vitamins-Minerals (MULTIVITAMIN WITH MINERALS) tablet Take 1 tablet by mouth daily.   Yes [provider]  polyethylene glycol (MIRALAX) 17 g packet Take 17 g by mouth daily as needed. 08/28/19  Yes Armbruster, Carlota Raspberry, MD  potassium chloride SA (KLOR-CON M) 20 MEQ tablet Take 2 tablets (40 mEq total) by mouth daily. 03/11/21  Yes Larey Dresser, MD  sildenafil (REVATIO) 20 MG tablet Take 20 mg by mouth daily as needed (ED). 11/13/19  Yes [provider]  Testosterone 1.62 % GEL Apply 2 Pump topically daily. 1 pump on each arm 03/05/20  Yes [provider]  warfarin (COUMADIN) 5 MG tablet Take 1/2 a tablet to 1 tablet by mouth daily as directed by the coumadin clinic. Patient taking differently: Take 2.5-5 mg by mouth See admin instructions. Take 2.5 mg on Thursday, Saturday and Sunday all the other days take 5 mg as directed by the coumadin clinic. 04/15/21  Yes Belva Crome, MD  amoxicillin (AMOXIL) 500 MG capsule TAKE 4 CAPSULES BY MOUTH 1 HOUR BEFORE DENTAL  APPOINTMENT 03/06/20   Belva Crome, MD  enoxaparin (LOVENOX) 60 MG/0.6ML injection Inject 0.6 mLs (60 mg total) into the skin daily. 04/24/20   Belva Crome, MD  spironolactone (ALDACTONE) 25 MG tablet Take 1 tablet (25 mg total) by mouth daily. Patient not taking: Reported on 04/29/2021 12/01/20   Bensimhon, Shaune Pascal, MD    Medications: I have reviewed the patient's current medications. Scheduled:  feeding supplement  237 mL Oral TID BM   furosemide  80 mg Intravenous BID   midodrine  5 mg Oral TID WC   multivitamin  1 tablet Oral QHS   polyethylene glycol  17 g Oral Daily   sodium chloride flush  3 mL Intravenous Q12H   Warfarin - Pharmacist Dosing Inpatient   Does not apply q1600   Continuous:  sodium chloride     milrinone 0.125 mcg/kg/min (05/06/21 2039)   AOZ:HYQMVH chloride,  acetaminophen, ondansetron (ZOFRAN) IV, oxymetazoline, sodium chloride, sodium chloride flush  Results for orders placed or performed during the hospital encounter of 05/05/21 (from the past 48 hour(s))  Protime-INR     Status: Abnormal   Collection Time: 05/06/21  6:45 AM  Result Value Ref Range   Prothrombin Time 35.6 (H) 11.4 - 15.2 seconds   INR 3.6 (H) 0.8 - 1.2    Comment: (NOTE) INR goal varies based on device and disease states. Performed at [facility], [place] [address], [place], [place] [address]   CBC     Status: Abnormal   Collection Time: 05/06/21  6:45 AM  Result Value Ref Range   WBC 3.6 (L) 4.0 - 10.5 K/uL   RBC 2.46 (L) 4.22 - 5.81 MIL/uL   Hemoglobin 8.0 (L) 13.0 - 17.0 g/dL   HCT 25.0 (L) 39.0 - 52.0 %   MCV 101.6 (H) 80.0 - 100.0 fL   MCH 32.5 26.0 - 34.0 pg   MCHC 32.0 30.0 - 36.0 g/dL   RDW 33.2 (H) 11.5 - 15.5 %   Platelets 68 (L) 150 - 400 K/uL    Comment: Immature Platelet Fraction may be clinically indicated, consider ordering this additional test [id number] CONSISTENT WITH PREVIOUS RESULT REPEATED TO VERIFY    nRBC 0.0 0.0 - 0.2 %    Comment: Performed at [facility], [place] [address], [place], [place] [address]  Comprehensive metabolic panel     Status: Abnormal   Collection Time: 05/06/21  6:45 AM  Result Value Ref Range   Sodium 133 (L) 135 - 145 mmol/L   Potassium 2.9 (L) 3.5 - 5.1 mmol/L   Chloride 100 98 - 111 mmol/L   CO2 24 22 - 32 mmol/L   Glucose, Bld 92 70 - 99 mg/dL    Comment: Glucose reference range applies only to samples taken after fasting for at least 8 hours.   BUN 48 (H) 8 - 23 mg/dL   Creatinine, Ser 3.10 (H) 0.61 - 1.24 mg/dL   Calcium 8.8 (L) 8.9 - 10.3 mg/dL   Total Protein 7.8 6.5 - 8.1 g/dL   Albumin 3.3 (L) 3.5 - 5.0 g/dL   AST 155 (H) 15 - 41 U/L   ALT 38 0 - 44 U/L   Alkaline Phosphatase 66 38 - 126 U/L   Total Bilirubin 3.2 (H) 0.3 - 1.2 mg/dL   GFR, Estimated 21 (L) >60 mL/min    Comment:  (NOTE) Calculated using the CKD-EPI Creatinine Equation (2021)    Anion gap 9 5 - 15  Comment: Performed at [facility], [place] [address] [place], [place] [address]  Magnesium     Status: None   Collection Time: 05/06/21  6:45 AM  Result Value Ref Range   Magnesium 2.1 1.7 - 2.4 mg/dL    Comment: Performed at [facility] [address] [place], [place] [address]  Protime-INR     Status: Abnormal   Collection Time: 05/07/21  2:08 AM  Result Value Ref Range   Prothrombin Time 31.1 (H) 11.4 - 15.2 seconds   INR 3.0 (H) 0.8 - 1.2    Comment: (NOTE) INR goal varies based on device and disease states. Performed at [facility], [place] [address] [place], [place] [address]   Basic metabolic panel     Status: Abnormal   Collection Time: 05/07/21  2:08 AM  Result Value Ref Range   Sodium 133 (L) 135 - 145 mmol/L   Potassium 4.6 3.5 - 5.1 mmol/L    Comment: DELTA CHECK NOTED   Chloride 97 (L) 98 - 111 mmol/L   CO2 25 22 - 32 mmol/L   Glucose, Bld 94 70 - 99 mg/dL    Comment: Glucose reference range applies only to samples taken after fasting for at least 8 hours.   BUN 51 (H) 8 - 23 mg/dL   Creatinine, Ser 2.92 (H) 0.61 - 1.24 mg/dL   Calcium 9.0 8.9 - 10.3 mg/dL   GFR, Estimated 23 (L) >60 mL/min    Comment: (NOTE) Calculated using the CKD-EPI Creatinine Equation (2021)    Anion gap 11 5 - 15    Comment: Performed at [facility] [address], [place], [place] [address]  CBC     Status: Abnormal   Collection Time: 05/07/21  2:08 AM  Result Value Ref Range   WBC 5.2 4.0 - 10.5 K/uL   RBC 2.80 (L) 4.22 - 5.81 MIL/uL   Hemoglobin 9.1 (L) 13.0 - 17.0 g/dL   HCT 28.0 (L) 39.0 - 52.0 %   MCV 100.0 80.0 - 100.0 fL   MCH 32.5 26.0 - 34.0 pg   MCHC 32.5 30.0 - 36.0 g/dL   RDW 33.7 (H) 11.5 - 15.5 %   Platelets 79 (L) 150 - 400 K/uL    Comment: Immature Platelet Fraction may be clinically indicated, consider ordering this additional  test [id number] CONSISTENT WITH PREVIOUS RESULT REPEATED TO VERIFY    nRBC 0.0 0.0 - 0.2 %    Comment: Performed at [facility], [place] [address], [place], [place] [address]    No results found.  Review of Systems: [y] = yes, [ ]  = no    General: Weight gain [ ] ; Weight loss [ ] ; Anorexia [ ] ; Fatigue [ ] ; Fever [ ] ; Chills [ ] ; Weakness [Y]  Cardiac: Chest pain/pressure [ ] ; Resting SOB [ ] ; Exertional SOB [Y]; Orthopnea [Y]; Pedal Edema [Y]; Palpitations [ ] ; Syncope [ ] ; Presyncope [ ] ; Paroxysmal nocturnal dyspnea[ ]   Pulmonary: Cough [ ] ; Wheezing[ ] ; Hemoptysis[ ] ; Sputum [ ] ; Snoring [ ]   GI: Vomiting[ ] ; Dysphagia[ ] ; Melena[ ] ; Hematochezia [ ] ; Heartburn[ ] ; Abdominal pain [ ] ; Constipation [ ] ; Diarrhea [ ] ; BRBPR [ ]   GU: Hematuria[ ] ; Dysuria [ ] ; Nocturia[ ]   Vascular: Pain in legs with walking [ ] ; Pain in feet with lying flat [ ] ; Non-healing sores [ ] ; Stroke [ ] ; TIA [ ] ; Slurred speech [ ] ;  Neuro: Headaches[ ] ;  Vertigo[ ] ; Seizures[ ] ; Paresthesias[ ] ;Blurred vision [ ] ; Diplopia [ ] ; Vision changes [ ]   Ortho/Skin: Arthritis [ ] ; Joint pain [ ] ; Muscle pain [ ] ; Joint swelling [ ] ; Back Pain [ ] ; Rash [ ]   Psych: Depression[ ] ; Anxiety[ ]   Heme: Bleeding problems [Y]; Clotting disorders [ ] ; Anemia [ ]   Endocrine: Diabetes [ ] ; Thyroid dysfunction[ ]   Physical Examination Blood pressure 105/62, pulse 80, temperature 97.8 F (36.6 C), temperature source Oral, resp. rate (!) 21, height 6\' 1"  (1.854 m), weight 50.4 kg, SpO2 92 %. General appearance: alert and cooperative Head: Normocephalic, without obvious abnormality, atraumatic Eyes: Pupils are equal, round, reactive to light. Extraocular motion is intact.  Ears: Examination of the ears shows normal auricles and external auditory canals bilaterally.  Nose: Nasal examination shows acute left-sided bleeding. Face: Facial examination shows no asymmetry. Palpation of the face elicit no significant tenderness.   Mouth: Oral cavity examination shows no mucosal lacerations. No significant trismus is noted.  Neck: Palpation of the neck reveals no lymphadenopathy or mass. The trachea is midline.   Procedure: Anterior/Posterior nasal packing for control of left epistaxis. Anesthesia: Topical xylocaine and Afrin Description: The patient is placed upright in his hospital bed.  Blood clot is suctioned from the left nasal cavity. Bleeding is noted from anterior and posterior left nasal cavity.Topical xylocaine and Afrin are applied. A 10cm Merocel packing is placed in the left nasal cavity with good hemostasis.  The patient tolerated the procedure well.   Assessment/Plan: Recurrent left epistaxis. -Bleeding controlled with left anterior/posterior packing. -Will leave the Merocel packing in place for 5 days. -Will need gram-positive antibiotic coverage (eg.  Keflex, clindamycin) while the packing is in place. -Follow-up information is given to the patient.  Suzette Flagler W Ferrell Flam 05/07/2021, 6:13 PM

## 2021-05-07 NOTE — Progress Notes (Addendum)
Advanced Heart Failure Rounding Note  PCP-Cardiologist: Belva Crome III, MD   Subjective:    Remains on milrinone 0.125. 3.8L in UOP yesterday. Wt down 2 lb, 8 lb total since admit.   Scr 3.24 > 3.10>>2.92.   BP better w/ midodrine. SBPs 110s.   Continues w/ nosebleed. Hbg stable, 8.0>>9.1. Plt 68>>79K. He is requesting ENT consult   From HF standpoint, he is feeling better. Dyspnea improved. Current comfortable at rest. Feels week w/ ambulation. Appetite is good, ate all of his breakfast.   Objective:   Weight Range: 50.4 kg Body mass index is 14.66 kg/m.   Vital Signs:   Temp:  [97.7 F (36.5 C)-97.8 F (36.6 C)] 97.8 F (36.6 C) (02/02 0000) Pulse Rate:  [76-78] 76 (02/02 0800) Resp:  [15-26] 19 (02/02 0800) BP: (98-127)/(56-99) 114/99 (02/02 0800) SpO2:  [92 %-100 %] 92 % (02/02 0800) Weight:  [50.4 kg] 50.4 kg (02/02 0403) Last BM Date: 05/05/21  Weight change: Filed Weights   05/05/21 0531 05/06/21 0400 05/07/21 0403  Weight: 54 kg 51.3 kg 50.4 kg    Intake/Output:   Intake/Output Summary (Last 24 hours) at 05/07/2021 0823 Last data filed at 05/07/2021 0600 Gross per 24 hour  Intake 307.96 ml  Output 3875 ml  Net -3567.04 ml      Physical Exam   General:  thin/cachetic appearing. No respiratory difficulty HEENT: normal + minor nosebleed  Neck: supple. JVD 8 cm. Carotids 2+ bilat; no bruits. No lymphadenopathy or thyromegaly appreciated. Cor: PMI nondisplaced. Irregular rhythm. Crisp mechanical valve sounds  Lungs: clear Abdomen: soft, nontender, nondistended. No hepatosplenomegaly. No bruits or masses. Good bowel sounds. Extremities: no cyanosis, clubbing, rash, edema Neuro: alert & oriented x 3, cranial nerves grossly intact. moves all 4 extremities w/o difficulty. Affect pleasant.  Telemetry   Chronic Aflutter 70s w/ PVCs, 7 beat run NSVT (personally reviewed)   Labs    CBC Recent Labs    05/06/21 0645 05/07/21 0208  WBC 3.6* 5.2   HGB 8.0* 9.1*  HCT 25.0* 28.0*  MCV 101.6* 100.0  PLT 68* 79*   Basic Metabolic Panel Recent Labs    05/05/21 1506 05/06/21 0645 05/07/21 0208  NA 134* 133* 133*  K 3.4* 2.9* 4.6  CL 101 100 97*  CO2 24 24 25   GLUCOSE 100* 92 94  BUN 51* 48* 51*  CREATININE 3.24* 3.10* 2.92*  CALCIUM 8.9 8.8* 9.0  MG 2.4 2.1  --    Liver Function Tests Recent Labs    05/06/21 0645  AST 155*  ALT 38  ALKPHOS 66  BILITOT 3.2*  PROT 7.8  ALBUMIN 3.3*   No results for input(s): LIPASE, AMYLASE in the last 72 hours. Cardiac Enzymes No results for input(s): CKTOTAL, CKMB, CKMBINDEX, TROPONINI in the last 72 hours.  BNP: BNP (last 3 results) Recent Labs    04/24/21 1231 04/30/21 1235  BNP 1,444.9* 898.1*    ProBNP (last 3 results) Recent Labs    07/25/20 1036  PROBNP 8,332*     D-Dimer No results for input(s): DDIMER in the last 72 hours. Hemoglobin A1C No results for input(s): HGBA1C in the last 72 hours. Fasting Lipid Panel No results for input(s): CHOL, HDL, LDLCALC, TRIG, CHOLHDL, LDLDIRECT in the last 72 hours. Thyroid Function Tests No results for input(s): TSH, T4TOTAL, T3FREE, THYROIDAB in the last 72 hours.  Invalid input(s): FREET3  Other results:   Imaging    No results found.   Medications:  Scheduled Medications:  feeding supplement  237 mL Oral TID BM   furosemide  80 mg Intravenous BID   midodrine  5 mg Oral TID WC   multivitamin  1 tablet Oral QHS   polyethylene glycol  17 g Oral Daily   sodium chloride flush  3 mL Intravenous Q12H   Warfarin - Pharmacist Dosing Inpatient   Does not apply q1600    Infusions:  sodium chloride     milrinone 0.125 mcg/kg/min (05/06/21 2039)    PRN Medications: sodium chloride, acetaminophen, ondansetron (ZOFRAN) IV, oxymetazoline, sodium chloride, sodium chloride flush    Patient Profile   71 y.o. 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 Admitted with acute on chronic CHF.  Assessment/Plan   1.  Acute on chronic systolic CHF with low output/valvular HF with mixed moderate to severe PH/cor pulmonale in setting of longstanding MV disease - Echo 7/20  LVEF normal. RV severely dilated and HK. RVSP 55 - RHC 8/20 with significantly elevated biventricular pressures, moderate to severe mixed PAH and low output (CI 1.96) - M-spike negative. PYP 1/21 negative for amyloid.  - Echo (6/22): newly reduced EF 35-40%, RV severely reduced, moderate AR, mod/severe PR. (Dr. Haroldine Laws felt EF 50-55%). - Lexiscan stress MPI, 08/22: Partially reversible basal to mid anterior defect consistent w/ infarct and peri-infarct ischemia EF 54% - RHC today with elevated filling pressures and low output. CO 2.03. PA sat 43%. Prominent v waves on PCWP tracing.  - Echo 01/31: EF 50-55%, RV severely reduced, RVSP 59 mmHg, BAE, mean gradient 6 mmHg across mitral valve prosthesis, no MR - On milrinone 0.125 for RV support. Diuresing well. Still volume up but improving. Continue IV lasix.  - Hold spiro with AKI on CKD - No Entresto/ digoxin w/ CKD. - Continue midodrine 5 TID for BP support  - Advanced options very limited. Likely not transplant candidate w/ CKD 4. Not ideal candidate for home palliative inotropes given infection risk with mechanical MV. We discussed Code Status and he does not want heroic measures. Now DNR/DNI -Will continue palliative milrinone for a few days to tune him up -Consult Palliative Care   2. 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   - Echo 06/22: EF 35-40%, RV severely reduced, mechanical mitral valve with trivial MR - Prominent v waves on PCWP on RHC. Mitral valve prosthesis appears okay on TTE  - On coumadin per pharmacy. INR 3.0 - Aware of need for SBE prophylaxis.    3.  Chronic a flutter  - Rate controlled, on Coumadin.    4.  Thoracic aneurysm, followed by Dr.  Cyndia Bent  - CT chest 6/20 asc ao aorta 4.4 cm.   - 48 mm on echo this admit - Not surgical candidate.   5. AKI on Stage IV CKD - Followed by nephrology. - Baseline SCr~2.4. - Scr 3.24>3.1>2.92. Suspect cardiorenal - C/w midodrine to support renal perfusion   6. 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 Negative for high-grade dysplasia/carcinoma - Followed routinely by GI.  7. Anemia -Hgb 9.9>8.7>8.0>9.1  -? How much recurrent epistaxis contributing. Notes hx nosebleeds and has seen ENT in past  8. Thrombocytopenia -Chronic. Platelets 68>>79K today -Will follow   9. OSA - Severe, newly diagnosed.  - Awaiting CPAP titration.  - Dr. Radford Pax following.   10. Epistasis  - persistent nosebleed despite conservative measures - consult ENT  11. FTT - Previously working with a nutritionist. - Nearing end-stage RV failure.  - Palliative Care consulted   12. DNR/DNI  Length of Stay: 2  Lyda Jester, PA-C  05/07/2021, 8:23 AM  Advanced Heart Failure Team Pager [phone] (M-F; 7a - 5p)  Please contact [facility] for night-coverage after hours (5p -7a ) and weekends on [email or web address]  Patient seen and examined with the above-signed Advanced Practice Provider and/or Housestaff. I personally reviewed laboratory data, imaging studies and relevant notes. I independently examined the patient and formulated the important aspects of the plan. I have edited the note to reflect any of my changes or salient points. I have personally discussed the plan with the patient and/or family.  Feeling better on milrinone and with IV diuresis. Breathing better. Scr improved. No orthopnea or PND. Main complaint is ongoing epistaxis  General:  Weak appearing. No resp difficulty HEENT: normal x L nare packed Neck: supple. JVP 8-9  Carotids 2+ bilat; no bruits. No lymphadenopathy or thryomegaly appreciated. Cor: PMI nondisplaced. Mechanical s1 Lungs: clear Abdomen: soft, nontender,  nondistended. No hepatosplenomegaly. No bruits or masses. Good bowel sounds. Extremities: no cyanosis, clubbing, rash, edema Neuro: alert & orientedx3, cranial nerves grossly intact. moves all 4 extremities w/o difficulty. Affect pleasant  He has end-stage RV failure. Improved with milrinone and diuresis. Will continue milrinone and IV diuresis one more day. Likely wean milrinone tomorrow.  Long discussion with him and his wife (they are separated) about situation. Discussed palliative approach (which they agree with). Appreciate Palliative Care input  ENT to see tonight. (Thank you)  Glori Bickers, MD  6:23 PM

## 2021-05-07 NOTE — Progress Notes (Signed)
[place] for warfarin Indication:  mechanical mitral valve, chronic aflutter  Allergies  Allergen Reactions   Penicillins Rash    Did it involve swelling of the face/tongue/throat, SOB, or low BP? No Did it involve sudden or severe rash/hives, skin peeling, or any reaction on the inside of your mouth or nose? No Did you need to seek medical attention at a hospital or doctor's office? No When did it last happen?      30 + years If all above answers are NO, may proceed with cephalosporin use.     Patient Measurements: Height: 6' 1" (185.4 cm) Weight: 50.4 kg (111 lb 1.8 oz) IBW/kg (Calculated) : 79.9  Vital Signs: BP: 100/55 (02/02 1000) Pulse Rate: 76 (02/02 0800)  Labs: Recent Labs    05/05/21 0600 05/05/21 0754 05/05/21 1506 05/06/21 0645 05/07/21 0208  HGB  --    < > 8.7* 8.0* 9.1*  HCT  --    < > 27.8* 25.0* 28.0*  PLT  --   --  76* 68* 79*  LABPROT 37.0*  --   --  35.6* 31.1*  INR 3.7*  --   --  3.6* 3.0*  CREATININE  --   --  3.24* 3.10* 2.92*   < > = values in this interval not displayed.     Estimated Creatinine Clearance: 17.3 mL/min (A) (by C-G formula based on SCr of 2.92 mg/dL (H)).   Medical History: Past Medical History:  Diagnosis Date   Allergy    Aneurysm of ascending aorta    Atrial fibrillation (HCC)    BPH (benign prostatic hyperplasia)    Colon polyps    FHx: rheumatic heart disease    H/O diplopia    Hepatitis C    Hypertension    Hypogonadism male    Mitral valve disease    mitral valve repair/replacement x3   OSA (obstructive sleep apnea) 12/15/2020    Assessment: 71 year old male with history of mitral valve replacement x 3, currently with mechanical valve. Patient being admitted after RHC. INR elevated at 3.7. No cbc done. No hematoma noted post cath.   INR 3.0 at goal with low doses of warfarin. Patient still c/o nose bleeds. Saline nasal spray - risnse, Afrin and  TXA epistaxis kit.  Used yestwerday and today.  HGB low stable 8s, plt low at baseline 70-90s   Warfarin dosing prior to admit was 4m MWF and 2.531mall other days, which was adjusted for cath. Prior visit shows regimen as 2.100m22mn Sun/Thur and 100mg60ml other days.   Goal of Therapy:  INR goal 2.5-3.5 Monitor platelets by anticoagulation protocol: Yes   Plan:  Give low dose warfarin tonight to prevent rapid drop -1mg 28mly INR  Follow up on resolution of nose bleeds - ENT consulted   [name] Bonnita Nasutim.D. CPP, BCPS Clinical Pharmacist [phone] 4:14 PM

## 2021-05-07 NOTE — TOC Initial Note (Signed)
Transition of Care [facility]) - Initial/Assessment Note    Patient Details  Name: [name] MRN: [id number] Date of Birth: [birth year]  Transition [facility] CM/SW Contact:    Delaine Canter, LCSW Phone Number: 05/07/2021, 1:13 PM  Clinical Narrative:                 HF CSW spoke with [name] and family at bedside. [name] wanted to know about next steps and what the discharge plan will look like as someone mentioned something about palliative. CSW explained that typically PT/OT are consulted and then depending on his mobility they make a recommendation for home health or SNF/rehab, etc. HF CSW completed a very brief SDOH with the patient who denied having any needs at this time. Patient reported they do have a PCP and they can get to the pharmacy to pick up their medications and he uses the [facility] but will be switching [facility]. CSW provided the patient with the social workers name and position and if anything changes to please reach out so that the CSW can provide support.  CSW will continue to follow throughout discharge.    Barriers to Discharge: Continued Medical Work up   Patient Goals and CMS Choice        Expected Discharge Plan and Services   In-house Referral: Clinical Social Work Discharge Planning Services: CM Consult   Living arrangements for the past 2 months: Apartment                                      Prior Living Arrangements/Services Living arrangements for the past 2 months: Apartment Lives with:: Self Patient language and need for interpreter reviewed:: Yes Do you feel safe going back to the place where you live?: Yes      Need for Family Participation in Patient Care: No (Comment) Care giver support system in place?: No (comment)   Criminal Activity/Legal Involvement Pertinent to Current Situation/Hospitalization: No - Comment as needed  Activities of Daily Living Home Assistive Devices/Equipment: Eyeglasses ADL Screening  (condition at time of admission) Patient's cognitive ability adequate to safely complete daily activities?: Yes Is the patient deaf or have difficulty hearing?: No Does the patient have difficulty seeing, even when wearing glasses/contacts?: No Does the patient have difficulty concentrating, remembering, or making decisions?: No Patient able to express need for assistance with ADLs?: Yes Does the patient have difficulty dressing or bathing?: Yes Independently performs ADLs?: No Communication: Independent Dressing (OT): Needs assistance Is this a change from baseline?: Pre-admission baseline Grooming: Needs assistance Is this a change from baseline?: Pre-admission baseline Feeding: Independent Bathing: Needs assistance Is this a change from baseline?: Pre-admission baseline Toileting: Needs assistance Is this a change from baseline?: Pre-admission baseline In/Out Bed: Needs assistance Does the patient have difficulty walking or climbing stairs?: Yes Weakness of Legs: Both Weakness of Arms/Hands: None  Permission Sought/Granted   Permission granted to share information with : Yes, Verbal Permission Granted     Permission granted to share info w AGENCY: HH or SNF's        Emotional Assessment Appearance:: Appears stated age Attitude/Demeanor/Rapport: Engaged Affect (typically observed): Pleasant Orientation: : Oriented to Self, Oriented to Place, Oriented to  Time, Oriented to Situation   Psych Involvement: No (comment)  Admission diagnosis:  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[place]) [I50.810] Patient Active Problem List   Diagnosis Date Noted   Protein-calorie  malnutrition, severe 05/06/2021   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[place]) 05/05/2021   Pruritus 03/19/2021   Epistaxis, recurrent 03/19/2021   OSA (obstructive sleep apnea) 12/15/2020   Rhomboid muscle strain 09/05/2020   Hospital discharge follow-up 06/17/2020   History of hypokalemia 06/17/2020   CKD (chronic kidney  disease) stage 4, GFR 15-29 ml/min ([place]) 06/17/2020   Onychomycosis 06/17/2020   H/O mitral valve replacement with mechanical valve    Persistent atrial fibrillation (HCC)    Duodenal adenoma    Anticoagulated    Acute blood loss anemia 11/26/2019   Gastric polyp    Benign neoplasm of colon    Vertigo 06/07/2018   Acute on chronic diastolic heart failure ([place]) 03/04/2018   Cirrhosis ([place]) 05/08/2014   Chronic atrial flutter ([place]) 06/05/2013   Hepatitis C 06/05/2013   Chronic diastolic heart failure ([place]) 06/05/2013   Encounter for therapeutic drug monitoring 04/30/2013   Mechanical mitral valve with surgery x3 01/08/2013   History of colonic polyps 11/14/2012   Thoracic aortic aneurysm 05/02/2012   PCP:  Sharion Settler, DO Pharmacy:   [facility] [place] [address] [address] [place] [address] Phone: [phone] Fax: [id number] - [place], [address] AT [facility] OF [address] [place] [address] Phone: [phone] Fax: [phone]     Social Determinants of Health (SDOH) Interventions Food Insecurity Interventions: Intervention Not Indicated Financial Strain Interventions: Intervention Not Indicated Housing Interventions: Intervention Not Indicated Transportation Interventions: Intervention Not Indicated  Readmission Risk Interventions No flowsheet data found.

## 2021-05-07 NOTE — Progress Notes (Signed)
Ongoing trouble with expistaxis today despite using Afrin and packing with tranexamic acid.   ENT consulted and will see patient later this afternoon.

## 2021-05-07 NOTE — Progress Notes (Signed)
Patient with moderate nosebleed. Packing x1 today in Left nostril. PA informed. Awaiting response.

## 2021-05-07 NOTE — Consult Note (Addendum)
Consultation Note Date: 05/07/2021   Patient Name: [name]  DOB: 1951/03/21  MRN: [id number]  Age / Sex: 71 y.o., male  PCP: Sharion Settler, DO Referring Physician: Jolaine Artist, MD  Reason for Consultation:  end-stage HF  HPI/Patient Profile: 71 y.o. male  with past medical history of rheumatic heart disease, CHF, mitral valve replacement x 3, chronic a flutter, chronic kidney disease stage 4, chronic anemia, admitted on 05/05/2021 for R heart cath and IV diuresis for heart failure exacerbation. He is currently on milrinone. He is not a candidate for advanced heart failure therapies. Palliative medicine consulted for end-state HF.     Primary Decision Maker PATIENT- he would like his ex spouse- [name] to be his surrogate  Discussion: Chart reviewed including progress notes, labs, and imaging.  Met at the bedside with patient and ex spouse- [name].  Introduced Palliative medicine and the reason for Palliative medicine consult- Palliative medicine is specialized medical care for people living with serious illness. It focuses on providing relief from the symptoms and stress of a serious illness. The goal is to improve quality of life for both the patient and the family. Also discussed the need for advanced care planning in the setting of advanced chronic illness.  Cylas was lethargic during our discussion, and fell asleep several times while we were talking- he notes sleeping very poorly.  He discussed his heart failure diagnosis and the trajectory of heart failure over time. He is aware that there is no further interventions that can be done to improve his heart failure.  He shared that if he were unable to make decisions that he would want [name] to be his Media planner. He is agreeable to spiritual care assistance in creating an HCPOA document.  He and [name] stated for now  their primary goal is to optimize medically with current interventions and d/c to SNF for rehab and eventual d/c home.  I shared worries that he may decline again after milrinone is stopped as it is only a temporizing measure and can't be continued post discharge. We discussed the need to plan for the "what if" he is not successful at rehab and unable to return home on his own. We also discussed the need to for him to share what his definition of quality of life is, and his preferences regarding repeat hospitalizations vs hospice care and comfort.  Due to Alann's lethargy- he was unable to share his preferences during our discussion, however, he and [name] agreed to discuss further when he was more awake.  A copy of [facility] Cho[facility] was left with [name].     SUMMARY OF RECOMMENDATIONS -End stage heart failure without options for further interventions -Spiritual care referral for HCPOA document -Plan to f/u tomorrow at 11 for further discussion regarding goals of care    Code Status/Advance Care Planning: DNR   Prognosis:   Unable to determine  Discharge Planning: To Be Determined  Primary Diagnoses: Present on Admission:  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[place])  Review of Systems  Physical Exam Vitals and nursing note reviewed.  Constitutional:      Comments: Thin, frail  HENT:     Nose:     Comments: epistaxis Neurological:     Mental Status: He is oriented to person, place, and time.     Comments: lethargic    Vital Signs: BP (!) 100/55    Pulse 76    Temp 97.8 F (36.6 C) (Oral)    Resp 19    Ht '6\' 1"'  (1.854 m)    Wt 50.4 kg    SpO2 92%    BMI 14.66 kg/m  Pain Scale: 0-10 POSS *See Group Information*: 1-Acceptable,Awake and alert Pain Score: 0-No pain   SpO2: SpO2: 92 % O2 Device:SpO2: 92 % O2 Flow Rate: .   IO: Intake/output summary:  Intake/Output Summary (Last 24 hours) at 05/07/2021 1639 Last data filed at 05/07/2021 1531 Gross per 24 hour  Intake 547.96 ml   Output 3075 ml  Net -2527.04 ml    LBM: Last BM Date: 05/05/21 Baseline Weight: Weight: 54 kg Most recent weight: Weight: 50.4 kg        Thank you for this consult. Palliative medicine will continue to follow and assist as needed.    Signed by: Mariana Kaufman, AGNP-C Palliative Medicine    Please contact Palliative Medicine Team phone at [phone] for questions and concerns.  For individual provider: See Shea Evans

## 2021-05-08 DIAGNOSIS — Z7189 Other specified counseling: Secondary | ICD-10-CM | POA: Diagnosis not present

## 2021-05-08 DIAGNOSIS — N184 Chronic kidney disease, stage 4 (severe): Secondary | ICD-10-CM | POA: Diagnosis not present

## 2021-05-08 DIAGNOSIS — I50810 Right heart failure, unspecified: Secondary | ICD-10-CM | POA: Diagnosis not present

## 2021-05-08 LAB — BASIC METABOLIC PANEL
Anion gap: 12 (ref 5–15)
BUN: 57 mg/dL — ABNORMAL HIGH (ref 8–23)
CO2: 26 mmol/L (ref 22–32)
Calcium: 9.2 mg/dL (ref 8.9–10.3)
Chloride: 94 mmol/L — ABNORMAL LOW (ref 98–111)
Creatinine, Ser: 3.04 mg/dL — ABNORMAL HIGH (ref 0.61–1.24)
GFR, Estimated: 22 mL/min — ABNORMAL LOW (ref >60)
Glucose, Bld: 111 mg/dL — ABNORMAL HIGH (ref 70–99)
Potassium: 3.2 mmol/L — ABNORMAL LOW (ref 3.5–5.1)
Sodium: 132 mmol/L — ABNORMAL LOW (ref 135–145)

## 2021-05-08 LAB — CBC
HCT: 27.2 % — ABNORMAL LOW (ref 39.0–52.0)
Hemoglobin: 8.7 g/dL — ABNORMAL LOW (ref 13.0–17.0)
MCH: 31.8 pg (ref 26.0–34.0)
MCHC: 32 g/dL (ref 30.0–36.0)
MCV: 99.3 fL (ref 80.0–100.0)
Platelets: 89 10*3/uL — ABNORMAL LOW (ref 150–400)
RBC: 2.74 MIL/uL — ABNORMAL LOW (ref 4.22–5.81)
RDW: 34.3 % — ABNORMAL HIGH (ref 11.5–15.5)
WBC: 5.1 10*3/uL (ref 4.0–10.5)
nRBC: 0 % (ref 0.0–0.2)

## 2021-05-08 LAB — PROTIME-INR
INR: 2 — ABNORMAL HIGH (ref 0.8–1.2)
Prothrombin Time: 22.5 seconds — ABNORMAL HIGH (ref 11.4–15.2)

## 2021-05-08 MED ORDER — FUROSEMIDE 40 MG PO TABS
60.0000 mg | ORAL_TABLET | Freq: Every day | ORAL | Status: DC
  Administered 2021-05-09: 60 mg via ORAL
  Filled 2021-05-08: qty 1

## 2021-05-08 MED ORDER — WARFARIN SODIUM 5 MG PO TABS
5.0000 mg | ORAL_TABLET | Freq: Once | ORAL | Status: AC
  Administered 2021-05-08: 5 mg via ORAL
  Filled 2021-05-08: qty 1

## 2021-05-08 MED ORDER — AMOXICILLIN 250 MG PO CAPS
250.0000 mg | ORAL_CAPSULE | Freq: Two times a day (BID) | ORAL | Status: DC
  Administered 2021-05-08 – 2021-05-14 (×14): 250 mg via ORAL
  Filled 2021-05-08 (×16): qty 1

## 2021-05-08 MED ORDER — POTASSIUM CHLORIDE CRYS ER 20 MEQ PO TBCR
40.0000 meq | EXTENDED_RELEASE_TABLET | Freq: Four times a day (QID) | ORAL | Status: AC
  Administered 2021-05-08 (×2): 40 meq via ORAL
  Filled 2021-05-08 (×2): qty 2

## 2021-05-08 NOTE — Care Management Important Message (Signed)
Important Message  Patient Details  Name: JIMMY PLESSINGER MRN: [id number] Date of Birth: 11-24-1950   Medicare Important Message Given:  Yes     Shelda Altes 05/08/2021, 8:33 AM

## 2021-05-08 NOTE — Progress Notes (Signed)
Daily Progress Note   Patient Name: [name]       Date: 05/08/2021 DOB: 1951-03-08  Age: 71 y.o. MRN#: [id number] Attending Physician: Jolaine Artist, MD Primary Care Physician: Sharion Settler, DO Admit Date: 05/05/2021  Reason for Consultation/Follow-up: Establishing goals of care  Patient Profile/HPI:  71 y.o. male  with past medical history of rheumatic heart disease, CHF, mitral valve replacement x 3, chronic a flutter, chronic kidney disease stage 4, chronic anemia, admitted on 05/05/2021 for R heart cath and IV diuresis for heart failure exacerbation. He is currently on milrinone. He is not a candidate for advanced heart failure therapies. Palliative medicine consulted for end-state HF.     Subjective: Chart reviewed including progress notes, labs.  Dodge is more awake and alert today. Slept well last night. Eating well. No SOB. Nose is uncomfortable from packing. Katharine Look is at bedside.  They shared their understanding of information and plan given by Dr. Haroldine Laws last evening. Milrinone to be weaned today, then watch trajectory over the weekend. He will either be able to maintain his improvement, or he will decline. If he maintains they are hopeful for discharge either home with PT or facility for PT. If he declines we discussed then plan is for more comfort focused care.  They are hopeful for Enio to survive at least until June so that he can meet his new grandchildren who are being born.  He also very much values being independent.  We discussed the importance of keeping these goals and values centered when discussing care plans with providers.  Symptomatically- he denies SOB. Bowels are moving, he slept well. Discussed the impact of chronic illness on mood- he denies  depression or anxiety- but notes that "Monday was a bad day" as we was coming to the realization of his situation. However, he shares that he does not have any worries- what will be will be.  Katharine Look is supportive- notes Kirk is an introvert.  They are communicating with their sons and making plans for sons to come and visit sooner than later.    Review of Systems  Respiratory:  Negative for shortness of breath.   Psychiatric/Behavioral:  Negative for depression. The patient is not nervous/anxious.     Physical Exam Vitals and nursing note reviewed.  Constitutional:  Comments: frail  Pulmonary:     Effort: Pulmonary effort is normal.  Neurological:     General: No focal deficit present.     Mental Status: He is alert and oriented to person, place, and time.            Vital Signs: BP 103/61 (BP Location: Right Arm)    Pulse 79    Temp 97.7 F (36.5 C) (Axillary)    Resp 16    Ht 6\' 1"  (1.854 m)    Wt 49 kg    SpO2 95%    BMI 14.25 kg/m  SpO2: SpO2: 95 % O2 Device: O2 Device: Room Air O2 Flow Rate:    Intake/output summary:  Intake/Output Summary (Last 24 hours) at 05/08/2021 1149 Last data filed at 05/08/2021 8315 Gross per 24 hour  Intake --  Output 2125 ml  Net -2125 ml   LBM: Last BM Date: 05/07/21 Baseline Weight: Weight: 54 kg Most recent weight: Weight: 49 kg       Palliative Assessment/Data: PPS: 40%      Patient Active Problem List   Diagnosis Date Noted   Advanced care planning/counseling discussion    Protein-calorie malnutrition, severe 05/06/2021   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[place]) 05/05/2021   Pruritus 03/19/2021   Epistaxis, recurrent 03/19/2021   OSA (obstructive sleep apnea) 12/15/2020   Rhomboid muscle strain 09/05/2020   Hospital discharge follow-up 06/17/2020   History of hypokalemia 06/17/2020   CKD (chronic kidney disease) stage 4, GFR 15-29 ml/min ([place]) 06/17/2020   Onychomycosis 06/17/2020   H/O mitral valve replacement with  mechanical valve    Persistent atrial fibrillation (HCC)    Duodenal adenoma    Anticoagulated    Acute blood loss anemia 11/26/2019   Gastric polyp    Benign neoplasm of colon    Vertigo 06/07/2018   Acute on chronic diastolic heart failure ([place]) 03/04/2018   Cirrhosis ([place]) 05/08/2014   Chronic atrial flutter ([place]) 06/05/2013   Hepatitis C 06/05/2013   Chronic diastolic heart failure ([place]) 06/05/2013   Encounter for therapeutic drug monitoring 04/30/2013   Mechanical mitral valve with surgery x3 01/08/2013   History of colonic polyps 11/14/2012   Thoracic aortic aneurysm 05/02/2012    Palliative Care Assessment & Plan    Assessment/Recommendations/Plan  HCPOA to be completed today PMT will monitor chart for decompensation over weekend and f/u with patient on Monday   Code Status: DNR  Prognosis:  Unable to determine  Discharge Planning: To Be Determined  Care plan was discussed with patient and family member.   Thank you for allowing the Palliative Medicine Team to assist in the care of this patient.  Total time:  Prolonged billing:      Greater than 50%  of this time was spent counseling and coordinating care related to the above assessment and plan.  Mariana Kaufman, AGNP-C Palliative Medicine   Please contact Palliative Medicine Team phone at [phone] for questions and concerns.

## 2021-05-08 NOTE — Discharge Summary (Addendum)
Advanced Heart Failure Team  Discharge Summary   Patient ID: [name] MRN: [id number], DOB/AGE: [birth year] 71 y.o. 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    Recently with worsening HF symptoms. NYHA IIIB. [place] 1/31 showed elevated filling pressures and low output (see hemodynamics below). He was directly admitted from cath lab for IV diuretics and inotropic support w/ milrinone. Echo showed normal LVEF 50-55%, RV moderately enlarged w/ severely reduced systolic function. Mechanical prosthetic valve well seated. No evidence of mitral valve regurgitation or mitral stenosis. Mean mitral valve gradient 6.0 mmHg.  Volume status improved w/ diuresis. Unfortunately, advanced options very limited. He was not a transplant candidate w/ CKD 4. Not ideal candidate for home palliative inotropes given infection risk with mechanical MV. We discussed Code Status and he does not want heroic measures. CODE status changed DNR/DNI. Palliative care was consulted and patient opted for home hospice [facility] arranged at discharge.   Developed epistaxis and was seen by ENT. Had b/l nare packing. Underwent nasal cavity arterial embolization by IR on 02/10. R packing removed 02/11 and left packing removed 02/13. On abx with amoxicillin while pacing in place. 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 Diuretics held to worsening Creatinine. Placed on empiric milrinone with improved renal function.  Nephrology consulted. Dialysis was  avoided with addition of short term milrinone.   INR followed with adjusted made to anticoagulants. Follow made at the Coumadin Clinic later this week.   See below for detailed problem list   1.  Acute on chronic systolic CHF with low output/valvular HF with mixed moderate to severe PH/cor pulmonale in setting of longstanding MV disease - Echo 7/20  LVEF normal. RV severely dilated and HK. RVSP 55 - RHC 8/20 with significantly elevated biventricular pressures, moderate to severe mixed PAH and low output (CI 1.96) - M-spike negative. PYP 1/21 negative for amyloid.  - Echo (6/22): newly reduced EF 35-40%, RV severely reduced, moderate AR, mod/severe PR. (Dr. Haroldine Laws felt EF 50-55%). - Lexiscan stress MPI, 08/22: Partially reversible basal to mid anterior defect consistent w/ infarct and peri-infarct ischemia EF 54% - RHC on admit with elevated filling pressures and low output. CO 2.03. PA sat 43%. Prominent v waves on PCWP tracing.  - Echo 01/31: EF 50-55%, RV severely reduced, RVSP 59 mmHg, BAE, mean gradient 6 mmHg across mitral valve prosthesis, no MR - On admit was placed on milrinone for RV support.  HF stable. CIN post procedure CIN (received 120cc contrast).  Placed on empiric milrinone and later stopped.  - Hold spiro with CKD - No Entresto/ digoxin w/ CKD. - Continue  midodrine to 10 mg TID for BP and to support renal perfusion  - Given fraility and previous sternotomy x 3, not candidate for advanced therapies. Pallaitive Care now involved. Now DNR/DNI. Home with Hospice at discharge.   2. 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   - Echo 06/22: EF 35-40%, RV severely reduced, mechanical mitral valve  with trivial MR - Prominent v waves on PCWP on RHC. Mitral valve prosthesis appears okay on TTE  - Aware of need for SBE prophylaxis.  - INR  2.5  today. Stop heparin drip. He has follow up later this week in the Coumadin Clinic.  -- INR goal lowered to 2.5-3  d/t recurrent epistaxis.   3.  Chronic a flutter  -Rate a little higher on milrinone.  - Continue AC   4.  Thoracic aneurysm - CT chest 6/20 asc ao aorta 4.4 cm.   - 48 mm on echo this admit - Not surgical candidate.   5. AKI on Stage IV CKD - Followed by nephrology. - Baseline SCr~2.4 - C/w midodrine to support renal perfusion - SCr rising possible due to post-procedure CIN (received 120cc contrast).  - Creatinine peaked 4.7 . On the day of discharge creatinine was down to 3.2  - Continue midodrine to 10 mg TID - Nephrology consulted and fortunately dialysis was avoided.    6. 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 Negative for high-grade dysplasia/carcinoma - Followed routinely by GI.   7. Pancytopenia -Platelets 133  -Hgb 7.9 today .   8. OSA - Severe, newly diagnosed.    9. Epistaxis  - persistent nosebleed despite conservative measures - ENT consulted. Appreciate assistance. - S/p embolization by IR  2/10    10. FTT - Previously working with a nutritionist. - Nearing end-stage RV failure.  - Palliative Care consulted    11. DNR/DNI - Palliative Care Team appreciated.  - see discussion above   12. Deconditioning - Ambulate w/ PT - HH PT recommended    13. Hematuria, painless -  UA + hematuria. Urine cytology with no atypical urothelial cells.  -  Renal u/s - no mass or obstruction  -  Outpatient f/u with urology   14. Hyponatremia - Na 127    15. Hypokalemia -Stable today   Discharge Vitals: Blood pressure 112/65, pulse 100, temperature 98.2 F (36.8 C), temperature source Oral, resp. rate 18, height 6\' 1"  (1.854 m), weight 49.1 kg, SpO2 97 %.  Labs: Lab Results  Component Value Date   WBC 5.5 05/25/2021   HGB 7.9 (L) 05/25/2021   HCT 26.1 (L) 05/25/2021   MCV 101.6 (H) 05/25/2021   PLT 133 (L) 05/25/2021    Recent Labs  Lab 05/25/21 0413  NA 127*  K 4.2  CL 93*  CO2 21*  BUN 57*  CREATININE 3.24*  CALCIUM 9.0   GLUCOSE 97   No results found for: CHOL, HDL, LDLCALC, TRIG BNP (last 3 results) Recent Labs    04/24/21 1231 04/30/21 1235  BNP 1,444.9* 898.1*    ProBNP (last 3 results) Recent Labs    07/25/20 1036  PROBNP 8,332*     Diagnostic Studies/Procedures   Endovascular epistaxis embolization, 05/15/21 IMPRESSION: 1. Endovascular embolization of the bilateral sphenoid palatine and facial arteries, as detailed above. 2. 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  [place] 05/05/21 Findings:   RA = 15 RV = 70/14 PA = 70/29 (46) PCW = 22 (v = 35) Fick cardiac output/index = 3.5/2.0 PVR = 7.4 WU FA sat = 97% PA sat = 42%, 44% PAPi = 2.7   Assessment: 1. 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 Prominent v- waves in PCW tracing suggestive of significant MR vs severe diastolic dysfunction (may need TEE to further evlauate)  3. MV leaflet excursion appears normal on fluoro  Echo 05/05/21 IMPRESSIONS   1. Left ventricular ejection fraction, by estimation, is 50 to 55%. The  left ventricle has low normal function. The left ventricle demonstrates global hypokinesis. There is moderate left ventricular hypertrophy. Left ventricular diastolic parameters are indeterminate.   2. Right ventricular systolic function is severely reduced. The right ventricular size is moderately enlarged. There is moderately elevated pulmonary artery systolic pressure. The estimated right ventricular systolic pressure is 35.3 mmHg.   3. Left atrial size was severely dilated.   4. Right atrial size was moderately dilated.   5. The mitral valve has been repaired/replaced. No evidence of mitral valve regurgitation. No evidence of mitral stenosis. The mean mitral valve gradient is 6.0 mmHg. There is a mechanical valve present in the mitral position. Echo findings are consistent with normal structure and  function of the mitral valve prosthesis.   6. The aortic valve is tricuspid. There is moderate calcification of the aortic valve. There is moderate thickening of the aortic valve. Aortic valve regurgitation is moderate. Aortic valve sclerosis/calcification is present, without any evidence of aortic stenosis.   7. Aortic dilatation noted. There is moderate dilatation of the ascending  aorta, measuring 48 mm. There is moderate dilatation of the aortic root, measuring 47 mm.   8. The inferior vena cava is normal in size with greater than 50% respiratory variability, suggesting right atrial pressure of 3 mmHg.    Discharge Medications   Allergies as of 05/25/2021       Reactions   Penicillins Rash   **tolerated amoxicillin Feb 2023**  Did it involve swelling of the face/tongue/throat, SOB, or low BP? No Did it involve sudden or severe rash/hives, skin peeling, or any reaction on the inside of your mouth or nose? No Did you need to seek medical attention at a hospital or doctor's office? No When did it last happen?      30 + years If all above answers are "NO", may proceed with cephalosporin use.        Medication List     STOP taking these medications    enoxaparin 60 MG/0.6ML injection Commonly known as: LOVENOX   metolazone 2.5 MG tablet Commonly known as: ZAROXOLYN   sildenafil 20 MG tablet Commonly known as: REVATIO   spironolactone 25 MG tablet Commonly known as: ALDACTONE       TAKE these medications    amoxicillin 500 MG capsule Commonly known as: AMOXIL TAKE 4 CAPSULES BY MOUTH 1 HOUR BEFORE DENTAL APPOINTMENT   Efinaconazole 10 % Soln Apply to toenail daily for 48 weeks   ferrous sulfate 325 (65 FE) MG tablet Take 1 tablet (325 mg total) by mouth every other day. In the evening.   furosemide 40 MG tablet Commonly known as: LASIX Take 1 tablet (40 mg total) by mouth daily. Start taking on: May 26, 2021   hydrOXYzine 10 MG tablet Commonly known as:  ATARAX Take 1 tablet (10 mg total) by mouth 3 (three) times daily as needed.   midodrine 10 MG tablet Commonly known as: PROAMATINE Take 1 tablet (10 mg total) by mouth 3 (three) times daily with meals.   multivitamin with minerals tablet Take 1 tablet by mouth daily.   polyethylene glycol 17 g packet Commonly known as: MiraLax Take 17 g by mouth daily as needed.   potassium chloride SA 20 MEQ tablet Commonly known as: KLOR-CON M Take 2 tablets (40 mEq total) by mouth daily.  Testosterone 1.62 % Gel Apply 2 Pump topically daily. 1 pump on each arm   warfarin 5 MG tablet Commonly known as: COUMADIN Take as directed. If you are unsure how to take this medication, talk to your nurse or doctor. Original instructions: Take 1/2 a tablet to 1 tablet by mouth daily as directed by the coumadin clinic. What changed:  how much to take how to take this when to take this additional instructions        Disposition   The patient will be discharged in stable condition to home. Discharge Instructions     (HEART FAILURE PATIENTS) Call MD:  Anytime you have any of the following symptoms: 1) 3 pound weight gain in 24 hours or 5 pounds in 1 week 2) shortness of breath, with or without a dry hacking cough 3) swelling in the hands, feet or stomach 4) if you have to sleep on extra pillows at night in order to breathe.   Complete by: As directed    Diet - low sodium heart healthy   Complete by: As directed    Increase activity slowly   Complete by: As directed    Increase activity slowly   Complete by: As directed    STOP any activity that causes chest pain, shortness of breath, dizziness, sweating, or exessive weakness   Complete by: As directed        Follow-up Information     [facility] Follow up.   Why: Home Hospice-agency will call to arrange initial visit Contact information: [place] [address] [phone]         [facility] Follow up.   Specialty: Cardiology Contact information: [address] [id number] [place]        [place] Office Follow up on 05/14/2021.   Specialty: Cardiology Why: Coumadin Clinic (:15 Contact information: [address], [place] [phone]                  Duration of Discharge Encounter: Greater than 35 minutes   Signed, Darrick Grinder, NP-C  05/25/2021, 10:21 AM  Agree with above. [place] for d/c today. See today's progress note for full details.   Total time spent 40 minutes. Over half that time spent discussing above.   Glori Bickers, MD  2:40 PM

## 2021-05-08 NOTE — Evaluation (Signed)
Physical Therapy Evaluation Patient Details Name: [name] MRN: [id number] DOB: 12/30/1950 Today's Date: 05/08/2021  History of Present Illness  71 y.o. male presents to [facility] hospital on 05/05/2021 for evaluation of pulmonary hypertension. Pt underwent RHC on 05/05/2021. Pt developed nose bleed on 2/2. PMH includes mitral valve replacement x3, hepatitis C, a flutter, AAA, CKDIII, anemia.  Clinical Impression  Pt presents to PT with deficits in strength, power, endurance, balance, and gait. Pt reports difficulty negotiating stairs and taking his trash out at home due to fatigue. Pt is able to ambulate for household distances at this time without loss of balance. PT provides education on energy conservation strategies including use of a shower seat and consideration of purchasing a 4 wheeled walker with seat (pt is not open to the walker at this time). Pt is encouraged to attempt to perform as many of his regular daily activities as possible over this weekend to assess what challenges him so he can better prepare for discharge home. PT will continue to follow during this admission.       Recommendations for follow up therapy are one component of a multi-disciplinary discharge planning process, led by the attending physician.  Recommendations may be updated based on patient status, additional functional criteria and insurance authorization.  Follow Up Recommendations Home health PT    Assistance Recommended at Discharge Intermittent Supervision/Assistance  Patient can return home with the following  Help with stairs or ramp for entrance;Assistance with cooking/housework    Equipment Recommendations Other (comment) (shower seat)  Recommendations for Other Services       Functional Status Assessment Patient has had a recent decline in their functional status and demonstrates the ability to make significant improvements in function in a reasonable and predictable amount of time.      Precautions / Restrictions Precautions Precautions: Fall Precaution Comments: monitor SpO2 Restrictions Weight Bearing Restrictions: No      Mobility  Bed Mobility Overal bed mobility: Independent                  Transfers Overall transfer level: Independent                      Ambulation/Gait Ambulation/Gait assistance: Supervision Gait Distance (Feet): 150 Feet Assistive device: None Gait Pattern/deviations: Step-through pattern Gait velocity: reduced Gait velocity interpretation: 1.31 - 2.62 ft/sec, indicative of limited community ambulator   General Gait Details: mild lateral sway during gait, no LOB noted.  Stairs Stairs:  (pt declines mobility out of the room)          Wheelchair Mobility    Modified Rankin (Stroke Patients Only)       Balance Overall balance assessment: Mild deficits observed, not formally tested                                           Pertinent Vitals/Pain Pain Assessment Pain Assessment: No/denies pain    Home Living Family/patient expects to be discharged to:: Private residence Living Arrangements: Alone Available Help at Discharge: Other (Comment);Available PRN/intermittently (ex-wife) Type of Home: Apartment Home Access: Stairs to enter Entrance Stairs-Rails: Right;Left Entrance Stairs-Number of Steps: flight   Home Layout: One level Home Equipment: None      Prior Function Prior Level of Function : Independent/Modified Independent;Driving             Mobility Comments: pt  reports he has been limited to household and very limited distances in the community. Pt reports fatigue with stair negotiation and with taking his trash to the dumpster       Hand Dominance   Dominant Hand: Left    Extremity/Trunk Assessment   Upper Extremity Assessment Upper Extremity Assessment: Overall WFL for tasks assessed    Lower Extremity Assessment Lower Extremity Assessment:  Generalized weakness    Cervical / Trunk Assessment Cervical / Trunk Assessment: Kyphotic  Communication   Communication: No difficulties  Cognition Arousal/Alertness: Awake/alert Behavior During Therapy: WFL for tasks assessed/performed Overall Cognitive Status: Within Functional Limits for tasks assessed                                          General Comments General comments (skin integrity, edema, etc.): VSS on RA, sats in high 90s with ambulation. Pt denies SOB    Exercises     Assessment/Plan    PT Assessment Patient needs continued PT services  PT Problem List Decreased strength;Decreased activity tolerance;Decreased balance;Decreased mobility;Cardiopulmonary status limiting activity       PT Treatment Interventions DME instruction;Gait training;Stair training;Therapeutic activities;Therapeutic exercise;Balance training;Patient/family education    PT Goals (Current goals can be found in the Care Plan section)  Acute Rehab PT Goals Patient Stated Goal: to return home PT Goal Formulation: With patient Time For Goal Achievement: 05/22/21 Potential to Achieve Goals: Good Additional Goals Additional Goal #1: Pt will score >19/24 on DGI to indicate a reduced risk for falls Additional Goal #2: Pt will negotiate one flight of steps while reporting 3/10 DOE or less to aide in improving his ability to mobilize at a community level    Frequency Min 3X/week     Co-evaluation               AM-PAC PT "6 Clicks" Mobility  Outcome Measure Help needed turning from your back to your side while in a flat bed without using bedrails?: None Help needed moving from lying on your back to sitting on the side of a flat bed without using bedrails?: None Help needed moving to and from a bed to a chair (including a wheelchair)?: None Help needed standing up from a chair using your arms (e.g., wheelchair or bedside chair)?: None Help needed to walk in hospital  room?: A Little Help needed climbing 3-5 steps with a railing? : A Little 6 Click Score: 22    End of Session   Activity Tolerance: Patient tolerated treatment well Patient left: in bed;with call bell/phone within reach Nurse Communication: Mobility status PT Visit Diagnosis: Muscle weakness (generalized) (M62.81);Other abnormalities of gait and mobility (R26.89)    Time: 3893-7342 PT Time Calculation (min) (ACUTE ONLY): 23 min   Charges:   PT Evaluation $PT Eval Low Complexity: [place], PT, DPT Acute Rehabilitation Pager: [phone] Office [phone]   Zenaida Niece 05/08/2021, 4:51 PM

## 2021-05-08 NOTE — Progress Notes (Addendum)
[place] for warfarin Indication:  mechanical mitral valve, chronic aflutter  Allergies  Allergen Reactions   Penicillins Rash    Did it involve swelling of the face/tongue/throat, SOB, or low BP? No Did it involve sudden or severe rash/hives, skin peeling, or any reaction on the inside of your mouth or nose? No Did you need to seek medical attention at a hospital or doctor's office? No When did it last happen?      30 + years If all above answers are NO, may proceed with cephalosporin use.     Patient Measurements: Height: 6' 1" (185.4 cm) Weight: 49 kg (108 lb 0.4 oz) IBW/kg (Calculated) : 79.9  Vital Signs: Temp: 97.5 F (36.4 C) (02/03 1212) Temp Source: Oral (02/03 1212) BP: 115/67 (02/03 1212) Pulse Rate: 78 (02/03 1212)  Labs: Recent Labs    05/06/21 0645 05/07/21 0208 05/08/21 0224  HGB 8.0* 9.1* 8.7*  HCT 25.0* 28.0* 27.2*  PLT 68* 79* 89*  LABPROT 35.6* 31.1* 22.5*  INR 3.6* 3.0* 2.0*  CREATININE 3.10* 2.92* 3.04*     Estimated Creatinine Clearance: 16.1 mL/min (A) (by C-G formula based on SCr of 3.04 mg/dL (H)).   Medical History: Past Medical History:  Diagnosis Date   Allergy    Aneurysm of ascending aorta    Atrial fibrillation (HCC)    BPH (benign prostatic hyperplasia)    Colon polyps    FHx: rheumatic heart disease    H/O diplopia    Hepatitis C    Hypertension    Hypogonadism male    Mitral valve disease    mitral valve repair/replacement x3   OSA (obstructive sleep apnea) 12/15/2020    Assessment: 71 year old male with history of mitral valve replacement x 3, currently with mechanical valve. Patient being admitted after RHC. INR elevated at 3.7. No cbc done. No hematoma noted post cath.   INR now down to 2 below goal with low dose last two days. Patient still c/o nose bleeds but ENT consulted and packing now in place. He is being placed on amoxicillin as he states he has tolerated this prior  to dental procedures in the past while packing in place.. Saline nasal spray - risnse, Afrin and  TXA epistaxis kit x2. CBC stable. No other signs of bleeding noted.   Warfarin dosing prior to admit was 87m MWF and 2.5296mall other days, which was adjusted for cath. Prior visit shows regimen as 2.96m9mn Sun/Thur and 96mg42ml other days. Unable to bridge with heparin d/t severe nose bleeds.  Goal of Therapy:  INR goal 2.5-3.5 Monitor platelets by anticoagulation protocol: Yes   Plan:  Give warfarin 96mg 70mprevent further drop in INR with mechanical valve.  Follow up on resolution of nose bleeds - ENT following, packing in place  FrankErin HearingmD., BCPS Clinical Pharmacist 05/08/2021 12:49 PM

## 2021-05-08 NOTE — Progress Notes (Addendum)
Advanced Heart Failure Rounding Note  PCP-Cardiologist: Sinclair Grooms, MD   Subjective:   2/2 ENT consulted for nose bleed. L nare packed.   Remains on milrinone 0.125. Negative 1.7.  Wt down another 3 pounds.   Scr 3.24 > 3.10>>2.92>>3.04  BUN 48>51>57    Denies SOB. Nose bleed slowed and has resolved. He is able to take amoxicillin and is ok to take.   Objective:   Weight Range: 49 kg Body mass index is 14.25 kg/m.   Vital Signs:   Temp:  [97.5 F (36.4 C)-98.5 F (36.9 C)] 97.5 F (36.4 C) (02/03 1212) Pulse Rate:  [78-81] 78 (02/03 1212) Resp:  [15-23] 20 (02/03 1212) BP: (90-115)/(36-72) 115/67 (02/03 1212) SpO2:  [94 %-100 %] 98 % (02/03 1212) Weight:  [49 kg] 49 kg (02/03 0200) Last BM Date: 05/07/21  Weight change: Filed Weights   05/06/21 0400 05/07/21 0403 05/08/21 0200  Weight: 51.3 kg 50.4 kg 49 kg    Intake/Output:   Intake/Output Summary (Last 24 hours) at 05/08/2021 1222 Last data filed at 05/08/2021 0721 Gross per 24 hour  Intake --  Output 2125 ml  Net -2125 ml      Physical Exam  General:  Thin.  No resp difficulty HEENT: normal. Left nare packed.  Neck: supple. JVP 6-7 . Carotids 2+ bilat; no bruits. No lymphadenopathy or thryomegaly appreciated. Cor: PMI nondisplaced. Irregular rate & rhythm. No rubs, gallops or murmurs. Lungs: clear Abdomen: soft, nontender, nondistended. No hepatosplenomegaly. No bruits or masses. Good bowel sounds. Extremities: no cyanosis, clubbing, rash, edema Neuro: alert & orientedx3, cranial nerves grossly intact. moves all 4 extremities w/o difficulty. Affect pleasant   Telemetry  Chronic A flutter 90s with occasional PVCs.    Labs    CBC Recent Labs    05/07/21 0208 05/08/21 0224  WBC 5.2 5.1  HGB 9.1* 8.7*  HCT 28.0* 27.2*  MCV 100.0 99.3  PLT 79* 89*   Basic Metabolic Panel Recent Labs    05/05/21 1506 05/06/21 0645 05/07/21 0208 05/08/21 0224  NA 134* 133* 133* 132*  K 3.4*  2.9* 4.6 3.2*  CL 101 100 97* 94*  CO2 24 24 25 26   GLUCOSE 100* 92 94 111*  BUN 51* 48* 51* 57*  CREATININE 3.24* 3.10* 2.92* 3.04*  CALCIUM 8.9 8.8* 9.0 9.2  MG 2.4 2.1  --   --    Liver Function Tests Recent Labs    05/06/21 0645  AST 155*  ALT 38  ALKPHOS 66  BILITOT 3.2*  PROT 7.8  ALBUMIN 3.3*   No results for input(s): LIPASE, AMYLASE in the last 72 hours. Cardiac Enzymes No results for input(s): CKTOTAL, CKMB, CKMBINDEX, TROPONINI in the last 72 hours.  BNP: BNP (last 3 results) Recent Labs    04/24/21 1231 04/30/21 1235  BNP 1,444.9* 898.1*    ProBNP (last 3 results) Recent Labs    07/25/20 1036  PROBNP 8,332*     D-Dimer No results for input(s): DDIMER in the last 72 hours. Hemoglobin A1C No results for input(s): HGBA1C in the last 72 hours. Fasting Lipid Panel No results for input(s): CHOL, HDL, LDLCALC, TRIG, CHOLHDL, LDLDIRECT in the last 72 hours. Thyroid Function Tests No results for input(s): TSH, T4TOTAL, T3FREE, THYROIDAB in the last 72 hours.  Invalid input(s): FREET3  Other results:   Imaging    No results found.   Medications:     Scheduled Medications:  feeding supplement  237 mL  Oral TID BM   furosemide  80 mg Intravenous BID   midodrine  5 mg Oral TID WC   multivitamin  1 tablet Oral QHS   polyethylene glycol  17 g Oral Daily   sodium chloride flush  3 mL Intravenous Q12H   Warfarin - Pharmacist Dosing Inpatient   Does not apply q1600    Infusions:  sodium chloride     milrinone 0.125 mcg/kg/min (05/06/21 2039)    PRN Medications: sodium chloride, acetaminophen, ondansetron (ZOFRAN) IV, oxymetazoline, sodium chloride, sodium chloride flush, traZODone    Patient Profile   71 y.o. 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 Admitted with acute on chronic CHF.  Assessment/Plan   1.  Acute on chronic systolic CHF with low  output/valvular HF with mixed moderate to severe PH/cor pulmonale in setting of longstanding MV disease - Echo 7/20  LVEF normal. RV severely dilated and HK. RVSP 55 - RHC 8/20 with significantly elevated biventricular pressures, moderate to severe mixed PAH and low output (CI 1.96) - M-spike negative. PYP 1/21 negative for amyloid.  - Echo (6/22): newly reduced EF 35-40%, RV severely reduced, moderate AR, mod/severe PR. (Dr. Haroldine Laws felt EF 50-55%). - Lexiscan stress MPI, 08/22: Partially reversible basal to mid anterior defect consistent w/ infarct and peri-infarct ischemia EF 54% - RHC today with elevated filling pressures and low output. CO 2.03. PA sat 43%. Prominent v waves on PCWP tracing.  - Echo 01/31: EF 50-55%, RV severely reduced, RVSP 59 mmHg, BAE, mean gradient 6 mmHg across mitral valve prosthesis, no MR - On milrinone 0.125 for RV support. Will stop today.  Supp K.  - Volume status improved. Stop IV lasix. Tomorrow start lasix 60 mg po daily.  - Hold spiro with AKI on CKD - No Entresto/ digoxin w/ CKD. - Continue midodrine 5 TID for BP support  - Advanced options very limited. Likely not transplant candidate w/ CKD 4. Not ideal candidate for home palliative inotropes given infection risk with mechanical MV. We discussed Code Status and he does not want heroic measures. Now DNR/DNI   2. 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   - Echo 06/22: EF 35-40%, RV severely reduced, mechanical mitral valve with trivial MR - Prominent v waves on PCWP on RHC. Mitral valve prosthesis appears okay on TTE  - Due to nose bleed coumadin was held.  - Aware of need for SBE prophylaxis.    3.  Chronic a flutter  - Rate controlled, on Coumadin.  - INR 2.    4.  Thoracic aneurysm, followed by Dr. Cyndia Bent  - CT chest 6/20 asc ao aorta 4.4 cm.   - 48 mm on echo this admit - Not surgical candidate.   5. AKI on Stage IV CKD - Followed by nephrology. - Baseline  SCr~2.4. - Scr 3.24>3.1>2.92> 3.04  - Suspect cardiorenal - C/w midodrine to support renal perfusion   6. 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 Negative for high-grade dysplasia/carcinoma - Followed routinely by GI.  7. Anemia -Hgb 9.9>8.7>8.0>9.1 >8.7 - Nose bleed seems to be resolving.   8. Thrombocytopenia -Chronic. Platelets 68>>79K >>89 today   9. OSA - Severe, newly diagnosed.  - Awaiting CPAP titration.  - Dr. Radford Pax following.   10. Epistasis  - persistent nosebleed despite conservative measures - ENT consulted. Had L nare packed. - Start amoxicillin while he has nose packing.  11. FTT - Previously working with a nutritionist. - Nearing end-stage RV failure.  - Palliative Care consulted   12. DNR/DNI Palliative Care Team appreciated.   13. Deconditioning Consult PT.   14. Severe protein calorie malnutrition  Length of Stay: 3  Amy Clegg, NP  05/08/2021, 12:22 PM  Advanced Heart Failure Team Pager [phone] (M-F; 7a - 5p)  Please contact [facility] for night-coverage after hours (5p -7a ) and weekends on [email or web address]  Patient seen and examined with the above-signed Advanced Practice Provider and/or Housestaff. I personally reviewed laboratory data, imaging studies and relevant notes. I independently examined the patient and formulated the important aspects of the plan. I have edited the note to reflect any of my changes or salient points. I have personally discussed the plan with the patient and/or family.  On milrinone. Breathing better. Has diuresed well. SCr stable ~3.0. Epistaxis resolved with nasal packing.   General:  Cachetic. Weak appearing. No resp difficulty HEENT: normal  nasal packing on left Neck: supple. JVP 8. Carotids 2+ bilat; no bruits. No lymphadenopathy or thryomegaly appreciated. Cor: PMI nondisplaced. Regular rate & rhythm. Mechanical s1 Lungs: clear Abdomen: soft, nontender, nondistended. No hepatosplenomegaly. No  bruits or masses. Good bowel sounds. Extremities: no cyanosis, clubbing, rash, edema Neuro: alert & orientedx3, cranial nerves grossly intact. moves all 4 extremities w/o difficulty. Affect pleasant  Volume status probably as good as we can get it. Not candidate for advanced therapies. Will stop milrinone. Switch back to po diuretics. Palliative Care now following. Suspect home hospice likely best plan. Continue asal packing x 5 days. Start amoxicillin. (Watch INR)  Glori Bickers, MD  2:45 PM

## 2021-05-08 NOTE — Progress Notes (Signed)
This chaplain responded to PMT consult for creating/updating the Pt. Advance Directive:HCPOA. The Pt. and Pt. ex-wife-Sandra Ruthann Cancer participated in [facility] education and answered the chaplain's clarifying questions.    The chaplain understands the Pt. and Katharine Look desire an hour to talk among themselves. The Pt. left the document with the Pt. and will F/U.  This chaplain is available for F/U spiritual care as needed.  Chaplain Sallyanne Kuster [phone]

## 2021-05-08 NOTE — Progress Notes (Signed)
Subjective: No issues overnight.  No nosebleed.  Objective: Vital signs in last 24 hours: Temp:  [97.9 F (36.6 C)-98.5 F (36.9 C)] 98.2 F (36.8 C) (02/03 0400) Pulse Rate:  [76-81] 81 (02/02 1943) Resp:  [15-23] 20 (02/03 0600) BP: (90-114)/(36-99) 105/63 (02/03 0600) SpO2:  [92 %-100 %] 98 % (02/03 0400) Weight:  [49 kg] 49 kg (02/03 0200)  Physical Exam: General appearance: alert and cooperative Head: Normocephalic, without obvious abnormality, atraumatic Eyes: Pupils are equal, round, reactive to light. Extraocular motion is intact.  Ears: Examination of the ears shows normal auricles and external auditory canals bilaterally.  Nose: Nasal examination shows left nasal packing in place. No acute bleeding. Face: Facial examination shows no asymmetry. Palpation of the face elicit no significant tenderness.  Mouth: Oral cavity examination shows no mucosal lacerations. No significant trismus is noted.  Neck: Palpation of the neck reveals no lymphadenopathy or mass. The trachea is midline.   Recent Labs    05/07/21 0208 05/08/21 0224  WBC 5.2 5.1  HGB 9.1* 8.7*  HCT 28.0* 27.2*  PLT 79* 89*   Recent Labs    05/07/21 0208 05/08/21 0224  NA 133* 132*  K 4.6 3.2*  CL 97* 94*  CO2 25 26  GLUCOSE 94 111*  BUN 51* 57*  CREATININE 2.92* 3.04*  CALCIUM 9.0 9.2    Medications: I have reviewed the patient's current medications. Scheduled:  feeding supplement  237 mL Oral TID BM   furosemide  80 mg Intravenous BID   midodrine  5 mg Oral TID WC   multivitamin  1 tablet Oral QHS   polyethylene glycol  17 g Oral Daily   sodium chloride flush  3 mL Intravenous Q12H   Warfarin - Pharmacist Dosing Inpatient   Does not apply q1600   Continuous:  sodium chloride     milrinone 0.125 mcg/kg/min (05/06/21 2039)    Assessment/Plan: Recurrent left epistaxis. No bleeding overnight. -Bleeding controlled with left anterior/posterior packing. -Will leave the Merocel packing in  place for 5 days. -Will need gram-positive antibiotic coverage (eg.  Keflex, clindamycin) while the packing is in place. -Follow-up information given to the patient.   LOS: 3 days   Prentis Langdon W Goble Fudala 05/08/2021, 7:27 AM

## 2021-05-08 NOTE — Progress Notes (Signed)
This chaplain is present with the Pt., Pt. ex-wife Katharine Look, notary, and two witnesses for the notarizing of the Pt. Advance Directive:  HCPOA and Living Will.  The Pt. named Willa Rough as his HCPOA. If the HCPOA is unable or unwilling to serve in this role the Pt. next choice is Alyson Locket.  The chaplain gave the Pt. the orignial AD along with two copies. The chaplain scanned the Pt. AD into EMR.  The chaplain is available for F/U spiritual care as needed.  Chaplain Sallyanne Kuster [phone]

## 2021-05-09 DIAGNOSIS — N184 Chronic kidney disease, stage 4 (severe): Secondary | ICD-10-CM

## 2021-05-09 DIAGNOSIS — Z7189 Other specified counseling: Secondary | ICD-10-CM | POA: Diagnosis not present

## 2021-05-09 DIAGNOSIS — I50810 Right heart failure, unspecified: Secondary | ICD-10-CM | POA: Diagnosis not present

## 2021-05-09 LAB — BASIC METABOLIC PANEL
Anion gap: 12 (ref 5–15)
BUN: 65 mg/dL — ABNORMAL HIGH (ref 8–23)
CO2: 24 mmol/L (ref 22–32)
Calcium: 9.2 mg/dL (ref 8.9–10.3)
Chloride: 95 mmol/L — ABNORMAL LOW (ref 98–111)
Creatinine, Ser: 3.03 mg/dL — ABNORMAL HIGH (ref 0.61–1.24)
GFR, Estimated: 22 mL/min — ABNORMAL LOW (ref >60)
Glucose, Bld: 103 mg/dL — ABNORMAL HIGH (ref 70–99)
Potassium: 3.9 mmol/L (ref 3.5–5.1)
Sodium: 131 mmol/L — ABNORMAL LOW (ref 135–145)

## 2021-05-09 LAB — CBC
HCT: 26.5 % — ABNORMAL LOW (ref 39.0–52.0)
Hemoglobin: 8.8 g/dL — ABNORMAL LOW (ref 13.0–17.0)
MCH: 33 pg (ref 26.0–34.0)
MCHC: 33.2 g/dL (ref 30.0–36.0)
MCV: 99.3 fL (ref 80.0–100.0)
Platelets: 95 10*3/uL — ABNORMAL LOW (ref 150–400)
RBC: 2.67 MIL/uL — ABNORMAL LOW (ref 4.22–5.81)
WBC: 5.5 10*3/uL (ref 4.0–10.5)
nRBC: 0 % (ref 0.0–0.2)

## 2021-05-09 LAB — HEPARIN LEVEL (UNFRACTIONATED): Heparin Unfractionated: <0.1 IU/mL — ABNORMAL LOW (ref 0.30–0.70)

## 2021-05-09 LAB — PROTIME-INR
INR: 1.6 — ABNORMAL HIGH (ref 0.8–1.2)
Prothrombin Time: 18.9 seconds — ABNORMAL HIGH (ref 11.4–15.2)

## 2021-05-09 MED ORDER — WARFARIN SODIUM 5 MG PO TABS
5.0000 mg | ORAL_TABLET | Freq: Once | ORAL | Status: AC
  Administered 2021-05-09: 5 mg via ORAL
  Filled 2021-05-09: qty 1

## 2021-05-09 MED ORDER — FUROSEMIDE 40 MG PO TABS
80.0000 mg | ORAL_TABLET | Freq: Every day | ORAL | Status: DC
  Administered 2021-05-09 – 2021-05-16 (×7): 80 mg via ORAL
  Filled 2021-05-09 (×7): qty 2

## 2021-05-09 MED ORDER — HEPARIN (PORCINE) 25000 UT/250ML-% IV SOLN
400.0000 [IU]/h | INTRAVENOUS | Status: DC
  Administered 2021-05-09: 400 [IU]/h via INTRAVENOUS
  Filled 2021-05-09: qty 250

## 2021-05-09 NOTE — Progress Notes (Addendum)
[facility] for warfarin Indication:  mechanical mitral valve, chronic aflutter  Allergies  Allergen Reactions   Penicillins Rash    Did it involve swelling of the face/tongue/throat, SOB, or low BP? No Did it involve sudden or severe rash/hives, skin peeling, or any reaction on the inside of your mouth or nose? No Did you need to seek medical attention at a hospital or doctor's office? No When did it last happen?      30 + years If all above answers are NO, may proceed with cephalosporin use.     Patient Measurements: Height: 6\' 1"  (185.4 cm) Weight: 50 kg (110 lb 3.2 oz) IBW/kg (Calculated) : 79.9  Vital Signs: Temp: 98.2 F (36.8 C) (02/04 0730) Temp Source: Oral (02/04 0730) BP: 98/70 (02/04 0730) Pulse Rate: 68 (02/04 0730)  Labs: Recent Labs    05/07/21 0208 05/08/21 0224 05/09/21 0342  HGB 9.1* 8.7* 8.8*  HCT 28.0* 27.2* 26.5*  PLT 79* 89* 95*  LABPROT 31.1* 22.5* 18.9*  INR 3.0* 2.0* 1.6*  CREATININE 2.92* 3.04* 3.03*     Estimated Creatinine Clearance: 16.5 mL/min (A) (by C-G formula based on SCr of 3.03 mg/dL (H)).   Medical History: Past Medical History:  Diagnosis Date   Allergy    Aneurysm of ascending aorta    Atrial fibrillation (HCC)    BPH (benign prostatic hyperplasia)    Colon polyps    FHx: rheumatic heart disease    H/O diplopia    Hepatitis C    Hypertension    Hypogonadism male    Mitral valve disease    mitral valve repair/replacement x3   OSA (obstructive sleep apnea) 12/15/2020    Assessment: 71 year old male with history of mitral valve replacement x 3, currently with mechanical valve. Patient being admitted after RHC. INR elevated initially at 3.7. No hematoma noted post cath.   INR continues to be subtherapeutic, trending down to 1.6. Patient had nose bleeds after cath. ENT consulted and packing now in place. He is being placed on amoxicillin as he states he has tolerated this prior to  dental procedures in the past while packing in place.. Patient has received saline nasal spray risnse, Afrin and TXA epistaxis. Patient reported no bleeding this morning, however woke up with some blood on his pillow. CBC stable. No other signs of bleeding noted.  After speaking with the heart failure team, due to subtherapeutic INR, mechanical mitral valve, and RHD, plan to start heparin infusion at 400 units/hr. Only titrated per HF team.   Warfarin dosing prior to admit was 5mg  MWF and 2.5mg  all other days, which was adjusted for cath. Prior visit shows regimen as 2.5mg  on Sun/Thur and 5mg  all other days. Unable to bridge with heparin d/t severe nose bleeds.  Goal of Therapy:  INR goal 2.5-3.5 Monitor platelets by anticoagulation protocol: Yes   Plan:  Give warfarin 5mg  to prevent further drop in INR with mechanical valve.  Start heparin fixed dose at 400 units/hr Follow up on resolution of nose bleeds - ENT following, packing in place  Thank you for allowing pharmacy to participate in this patient's care. Daily INR and CBC  Reatha Harps, PharmD PGY1 Pharmacy Resident 05/09/2021 9:30 AM Check [email or web address] for unit specific pharmacy number  ADDENDUM:  Nurse reports urethral bleeding when voiding. Continue low dose heparin as embolic risk is greater than bleed risk at this time.

## 2021-05-09 NOTE — Progress Notes (Signed)
Advanced Heart Failure Rounding Note  PCP-Cardiologist: Sinclair Grooms, MD   Subjective:   2/2 ENT consulted for nose bleed. L nare packed.   Milrinone stopped yesterday. Weight stable.   Feels ok. Main complaint is that R nare now bleeding. Denies SOB, orthopnea or PND.  Scr 3.24 > 3.10>>2.92>>3.04 > 3.03   Objective:   Weight Range: 50 kg Body mass index is 14.54 kg/m.   Vital Signs:   Temp:  [97.4 F (36.3 C)-99 F (37.2 C)] 97.4 F (36.3 C) (02/04 1150) Pulse Rate:  [68-79] 68 (02/04 1150) Resp:  [17-21] 17 (02/04 1150) BP: (92-113)/(56-70) 92/64 (02/04 1150) SpO2:  [95 %-100 %] 100 % (02/04 1150) Weight:  [50 kg] 50 kg (02/04 0500) Last BM Date: 05/07/21  Weight change: Filed Weights   05/08/21 0200 05/09/21 0008 05/09/21 0500  Weight: 49 kg 50 kg 50 kg    Intake/Output:   Intake/Output Summary (Last 24 hours) at 05/09/2021 1344 Last data filed at 05/09/2021 1305 Gross per 24 hour  Intake 483 ml  Output 1200 ml  Net -717 ml       Physical Exam  General:  Thin.  No resp difficulty HEENT: normal. Left nare packed.  Neck: supple. JVP 8-9 with prominent v waves. Carotids 2+ bilat; no bruits. No lymphadenopathy or thryomegaly appreciated. Cor: PMI nondisplaced. Regular rate & rhythm. Mechanical s1 Lungs: clear Abdomen: soft, nontender, nondistended. No hepatosplenomegaly. No bruits or masses. Good bowel sounds. Extremities: no cyanosis, clubbing, rash, edema Neuro: alert & orientedx3, cranial nerves grossly intact. moves all 4 extremities w/o difficulty. Affect pleasant    Telemetry  Chronic AF/AFL 70-90s with occasional PVCs. Personally reviewed  Labs    CBC Recent Labs    05/08/21 0224 05/09/21 0342  WBC 5.1 5.5  HGB 8.7* 8.8*  HCT 27.2* 26.5*  MCV 99.3 99.3  PLT 89* 95*    Basic Metabolic Panel Recent Labs    05/08/21 0224 05/09/21 0342  NA 132* 131*  K 3.2* 3.9  CL 94* 95*  CO2 26 24  GLUCOSE 111* 103*  BUN 57* 65*   CREATININE 3.04* 3.03*  CALCIUM 9.2 9.2    Liver Function Tests No results for input(s): AST, ALT, ALKPHOS, BILITOT, PROT, ALBUMIN in the last 72 hours.  No results for input(s): LIPASE, AMYLASE in the last 72 hours. Cardiac Enzymes No results for input(s): CKTOTAL, CKMB, CKMBINDEX, TROPONINI in the last 72 hours.  BNP: BNP (last 3 results) Recent Labs    04/24/21 1231 04/30/21 1235  BNP 1,444.9* 898.1*     ProBNP (last 3 results) Recent Labs    07/25/20 1036  PROBNP 8,332*      D-Dimer No results for input(s): DDIMER in the last 72 hours. Hemoglobin A1C No results for input(s): HGBA1C in the last 72 hours. Fasting Lipid Panel No results for input(s): CHOL, HDL, LDLCALC, TRIG, CHOLHDL, LDLDIRECT in the last 72 hours. Thyroid Function Tests No results for input(s): TSH, T4TOTAL, T3FREE, THYROIDAB in the last 72 hours.  Invalid input(s): FREET3  Other results:   Imaging    No results found.   Medications:     Scheduled Medications:  amoxicillin  250 mg Oral Q12H   feeding supplement  237 mL Oral TID BM   furosemide  60 mg Oral Daily   midodrine  5 mg Oral TID WC   multivitamin  1 tablet Oral QHS   polyethylene glycol  17 g Oral Daily   sodium chloride  flush  3 mL Intravenous Q12H   warfarin  5 mg Oral ONCE-1600   Warfarin - Pharmacist Dosing Inpatient   Does not apply q1600    Infusions:  sodium chloride     heparin 400 Units/hr (05/09/21 1127)    PRN Medications: sodium chloride, acetaminophen, ondansetron (ZOFRAN) IV, sodium chloride, sodium chloride flush, traZODone    Patient Profile   71 y.o. 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 Admitted with acute on chronic CHF.  Assessment/Plan   1.  Acute on chronic systolic CHF with low output/valvular HF with mixed moderate to severe PH/cor pulmonale in setting of longstanding MV disease - Echo 7/20   LVEF normal. RV severely dilated and HK. RVSP 55 - RHC 8/20 with significantly elevated biventricular pressures, moderate to severe mixed PAH and low output (CI 1.96) - M-spike negative. PYP 1/21 negative for amyloid.  - Echo (6/22): newly reduced EF 35-40%, RV severely reduced, moderate AR, mod/severe PR. (Dr. Haroldine Laws felt EF 50-55%). - Lexiscan stress MPI, 08/22: Partially reversible basal to mid anterior defect consistent w/ infarct and peri-infarct ischemia EF 54% - RHC on admit with elevated filling pressures and low output. CO 2.03. PA sat 43%. Prominent v waves on PCWP tracing.  - Echo 01/31: EF 50-55%, RV severely reduced, RVSP 59 mmHg, BAE, mean gradient 6 mmHg across mitral valve prosthesis, no MR - On admit was placed on milrinone for RV support. Stopped 2/3 No PICC for Co-ox - Volume status still mildly elevated. Resume torsemide - Hold spiro with AKI on CKD - No Entresto/ digoxin w/ CKD. - Continue midodrine 5 TID for BP support  - Given fraility and previous sternotomy x 3, not candidate for advanced therapies. Pallaitive Care now involved. Suspect he will d/c home with Hospice or at least Palliative Care services. Now DNR/DNI   2. 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   - Echo 06/22: EF 35-40%, RV severely reduced, mechanical mitral valve with trivial MR - Prominent v waves on PCWP on RHC. Mitral valve prosthesis appears okay on TTE  - Due to nose bleed coumadin was held.I restarted - Aware of need for SBE prophylaxis.  - INR 1.6 today. Continue warfarin. D/w PharmD will give heparin at 400u/hr (no titration) until INR at least 1.8   3.  Chronic a flutter  - Rate controlled - Continue AC   4.  Thoracic aneurysm, followed by Dr. Cyndia Bent  - CT chest 6/20 asc ao aorta 4.4 cm.   - 48 mm on echo this admit - Not surgical candidate.   5. AKI on Stage IV CKD - Followed by nephrology. - Baseline SCr~2.4. - Scr 3.24>3.1>2.92> 3.04  - Suspect  cardiorenal - C/w midodrine to support renal perfusion   6. 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 Negative for high-grade dysplasia/carcinoma - Followed routinely by GI.  7. Anemia -Hgb 9.9>8.7>8.0>9.1 >8.7> 8.9 - Follow   8. Pancytopenia -Chronic. Platelets 68>>79K >>89 today   9. OSA - Severe, newly diagnosed.  - Awaiting CPAP titration.  - Dr. Radford Pax following.   10. Epistasis  - persistent nosebleed despite conservative measures - ENT consulted. Had L nare packed on 2/3 - Start amoxicillin while he has nose packing.  - R nare bleeding some. Will use Afrin   11. FTT - Previously working with a nutritionist. - Nearing end-stage RV failure.  - Palliative Care consulted  12. DNR/DNI - Palliative Care Team appreciated.  - see discussion above  13. Deconditioning Consult PT.   14. Severe protein calorie malnutrition  Length of Stay: 4  Glori Bickers, MD  05/09/2021, 1:44 PM  Advanced Heart Failure Team Pager [phone] (M-F; 7a - 5p)  Please contact [facility] for night-coverage after hours (5p -7a ) and weekends on [email or web address]

## 2021-05-10 DIAGNOSIS — Z7189 Other specified counseling: Secondary | ICD-10-CM | POA: Diagnosis not present

## 2021-05-10 DIAGNOSIS — N184 Chronic kidney disease, stage 4 (severe): Secondary | ICD-10-CM | POA: Diagnosis not present

## 2021-05-10 DIAGNOSIS — I50810 Right heart failure, unspecified: Secondary | ICD-10-CM | POA: Diagnosis not present

## 2021-05-10 LAB — URINALYSIS, ROUTINE W REFLEX MICROSCOPIC
Glucose, UA: NEGATIVE mg/dL
Ketones, ur: NEGATIVE mg/dL
Leukocytes,Ua: NEGATIVE
Nitrite: NEGATIVE
Protein, ur: >300 mg/dL — AB
Specific Gravity, Urine: 1.015 (ref 1.005–1.030)
pH: 6.5 (ref 5.0–8.0)

## 2021-05-10 LAB — BASIC METABOLIC PANEL
Anion gap: 12 (ref 5–15)
BUN: 64 mg/dL — ABNORMAL HIGH (ref 8–23)
CO2: 23 mmol/L (ref 22–32)
Calcium: 9 mg/dL (ref 8.9–10.3)
Chloride: 95 mmol/L — ABNORMAL LOW (ref 98–111)
Creatinine, Ser: 3 mg/dL — ABNORMAL HIGH (ref 0.61–1.24)
GFR, Estimated: 22 mL/min — ABNORMAL LOW (ref >60)
Glucose, Bld: 127 mg/dL — ABNORMAL HIGH (ref 70–99)
Potassium: 3.2 mmol/L — ABNORMAL LOW (ref 3.5–5.1)
Sodium: 130 mmol/L — ABNORMAL LOW (ref 135–145)

## 2021-05-10 LAB — PROTIME-INR
INR: 2 — ABNORMAL HIGH (ref 0.8–1.2)
Prothrombin Time: 22.7 seconds — ABNORMAL HIGH (ref 11.4–15.2)

## 2021-05-10 LAB — CBC
HCT: 27.2 % — ABNORMAL LOW (ref 39.0–52.0)
Hemoglobin: 8.7 g/dL — ABNORMAL LOW (ref 13.0–17.0)
MCH: 32.3 pg (ref 26.0–34.0)
MCHC: 32 g/dL (ref 30.0–36.0)
MCV: 101.1 fL — ABNORMAL HIGH (ref 80.0–100.0)
Platelets: 90 10*3/uL — ABNORMAL LOW (ref 150–400)
RBC: 2.69 MIL/uL — ABNORMAL LOW (ref 4.22–5.81)
RDW: 33.2 % — ABNORMAL HIGH (ref 11.5–15.5)
WBC: 5.3 10*3/uL (ref 4.0–10.5)
nRBC: 0 % (ref 0.0–0.2)

## 2021-05-10 LAB — URINALYSIS, MICROSCOPIC (REFLEX)

## 2021-05-10 LAB — MAGNESIUM: Magnesium: 2.2 mg/dL (ref 1.7–2.4)

## 2021-05-10 MED ORDER — POTASSIUM CHLORIDE CRYS ER 20 MEQ PO TBCR
40.0000 meq | EXTENDED_RELEASE_TABLET | Freq: Once | ORAL | Status: AC
  Administered 2021-05-10: 40 meq via ORAL
  Filled 2021-05-10: qty 2

## 2021-05-10 MED ORDER — WARFARIN SODIUM 2.5 MG PO TABS
2.5000 mg | ORAL_TABLET | Freq: Once | ORAL | Status: AC
  Administered 2021-05-10: 2.5 mg via ORAL
  Filled 2021-05-10: qty 1

## 2021-05-10 NOTE — Progress Notes (Signed)
Advanced Heart Failure Rounding Note  PCP-Cardiologist: Sinclair Grooms, MD   Subjective:   2/2 ENT consulted for nose bleed. L nare packed.   Milrinone stopped 2/3. Weight unchanged at 110 pounds. No central access to measure co-ox or CVP  Was on heparin for low INR. INR now 2.0 Heparin stopped.   Still with epistaxis and hematuria. Denies SOB, orthopnea or PND  Scr 3.24 > 3.10>>2.92>>3.04 > 3.03 > 3.00   K 3.2   Objective:   Weight Range: 50.1 kg Body mass index is 14.58 kg/m.   Vital Signs:   Temp:  [97.4 F (36.3 C)-97.7 F (36.5 C)] 97.7 F (36.5 C) (02/05 0815) Pulse Rate:  [50-78] 78 (02/05 0815) Resp:  [17-21] 20 (02/05 0815) BP: (92-105)/(54-68) 98/68 (02/05 0815) SpO2:  [94 %-100 %] 100 % (02/05 0815) Weight:  [50.1 kg] 50.1 kg (02/05 0451) Last BM Date: 05/08/21  Weight change: Filed Weights   05/09/21 0008 05/09/21 0500 05/10/21 0451  Weight: 50 kg 50 kg 50.1 kg    Intake/Output:   Intake/Output Summary (Last 24 hours) at 05/10/2021 0907 Last data filed at 05/10/2021 0825 Gross per 24 hour  Intake 641.78 ml  Output 1100 ml  Net -458.22 ml       Physical Exam   General:  Cachetic No resp difficulty HEENT: normal. Left nare packed. + temporal wasting Neck: supple. JVP 8-9 with prominent v waves Carotids 2+ bilat; no bruits. No lymphadenopathy or thryomegaly appreciated. Cor: Irregular rate & rhythm. Mechanical s1 Lungs: clear Abdomen: soft, nontender, nondistended. No hepatosplenomegaly. No bruits or masses. Good bowel sounds. Extremities: no cyanosis, clubbing, rash, edema Neuro: alert & orientedx3, cranial nerves grossly intact. moves all 4 extremities w/o difficulty. Affect pleasant  Telemetry   Chronic AF/AFL 70s Personally reviewed   Labs    CBC Recent Labs    05/09/21 0342 05/10/21 0326  WBC 5.5 5.3  HGB 8.8* 8.7*  HCT 26.5* 27.2*  MCV 99.3 101.1*  PLT 95* 90*    Basic Metabolic Panel Recent Labs     05/09/21 0342 05/10/21 0326  NA 131* 130*  K 3.9 3.2*  CL 95* 95*  CO2 24 23  GLUCOSE 103* 127*  BUN 65* 64*  CREATININE 3.03* 3.00*  CALCIUM 9.2 9.0  MG  --  2.2    Liver Function Tests No results for input(s): AST, ALT, ALKPHOS, BILITOT, PROT, ALBUMIN in the last 72 hours.  No results for input(s): LIPASE, AMYLASE in the last 72 hours. Cardiac Enzymes No results for input(s): CKTOTAL, CKMB, CKMBINDEX, TROPONINI in the last 72 hours.  BNP: BNP (last 3 results) Recent Labs    04/24/21 1231 04/30/21 1235  BNP 1,444.9* 898.1*     ProBNP (last 3 results) Recent Labs    07/25/20 1036  PROBNP 8,332*      D-Dimer No results for input(s): DDIMER in the last 72 hours. Hemoglobin A1C No results for input(s): HGBA1C in the last 72 hours. Fasting Lipid Panel No results for input(s): CHOL, HDL, LDLCALC, TRIG, CHOLHDL, LDLDIRECT in the last 72 hours. Thyroid Function Tests No results for input(s): TSH, T4TOTAL, T3FREE, THYROIDAB in the last 72 hours.  Invalid input(s): FREET3  Other results:   Imaging    No results found.   Medications:     Scheduled Medications:  amoxicillin  250 mg Oral Q12H   feeding supplement  237 mL Oral TID BM   furosemide  80 mg Oral Daily   midodrine  5 mg Oral TID WC   multivitamin  1 tablet Oral QHS   polyethylene glycol  17 g Oral Daily   sodium chloride flush  3 mL Intravenous Q12H   warfarin  2.5 mg Oral ONCE-1600   Warfarin - Pharmacist Dosing Inpatient   Does not apply q1600    Infusions:  sodium chloride      PRN Medications: sodium chloride, acetaminophen, ondansetron (ZOFRAN) IV, sodium chloride, sodium chloride flush, traZODone    Patient Profile   71 y.o. 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 Admitted with acute on chronic CHF.  Assessment/Plan   1.  Acute on chronic systolic CHF with low output/valvular HF with  mixed moderate to severe PH/cor pulmonale in setting of longstanding MV disease - Echo 7/20  LVEF normal. RV severely dilated and HK. RVSP 55 - RHC 8/20 with significantly elevated biventricular pressures, moderate to severe mixed PAH and low output (CI 1.96) - M-spike negative. PYP 1/21 negative for amyloid.  - Echo (6/22): newly reduced EF 35-40%, RV severely reduced, moderate AR, mod/severe PR. (Dr. Haroldine Laws felt EF 50-55%). - Lexiscan stress MPI, 08/22: Partially reversible basal to mid anterior defect consistent w/ infarct and peri-infarct ischemia EF 54% - RHC on admit with elevated filling pressures and low output. CO 2.03. PA sat 43%. Prominent v waves on PCWP tracing.  - Echo 01/31: EF 50-55%, RV severely reduced, RVSP 59 mmHg, BAE, mean gradient 6 mmHg across mitral valve prosthesis, no MR - On admit was placed on milrinone for RV support. Stopped 2/3 No PICC for Co-ox - Volume status much improved but still mildly elevated. Torsemide resumed yesterday. Need to be careful not to overdiurese with RV failure - Hold spiro with CKD - No Entresto/ digoxin w/ CKD. - Continue midodrine 5 TID for BP support  - Given fraility and previous sternotomy x 3, not candidate for advanced therapies. Pallaitive Care now involved. Suspect he will d/c home with Hospice or at least Palliative Care services. Now DNR/DNI   2. 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   - Echo 06/22: EF 35-40%, RV severely reduced, mechanical mitral valve with trivial MR - Prominent v waves on PCWP on RHC. Mitral valve prosthesis appears okay on TTE  - Due to nose bleed coumadin was held.I restarted - Aware of need for SBE prophylaxis.  - INR 1,2 today. Stop heparin. Continue warfarin. Discussed dosing with PharmD personally.   3.  Chronic a flutter  - Rate controlled - Continue AC   4.  Thoracic aneurysm, followed by Dr. Cyndia Bent  - CT chest 6/20 asc ao aorta 4.4 cm.   - 48 mm on echo this  admit - Not surgical candidate.   5. AKI on Stage IV CKD - Followed by nephrology. - Baseline SCr~2.4. - Scr 3.24>3.1>2.92> 3.04 > 3.00 - Suspect cardiorenal - C/w midodrine to support renal perfusion   6. 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 Negative for high-grade dysplasia/carcinoma - Followed routinely by GI.  7. Anemia -Hgb 9.9>8.7>8.0>9.1 >8.7> 8.9> 8.7 - Follow   8. Pancytopenia -Chronic. Platelets 90k today. stable   9. OSA - Severe, newly diagnosed.  - Awaiting CPAP titration.  - Dr. Radford Pax following.   10. Epistaxis  - persistent nosebleed despite conservative measures - ENT consulted. Had L nare packed on 2/3 - On amoxicillin while he has nose packing.  - R nare  bleeding some. Will use Afrin. I packed it with gauze this am. Silver nitrate at bedside if we need to cauterize   11. FTT - Previously working with a nutritionist. - Nearing end-stage RV failure.  - Palliative Care consulted   12. DNR/DNI - Palliative Care Team appreciated.  - see discussion above  13. Deconditioning Consult PT.   14. Severe protein calorie malnutrition - Nutrition following  15. Hematuria, painless - check UA and urine cytology - check renal u/s in am   Length of Stay: [place], MD  05/10/2021, 9:07 AM  Advanced Heart Failure Team Pager [phone] (M-F; 7a - 5p)  Please contact [facility] for night-coverage after hours (5p -7a ) and weekends on [email or web address]

## 2021-05-10 NOTE — Progress Notes (Addendum)
[place] for warfarin Indication:  mechanical mitral valve, chronic aflutter  Allergies  Allergen Reactions   Penicillins Rash    Did it involve swelling of the face/tongue/throat, SOB, or low BP? No Did it involve sudden or severe rash/hives, skin peeling, or any reaction on the inside of your mouth or nose? No Did you need to seek medical attention at a hospital or doctor's office? No When did it last happen?      30 + years If all above answers are NO, may proceed with cephalosporin use.     Patient Measurements: Height: 6\' 1"  (185.4 cm) Weight: 50.1 kg (110 lb 8 oz) IBW/kg (Calculated) : 79.9  Vital Signs: Temp: 97.5 F (36.4 C) (02/05 0451) Temp Source: Oral (02/05 0451) BP: 105/67 (02/05 0451) Pulse Rate: 50 (02/05 0451)  Labs: Recent Labs    05/08/21 0224 05/09/21 0342 05/09/21 2100 05/10/21 0326  HGB 8.7* 8.8*  --  8.7*  HCT 27.2* 26.5*  --  27.2*  PLT 89* 95*  --  90*  LABPROT 22.5* 18.9*  --  22.7*  INR 2.0* 1.6*  --  2.0*  HEPARINUNFRC  --   --  <0.10*  --   CREATININE 3.04* 3.03*  --  3.00*     Estimated Creatinine Clearance: 16.7 mL/min (A) (by C-G formula based on SCr of 3 mg/dL (H)).   Medical History: Past Medical History:  Diagnosis Date   Allergy    Aneurysm of ascending aorta    Atrial fibrillation (HCC)    BPH (benign prostatic hyperplasia)    Colon polyps    FHx: rheumatic heart disease    H/O diplopia    Hepatitis C    Hypertension    Hypogonadism male    Mitral valve disease    mitral valve repair/replacement x3   OSA (obstructive sleep apnea) 12/15/2020    Assessment: 71 year old male with history of mitral valve replacement x 3, currently with mechanical valve. Patient being admitted after RHC. INR elevated initially at 3.7. No hematoma noted post cath.   INR continues to be subtherapeutic, but has trended up to 2. Patient had nose bleeds after cath. ENT consulted and packing now in  place. He is being placed on amoxicillin as he states he has tolerated this prior to dental procedures in the past while packing in place.. Patient has received saline nasal spray risnse, Afrin and TXA epistaxis. Patient reported steady nose bleeds from the right nostril last night. CBC stable. No other signs of bleeding noted.  Per heart failure team's instructions from 2/4, will stop heparin infusion with the INR now above 1.8. Hg 8.7.  Warfarin dosing prior to admit was 5mg  MWF and 2.5mg  all other days, which was adjusted for cath. Prior visit shows regimen as 2.5mg  on Sun/Thur and 5mg  all other days.   Goal of Therapy:  INR goal 2.5-3.5 Monitor platelets by anticoagulation protocol: Yes   Plan:  Give warfarin 2.5 mg today Stop heparin fixed dose  Follow up on resolution of nose bleeds - ENT following, packing in place  Thank you for allowing pharmacy to participate in this patient's care. Daily INR and CBC  Reatha Harps, PharmD PGY1 Pharmacy Resident 05/10/2021 7:53 AM Check [email or web address] for unit specific pharmacy number

## 2021-05-10 NOTE — Progress Notes (Signed)
CCMD calling to notify nursing that pt had a 4 beat run of vtach. She stated she's aware pt has had up to 6 beats. Pt is asymptomatic sitting in bed. Will continue to monitor.

## 2021-05-11 ENCOUNTER — Inpatient Hospital Stay (HOSPITAL_COMMUNITY): Payer: Medicare HMO

## 2021-05-11 DIAGNOSIS — I5032 Chronic diastolic (congestive) heart failure: Secondary | ICD-10-CM | POA: Diagnosis not present

## 2021-05-11 DIAGNOSIS — Z7189 Other specified counseling: Secondary | ICD-10-CM | POA: Diagnosis not present

## 2021-05-11 DIAGNOSIS — I50810 Right heart failure, unspecified: Secondary | ICD-10-CM | POA: Diagnosis not present

## 2021-05-11 DIAGNOSIS — N184 Chronic kidney disease, stage 4 (severe): Secondary | ICD-10-CM | POA: Diagnosis not present

## 2021-05-11 LAB — CBC
HCT: 26 % — ABNORMAL LOW (ref 39.0–52.0)
Hemoglobin: 8.4 g/dL — ABNORMAL LOW (ref 13.0–17.0)
MCH: 32.6 pg (ref 26.0–34.0)
MCHC: 32.3 g/dL (ref 30.0–36.0)
MCV: 100.8 fL — ABNORMAL HIGH (ref 80.0–100.0)
Platelets: 106 10*3/uL — ABNORMAL LOW (ref 150–400)
RBC: 2.58 MIL/uL — ABNORMAL LOW (ref 4.22–5.81)
RDW: 34.4 % — ABNORMAL HIGH (ref 11.5–15.5)
WBC: 5.6 10*3/uL (ref 4.0–10.5)
nRBC: 0 % (ref 0.0–0.2)

## 2021-05-11 LAB — BASIC METABOLIC PANEL
Anion gap: 12 (ref 5–15)
BUN: 69 mg/dL — ABNORMAL HIGH (ref 8–23)
CO2: 20 mmol/L — ABNORMAL LOW (ref 22–32)
Calcium: 8.8 mg/dL — ABNORMAL LOW (ref 8.9–10.3)
Chloride: 95 mmol/L — ABNORMAL LOW (ref 98–111)
Creatinine, Ser: 3.15 mg/dL — ABNORMAL HIGH (ref 0.61–1.24)
GFR, Estimated: 21 mL/min — ABNORMAL LOW (ref >60)
Glucose, Bld: 98 mg/dL (ref 70–99)
Potassium: 3.9 mmol/L (ref 3.5–5.1)
Sodium: 127 mmol/L — ABNORMAL LOW (ref 135–145)

## 2021-05-11 LAB — PROTIME-INR
INR: 2.1 — ABNORMAL HIGH (ref 0.8–1.2)
Prothrombin Time: 23.2 seconds — ABNORMAL HIGH (ref 11.4–15.2)

## 2021-05-11 MED ORDER — WARFARIN SODIUM 5 MG PO TABS
5.0000 mg | ORAL_TABLET | Freq: Once | ORAL | Status: AC
  Administered 2021-05-11: 5 mg via ORAL
  Filled 2021-05-11: qty 1

## 2021-05-11 MED ORDER — PHENYLEPHRINE HCL 0.5 % NA SOLN
1.0000 [drp] | Freq: Four times a day (QID) | NASAL | Status: AC | PRN
  Administered 2021-05-11: 1 [drp] via NASAL
  Filled 2021-05-11: qty 15

## 2021-05-11 NOTE — Progress Notes (Signed)
Physical Therapy Treatment Patient Details Name: [name] MRN: [id number] DOB: 1950-06-27 Today's Date: 05/11/2021   History of Present Illness 71 y.o. male presents to [facility] hospital on 05/05/2021 for evaluation of pulmonary hypertension. Pt underwent RHC on 05/05/2021. Pt developed nose bleed on 2/2. PMH includes mitral valve replacement x3, hepatitis C, a flutter, AAA, CKDIII, anemia.    PT Comments    The pt was agreeable to session with focus on progressing endurance and maintaining strength during admission. The pt was able to complete ~150 ft hallway ambulation without AD, but had x2 minor LOB to the R requiring minA to steady. The pt was then challenged by 5x sit-stand without need for DME which confirms deficits in LE power and activity tolerance as pt reports significant fatigue after this mobility. Will continue to progress as tolerated to meet pt's mobility goals.   Gait Speed: 0.81m/s (Gait speed <0.79m/s indicates increased risk of falls and dependence in ADLs)  5X Sit-to-Stand: 16.02 sec (> 11.4 sec indicates increased risk of falls for individuals aged 60-69, > 15 sec indicates increased risk of recurrent falls)   Recommendations for follow up therapy are one component of a multi-disciplinary discharge planning process, led by the attending physician.  Recommendations may be updated based on patient status, additional functional criteria and insurance authorization.  Follow Up Recommendations  Home health PT     Assistance Recommended at Discharge Intermittent Supervision/Assistance  Patient can return home with the following Help with stairs or ramp for entrance;Assistance with cooking/housework   Equipment Recommendations  Other (comment) (shower seat)    Recommendations for Other Services       Precautions / Restrictions Precautions Precautions: Fall Precaution Comments: monitor SpO2 Restrictions Weight Bearing Restrictions: No     Mobility  Bed  Mobility Overal bed mobility: Independent                  Transfers Overall transfer level: Independent Equipment used: None                    Ambulation/Gait Ambulation/Gait assistance: Supervision Gait Distance (Feet): 150 Feet Assistive device: None Gait Pattern/deviations: Step-through pattern, Decreased stride length, Drifts right/left Gait velocity: 0.36 m/s Gait velocity interpretation: <1.31 ft/sec, indicative of household ambulator   General Gait Details: pt with mild lateral sway and x2 mild LOB to the R requiring minA to correct. reports he feels shaky after being in bed for the weekend. slowed gait but no UE support      Balance Overall balance assessment: Needs assistance Sitting-balance support: Feet supported Sitting balance-Leahy Scale: Good     Standing balance support: No upper extremity supported, During functional activity Standing balance-Leahy Scale: Fair Standing balance comment: x2 minor LOB to the R with gait. minA to correct             High level balance activites: Head turns, Sudden stops High Level Balance Comments: minA with added challenges            Cognition Arousal/Alertness: Awake/alert Behavior During Therapy: WFL for tasks assessed/performed Overall Cognitive Status: Within Functional Limits for tasks assessed                                 General Comments: slightly decreased insight to endurance deficits, but reports he is open to continue testing it        Exercises Other Exercises Other Exercises: 5x sit-stand  in 16.0 seconds.    General Comments General comments (skin integrity, edema, etc.): VSS on RA      Pertinent Vitals/Pain Pain Assessment Pain Assessment: No/denies pain     PT Goals (current goals can now be found in the care plan section) Acute Rehab PT Goals Patient Stated Goal: to return home PT Goal Formulation: With patient Time For Goal Achievement:  05/22/21 Potential to Achieve Goals: Good Progress towards PT goals: Progressing toward goals    Frequency    Min 3X/week      PT Plan Current plan remains appropriate       AM-PAC PT "6 Clicks" Mobility   Outcome Measure  Help needed turning from your back to your side while in a flat bed without using bedrails?: None Help needed moving from lying on your back to sitting on the side of a flat bed without using bedrails?: None Help needed moving to and from a bed to a chair (including a wheelchair)?: None Help needed standing up from a chair using your arms (e.g., wheelchair or bedside chair)?: None Help needed to walk in hospital room?: A Little Help needed climbing 3-5 steps with a railing? : A Little 6 Click Score: 22    End of Session Equipment Utilized During Treatment: Gait belt Activity Tolerance: Patient tolerated treatment well Patient left: in bed;with call bell/phone within reach Nurse Communication: Mobility status PT Visit Diagnosis: Muscle weakness (generalized) (M62.81);Other abnormalities of gait and mobility (R26.89)     Time: 7169-6789 PT Time Calculation (min) (ACUTE ONLY): 18 min  Charges:  $Therapeutic Exercise: 8-22 mins                     West Carbo, PT, DPT   Acute Rehabilitation Department Pager #: [phone]   Sandra Cockayne 05/11/2021, 5:28 PM

## 2021-05-11 NOTE — Progress Notes (Signed)
Daily Progress Note   Patient Name: [name]       Date: 05/11/2021 DOB: 10-21-1950  Age: 71 y.o. MRN#: [id number] Attending Physician: Jolaine Artist, MD Primary Care Physician: Sharion Settler, DO Admit Date: 05/05/2021  Reason for Consultation/Follow-up: Establishing goals of care  Patient Profile/HPI:  71 y.o. male  with past medical history of rheumatic heart disease, CHF, mitral valve replacement x 3, chronic a flutter, chronic kidney disease stage 4, chronic anemia, admitted on 05/05/2021 for R heart cath and IV diuresis for heart failure exacerbation. He is currently on milrinone. He is not a candidate for advanced heart failure therapies. Palliative medicine consulted for end-state HF.     Subjective: Chart reviewed including progress notes, labs. Cr is trending up. Reviewed his current illness and possible trajectories with him and Katharine Look who is at bedside.  [name] is irritated by his nasal packing and feels this is impacting his breathing a great deal.  Katharine Look is working with [facility] regarding help in the home- he lives alone, but will need assistance- they have a son who lives locally and can also help. We again discussed [place] and Katharine Look were surprised by the mention of Hospice, did not realize this had been recommended by attending team.  We discussed differences of services and goals of Hospice vs HH/PT. Also discussed outpatient Palliative. Discussed how these decisions are best made within the context of patient's goals and values.  Jenny Reichmann and Katharine Look feel for now they are not ready for Hospice care- [name] wishes to participate in physical therapy in efforts to regain strength- if he went home, he would want for rehospitalization.  However, they do look to Dr. Haroldine Laws  and his recommendations- they feel that Dr. Haroldine Laws will tell them when the time is right for patient to be on Hospice. They note that if over the next few days [name]'s kidneys do not start to improve, or if his breathing worsens- then their goals may change.  Review of Systems  Respiratory:  Negative for shortness of breath.   Psychiatric/Behavioral:  Negative for depression. The patient is not nervous/anxious.     Physical Exam Vitals and nursing note reviewed.  Constitutional:      Comments: frail  Pulmonary:     Effort: Pulmonary effort is normal.  Neurological:  General: No focal deficit present.     Mental Status: He is alert and oriented to person, place, and time.            Vital Signs: BP 94/63    Pulse 67    Temp (!) 97.2 F (36.2 C) (Oral)    Resp 17    Ht 6\' 1"  (1.854 m)    Wt 49.7 kg    SpO2 99%    BMI 14.45 kg/m  SpO2: SpO2: 99 % O2 Device: O2 Device: Room Air O2 Flow Rate:    Intake/output summary:  Intake/Output Summary (Last 24 hours) at 05/11/2021 1247 Last data filed at 05/11/2021 9485 Gross per 24 hour  Intake 600 ml  Output 750 ml  Net -150 ml    LBM: Last BM Date: 05/10/21 Baseline Weight: Weight: 54 kg Most recent weight: Weight: 49.7 kg       Palliative Assessment/Data: PPS: 40%      Patient Active Problem List   Diagnosis Date Noted   Advanced care planning/counseling discussion    Protein-calorie malnutrition, severe 05/06/2021   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[place]) 05/05/2021   Pruritus 03/19/2021   Epistaxis, recurrent 03/19/2021   OSA (obstructive sleep apnea) 12/15/2020   Rhomboid muscle strain 09/05/2020   Hospital discharge follow-up 06/17/2020   History of hypokalemia 06/17/2020   CKD (chronic kidney disease) stage 4, GFR 15-29 ml/min (HCC) 06/17/2020   Onychomycosis 06/17/2020   H/O mitral valve replacement with mechanical valve    Persistent atrial fibrillation (HCC)    Duodenal adenoma    Anticoagulated    Acute blood  loss anemia 11/26/2019   Gastric polyp    Benign neoplasm of colon    Vertigo 06/07/2018   Acute on chronic diastolic heart failure ([place]) 03/04/2018   Cirrhosis ([place]) 05/08/2014   Chronic atrial flutter ([place]) 06/05/2013   Hepatitis C 06/05/2013   Chronic diastolic heart failure ([place]) 06/05/2013   Encounter for therapeutic drug monitoring 04/30/2013   Mechanical mitral valve with surgery x3 01/08/2013   History of colonic polyps 11/14/2012   Thoracic aortic aneurysm 05/02/2012    Palliative Care Assessment & Plan    Assessment/Recommendations/Plan  Continue current care- will monitor for decompensation GOC for now are to d/c home with HH/PT, however, they will look to Dr. Haroldine Laws for recommendation if Hospice is more appropriate, they are in agreement to outpatient Palliative   Code Status: DNR  Prognosis:  Unable to determine  Discharge Planning: To Be Determined  Care plan was discussed with patient and family member.   Thank you for allowing the Palliative Medicine Team to assist in the care of this patient.  Total time:  65 minutes  Mariana Kaufman, AGNP-C Palliative Medicine   Please contact Palliative Medicine Team phone at [phone] for questions and concerns.

## 2021-05-11 NOTE — Progress Notes (Addendum)
Advanced Heart Failure Rounding Note  PCP-Cardiologist: Sinclair Grooms, MD   Subjective:   2/2 ENT consulted for nose bleed. L nare packed.   Milrinone stopped 2/3.  No central access to measure co-ox or CVP  On PO Lasix. Wt down 1 lb.  Scr 3.24 > 3.10>>2.92>>3.04 > 3.03 > 3.00 >3.15   K 3.9  Na 131>>130>>127.   No current dyspnea. No confusion. Still with epistaxis and hematuria. Complaining about nose bleed.   INR 2.1 Hgb 8.8>>8.7>>8.4 Plt 90>>106    Objective:   Weight Range: 49.7 kg Body mass index is 14.45 kg/m.   Vital Signs:   Temp:  [97.5 F (36.4 C)-97.7 F (36.5 C)] 97.5 F (36.4 C) (02/06 0720) Pulse Rate:  [73-85] 73 (02/06 0720) Resp:  [18-24] 20 (02/06 0720) BP: (90-106)/(61-88) 90/61 (02/06 0720) SpO2:  [98 %-100 %] 98 % (02/06 0720) Weight:  [49.7 kg] 49.7 kg (02/06 0621) Last BM Date: 05/10/21  Weight change: Filed Weights   05/09/21 0500 05/10/21 0451 05/11/21 0621  Weight: 50 kg 50.1 kg 49.7 kg    Intake/Output:   Intake/Output Summary (Last 24 hours) at 05/11/2021 0830 Last data filed at 05/11/2021 0027 Gross per 24 hour  Intake 240 ml  Output 850 ml  Net -610 ml      Physical Exam   General:  chronically ill appearing/cachetic. No respiratory difficulty + active nose bleed rt nare  HEENT: normal Neck: supple. JVD 9 cm. Carotids 2+ bilat; no bruits. No lymphadenopathy or thyromegaly appreciated. Cor: PMI nondisplaced. Irregularly irregular rhythm. + mechanical valve sounds  Lungs: clear Abdomen: soft, nontender, nondistended. No hepatosplenomegaly. No bruits or masses. Good bowel sounds. Extremities: thin extremities, no cyanosis, clubbing, rash, edema Neuro: alert & oriented x 3, cranial nerves grossly intact. moves all 4 extremities w/o difficulty. Affect pleasant.   Telemetry   Chronic AF/AFL 70s Personally reviewed   Labs    CBC Recent Labs    05/10/21 0326 05/11/21 0211  WBC 5.3 5.6  HGB 8.7* 8.4*  HCT  27.2* 26.0*  MCV 101.1* 100.8*  PLT 90* 992*   Basic Metabolic Panel Recent Labs    05/10/21 0326 05/11/21 0211  NA 130* 127*  K 3.2* 3.9  CL 95* 95*  CO2 23 20*  GLUCOSE 127* 98  BUN 64* 69*  CREATININE 3.00* 3.15*  CALCIUM 9.0 8.8*  MG 2.2  --    Liver Function Tests No results for input(s): AST, ALT, ALKPHOS, BILITOT, PROT, ALBUMIN in the last 72 hours.  No results for input(s): LIPASE, AMYLASE in the last 72 hours. Cardiac Enzymes No results for input(s): CKTOTAL, CKMB, CKMBINDEX, TROPONINI in the last 72 hours.  BNP: BNP (last 3 results) Recent Labs    04/24/21 1231 04/30/21 1235  BNP 1,444.9* 898.1*    ProBNP (last 3 results) Recent Labs    07/25/20 1036  PROBNP 8,332*     D-Dimer No results for input(s): DDIMER in the last 72 hours. Hemoglobin A1C No results for input(s): HGBA1C in the last 72 hours. Fasting Lipid Panel No results for input(s): CHOL, HDL, LDLCALC, TRIG, CHOLHDL, LDLDIRECT in the last 72 hours. Thyroid Function Tests No results for input(s): TSH, T4TOTAL, T3FREE, THYROIDAB in the last 72 hours.  Invalid input(s): FREET3  Other results:   Imaging    No results found.   Medications:     Scheduled Medications:  amoxicillin  250 mg Oral Q12H   feeding supplement  237 mL Oral  TID BM   furosemide  80 mg Oral Daily   midodrine  5 mg Oral TID WC   multivitamin  1 tablet Oral QHS   polyethylene glycol  17 g Oral Daily   sodium chloride flush  3 mL Intravenous Q12H   Warfarin - Pharmacist Dosing Inpatient   Does not apply q1600    Infusions:  sodium chloride      PRN Medications: sodium chloride, acetaminophen, ondansetron (ZOFRAN) IV, sodium chloride, sodium chloride flush, traZODone    Patient Profile   71 y.o. 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 Admitted with acute on chronic CHF.  Assessment/Plan   1.  Acute on chronic  systolic CHF with low output/valvular HF with mixed moderate to severe PH/cor pulmonale in setting of longstanding MV disease - Echo 7/20  LVEF normal. RV severely dilated and HK. RVSP 55 - RHC 8/20 with significantly elevated biventricular pressures, moderate to severe mixed PAH and low output (CI 1.96) - M-spike negative. PYP 1/21 negative for amyloid.  - Echo (6/22): newly reduced EF 35-40%, RV severely reduced, moderate AR, mod/severe PR. (Dr. Haroldine Laws felt EF 50-55%). - Lexiscan stress MPI, 08/22: Partially reversible basal to mid anterior defect consistent w/ infarct and peri-infarct ischemia EF 54% - RHC on admit with elevated filling pressures and low output. CO 2.03. PA sat 43%. Prominent v waves on PCWP tracing.  - Echo 01/31: EF 50-55%, RV severely reduced, RVSP 59 mmHg, BAE, mean gradient 6 mmHg across mitral valve prosthesis, no MR - On admit was placed on milrinone for RV support. Stopped 2/3 No PICC for Co-ox - Volume status much improved but still mildly elevated. Now on PO Lasix 80 daily. Need to be careful not to overdiurese with RV failure - Hold spiro with CKD - No Entresto/ digoxin w/ CKD. - Continue midodrine 5 TID for BP support  - Given fraility and previous sternotomy x 3, not candidate for advanced therapies. Pallaitive Care now involved. Suspect he will d/c home with Hospice or at least Palliative Care services. Now DNR/DNI   2. 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   - Echo 06/22: EF 35-40%, RV severely reduced, mechanical mitral valve with trivial MR - Prominent v waves on PCWP on RHC. Mitral valve prosthesis appears okay on TTE  - Aware of need for SBE prophylaxis.  - INR 2.1 today. Off heparin gtt. Continue warfarin. Discussed dosing with PharmD personally.   3.  Chronic a flutter  - Rate controlled - Continue AC   4.  Thoracic aneurysm, followed by Dr. Cyndia Bent  - CT chest 6/20 asc ao aorta 4.4 cm.   - 48 mm on echo this admit -  Not surgical candidate.   5. AKI on Stage IV CKD - Followed by nephrology. - Baseline SCr~2.4. - Scr 3.24>3.1>2.92> 3.04 > 3.00 >3.15  - Suspect cardiorenal - C/w midodrine to support renal perfusion   6. 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 Negative for high-grade dysplasia/carcinoma - Followed routinely by GI.  7. Anemia -Hgb 9.9>8.7>8.0>9.1 >8.7> 8.9> 8.7>8.4  - Follow   8. Pancytopenia -Chronic/stable. Platelets 90>>106k today.    9. OSA - Severe, newly diagnosed.  - Awaiting CPAP titration.  - Dr. Radford Pax following.   10. Epistaxis  - persistent nosebleed despite conservative measures - ENT consulted. Had L nare packed on 2/3 - On amoxicillin while he has nose packing.  -  R nare bleeding today.Will use Afrin. Silver nitrate at bedside if we need to cauterize   11. FTT - Previously working with a nutritionist. - Nearing end-stage RV failure.  - Palliative Care consulted   12. DNR/DNI - Palliative Care Team appreciated.  - see discussion above  13. Deconditioning - Ambulate w/ PT - HH PT recommended   14. Severe protein calorie malnutrition - Nutrition following  15. Hematuria, painless -  UA + hematuria. Urine cytology pending  -  check renal u/s   16. Hyponatremia - Na 127. No confusion - monitor closely   - fluid restrict   Length of Stay: Crane, PA-C  05/11/2021, 8:30 AM  Advanced Heart Failure Team Pager [phone] (M-F; 7a - 5p)  Please contact [facility] for night-coverage after hours (5p -7a ) and weekends on [email or web address]   Patient seen and examined with the above-signed Advanced Practice Provider and/or Housestaff. I personally reviewed laboratory data, imaging studies and relevant notes. I independently examined the patient and formulated the important aspects of the plan. I have edited the note to reflect any of my changes or salient points. I have personally discussed the plan with the patient and/or  family.  Feels weak. Still with epistaxis from R nare despite silver nitrate cauterization. Also still having hematuria. On po lasix. SCr trending up.   General:  Sitting up in bed No resp difficulty HEENT: normal + L nare packing. R nare bleeding Neck: supple. JVP 10  prominent v waves Cor: PMI nondisplaced. Irregular rate & rhythm. +mechanical s1 Lungs: clear Abdomen: soft, nontender, nondistended. No hepatosplenomegaly. No bruits or masses. Good bowel sounds. Extremities: no cyanosis, clubbing, rash, edema Neuro: alert & orientedx3, cranial nerves grossly intact. moves all 4 extremities w/o difficulty. Affect pleasant  Remains very tenuous in setting of end-stage RV failure. Now off milrinone. SCr trending up. Also with hematuria and persistent epistaxis. UA without infection.   Will continue po lasix. Follow Scr.   Re-consult ENT. Check renal u/s.  Suspect home with Hospice is the best option for him. D/w him and his family yesterday.   Glori Bickers, MD  9:20 AM

## 2021-05-11 NOTE — Plan of Care (Signed)
°  Problem: Health Behavior/Discharge Planning: Goal: Ability to manage health-related needs will improve Outcome: Progressing   Problem: Clinical Measurements: Goal: Ability to maintain clinical measurements within normal limits will improve Outcome: Progressing   Problem: Clinical Measurements: Goal: Cardiovascular complication will be avoided Outcome: Progressing   Problem: Nutrition: Goal: Adequate nutrition will be maintained Outcome: Progressing   Problem: Safety: Goal: Ability to remain free from injury will improve Outcome: Progressing

## 2021-05-11 NOTE — TOC Initial Note (Signed)
Transition of Care [facility]) - Initial/Assessment Note    Patient Details  Name: TIFFANY TALARICO MRN: [id number] Date of Birth: Jun 29, 1950  [facility]) CM/SW Contact:    Erenest Rasher, RN Phone Number: [phone] 05/11/2021, 2:00 PM  Clinical Narrative:                 HF TOC CM spoke to pt, ex-wife and sons at bedside. Pt agreeable to [facility], offered choice. Agreeable to agency that will accept insurance. [facility]. Unable to accept referral for [facility], but able to accept for Home Hospice. Waiting final decision for [facility] vs Home Hospice. Pt and family wanted to speak to attending before making final decision. Pt declined DME. His ex-wife will pick up a cane and shower chair from [employer] using he [facility] extra benefits account for free.   Expected Discharge Plan: [place] Barriers to Discharge: Continued Medical Work up   Patient Goals and CMS Choice Patient states their goals for this hospitalization and ongoing recovery are:: would like to get stronger CMS Medicare.gov Compare Post Acute Care list provided to:: Patient Choice offered to / list presented to : Patient  Expected Discharge Plan and Services Expected Discharge Plan: [place] In-house Referral: Clinical Social Work Discharge Planning Services: CM Consult Post Acute Care Choice: [place] arrangements for the past 2 months: [place]: [place] Date [place]: 05/11/21 Time HH Agency Contacted: 20 Representative spoke with at [place]: [place] Arrangements/Services Living arrangements for the past 2 months: [place] with:: Self Patient language and need for interpreter reviewed:: Yes Do you feel safe going back to the place where you live?: Yes      Need for Family Participation in Patient Care: Yes (Comment) Care giver support system in place?: Yes (comment)   Criminal Activity/Legal  Involvement Pertinent to Current Situation/Hospitalization: No - Comment as needed  Activities of Daily Living Home Assistive Devices/Equipment: Eyeglasses ADL Screening (condition at time of admission) Patient's cognitive ability adequate to safely complete daily activities?: Yes Is the patient deaf or have difficulty hearing?: No Does the patient have difficulty seeing, even when wearing glasses/contacts?: No Does the patient have difficulty concentrating, remembering, or making decisions?: No Patient able to express need for assistance with ADLs?: Yes Does the patient have difficulty dressing or bathing?: Yes Independently performs ADLs?: No Communication: Independent Dressing (OT): Needs assistance Is this a change from baseline?: Pre-admission baseline Grooming: Needs assistance Is this a change from baseline?: Pre-admission baseline Feeding: Independent Bathing: Needs assistance Is this a change from baseline?: Pre-admission baseline Toileting: Needs assistance Is this a change from baseline?: Pre-admission baseline In/Out Bed: Needs assistance Does the patient have difficulty walking or climbing stairs?: Yes Weakness of Legs: Both Weakness of Arms/Hands: None  Permission Sought/Granted Permission sought to share information with : Case Manager, Family Supports, PCP Permission granted to share information with : Yes, Verbal Permission Granted  Share Information with NAME: Park Beck  Permission granted to share info w AGENCY: Home Health, DME  Permission granted to share info w Relationship: ex-wife  Permission granted to share info w Contact Information: [phone]  Emotional Assessment Appearance:: Appears stated age Attitude/Demeanor/Rapport: Engaged Affect (typically observed): Accepting Orientation: : Oriented to Self, Oriented to Place, Oriented to  Time, Oriented to Situation   Psych Involvement: No (comment)  Admission diagnosis:  RVF (right ventricular  failure) (  [place]) [I50.810] Patient Active Problem List   Diagnosis Date Noted   Advanced care planning/counseling discussion    Protein-calorie malnutrition, severe 05/06/2021   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[place]) 05/05/2021   Pruritus 03/19/2021   Epistaxis, recurrent 03/19/2021   OSA (obstructive sleep apnea) 12/15/2020   Rhomboid muscle strain 09/05/2020   Hospital discharge follow-up 06/17/2020   History of hypokalemia 06/17/2020   CKD (chronic kidney disease) stage 4, GFR 15-29 ml/min ([place]) 06/17/2020   Onychomycosis 06/17/2020   H/O mitral valve replacement with mechanical valve    Persistent atrial fibrillation (HCC)    Duodenal adenoma    Anticoagulated    Acute blood loss anemia 11/26/2019   Gastric polyp    Benign neoplasm of colon    Vertigo 06/07/2018   Acute on chronic diastolic heart failure ([place]) 03/04/2018   Cirrhosis ([place]) 05/08/2014   Chronic atrial flutter ([place]) 06/05/2013   Hepatitis C 06/05/2013   Chronic diastolic heart failure ([place]) 06/05/2013   Encounter for therapeutic drug monitoring 04/30/2013   Mechanical mitral valve with surgery x3 01/08/2013   History of colonic polyps 11/14/2012   Thoracic aortic aneurysm 05/02/2012   PCP:  Sharion Settler, DO Pharmacy:   [place] [place] [address] [place] [address] Phone: [phone] Fax: [place], [address] AT [facility] [address] [address] Phone: [phone] Fax: [phone]     Social Determinants of Health (SDOH) Interventions Food Insecurity Interventions: Intervention Not Indicated Financial Strain Interventions: Intervention Not Indicated Housing Interventions: Intervention Not Indicated Transportation Interventions: Intervention Not Indicated  Readmission Risk Interventions No flowsheet data found.

## 2021-05-11 NOTE — Progress Notes (Signed)
Subjective: The patient complains of increasing right-sided epistaxis since Saturday.  The left nasal packing is still in place.  No significant left-sided bleeding.  Objective: Vital signs in last 24 hours: Temp:  [97.5 F (36.4 C)-97.7 F (36.5 C)] 97.5 F (36.4 C) (02/06 0720) Pulse Rate:  [73-85] 73 (02/06 0720) Resp:  [18-24] 20 (02/06 0720) BP: (90-106)/(61-88) 90/61 (02/06 0720) SpO2:  [98 %-100 %] 98 % (02/06 0720) Weight:  [49.7 kg] 49.7 kg (02/06 4270)  Physical Exam: General appearance: alert and cooperative Head: Normocephalic, without obvious abnormality, atraumatic Eyes: Pupils are equal, round, reactive to light. Extraocular motion is intact.  Ears: Examination of the ears shows normal auricles and external auditory canals bilaterally.  Nose: Nasal examination shows left nasal packing in place.  Bleeding is noted from the right nasal cavity.  The bleeding is controlled with a merocel packing. Face: Facial examination shows no asymmetry. Palpation of the face elicit no significant tenderness.  Mouth: Oral cavity examination shows no mucosal lacerations. No significant trismus is noted.  Neck: Palpation of the neck reveals no lymphadenopathy or mass. The trachea is midline.   Recent Labs    05/10/21 0326 05/11/21 0211  WBC 5.3 5.6  HGB 8.7* 8.4*  HCT 27.2* 26.0*  PLT 90* 106*   Recent Labs    05/10/21 0326 05/11/21 0211  NA 130* 127*  K 3.2* 3.9  CL 95* 95*  CO2 23 20*  GLUCOSE 127* 98  BUN 64* 69*  CREATININE 3.00* 3.15*  CALCIUM 9.0 8.8*    Medications: I have reviewed the patient's current medications. Scheduled:  amoxicillin  250 mg Oral Q12H   feeding supplement  237 mL Oral TID BM   furosemide  80 mg Oral Daily   midodrine  5 mg Oral TID WC   multivitamin  1 tablet Oral QHS   polyethylene glycol  17 g Oral Daily   sodium chloride flush  3 mL Intravenous Q12H   Warfarin - Pharmacist Dosing Inpatient   Does not apply q1600   Continuous:   sodium chloride      Assessment/Plan: The patient has been experiencing right-sided epistaxis for the past 2 days.  No significant left-sided bleeding since the packing was placed 4 days ago. 1.  The right nasal cavity is packed without difficulty. 2.  We will remove the left nasal packing tomorrow. 3.  Continue with amoxicillin while the packing is in place.   LOS: 6 days   Roemello Speyer W Arby Dahir 05/11/2021, 11:46 AM

## 2021-05-11 NOTE — TOC CM/SW Note (Signed)
HF TOC CM spoke to pt's wife HCPOA and [place] is their choice. Pt did meet with [facility] ad Hospice rep, Hoyle Sauer. They are agreeable to [place]. [facility] will arrange all needed DME. Pt requesting cane and shower chair. Made rep aware. Updated attending. [place], Heart Failure TOC CM [phone]

## 2021-05-11 NOTE — Care Management Important Message (Signed)
Important Message  Patient Details  Name: [name] MRN: [id number] Date of Birth: Jun 27, 1950   Medicare Important Message Given:  Yes     Shelda Altes 05/11/2021, 8:44 AM

## 2021-05-11 NOTE — Progress Notes (Signed)
[place] for warfarin Indication:  mechanical mitral valve, chronic aflutter  Allergies  Allergen Reactions   Penicillins Rash    Did it involve swelling of the face/tongue/throat, SOB, or low BP? No Did it involve sudden or severe rash/hives, skin peeling, or any reaction on the inside of your mouth or nose? No Did you need to seek medical attention at a hospital or doctor's office? No When did it last happen?      30 + years If all above answers are NO, may proceed with cephalosporin use.     Patient Measurements: Height: 6\' 1"  (185.4 cm) Weight: 49.7 kg (109 lb 8 oz) IBW/kg (Calculated) : 79.9  Vital Signs: Temp: 97.2 F (36.2 C) (02/06 1200) Temp Source: Oral (02/06 1200) BP: 94/63 (02/06 1200) Pulse Rate: 67 (02/06 1200)  Labs: Recent Labs    05/09/21 0342 05/09/21 2100 05/10/21 0326 05/11/21 0211  HGB 8.8*  --  8.7* 8.4*  HCT 26.5*  --  27.2* 26.0*  PLT 95*  --  90* 106*  LABPROT 18.9*  --  22.7* 23.2*  INR 1.6*  --  2.0* 2.1*  HEPARINUNFRC  --  <0.10*  --   --   CREATININE 3.03*  --  3.00* 3.15*     Estimated Creatinine Clearance: 15.8 mL/min (A) (by C-G formula based on SCr of 3.15 mg/dL (H)).   Medical History: Past Medical History:  Diagnosis Date   Allergy    Aneurysm of ascending aorta    Atrial fibrillation (HCC)    BPH (benign prostatic hyperplasia)    Colon polyps    FHx: rheumatic heart disease    H/O diplopia    Hepatitis C    Hypertension    Hypogonadism male    Mitral valve disease    mitral valve repair/replacement x3   OSA (obstructive sleep apnea) 12/15/2020    Assessment: 71 year old male with history of mitral valve replacement x 3, currently with mechanical valve. Patient being admitted after RHC. INR elevated initially at 3.7. No hematoma noted post cath.   Patient had nose bleeds after cath. ENT consulted and packing now in place. He is being placed on amoxicillin as he states he  has tolerated this prior to dental procedures in the past while packing in place. Patient has received saline nasal spray risnse, Afrin and TXA epistaxis.   INR today is subtherapeutic at 2.1. Hgb 8.4, plt 106 - stable. Still having epistaxis and hematuria - right nasal cavity now packed per ENT. Off heparin.   Warfarin dosing prior to admit was 5mg  MWF and 2.5mg  all other days, which was adjusted for cath. Prior visit shows regimen as 2.5mg  on Sun/Thur and 5mg  all other days.   Goal of Therapy:  INR goal 2.5-3.5 Monitor platelets by anticoagulation protocol: Yes   Plan:  Give warfarin 5 mg today Follow up on resolution of nose bleeds - ENT following, packing in place Daily INR and CBC  Thank you for allowing pharmacy to participate in this patient's care.  Antonietta Jewel, PharmD, [place] Clinical Pharmacist  Phone: [phone] 05/11/2021 1:34 PM  Please check AMION for all [place] phone numbers After 10:00 PM, call [place] [phone]

## 2021-05-12 DIAGNOSIS — I50810 Right heart failure, unspecified: Secondary | ICD-10-CM | POA: Diagnosis not present

## 2021-05-12 LAB — CBC
HCT: 25.6 % — ABNORMAL LOW (ref 39.0–52.0)
Hemoglobin: 8.4 g/dL — ABNORMAL LOW (ref 13.0–17.0)
MCH: 33.1 pg (ref 26.0–34.0)
MCHC: 32.8 g/dL (ref 30.0–36.0)
MCV: 100.8 fL — ABNORMAL HIGH (ref 80.0–100.0)
Platelets: 100 10*3/uL — ABNORMAL LOW (ref 150–400)
RBC: 2.54 MIL/uL — ABNORMAL LOW (ref 4.22–5.81)
RDW: 34 % — ABNORMAL HIGH (ref 11.5–15.5)
WBC: 5.2 10*3/uL (ref 4.0–10.5)
nRBC: 0 % (ref 0.0–0.2)

## 2021-05-12 LAB — BASIC METABOLIC PANEL
Anion gap: 12 (ref 5–15)
BUN: 70 mg/dL — ABNORMAL HIGH (ref 8–23)
CO2: 23 mmol/L (ref 22–32)
Calcium: 9.3 mg/dL (ref 8.9–10.3)
Chloride: 97 mmol/L — ABNORMAL LOW (ref 98–111)
Creatinine, Ser: 2.73 mg/dL — ABNORMAL HIGH (ref 0.61–1.24)
GFR, Estimated: 25 mL/min — ABNORMAL LOW (ref >60)
Glucose, Bld: 90 mg/dL (ref 70–99)
Potassium: 3.4 mmol/L — ABNORMAL LOW (ref 3.5–5.1)
Sodium: 132 mmol/L — ABNORMAL LOW (ref 135–145)

## 2021-05-12 LAB — PROTIME-INR
INR: 1.9 — ABNORMAL HIGH (ref 0.8–1.2)
Prothrombin Time: 21.4 seconds — ABNORMAL HIGH (ref 11.4–15.2)

## 2021-05-12 MED ORDER — WARFARIN SODIUM 3 MG PO TABS
6.0000 mg | ORAL_TABLET | Freq: Once | ORAL | Status: AC
  Administered 2021-05-12: 6 mg via ORAL
  Filled 2021-05-12: qty 2

## 2021-05-12 MED ORDER — POTASSIUM CHLORIDE CRYS ER 20 MEQ PO TBCR
30.0000 meq | EXTENDED_RELEASE_TABLET | Freq: Two times a day (BID) | ORAL | Status: AC
  Administered 2021-05-12 (×2): 30 meq via ORAL
  Filled 2021-05-12 (×2): qty 1

## 2021-05-12 NOTE — Progress Notes (Signed)
[place] for warfarin Indication:  mechanical mitral valve, chronic aflutter  Allergies  Allergen Reactions   Penicillins Rash    **tolerated amoxicillin Feb 2023**  Did it involve swelling of the face/tongue/throat, SOB, or low BP? No Did it involve sudden or severe rash/hives, skin peeling, or any reaction on the inside of your mouth or nose? No Did you need to seek medical attention at a hospital or doctor's office? No When did it last happen?      30 + years If all above answers are "NO", may proceed with cephalosporin use.     Patient Measurements: Height: 6\' 1"  (185.4 cm) Weight: 49 kg (108 lb) IBW/kg (Calculated) : 79.9  Vital Signs: Temp: 97.5 F (36.4 C) (02/07 0700) Temp Source: Oral (02/07 0700) BP: 108/72 (02/07 0700) Pulse Rate: 92 (02/07 0700)  Labs: Recent Labs    05/09/21 2100 05/10/21 0326 05/10/21 0326 05/11/21 0211 05/12/21 0257  HGB  --  8.7*   < > 8.4* 8.4*  HCT  --  27.2*  --  26.0* 25.6*  PLT  --  90*  --  106* 100*  LABPROT  --  22.7*  --  23.2* 21.4*  INR  --  2.0*  --  2.1* 1.9*  HEPARINUNFRC <0.10*  --   --   --   --   CREATININE  --  3.00*  --  3.15* 2.73*   < > = values in this interval not displayed.     Estimated Creatinine Clearance: 17.9 mL/min (A) (by C-G formula based on SCr of 2.73 mg/dL (H)).   Medical History: Past Medical History:  Diagnosis Date   Allergy    Aneurysm of ascending aorta    Atrial fibrillation (HCC)    BPH (benign prostatic hyperplasia)    Colon polyps    FHx: rheumatic heart disease    H/O diplopia    Hepatitis C    Hypertension    Hypogonadism male    Mitral valve disease    mitral valve repair/replacement x3   OSA (obstructive sleep apnea) 12/15/2020    Assessment: 71 year old male with history of mitral valve replacement x 3, currently with mechanical valve. Patient being admitted after RHC. INR elevated initially at 3.7. No hematoma noted post cath.    Patient had nose bleeds after cath. ENT consulted and packing now in place. He is being placed on amoxicillin as he states he has tolerated this prior to dental procedures in the past while packing in place. Patient has received saline nasal spray risnse, Afrin and TXA epistaxis.   INR today is subtherapeutic at 2.1. Hgb 8.4, plt 106 - stable. Still having epistaxis and hematuria - right nasal cavity now packed per ENT. Off heparin.   Warfarin dosing prior to admit was 5mg  MWF and 2.5mg  all other days, which was adjusted for cath. Prior visit shows regimen as 2.5mg  on Sun/Thur and 5mg  all other days.   Goal of Therapy:  INR goal 2.5-3.5 Monitor platelets by anticoagulation protocol: Yes   Plan:  Give warfarin 6 mg today Follow up on resolution of nose bleeds - ENT following, packing removed on left side (no bleeding); in place on R side for 5 days. Daily INR and CBC  Thank you for allowing pharmacy to participate in this patient's care.  [place] Crane, Roylene Reason, BCCP Clinical Pharmacist  05/12/2021 11:17 AM   [facility] pharmacy phone numbers are listed on [email or web address]

## 2021-05-12 NOTE — Plan of Care (Signed)
?  Problem: Clinical Measurements: ?Goal: Respiratory complications will improve ?Outcome: Progressing ?  ?Problem: Activity: ?Goal: Risk for activity intolerance will decrease ?Outcome: Progressing ?  ?Problem: Coping: ?Goal: Level of anxiety will decrease ?Outcome: Progressing ?  ?Problem: Elimination: ?Goal: Will not experience complications related to urinary retention ?Outcome: Progressing ?  ?

## 2021-05-12 NOTE — Progress Notes (Signed)
Advanced Heart Failure Rounding Note  PCP-Cardiologist: Sinclair Grooms, MD   Subjective:   2/2 ENT consulted for nose bleed. L nare packed.   Milrinone stopped 2/3.  No central access to measure co-ox or CVP  On PO Lasix. Wt down another pound (11 pounds total) Scr 3.24 > 3.10>>2.92>>3.04 > 3.03 > 3.00 >3.15> 2.73   K 3.4 Na 131>>130>>127 > 132.   Breathing feels good. No orthopnea or PND.   Still having some epistaxis. Hematuria clearing. Renal u/s - no mass or obstruction    Objective:   Weight Range: 49 kg Body mass index is 14.25 kg/m.   Vital Signs:   Temp:  [97.2 F (36.2 C)-98.3 F (36.8 C)] 97.5 F (36.4 C) (02/07 0700) Pulse Rate:  [67-92] 92 (02/07 0700) Resp:  [14-22] 18 (02/07 0700) BP: (94-116)/(57-72) 108/72 (02/07 0700) SpO2:  [96 %-100 %] 96 % (02/07 0700) Weight:  [49 kg] 49 kg (02/07 0421) Last BM Date: 05/10/21  Weight change: Filed Weights   05/10/21 0451 05/11/21 0621 05/12/21 0421  Weight: 50.1 kg 49.7 kg 49 kg    Intake/Output:   Intake/Output Summary (Last 24 hours) at 05/12/2021 0822 Last data filed at 05/12/2021 1245 Gross per 24 hour  Intake 780 ml  Output 1600 ml  Net -820 ml       Physical Exam   General:  chronically ill appearing/cachetic. No respiratory difficulty  HEENT: normal nose packed bilaterally  Neck: supple. JVP 9-10 + prominent v waves  Carotids 2+ bilat; no bruits. No lymphadenopathy or thryomegaly appreciated. Cor: PMI nondisplaced. Irregular rate & rhythm. Mechanical s1 Lungs: clear Abdomen: soft, nontender, nondistended. No hepatosplenomegaly. No bruits or masses. Good bowel sounds. Extremities: no cyanosis, clubbing, rash, edema Neuro: alert & orientedx3, cranial nerves grossly intact. moves all 4 extremities w/o difficulty. Affect pleasant   Telemetry   Chronic AF/AFL 70s Personally reviewed   Labs    CBC Recent Labs    05/11/21 0211 05/12/21 0257  WBC 5.6 5.2  HGB 8.4* 8.4*  HCT  26.0* 25.6*  MCV 100.8* 100.8*  PLT 106* 100*    Basic Metabolic Panel Recent Labs    05/10/21 0326 05/11/21 0211 05/12/21 0257  NA 130* 127* 132*  K 3.2* 3.9 3.4*  CL 95* 95* 97*  CO2 23 20* 23  GLUCOSE 127* 98 90  BUN 64* 69* 70*  CREATININE 3.00* 3.15* 2.73*  CALCIUM 9.0 8.8* 9.3  MG 2.2  --   --     Liver Function Tests No results for input(s): AST, ALT, ALKPHOS, BILITOT, PROT, ALBUMIN in the last 72 hours.  No results for input(s): LIPASE, AMYLASE in the last 72 hours. Cardiac Enzymes No results for input(s): CKTOTAL, CKMB, CKMBINDEX, TROPONINI in the last 72 hours.  BNP: BNP (last 3 results) Recent Labs    04/24/21 1231 04/30/21 1235  BNP 1,444.9* 898.1*     ProBNP (last 3 results) Recent Labs    07/25/20 1036  PROBNP 8,332*      D-Dimer No results for input(s): DDIMER in the last 72 hours. Hemoglobin A1C No results for input(s): HGBA1C in the last 72 hours. Fasting Lipid Panel No results for input(s): CHOL, HDL, LDLCALC, TRIG, CHOLHDL, LDLDIRECT in the last 72 hours. Thyroid Function Tests No results for input(s): TSH, T4TOTAL, T3FREE, THYROIDAB in the last 72 hours.  Invalid input(s): FREET3  Other results:   Imaging    US RENAL  Result Date: 05/11/2021 CLINICAL DATA:  Hematuria. EXAM: RENAL / URINARY TRACT ULTRASOUND COMPLETE COMPARISON:  None. FINDINGS: Right Kidney: Renal measurements: 10.0 x 8.1 x 4.2 cm = volume: 111 mL. Increased echogenicity of renal parenchyma is noted suggesting medical renal disease. Several hyperechoic foci are noted without shadowing. No mass or hydronephrosis visualized. Left Kidney: Renal measurements: 9.7 x 5.2 x 3.9 cm = volume: 104 mL. Two simple cysts are noted, the largest measuring 8 mm. Increased echogenicity of renal parenchyma is noted. No mass or hydronephrosis visualized. Bladder: Appears normal for degree of bladder distention. Other: Probable prostatic calcification is noted. IMPRESSION: Increased  echogenicity of renal parenchyma is noted bilaterally suggesting medical renal disease. Possible nonobstructive right nephrolithiasis. Electronically Signed   By: Marijo Conception M.D.   On: 05/11/2021 14:36     Medications:     Scheduled Medications:  amoxicillin  250 mg Oral Q12H   feeding supplement  237 mL Oral TID BM   furosemide  80 mg Oral Daily   midodrine  5 mg Oral TID WC   multivitamin  1 tablet Oral QHS   polyethylene glycol  17 g Oral Daily   potassium chloride  30 mEq Oral BID   sodium chloride flush  3 mL Intravenous Q12H   Warfarin - Pharmacist Dosing Inpatient   Does not apply q1600    Infusions:  sodium chloride      PRN Medications: sodium chloride, acetaminophen, ondansetron (ZOFRAN) IV, phenylephrine, sodium chloride, sodium chloride flush, traZODone    Patient Profile   71 y.o. 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 Admitted with acute on chronic CHF.  Assessment/Plan   1.  Acute on chronic systolic CHF with low output/valvular HF with mixed moderate to severe PH/cor pulmonale in setting of longstanding MV disease - Echo 7/20  LVEF normal. RV severely dilated and HK. RVSP 55 - RHC 8/20 with significantly elevated biventricular pressures, moderate to severe mixed PAH and low output (CI 1.96) - M-spike negative. PYP 1/21 negative for amyloid.  - Echo (6/22): newly reduced EF 35-40%, RV severely reduced, moderate AR, mod/severe PR. (Dr. Haroldine Laws felt EF 50-55%). - Lexiscan stress MPI, 08/22: Partially reversible basal to mid anterior defect consistent w/ infarct and peri-infarct ischemia EF 54% - RHC on admit with elevated filling pressures and low output. CO 2.03. PA sat 43%. Prominent v waves on PCWP tracing.  - Echo 01/31: EF 50-55%, RV severely reduced, RVSP 59 mmHg, BAE, mean gradient 6 mmHg across mitral valve prosthesis, no MR - On admit was placed on milrinone for  RV support. Stopped 2/3 No PICC for Co-ox - Volume status much improved but still mildly elevated. Now on PO Lasix 80 daily. Need to be careful not to overdiurese with RV failure. Wil lcotninue current dose - Hold spiro with CKD - No Entresto/ digoxin w/ CKD. - Continue midodrine 5 TID for BP support  - Given fraility and previous sternotomy x 3, not candidate for advanced therapies. Pallaitive Care now involved. Suspect he will d/c home with Hospice or at least Palliative Care services. Now DNR/DNI   2. 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   - Echo 06/22: EF 35-40%, RV severely reduced, mechanical mitral valve with trivial MR - Prominent v waves on PCWP on RHC. Mitral valve prosthesis appears okay on TTE  - Aware of need for SBE prophylaxis.  - INR 1.9 today. Discussed dosing with PharmD personally.  3.  Chronic a flutter  - Rate controlled - Continue AC   4.  Thoracic aneurysm, followed by Dr. Cyndia Bent  - CT chest 6/20 asc ao aorta 4.4 cm.   - 48 mm on echo this admit - Not surgical candidate.   5. AKI on Stage IV CKD - Followed by nephrology. - Baseline SCr~2.4. - Scr 3.24>3.1>2.92> 3.04 > 3.00 >3.15 > 2.73 - Suspect cardiorenal - C/w midodrine to support renal perfusion   6. 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 Negative for high-grade dysplasia/carcinoma - Followed routinely by GI.  7. Anemia -Hgb 9.9>8.7>8.0>9.1 >8.7> 8.9> 8.7>8.4 > 8.4 - Follow   8. Pancytopenia -Chronic/stable. Platelets 100k today   9. OSA - Severe, newly diagnosed.  - Awaiting CPAP titration.  - Dr. Radford Pax following.   10. Epistaxis  - persistent nosebleed despite conservative measures - ENT consulted. Had L nare packed on 2/3 R nare pack 2/6 - On amoxicillin while he has nose packing.  - Still bleeding through packing. ENT to see again today  11. FTT - Previously working with a nutritionist. - Nearing end-stage RV failure.  - Palliative Care  consulted   12. DNR/DNI - Palliative Care Team appreciated.  - see discussion above  13. Deconditioning - Ambulate w/ PT - HH PT recommended   14. Severe protein calorie malnutrition - Nutrition following  15. Hematuria, painless -  UA + hematuria. Urine cytology pending  -  Renal u/s - no mass or obstruction  - Outpatient f/u with urology  16. Hyponatremia - Na 127 -> 132. No confusion - monitor closely   - fluid restrict   Stable for d/c home from cardiac standpoint with Palliative Services. Need to get epistaixs under control prior to d/c however. Possible d/c tomorrow.   Length of Stay: [place], MD  05/12/2021, 8:22 AM  Advanced Heart Failure Team Pager [phone] (M-F; 7a - 5p)  Please contact [facility] for night-coverage after hours (5p -7a ) and weekends on [email or web address]

## 2021-05-12 NOTE — Progress Notes (Addendum)
Mobility Specialist Progress Note:   05/12/21 1430  Mobility  Activity Ambulated independently in room  Level of Assistance Standby assist, set-up cues, supervision of patient - no hands on  Assistive Device None  Distance Ambulated (ft) 150 ft  Activity Response Tolerated well  $Mobility charge 1 Mobility   Pt with concerns involving d/t plan. Required encouragement for OOB mobility today. Pt ambulated independently with supervision, no physical assist required. Pt back in bed with all needs met.   Nelta Numbers Mobility Specialist  Phone [phone]

## 2021-05-12 NOTE — Progress Notes (Signed)
Subjective: No significant bleeding overnight. Slight serosanguineous drainage from the right nasal cavity.  Objective: Vital signs in last 24 hours: Temp:  [97.2 F (36.2 C)-98.3 F (36.8 C)] 97.5 F (36.4 C) (02/07 0700) Pulse Rate:  [67-92] 92 (02/07 0700) Resp:  [14-22] 18 (02/07 0700) BP: (94-116)/(57-72) 108/72 (02/07 0700) SpO2:  [96 %-100 %] 96 % (02/07 0700) Weight:  [49 kg] 49 kg (02/07 0421)  Physical Exam: General appearance: alert and cooperative Head: Normocephalic, without obvious abnormality, atraumatic Eyes: Pupils are equal, round, reactive to light. Extraocular motion is intact.  Ears: Examination of the ears shows normal auricles and external auditory canals bilaterally.  Nose: Nasal examination shows bilateral nasal packing in place.  The left packing is removed without difficulty.  No bleeding is noted. Face: Facial examination shows no asymmetry. Palpation of the face elicit no significant tenderness.  Mouth: Oral cavity examination shows no mucosal lacerations. No significant trismus is noted.  Neck: Palpation of the neck reveals no lymphadenopathy or mass. The trachea is midline.   Recent Labs    05/11/21 0211 05/12/21 0257  WBC 5.6 5.2  HGB 8.4* 8.4*  HCT 26.0* 25.6*  PLT 106* 100*   Recent Labs    05/11/21 0211 05/12/21 0257  NA 127* 132*  K 3.9 3.4*  CL 95* 97*  CO2 20* 23  GLUCOSE 98 90  BUN 69* 70*  CREATININE 3.15* 2.73*  CALCIUM 8.8* 9.3    Medications: I have reviewed the patient's current medications. Scheduled:  amoxicillin  250 mg Oral Q12H   feeding supplement  237 mL Oral TID BM   furosemide  80 mg Oral Daily   midodrine  5 mg Oral TID WC   multivitamin  1 tablet Oral QHS   polyethylene glycol  17 g Oral Daily   potassium chloride  30 mEq Oral BID   sodium chloride flush  3 mL Intravenous Q12H   Warfarin - Pharmacist Dosing Inpatient   Does not apply q1600   Continuous:  sodium chloride       Assessment/Plan: Bilateral recurrent epistaxis. -The left nasal packing is removed today without difficulty. -Will leave the right nasal packing in place for approximately 5 days.   LOS: 7 days   [name] W Annalena [name] 05/12/2021, 9:06 AM

## 2021-05-13 DIAGNOSIS — Z7189 Other specified counseling: Secondary | ICD-10-CM | POA: Diagnosis not present

## 2021-05-13 DIAGNOSIS — N184 Chronic kidney disease, stage 4 (severe): Secondary | ICD-10-CM | POA: Diagnosis not present

## 2021-05-13 DIAGNOSIS — I50810 Right heart failure, unspecified: Secondary | ICD-10-CM | POA: Diagnosis not present

## 2021-05-13 LAB — CBC
HCT: 26 % — ABNORMAL LOW (ref 39.0–52.0)
Hemoglobin: 8.2 g/dL — ABNORMAL LOW (ref 13.0–17.0)
MCH: 32.4 pg (ref 26.0–34.0)
MCHC: 31.5 g/dL (ref 30.0–36.0)
MCV: 102.8 fL — ABNORMAL HIGH (ref 80.0–100.0)
Platelets: 107 10*3/uL — ABNORMAL LOW (ref 150–400)
RBC: 2.53 MIL/uL — ABNORMAL LOW (ref 4.22–5.81)
RDW: 33.2 % — ABNORMAL HIGH (ref 11.5–15.5)
WBC: 4.9 10*3/uL (ref 4.0–10.5)
nRBC: 0 % (ref 0.0–0.2)

## 2021-05-13 LAB — BASIC METABOLIC PANEL
Anion gap: 12 (ref 5–15)
BUN: 74 mg/dL — ABNORMAL HIGH (ref 8–23)
CO2: 24 mmol/L (ref 22–32)
Calcium: 9 mg/dL (ref 8.9–10.3)
Chloride: 95 mmol/L — ABNORMAL LOW (ref 98–111)
Creatinine, Ser: 2.96 mg/dL — ABNORMAL HIGH (ref 0.61–1.24)
GFR, Estimated: 22 mL/min — ABNORMAL LOW (ref >60)
Glucose, Bld: 104 mg/dL — ABNORMAL HIGH (ref 70–99)
Potassium: 4.3 mmol/L (ref 3.5–5.1)
Sodium: 131 mmol/L — ABNORMAL LOW (ref 135–145)

## 2021-05-13 LAB — CYTOLOGY - NON PAP

## 2021-05-13 LAB — PROTIME-INR
INR: 2.6 — ABNORMAL HIGH (ref 0.8–1.2)
Prothrombin Time: 27.6 seconds — ABNORMAL HIGH (ref 11.4–15.2)

## 2021-05-13 MED ORDER — METOLAZONE 2.5 MG PO TABS
2.5000 mg | ORAL_TABLET | Freq: Once | ORAL | Status: AC
  Administered 2021-05-13: 2.5 mg via ORAL
  Filled 2021-05-13: qty 1

## 2021-05-13 MED ORDER — WARFARIN SODIUM 2 MG PO TABS
4.0000 mg | ORAL_TABLET | Freq: Once | ORAL | Status: AC
  Administered 2021-05-13: 4 mg via ORAL
  Filled 2021-05-13: qty 2

## 2021-05-13 MED ORDER — ENSURE ENLIVE PO LIQD
237.0000 mL | Freq: Two times a day (BID) | ORAL | Status: DC
  Administered 2021-05-14 – 2021-05-24 (×14): 237 mL via ORAL

## 2021-05-13 MED ORDER — POTASSIUM CHLORIDE CRYS ER 20 MEQ PO TBCR
20.0000 meq | EXTENDED_RELEASE_TABLET | Freq: Once | ORAL | Status: AC
  Administered 2021-05-13: 20 meq via ORAL
  Filled 2021-05-13: qty 1

## 2021-05-13 MED ORDER — TRAMADOL HCL 50 MG PO TABS
50.0000 mg | ORAL_TABLET | Freq: Two times a day (BID) | ORAL | Status: DC | PRN
  Administered 2021-05-13 – 2021-05-24 (×13): 50 mg via ORAL
  Filled 2021-05-13 (×15): qty 1

## 2021-05-13 NOTE — Progress Notes (Signed)
[place] for warfarin Indication:  mechanical mitral valve, chronic aflutter  Allergies  Allergen Reactions   Penicillins Rash    **tolerated amoxicillin Feb 2023**  Did it involve swelling of the face/tongue/throat, SOB, or low BP? No Did it involve sudden or severe rash/hives, skin peeling, or any reaction on the inside of your mouth or nose? No Did you need to seek medical attention at a hospital or doctor's office? No When did it last happen?      30 + years If all above answers are "NO", may proceed with cephalosporin use.     Patient Measurements: Height: 6\' 1"  (185.4 cm) Weight: 49.9 kg (109 lb 14.4 oz) IBW/kg (Calculated) : 79.9  Vital Signs: Temp: 97.8 F (36.6 C) (02/08 0742) Temp Source: Oral (02/08 0742) BP: 111/71 (02/08 0742) Pulse Rate: 66 (02/08 0742)  Labs: Recent Labs    05/11/21 0211 05/12/21 0257 05/13/21 0300  HGB 8.4* 8.4* 8.2*  HCT 26.0* 25.6* 26.0*  PLT 106* 100* 107*  LABPROT 23.2* 21.4* 27.6*  INR 2.1* 1.9* 2.6*  CREATININE 3.15* 2.73* 2.96*     Estimated Creatinine Clearance: 16.9 mL/min (A) (by C-G formula based on SCr of 2.96 mg/dL (H)).   Medical History: Past Medical History:  Diagnosis Date   Allergy    Aneurysm of ascending aorta    Atrial fibrillation (HCC)    BPH (benign prostatic hyperplasia)    Colon polyps    FHx: rheumatic heart disease    H/O diplopia    Hepatitis C    Hypertension    Hypogonadism male    Mitral valve disease    mitral valve repair/replacement x3   OSA (obstructive sleep apnea) 12/15/2020    Assessment: 71 year old male with history of mitral valve replacement x 3, currently with mechanical valve. Patient being admitted after RHC. INR elevated initially at 3.7. No hematoma noted post cath.   Patient had nose bleeds after cath. ENT consulted and packing now in place. He is receiving amoxicillin as he has tolerated this prior to dental procedures in the past  while packing in place.   INR trending up and within goal today at 2.6. Hgb 8.2, plt 107 - stable. Left packing removed 2/7, right nasal packing in place. No active bleeding per ENT, still with some drainage. Plan to leave packing in till Friday.   Warfarin dosing prior to admit was 5mg  MWF and 2.5mg  all other days, which was adjusted for cath. Prior visit shows regimen as 2.5mg  on Sun/Thur and 5mg  all other days.   Goal of Therapy:  INR goal 2.5-3.5 Monitor platelets by anticoagulation protocol: Yes   Plan:  Give warfarin 4 mg today to avoid overshooting based on INR trend Follow up on resolution of nose bleeds - ENT following, packing removed on left side (no bleeding); in place on R side for 5 days. Daily INR and CBC  Thank you for allowing pharmacy to participate in this patient's care.  Erin Hearing PharmD., BCPS Clinical Pharmacist 05/13/2021 10:11 AM

## 2021-05-13 NOTE — Progress Notes (Addendum)
Advanced Heart Failure Rounding Note  PCP-Cardiologist: Sinclair Grooms, MD   Subjective:   2/2 ENT consulted for nose bleed. L nare packed.  2/6 R nare packed.  2/7 L nare packing removed.   Milrinone stopped 2/3.    Scr 3.24 > 3.10>>2.92>>3.04 > 3.03 > 3.00 >3.15> 2.73>2.9    Fatigued after walking. Having some shortness of breath.  Able to walk in the hall.   Objective:   Weight Range: 49.9 kg Body mass index is 14.5 kg/m.   Vital Signs:   Temp:  [97.4 F (36.3 C)-98.1 F (36.7 C)] 97.8 F (36.6 C) (02/08 0742) Pulse Rate:  [66-87] 66 (02/08 0742) Resp:  [15-22] 20 (02/08 0742) BP: (96-111)/(52-71) 111/71 (02/08 0742) SpO2:  [94 %-96 %] 96 % (02/08 0742) Weight:  [49.9 kg] 49.9 kg (02/08 0612) Last BM Date: 05/12/21  Weight change: Filed Weights   05/11/21 0621 05/12/21 0421 05/13/21 0612  Weight: 49.7 kg 49 kg 49.9 kg    Intake/Output:   Intake/Output Summary (Last 24 hours) at 05/13/2021 0955 Last data filed at 05/13/2021 0843 Gross per 24 hour  Intake 1080 ml  Output 750 ml  Net 330 ml      Physical Exam  General:  Thin No resp difficulty HEENT: R Neck: supple. JVP 9-10   Carotids 2+ bilat; no bruits. No lymphadenopathy or thryomegaly appreciated. Cor: PMI nondisplaced. Irregular rate & rhythm. No rubs, gallops or murmurs. Lungs: clear Abdomen: soft, nontender, nondistended. No hepatosplenomegaly. No bruits or masses. Good bowel sounds. Extremities: no cyanosis, clubbing, rash, edema Neuro: alert & orientedx3, cranial nerves grossly intact. moves all 4 extremities w/o difficulty. Affect pleasant   Telemetry   A fib/A flutter 60-70s    Labs    CBC Recent Labs    05/12/21 0257 05/13/21 0300  WBC 5.2 4.9  HGB 8.4* 8.2*  HCT 25.6* 26.0*  MCV 100.8* 102.8*  PLT 100* 660*   Basic Metabolic Panel Recent Labs    05/12/21 0257 05/13/21 0300  NA 132* 131*  K 3.4* 4.3  CL 97* 95*  CO2 23 24  GLUCOSE 90 104*  BUN 70* 74*   CREATININE 2.73* 2.96*  CALCIUM 9.3 9.0   Liver Function Tests No results for input(s): AST, ALT, ALKPHOS, BILITOT, PROT, ALBUMIN in the last 72 hours.  No results for input(s): LIPASE, AMYLASE in the last 72 hours. Cardiac Enzymes No results for input(s): CKTOTAL, CKMB, CKMBINDEX, TROPONINI in the last 72 hours.  BNP: BNP (last 3 results) Recent Labs    04/24/21 1231 04/30/21 1235  BNP 1,444.9* 898.1*    ProBNP (last 3 results) Recent Labs    07/25/20 1036  PROBNP 8,332*     D-Dimer No results for input(s): DDIMER in the last 72 hours. Hemoglobin A1C No results for input(s): HGBA1C in the last 72 hours. Fasting Lipid Panel No results for input(s): CHOL, HDL, LDLCALC, TRIG, CHOLHDL, LDLDIRECT in the last 72 hours. Thyroid Function Tests No results for input(s): TSH, T4TOTAL, T3FREE, THYROIDAB in the last 72 hours.  Invalid input(s): FREET3  Other results:   Imaging    No results found.   Medications:     Scheduled Medications:  amoxicillin  250 mg Oral Q12H   feeding supplement  237 mL Oral TID BM   furosemide  80 mg Oral Daily   midodrine  5 mg Oral TID WC   multivitamin  1 tablet Oral QHS   polyethylene glycol  17 g Oral  Daily   sodium chloride flush  3 mL Intravenous Q12H   Warfarin - Pharmacist Dosing Inpatient   Does not apply q1600    Infusions:  sodium chloride      PRN Medications: sodium chloride, acetaminophen, ondansetron (ZOFRAN) IV, phenylephrine, sodium chloride, sodium chloride flush, traZODone    Patient Profile   71 y.o. 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 Admitted with acute on chronic CHF.  Assessment/Plan   1.  Acute on chronic systolic CHF with low output/valvular HF with mixed moderate to severe PH/cor pulmonale in setting of longstanding MV disease - Echo 7/20  LVEF normal. RV severely dilated and HK. RVSP 55 - RHC 8/20 with  significantly elevated biventricular pressures, moderate to severe mixed PAH and low output (CI 1.96) - M-spike negative. PYP 1/21 negative for amyloid.  - Echo (6/22): newly reduced EF 35-40%, RV severely reduced, moderate AR, mod/severe PR. (Dr. Haroldine Laws felt EF 50-55%). - Lexiscan stress MPI, 08/22: Partially reversible basal to mid anterior defect consistent w/ infarct and peri-infarct ischemia EF 54% - RHC on admit with elevated filling pressures and low output. CO 2.03. PA sat 43%. Prominent v waves on PCWP tracing.  - Echo 01/31: EF 50-55%, RV severely reduced, RVSP 59 mmHg, BAE, mean gradient 6 mmHg across mitral valve prosthesis, no MR - On admit was placed on milrinone for RV support. Stopped 2/3  - Volume status mildly elevated.  Continue lasix 80 mg daily. Give dose on metolazone today and will start twice weekly.   - Hold spiro with CKD - No Entresto/ digoxin w/ CKD. - Continue midodrine 5 TID for BP support  - Given fraility and previous sternotomy x 3, not candidate for advanced therapies. Pallaitive Care now involved. Suspect he will d/c home with Hospice or at least Palliative Care services. Now DNR/DNI   2. 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   - Echo 06/22: EF 35-40%, RV severely reduced, mechanical mitral valve with trivial MR - Prominent v waves on PCWP on RHC. Mitral valve prosthesis appears okay on TTE  - Aware of need for SBE prophylaxis.  - INR  2.6 today. Discussed dosing with PharmD personally.   3.  Chronic a flutter  - Rate controlled - Continue AC   4.  Thoracic aneurysm, followed by Dr. Cyndia Bent  - CT chest 6/20 asc ao aorta 4.4 cm.   - 48 mm on echo this admit - Not surgical candidate.   5. AKI on Stage IV CKD - Followed by nephrology. - Baseline SCr~2.4. - Scr 3.24>3.1>2.92> 3.04 > 3.00 >3.15 > 2.73>2.96  - Suspect cardiorenal - C/w midodrine to support renal perfusion   6. H/o GIB - H/o Duodenal adenoma s/p polypectomy  2/22. Negative for high-grade dysplasia/carcinoma - Followed routinely by GI.  7. Anemia -Hgb 9.9>8.7>8.0>9.1 >8.7> 8.9> 8.7>8.4 > 8.4>8.2 today  - Follow   8. Pancytopenia -Chronic/stable. Platelets 107k today   9. OSA - Severe, newly diagnosed.  - Awaiting CPAP titration.  - Dr. Radford Pax following.   10. Epistaxis  - persistent nosebleed despite conservative measures - ENT consulted. Had L nare packed on 2/3 R nare pack 2/6.  Left packing removed 2.7. Plan to continue R packing until 2/10 - On amoxicillin while he has nose packing.  - He will follow up with ENT Friday.   11. FTT - Previously working with a nutritionist. - Nearing end-stage RV  failure.  - Palliative Care consulted   12. DNR/DNI - Palliative Care Team appreciated.  - see discussion above  13. Deconditioning - Ambulate w/ PT - HH PT recommended   14. Severe protein calorie malnutrition - Nutrition following  15. Hematuria, painless -  UA + hematuria. Urine cytology pending  -  Renal u/s - no mass or obstruction  - Outpatient f/u with urology  16. Hyponatremia - Na 131 today   Plan to d/c home with [facility]. [facility] setting up DME.   Anticipate d/c tomorrow.    Length of Stay: [place], NP  05/13/2021, 9:55 AM  Advanced Heart Failure Team Pager [phone] (M-F; 7a - 5p)  Please contact [facility] for night-coverage after hours (5p -7a ) and weekends on [email or web address]  Patient seen and examined with the above-signed Advanced Practice Provider and/or Housestaff. I personally reviewed laboratory data, imaging studies and relevant notes. I independently examined the patient and formulated the important aspects of the plan. I have edited the note to reflect any of my changes or salient points. I have personally discussed the plan with the patient and/or family.  Feels weak. + SOB. Still with some epistaxis  General:  Cachetic No resp difficulty HEENT: normal + packing on R Neck:  supple. JVP to jaw with prominent v waves Carotids 2+ bilat; no bruits. No lymphadenopathy or thryomegaly appreciated. Cor: PMI nondisplaced. Irreg mech s1 Lungs: clear Abdomen: soft, nontender, nondistended. No hepatosplenomegaly. No bruits or masses. Good bowel sounds. Extremities: no cyanosis, clubbing, rash, edema Neuro: alert & orientedx3, cranial nerves grossly intact. moves all 4 extremities w/o difficulty. Affect pleasant  He is mildly volume overloaded today. Will give a dose of metolazone Follow renal function. Still with some epistaxis but seems to be slowing. Continue abx with packing. Renal function stable. Marland Kitchen Hopefully we can get him home with hospice support soon.   Glori Bickers, MD  5:36 PM

## 2021-05-13 NOTE — Progress Notes (Signed)
Nutrition Follow-up  DOCUMENTATION CODES:   Severe malnutrition in context of chronic illness, Underweight  INTERVENTION:   Encourage good PO intake Decrease Ensure Enlive po BID, each supplement provides 350 kcal and 20 grams of protein. Renal Multivitamin w/ minerals daily  NUTRITION DIAGNOSIS:   Severe Malnutrition related to chronic illness (CHF) as evidenced by severe muscle depletion, severe fat depletion. - Ongoing  GOAL:   Patient will meet greater than or equal to 90% of their needs - Ongoing  MONITOR:   PO intake, Supplement acceptance, Labs, Weight trends  REASON FOR ASSESSMENT:   Malnutrition Screening Tool    ASSESSMENT:   71 y.o. male presented for a R Heart Cath. PMH includes CHF, mitral valve replacement x3, CKD IV, Hep C, and cirrhosis.   Pt resting in bed, family at bedside.   Pt reports that his appetite has improved, but his intake varies due to food not being good. Pt reports that he has not been drinking all the Ensure's due to being on Warfarin and at home he was told he could only have 1 per day.   Per EMR, pt intake includes: 2/5: Lunch 100% 2/6: Breakfast 80%, Dinner 45% 2/7: Breakfast 65%, Lunch 60%, Sinner 100% 2/8: Breakfast 75%, Lunch 100%  Pt with no other questions or concerns at this time.    Medications reviewed and include: Amoxicillin, Lasix, Rena-Vit, Miralax, Potassium Chloride, Warfarin Labs reviewed:  - Sodium 133 - Potassium 2.9 - BUN 48 - Creatinine 3.10  NUTRITION - FOCUSED PHYSICAL EXAM:  Flowsheet Row Most Recent Value  Orbital Region Severe depletion  Upper Arm Region Severe depletion  Thoracic and Lumbar Region Severe depletion  Buccal Region Severe depletion  Temple Region Severe depletion  Clavicle Bone Region Severe depletion  Clavicle and Acromion Bone Region Severe depletion  Scapular Bone Region Severe depletion  Dorsal Hand Severe depletion  Patellar Region Severe depletion  Anterior Thigh Region  Severe depletion  Posterior Calf Region Severe depletion  Edema (RD Assessment) None  Hair Reviewed  Eyes Reviewed  Mouth Reviewed  Skin Reviewed  Nails Reviewed       Diet Order:   Diet Order             Diet regular Room service appropriate? Yes; Fluid consistency: Thin  Diet effective now                   EDUCATION NEEDS:   No education needs have been identified at this time  Skin:  Skin Assessment: Reviewed RN Assessment  Last BM:  2/8  Height:   Ht Readings from Last 1 Encounters:  05/05/21 6\' 1"  (1.854 m)    Weight:   Wt Readings from Last 1 Encounters:  05/13/21 49.9 kg    Ideal Body Weight:  83.6 kg  BMI:  Body mass index is 14.5 kg/m.  Estimated Nutritional Needs:   Kcal:  1600-1800  Protein:  80-95 grams  Fluid:  >/= 1.6 L    Lisa Milian Louie Casa, RD, LDN Clinical Dietitian See [facility] for contact information.

## 2021-05-13 NOTE — TOC Progression Note (Signed)
Transition of Care [facility]) - Progression Note    Patient Details  Name: MAXIMILIEN HAYASHI MRN: [id number] Date of Birth: 1950-12-28  Transition of [facility] CM/SW Contact  Zenon Mayo, RN Phone Number: 05/13/2021, 10:13 AM  Clinical Narrative:    Patient is for possible dc today, NCM received called form Hoyle Sauer with [facility]. They will follow this patient for a couple of [place] therapies and then start home hospice. This was set up by previous NCM.  TOC will continue to follow.    Expected Discharge Plan: [place] Barriers to Discharge: Continued Medical Work up  Expected Discharge Plan and Services Expected Discharge Plan: [place] In-house Referral: Clinical Social Work Discharge Planning Services: CM Consult Post Acute Care Choice: [place] arrangements for the past 2 months: [place]: [place] Date [place]: 05/11/21 Time HH Agency Contacted: 26 Representative spoke with at [place]: [place] Determinants of Health ([place]) Interventions Food Insecurity Interventions: Intervention Not Indicated Financial Strain Interventions: Intervention Not Indicated Housing Interventions: Intervention Not Indicated Transportation Interventions: Intervention Not Indicated  Readmission Risk Interventions No flowsheet data found.

## 2021-05-13 NOTE — Progress Notes (Addendum)
Subjective: Pt reports no significant bleeding overnight.  He has some serosanguineous drainage through the right nasal packing.  Objective: Vital signs in last 24 hours: Temp:  [97.4 F (36.3 C)-98.1 F (36.7 C)] 97.5 F (36.4 C) (02/08 0612) Pulse Rate:  [74-87] 77 (02/08 0612) Resp:  [15-22] 22 (02/08 0612) BP: (96-110)/(52-66) 96/52 (02/08 0612) SpO2:  [94 %-95 %] 94 % (02/08 0612) Weight:  [49.9 kg] 49.9 kg (02/08 0612)  Physical Exam: General appearance: alert and cooperative Head: Normocephalic, without obvious abnormality, atraumatic Eyes: Pupils are equal, round, reactive to light. Extraocular motion is intact.  Ears: Examination of the ears shows normal auricles and external auditory canals bilaterally.  Nose: Nasal examination shows right nasal packing in place.  No active bleeding. Face: Facial examination shows no asymmetry. Palpation of the face elicit no significant tenderness.  Mouth: Oral cavity examination shows no mucosal lacerations. No significant trismus is noted.  Neck: Palpation of the neck reveals no lymphadenopathy or mass. The trachea is midline.   Recent Labs    05/12/21 0257 05/13/21 0300  WBC 5.2 4.9  HGB 8.4* 8.2*  HCT 25.6* 26.0*  PLT 100* 107*   Recent Labs    05/12/21 0257 05/13/21 0300  NA 132* 131*  K 3.4* 4.3  CL 97* 95*  CO2 23 24  GLUCOSE 90 104*  BUN 70* 74*  CREATININE 2.73* 2.96*  CALCIUM 9.3 9.0    Medications: I have reviewed the patient's current medications. Scheduled:  amoxicillin  250 mg Oral Q12H   feeding supplement  237 mL Oral TID BM   furosemide  80 mg Oral Daily   midodrine  5 mg Oral TID WC   multivitamin  1 tablet Oral QHS   polyethylene glycol  17 g Oral Daily   sodium chloride flush  3 mL Intravenous Q12H   Warfarin - Pharmacist Dosing Inpatient   Does not apply q1600   Continuous:  sodium chloride      Assessment/Plan: Recurrent epistaxis -The left nasal packing was removed yesterday.  No  recurrent bleeding. -Will leave the right nasal packing in place until Friday.  If the patient is discharged home, he may follow-up in my office as an outpatient. -Nasal drip pad as needed. -Continue antibiotic while the packing is in place.   LOS: 8 days   Lynise Porr W Blimy Napoleon 05/13/2021, 7:06 AM

## 2021-05-13 NOTE — Progress Notes (Signed)
PT Cancellation Note  Patient Details Name: [name] MRN: [id number] DOB: 1950/05/27   Cancelled Treatment:    Reason Eval/Treat Not Completed: Fatigue/lethargy limiting ability to participate at this time. Pt reports he just received pain medicine and is too drowsy to attempt mobility at this time. Will continue to follow and progress mobility as able.   West Carbo, PT, DPT   Acute Rehabilitation Department Pager #: [phone]   Sandra Cockayne 05/13/2021, 1:34 PM

## 2021-05-13 NOTE — Plan of Care (Signed)
°  Problem: Education: Goal: Knowledge of General Education information will improve Description: Including pain rating scale, medication(s)/side effects and non-pharmacologic comfort measures Outcome: Progressing   Problem: Clinical Measurements: Goal: Respiratory complications will improve Outcome: Progressing   Problem: Coping: Goal: Level of anxiety will decrease Outcome: Progressing   Problem: Safety: Goal: Ability to remain free from injury will improve Outcome: Progressing   

## 2021-05-14 DIAGNOSIS — I50810 Right heart failure, unspecified: Secondary | ICD-10-CM | POA: Diagnosis not present

## 2021-05-14 LAB — BASIC METABOLIC PANEL
Anion gap: 12 (ref 5–15)
BUN: 67 mg/dL — ABNORMAL HIGH (ref 8–23)
CO2: 25 mmol/L (ref 22–32)
Calcium: 9.4 mg/dL (ref 8.9–10.3)
Chloride: 94 mmol/L — ABNORMAL LOW (ref 98–111)
Creatinine, Ser: 3.08 mg/dL — ABNORMAL HIGH (ref 0.61–1.24)
GFR, Estimated: 21 mL/min — ABNORMAL LOW (ref >60)
Glucose, Bld: 102 mg/dL — ABNORMAL HIGH (ref 70–99)
Potassium: 4 mmol/L (ref 3.5–5.1)
Sodium: 131 mmol/L — ABNORMAL LOW (ref 135–145)

## 2021-05-14 LAB — PROTIME-INR
INR: 2.8 — ABNORMAL HIGH (ref 0.8–1.2)
Prothrombin Time: 29.2 seconds — ABNORMAL HIGH (ref 11.4–15.2)

## 2021-05-14 NOTE — Progress Notes (Signed)
PT Cancellation Note  Patient Details Name: VYOM BRASS MRN: [id number] DOB: 06-Nov-1950   Cancelled Treatment:    Reason Eval/Treat Not Completed: Other (comment) Pt reports onset of continued nosebleed this morning, declines PT session until able to discuss with MD. Will continue to re-attempt as time/schedule allows.   West Carbo, PT, DPT   Acute Rehabilitation Department Pager #: [phone]   Sandra Cockayne 05/14/2021, 11:27 AM

## 2021-05-14 NOTE — Progress Notes (Signed)
[facility] for warfarin Indication:  mechanical mitral valve, chronic aflutter  Allergies  Allergen Reactions   Penicillins Rash    **tolerated amoxicillin Feb 2023**  Did it involve swelling of the face/tongue/throat, SOB, or low BP? No Did it involve sudden or severe rash/hives, skin peeling, or any reaction on the inside of your mouth or nose? No Did you need to seek medical attention at a hospital or doctor's office? No When did it last happen?      30 + years If all above answers are "NO", may proceed with cephalosporin use.     Patient Measurements: Height: 6\' 1"  (185.4 cm) Weight: 49.8 kg (109 lb 12.6 oz) IBW/kg (Calculated) : 79.9  Vital Signs: Temp: 97.5 F (36.4 C) (02/09 0836) Temp Source: Oral (02/09 0836) BP: 105/56 (02/09 0836) Pulse Rate: 75 (02/09 0836)  Labs: Recent Labs    05/12/21 0257 05/13/21 0300 05/14/21 0204  HGB 8.4* 8.2*  --   HCT 25.6* 26.0*  --   PLT 100* 107*  --   LABPROT 21.4* 27.6* 29.2*  INR 1.9* 2.6* 2.8*  CREATININE 2.73* 2.96* 3.08*     Estimated Creatinine Clearance: 16.2 mL/min (A) (by C-G formula based on SCr of 3.08 mg/dL (H)).   Medical History: Past Medical History:  Diagnosis Date   Allergy    Aneurysm of ascending aorta    Atrial fibrillation (HCC)    BPH (benign prostatic hyperplasia)    Colon polyps    FHx: rheumatic heart disease    H/O diplopia    Hepatitis C    Hypertension    Hypogonadism male    Mitral valve disease    mitral valve repair/replacement x3   OSA (obstructive sleep apnea) 12/15/2020    Assessment: 71 year old male with history of mitral valve replacement x 3, currently with mechanical valve. Patient being admitted after RHC. INR elevated initially at 3.7. No hematoma noted post cath.   Patient had nose bleeds after cath. ENT consulted and packing now in place. He is receiving amoxicillin as he has tolerated this prior to dental procedures in the past  while packing in place.   INR trending up and within goal today at 2.8. Hgb 8.2, plt 107 - stable. Left packing removed 2/7, now bleeding again; right nasal packing in place.  Warfarin dosing prior to admit was 5mg  MWF and 2.5mg  all other days, which was adjusted for cath. Prior visit shows regimen as 2.5mg  on Sun/Thur and 5mg  all other days.   Goal of Therapy:  INR goal 2.5-3.5 Monitor platelets by anticoagulation protocol: Yes   Plan:  Given ongoing bleeding will hold warfarin tonight and follow up recs by ENT  Thank you for allowing pharmacy to participate in this patient's care.  Erin Hearing PharmD., BCPS Clinical Pharmacist 05/14/2021 9:32 AM

## 2021-05-14 NOTE — Progress Notes (Signed)
Subjective: Left-sided epistaxis after packing removal yesterday.  No bleeding from the right side.  Objective: Vital signs in last 24 hours: Temp:  [97.3 F (36.3 C)-97.5 F (36.4 C)] 97.5 F (36.4 C) (02/09 0836) Pulse Rate:  [64-81] 75 (02/09 0836) Resp:  [14-21] 18 (02/09 0836) BP: (92-112)/(56-67) 105/56 (02/09 0836) SpO2:  [98 %-100 %] 98 % (02/09 0836) Weight:  [49.8 kg] 49.8 kg (02/09 1884)  Physical Exam: General appearance: alert and cooperative Head: Normocephalic, without obvious abnormality, atraumatic Eyes: Pupils are equal, round, reactive to light. Extraocular motion is intact.  Ears: Examination of the ears shows normal auricles and external auditory canals bilaterally.  Nose: Nasal examination shows right nasal packing in place.  Bleeding is noted from the left nasal cavity.  A 10 cm Merocel packing is replaced. Face: Facial examination shows no asymmetry. Palpation of the face elicit no significant tenderness.  Mouth: Oral cavity examination shows no mucosal lacerations. No significant trismus is noted.  Neck: Palpation of the neck reveals no lymphadenopathy or mass. The trachea is midline.   Recent Labs    05/12/21 0257 05/13/21 0300  WBC 5.2 4.9  HGB 8.4* 8.2*  HCT 25.6* 26.0*  PLT 100* 107*   Recent Labs    05/13/21 0300 05/14/21 0204  NA 131* 131*  K 4.3 4.0  CL 95* 94*  CO2 24 25  GLUCOSE 104* 102*  BUN 74* 67*  CREATININE 2.96* 3.08*  CALCIUM 9.0 9.4    Medications: I have reviewed the patient's current medications. Scheduled:  amoxicillin  250 mg Oral Q12H   feeding supplement  237 mL Oral BID BM   furosemide  80 mg Oral Daily   midodrine  5 mg Oral TID WC   multivitamin  1 tablet Oral QHS   polyethylene glycol  17 g Oral Daily   sodium chloride flush  3 mL Intravenous Q12H   Continuous:  sodium chloride      Assessment/Plan: Recurrent bilateral epistaxis -The left nasal packing is replaced.  Will leave the right packing in  place until this weekend. -In light of his recurrent bleeding, the patient will need to undergo embolization by IR. -Consider stopping his Coumadin. -Will follow.   LOS: 9 days   Shawnetta Lein W Amillya Chavira 05/14/2021, 12:05 PM

## 2021-05-14 NOTE — Care Management Important Message (Signed)
Important Message  Patient Details  Name: AUGUSTUS ZURAWSKI MRN: [id number] Date of Birth: June 27, 1950   Medicare Important Message Given:  Yes     Shelda Altes 05/14/2021, 9:10 AM

## 2021-05-14 NOTE — Progress Notes (Signed)
° °  I called to Dr Benjamine Mola regarding recurrent epistaxis.   He will see today. If he bleeds after packing will need to involve IR for embolization.   Dr Benjamine Mola expertise appreciated.   Arrionna Serena NP-C  11:10 AM

## 2021-05-14 NOTE — Progress Notes (Addendum)
Advanced Heart Failure Rounding Note  PCP-Cardiologist: Sinclair Grooms, MD   Subjective:   2/2 ENT consulted for nose bleed. L nare packed.  2/6 R nare packed.  2/7 L nare packing removed.  2/8 Given dose of metolazone.   Milrinone stopped 2/3.    INR 2.8 today.   Overnight nose bleed started again on left. He is frustrated and wants it fixed.      Objective:   Weight Range: 49.8 kg Body mass index is 14.48 kg/m.   Vital Signs:   Temp:  [97.3 F (36.3 C)-97.5 F (36.4 C)] 97.5 F (36.4 C) (02/09 0836) Pulse Rate:  [64-81] 75 (02/09 0836) Resp:  [14-21] 18 (02/09 0836) BP: (92-112)/(56-67) 105/56 (02/09 0836) SpO2:  [98 %-100 %] 98 % (02/09 0836) Weight:  [49.8 kg] 49.8 kg (02/09 0633) Last BM Date: 05/13/21  Weight change: Filed Weights   05/12/21 0421 05/13/21 0612 05/14/21 1829  Weight: 49 kg 49.9 kg 49.8 kg    Intake/Output:   Intake/Output Summary (Last 24 hours) at 05/14/2021 0923 Last data filed at 05/14/2021 0841 Gross per 24 hour  Intake 1080 ml  Output 2000 ml  Net -920 ml      Physical Exam  General: Thin frail. No resp difficulty HEENT: L nare nose bleed with  Neck: supple. JVP 7-8 . Carotids 2+ bilat; no bruits. No lymphadenopathy or thryomegaly appreciated. Cor: PMI nondisplaced. Irregular rate & rhythm. No rubs, gallops or murmurs. Lungs: clear Abdomen: soft, nontender, nondistended. No hepatosplenomegaly. No bruits or masses. Good bowel sounds. Extremities: no cyanosis, clubbing, rash, edema Neuro: alert & orientedx3, cranial nerves grossly intact. moves all 4 extremities w/o difficulty. Affect pleasant   Telemetry    Afib 60-70s    Labs    CBC Recent Labs    05/12/21 0257 05/13/21 0300  WBC 5.2 4.9  HGB 8.4* 8.2*  HCT 25.6* 26.0*  MCV 100.8* 102.8*  PLT 100* 937*   Basic Metabolic Panel Recent Labs    05/13/21 0300 05/14/21 0204  NA 131* 131*  K 4.3 4.0  CL 95* 94*  CO2 24 25  GLUCOSE 104* 102*  BUN 74*  67*  CREATININE 2.96* 3.08*  CALCIUM 9.0 9.4   Liver Function Tests No results for input(s): AST, ALT, ALKPHOS, BILITOT, PROT, ALBUMIN in the last 72 hours.  No results for input(s): LIPASE, AMYLASE in the last 72 hours. Cardiac Enzymes No results for input(s): CKTOTAL, CKMB, CKMBINDEX, TROPONINI in the last 72 hours.  BNP: BNP (last 3 results) Recent Labs    04/24/21 1231 04/30/21 1235  BNP 1,444.9* 898.1*    ProBNP (last 3 results) Recent Labs    07/25/20 1036  PROBNP 8,332*     D-Dimer No results for input(s): DDIMER in the last 72 hours. Hemoglobin A1C No results for input(s): HGBA1C in the last 72 hours. Fasting Lipid Panel No results for input(s): CHOL, HDL, LDLCALC, TRIG, CHOLHDL, LDLDIRECT in the last 72 hours. Thyroid Function Tests No results for input(s): TSH, T4TOTAL, T3FREE, THYROIDAB in the last 72 hours.  Invalid input(s): FREET3  Other results:   Imaging    No results found.   Medications:     Scheduled Medications:  amoxicillin  250 mg Oral Q12H   feeding supplement  237 mL Oral BID BM   furosemide  80 mg Oral Daily   midodrine  5 mg Oral TID WC   multivitamin  1 tablet Oral QHS   polyethylene glycol  17 g Oral Daily   sodium chloride flush  3 mL Intravenous Q12H   Warfarin - Pharmacist Dosing Inpatient   Does not apply q1600    Infusions:  sodium chloride      PRN Medications: sodium chloride, acetaminophen, ondansetron (ZOFRAN) IV, phenylephrine, sodium chloride, sodium chloride flush, traMADol, traZODone    Patient Profile   71 y.o. 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 Admitted with acute on chronic CHF.  Assessment/Plan   1.  Acute on chronic systolic CHF with low output/valvular HF with mixed moderate to severe PH/cor pulmonale in setting of longstanding MV disease - Echo 7/20  LVEF normal. RV severely dilated and HK. RVSP 55 -  RHC 8/20 with significantly elevated biventricular pressures, moderate to severe mixed PAH and low output (CI 1.96) - M-spike negative. PYP 1/21 negative for amyloid.  - Echo (6/22): newly reduced EF 35-40%, RV severely reduced, moderate AR, mod/severe PR. (Dr. Haroldine Laws felt EF 50-55%). - Lexiscan stress MPI, 08/22: Partially reversible basal to mid anterior defect consistent w/ infarct and peri-infarct ischemia EF 54% - RHC on admit with elevated filling pressures and low output. CO 2.03. PA sat 43%. Prominent v waves on PCWP tracing.  - Echo 01/31: EF 50-55%, RV severely reduced, RVSP 59 mmHg, BAE, mean gradient 6 mmHg across mitral valve prosthesis, no MR - On admit was placed on milrinone for RV support. Stopped 2/3  - Volume status ok. Continue lasix 80 mg daily. Continue metolazone twice  a week on Wed/Sat.  - Check BMET in am.   - Hold spiro with CKD - No Entresto/ digoxin w/ CKD. - Continue midodrine 5 TID for BP support  - Given fraility and previous sternotomy x 3, not candidate for advanced therapies. Pallaitive Care now involved. Suspect he will d/c home with Hospice or at least Palliative Care services. Now DNR/DNI   2. 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   - Echo 06/22: EF 35-40%, RV severely reduced, mechanical mitral valve with trivial MR - Prominent v waves on PCWP on RHC. Mitral valve prosthesis appears okay on TTE  - Aware of need for SBE prophylaxis.  - INR  2.8 today.  - With ongoing nose bleed ? Lower INR goal. Discussed dosing with PharmD personally.   3.  Chronic a flutter  - Rate controlled - Continue AC   4.  Thoracic aneurysm, followed by Dr. Cyndia Bent  - CT chest 6/20 asc ao aorta 4.4 cm.   - 48 mm on echo this admit - Not surgical candidate.   5. AKI on Stage IV CKD - Followed by nephrology. - Baseline SCr~2.4. - Scr 3>3.1 today  - Suspect cardiorenal - C/w midodrine to support renal perfusion   6. 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 Negative for high-grade dysplasia/carcinoma - Followed routinely by GI.  7. Anemia -Check CBC in am.   8. Pancytopenia -Chronic/stable. - Check CBC in am.   9. OSA - Severe, newly diagnosed.  - Awaiting CPAP titration.  - Dr. Radford Pax following.   10. Epistaxis  - persistent nosebleed despite conservative measures - ENT consulted. Had L nare packed on 2/3 R nare pack 2/6.  Left packing removed 2.7. L nare bleeding agin.  -Plan to continue R packing until 2/10 - On amoxicillin while he has nose packing.  - Will ask ENT to come see today.  11. FTT - Previously working with a nutritionist. - Nearing end-stage RV failure.  - Palliative Care consulted   12. DNR/DNI - Palliative Care Team appreciated.  - see discussion above  13. Deconditioning - Ambulate w/ PT - HH PT recommended   14. Severe protein calorie malnutrition - Nutrition following  15. Hematuria, painless -  UA + hematuria. Urine cytology pending  -  Renal u/s - no mass or obstruction  - Outpatient f/u with urology  16. Hyponatremia - Na 131 today   Plan to d/c home with [facility]. [facility] setting up DME.   Will need to get epistaxis under control.      Length of Stay: [place], NP  05/14/2021, 9:23 AM  Advanced Heart Failure Team Pager [phone] (M-F; 7a - 5p)  Please contact [facility] for night-coverage after hours (5p -7a ) and weekends on [email or web address]  Patient seen and examined with the above-signed Advanced Practice Provider and/or Housestaff. I personally reviewed laboratory data, imaging studies and relevant notes. I independently examined the patient and formulated the important aspects of the plan. I have edited the note to reflect any of my changes or salient points. I have personally discussed the plan with the patient and/or family.   Continues with epistaxis,. Denies CP or SOB. Volume status stable. On warfain for mechanical MV. INR  2.8  General:  [place] sitting up in bed. Falls asleep easily.. No resp difficulty HEENT: normal +packing Neck: supple. JVP 10 + prominent v waves  Cor: irregular rate & rhythm. Mechanical s1. Lungs: clear Abdomen: soft, nontender, nondistended. No hepatosplenomegaly. No bruits or masses. Good bowel sounds. Extremities: no cyanosis, clubbing, rash, edema Neuro: alert & orientedx3, cranial nerves grossly intact. moves all 4 extremities w/o difficulty. Affect pleasant  He is about as good as we can get him from HF perspective. Planning d.c home with Hospice once epistaxis resolved. ENT suggesting IR embolization. D/w IR and Dr. Debbrah Alar' team. Will plan embolization tomorrow as schedule permits.  Shavonne Ambroise. MD

## 2021-05-14 NOTE — Progress Notes (Signed)
°  Appreciate Dr Benjamine Mola  He has recommended IR consult for embolization for recurrent epistaxis.   Order placed.   [place] Skalicky NP-C  12:28 PM

## 2021-05-14 NOTE — Progress Notes (Signed)
Palliative-   Chart checking- noted plan for d/c with [facility] and then transition to Hospice.  PMT will continue to monitor for any Palliative needs.   Mariana Kaufman, AGNP-C Palliative Medicine  No charge

## 2021-05-14 NOTE — Plan of Care (Signed)
  Problem: Clinical Measurements: Goal: Diagnostic test results will improve Outcome: Progressing Goal: Respiratory complications will improve Outcome: Progressing   Problem: Activity: Goal: Risk for activity intolerance will decrease Outcome: Progressing   

## 2021-05-15 ENCOUNTER — Inpatient Hospital Stay (HOSPITAL_COMMUNITY): Payer: Medicare HMO | Admitting: Anesthesiology

## 2021-05-15 ENCOUNTER — Other Ambulatory Visit (HOSPITAL_COMMUNITY): Payer: Self-pay

## 2021-05-15 ENCOUNTER — Other Ambulatory Visit (HOSPITAL_COMMUNITY): Payer: Self-pay | Admitting: *Deleted

## 2021-05-15 ENCOUNTER — Inpatient Hospital Stay (HOSPITAL_COMMUNITY): Payer: Medicare HMO

## 2021-05-15 ENCOUNTER — Encounter (HOSPITAL_COMMUNITY): Payer: Self-pay | Admitting: Internal Medicine

## 2021-05-15 ENCOUNTER — Other Ambulatory Visit (HOSPITAL_COMMUNITY): Payer: Self-pay | Admitting: Emergency Medicine

## 2021-05-15 ENCOUNTER — Encounter (HOSPITAL_COMMUNITY)
Admission: AD | Disposition: A | Discharge status: Hospice | Payer: Self-pay | Source: Home / Self Care | Attending: Internal Medicine

## 2021-05-15 DIAGNOSIS — N189 Chronic kidney disease, unspecified: Secondary | ICD-10-CM

## 2021-05-15 DIAGNOSIS — N184 Chronic kidney disease, stage 4 (severe): Secondary | ICD-10-CM | POA: Diagnosis not present

## 2021-05-15 DIAGNOSIS — I50810 Right heart failure, unspecified: Secondary | ICD-10-CM | POA: Diagnosis not present

## 2021-05-15 DIAGNOSIS — Z7189 Other specified counseling: Secondary | ICD-10-CM | POA: Diagnosis not present

## 2021-05-15 DIAGNOSIS — D631 Anemia in chronic kidney disease: Secondary | ICD-10-CM

## 2021-05-15 DIAGNOSIS — R040 Epistaxis: Secondary | ICD-10-CM

## 2021-05-15 DIAGNOSIS — I129 Hypertensive chronic kidney disease with stage 1 through stage 4 chronic kidney disease, or unspecified chronic kidney disease: Secondary | ICD-10-CM

## 2021-05-15 HISTORY — PX: IR ANGIO EXTERNAL CAROTID SEL EXT CAROTID BILAT MOD SED: IMG5372

## 2021-05-15 HISTORY — PX: IR ANGIOGRAM FOLLOW UP STUDY: IMG697

## 2021-05-15 HISTORY — PX: IR NEURO EACH ADD'L AFTER BASIC UNI RIGHT (MS): IMG5374

## 2021-05-15 HISTORY — PX: RADIOLOGY WITH ANESTHESIA: SHX6223

## 2021-05-15 HISTORY — PX: IR US GUIDE VASC ACCESS RIGHT: IMG2390

## 2021-05-15 HISTORY — PX: IR TRANSCATH/EMBOLIZ: IMG695

## 2021-05-15 HISTORY — PX: IR NEURO EACH ADD'L AFTER BASIC UNI LEFT (MS): IMG5373

## 2021-05-15 HISTORY — PX: IR ANGIO INTRA EXTRACRAN SEL INTERNAL CAROTID BILAT MOD SED: IMG5363

## 2021-05-15 LAB — CBC
HCT: 27.6 % — ABNORMAL LOW (ref 39.0–52.0)
Hemoglobin: 8.8 g/dL — ABNORMAL LOW (ref 13.0–17.0)
MCH: 32.6 pg (ref 26.0–34.0)
MCHC: 31.9 g/dL (ref 30.0–36.0)
MCV: 102.2 fL — ABNORMAL HIGH (ref 80.0–100.0)
Platelets: 102 10*3/uL — ABNORMAL LOW (ref 150–400)
RBC: 2.7 MIL/uL — ABNORMAL LOW (ref 4.22–5.81)
RDW: 32 % — ABNORMAL HIGH (ref 11.5–15.5)
WBC: 5.6 10*3/uL (ref 4.0–10.5)
nRBC: 0 % (ref 0.0–0.2)

## 2021-05-15 LAB — POCT I-STAT 7, (LYTES, BLD GAS, ICA,H+H)
Acid-Base Excess: 5 mmol/L — ABNORMAL HIGH (ref 0.0–2.0)
Acid-Base Excess: 2 mmol/L (ref 0.0–2.0)
Bicarbonate: 29.2 mmol/L — ABNORMAL HIGH (ref 20.0–28.0)
Bicarbonate: 27.1 mmol/L (ref 20.0–28.0)
Calcium, Ion: 1.2 mmol/L (ref 1.15–1.40)
Calcium, Ion: 1.14 mmol/L — ABNORMAL LOW (ref 1.15–1.40)
HCT: 28 % — ABNORMAL LOW (ref 39.0–52.0)
HCT: 26 % — ABNORMAL LOW (ref 39.0–52.0)
Hemoglobin: 9.5 g/dL — ABNORMAL LOW (ref 13.0–17.0)
Hemoglobin: 8.8 g/dL — ABNORMAL LOW (ref 13.0–17.0)
O2 Saturation: 95 %
O2 Saturation: 100 %
Patient temperature: 35.3
Potassium: 2.9 mmol/L — ABNORMAL LOW (ref 3.5–5.1)
Potassium: 3.5 mmol/L (ref 3.5–5.1)
Sodium: 137 mmol/L (ref 135–145)
Sodium: 135 mmol/L (ref 135–145)
TCO2: 30 mmol/L (ref 22–32)
TCO2: 28 mmol/L (ref 22–32)
pCO2 arterial: 39.9 mmHg (ref 32.0–48.0)
pCO2 arterial: 40.5 mmHg (ref 32.0–48.0)
pH, Arterial: 7.472 — ABNORMAL HIGH (ref 7.350–7.450)
pH, Arterial: 7.426 (ref 7.350–7.450)
pO2, Arterial: 71 mmHg — ABNORMAL LOW (ref 83.0–108.0)
pO2, Arterial: 243 mmHg — ABNORMAL HIGH (ref 83.0–108.0)

## 2021-05-15 LAB — BASIC METABOLIC PANEL
Anion gap: 17 — ABNORMAL HIGH (ref 5–15)
Anion gap: 13 (ref 5–15)
BUN: 68 mg/dL — ABNORMAL HIGH (ref 8–23)
BUN: 58 mg/dL — ABNORMAL HIGH (ref 8–23)
CO2: 21 mmol/L — ABNORMAL LOW (ref 22–32)
CO2: 22 mmol/L (ref 22–32)
Calcium: 9.3 mg/dL (ref 8.9–10.3)
Calcium: 8.3 mg/dL — ABNORMAL LOW (ref 8.9–10.3)
Chloride: 93 mmol/L — ABNORMAL LOW (ref 98–111)
Chloride: 99 mmol/L (ref 98–111)
Creatinine, Ser: 2.91 mg/dL — ABNORMAL HIGH (ref 0.61–1.24)
Creatinine, Ser: 2.95 mg/dL — ABNORMAL HIGH (ref 0.61–1.24)
GFR, Estimated: 23 mL/min — ABNORMAL LOW (ref >60)
GFR, Estimated: 22 mL/min — ABNORMAL LOW (ref >60)
Glucose, Bld: 100 mg/dL — ABNORMAL HIGH (ref 70–99)
Glucose, Bld: 94 mg/dL (ref 70–99)
Potassium: 2.7 mmol/L — CL (ref 3.5–5.1)
Potassium: 3 mmol/L — ABNORMAL LOW (ref 3.5–5.1)
Sodium: 131 mmol/L — ABNORMAL LOW (ref 135–145)
Sodium: 134 mmol/L — ABNORMAL LOW (ref 135–145)

## 2021-05-15 LAB — MAGNESIUM: Magnesium: 2.1 mg/dL (ref 1.7–2.4)

## 2021-05-15 LAB — PROTIME-INR
INR: 2.6 — ABNORMAL HIGH (ref 0.8–1.2)
Prothrombin Time: 27.8 seconds — ABNORMAL HIGH (ref 11.4–15.2)

## 2021-05-15 SURGERY — IR WITH ANESTHESIA
Anesthesia: General

## 2021-05-15 MED ORDER — VERAPAMIL HCL 2.5 MG/ML IV SOLN
INTRAVENOUS | Status: AC
  Filled 2021-05-15: qty 2

## 2021-05-15 MED ORDER — POTASSIUM CHLORIDE CRYS ER 20 MEQ PO TBCR
40.0000 meq | EXTENDED_RELEASE_TABLET | Freq: Two times a day (BID) | ORAL | Status: DC
Start: 2021-05-15 — End: 2021-05-15

## 2021-05-15 MED ORDER — ETOMIDATE 2 MG/ML IV SOLN
INTRAVENOUS | Status: DC | PRN
  Administered 2021-05-15: 12 mg via INTRAVENOUS

## 2021-05-15 MED ORDER — METOPROLOL TARTRATE 5 MG/5ML IV SOLN
INTRAVENOUS | Status: DC | PRN
  Administered 2021-05-15 (×3): 1 mg via INTRAVENOUS

## 2021-05-15 MED ORDER — NITROGLYCERIN 1 MG/10 ML FOR IR/CATH LAB
INTRA_ARTERIAL | Status: AC
  Filled 2021-05-15: qty 10

## 2021-05-15 MED ORDER — NITROGLYCERIN 1 MG/10 ML FOR IR/CATH LAB
INTRA_ARTERIAL | Status: AC | PRN
  Administered 2021-05-15: 300 ug via INTRA_ARTERIAL

## 2021-05-15 MED ORDER — FENTANYL CITRATE (PF) 100 MCG/2ML IJ SOLN: 25.0000 ug | INTRAMUSCULAR | Status: DC | PRN

## 2021-05-15 MED ORDER — IOHEXOL 300 MG/ML  SOLN
100.0000 mL | Freq: Once | INTRAMUSCULAR | Status: AC | PRN
  Administered 2021-05-15: 40 mL via INTRA_ARTERIAL

## 2021-05-15 MED ORDER — FENTANYL CITRATE (PF) 100 MCG/2ML IJ SOLN
INTRAMUSCULAR | Status: AC
  Filled 2021-05-15: qty 2

## 2021-05-15 MED ORDER — SODIUM CHLORIDE 0.9 % IV SOLN: INTRAVENOUS | Status: DC | PRN

## 2021-05-15 MED ORDER — METOLAZONE 2.5 MG PO TABS
2.5000 mg | ORAL_TABLET | Freq: Once | ORAL | Status: AC
  Administered 2021-05-16: 2.5 mg via ORAL
  Filled 2021-05-15: qty 1

## 2021-05-15 MED ORDER — VERAPAMIL HCL 2.5 MG/ML IV SOLN
INTRAVENOUS | Status: AC | PRN
Start: 2021-05-15 — End: 2021-05-15
  Administered 2021-05-15: 2.5 mg via INTRA_ARTERIAL

## 2021-05-15 MED ORDER — POTASSIUM CHLORIDE CRYS ER 20 MEQ PO TBCR
40.0000 meq | EXTENDED_RELEASE_TABLET | Freq: Two times a day (BID) | ORAL | Status: AC
  Administered 2021-05-15 – 2021-05-16 (×2): 40 meq via ORAL
  Filled 2021-05-15: qty 2

## 2021-05-15 MED ORDER — FENTANYL CITRATE (PF) 100 MCG/2ML IJ SOLN
INTRAMUSCULAR | Status: DC | PRN
  Administered 2021-05-15: 50 ug via INTRAVENOUS

## 2021-05-15 MED ORDER — POTASSIUM CHLORIDE 10 MEQ/100ML IV SOLN
10.0000 meq | INTRAVENOUS | Status: DC
  Administered 2021-05-15 (×3): 10 meq via INTRAVENOUS
  Filled 2021-05-15 (×2): qty 100

## 2021-05-15 MED ORDER — CEFAZOLIN SODIUM-DEXTROSE 2-4 GM/100ML-% IV SOLN
INTRAVENOUS | Status: AC
  Filled 2021-05-15: qty 100

## 2021-05-15 MED ORDER — POTASSIUM CHLORIDE 10 MEQ/100ML IV SOLN: 10.0000 meq | INTRAVENOUS | Status: DC

## 2021-05-15 MED ORDER — PHENYLEPHRINE HCL-NACL 20-0.9 MG/250ML-% IV SOLN
INTRAVENOUS | Status: DC | PRN
Start: 2021-05-15 — End: 2021-05-15
  Administered 2021-05-15: 50 ug/min via INTRAVENOUS

## 2021-05-15 MED ORDER — ESMOLOL HCL 100 MG/10ML IV SOLN
INTRAVENOUS | Status: DC | PRN
  Administered 2021-05-15: 30 mg via INTRAVENOUS
  Administered 2021-05-15: 10 mg via INTRAVENOUS
  Administered 2021-05-15 (×2): 30 mg via INTRAVENOUS

## 2021-05-15 MED ORDER — ROCURONIUM BROMIDE 10 MG/ML (PF) SYRINGE
PREFILLED_SYRINGE | INTRAVENOUS | Status: DC | PRN
  Administered 2021-05-15: 60 mg via INTRAVENOUS
  Administered 2021-05-15: 40 mg via INTRAVENOUS

## 2021-05-15 MED ORDER — LIDOCAINE HCL 1 % IJ SOLN
INTRAMUSCULAR | Status: AC
  Filled 2021-05-15: qty 20

## 2021-05-15 MED ORDER — PHENYLEPHRINE 40 MCG/ML (10ML) SYRINGE FOR IV PUSH (FOR BLOOD PRESSURE SUPPORT)
PREFILLED_SYRINGE | INTRAVENOUS | Status: DC | PRN
  Administered 2021-05-15: 40 ug via INTRAVENOUS
  Administered 2021-05-15: 80 ug via INTRAVENOUS

## 2021-05-15 MED ORDER — MIDAZOLAM HCL 2 MG/2ML IJ SOLN: 1.0000 mg | Freq: Once | INTRAMUSCULAR | Status: AC

## 2021-05-15 MED ORDER — AMOXICILLIN 250 MG PO CAPS
250.0000 mg | ORAL_CAPSULE | Freq: Two times a day (BID) | ORAL | Status: DC
  Administered 2021-05-15 – 2021-05-16 (×2): 250 mg via ORAL
  Filled 2021-05-15 (×3): qty 1

## 2021-05-15 MED ORDER — HEPARIN SODIUM (PORCINE) 1000 UNIT/ML IJ SOLN
INTRAMUSCULAR | Status: AC
  Filled 2021-05-15: qty 10

## 2021-05-15 MED ORDER — CEFAZOLIN SODIUM-DEXTROSE 2-3 GM-%(50ML) IV SOLR
INTRAVENOUS | Status: DC | PRN
  Administered 2021-05-15: 2 g via INTRAVENOUS

## 2021-05-15 MED ORDER — SUGAMMADEX SODIUM 200 MG/2ML IV SOLN
INTRAVENOUS | Status: DC | PRN
  Administered 2021-05-15 (×2): 200 mg via INTRAVENOUS

## 2021-05-15 MED ORDER — POTASSIUM CHLORIDE CRYS ER 20 MEQ PO TBCR
40.0000 meq | EXTENDED_RELEASE_TABLET | ORAL | Status: DC
  Administered 2021-05-15: 40 meq via ORAL
  Filled 2021-05-15 (×2): qty 2

## 2021-05-15 MED ORDER — POTASSIUM CHLORIDE CRYS ER 20 MEQ PO TBCR
40.0000 meq | EXTENDED_RELEASE_TABLET | Freq: Two times a day (BID) | ORAL | Status: AC
  Filled 2021-05-15: qty 2

## 2021-05-15 MED ORDER — MIDAZOLAM HCL 2 MG/2ML IJ SOLN
INTRAMUSCULAR | Status: AC
  Administered 2021-05-15: 1 mg via INTRAVENOUS
  Filled 2021-05-15: qty 2

## 2021-05-15 NOTE — Plan of Care (Signed)
  Problem: Clinical Measurements: Goal: Respiratory complications will improve Outcome: Progressing   Problem: Coping: Goal: Level of anxiety will decrease Outcome: Progressing   Problem: Safety: Goal: Ability to remain free from injury will improve Outcome: Progressing   

## 2021-05-15 NOTE — Anesthesia Preprocedure Evaluation (Addendum)
Anesthesia Evaluation  Patient identified by MRN, date of birth, ID band Patient awake    Reviewed: Allergy & Precautions, NPO status , Patient's Chart, lab work & pertinent test results  Airway Mallampati: II  TM Distance: >3 FB Neck ROM: Full    Dental no notable dental hx.    Pulmonary sleep apnea , former smoker,    Pulmonary exam normal        Cardiovascular hypertension, Pt. 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)   Neuro/Psych negative neurological ROS  negative psych ROS   GI/Hepatic negative GI ROS, (+) Hepatitis -, C  Endo/Other    Renal/GU CRFRenal disease  negative genitourinary   Musculoskeletal negative musculoskeletal ROS (+)   Abdominal Normal abdominal exam  (+)   Peds  Hematology  (+) Blood dyscrasia, anemia ,   Anesthesia Other Findings   Reproductive/Obstetrics                            Anesthesia Physical Anesthesia Plan  ASA: 4  Anesthesia Plan: General   Post-op Pain Management:    Induction: Intravenous  PONV Risk Score and Plan: 2 and Ondansetron and Treatment may vary due to age or medical condition  Airway Management Planned: Mask and Oral ETT  Additional Equipment: Arterial line  Intra-op Plan:   Post-operative Plan: Possible Post-op intubation/ventilation  Informed Consent: I have reviewed the patients History and Physical, chart, labs and discussed the procedure including the risks, benefits and alternatives for the proposed anesthesia with the patient or authorized representative who has indicated his/her understanding and acceptance.     Dental advisory given  Plan Discussed with: CRNA  Anesthesia Plan Comments: (Lab Results      Component                 Value               Date                      WBC                      5.6                 05/15/2021                HGB                      8.8 (L)             05/15/2021                HCT                      27.6 (L)            05/15/2021                MCV                      102.2 (H)           05/15/2021                PLT  102 (L)             05/15/2021           Lab Results      Component                Value               Date                      NA                       131 (L)             05/15/2021                K                        2.7 (LL)            05/15/2021                CO2                      21 (L)              05/15/2021                GLUCOSE                  100 (H)             05/15/2021                BUN                      68 (H)              05/15/2021                CREATININE               2.91 (H)            05/15/2021                CALCIUM                  9.3                 05/15/2021                EGFR                     26 (L)              08/21/2020                GFRNONAA                 23 (L)              05/15/2021           Lab Results      Component                Value               Date                      INR  2.6 (H)             05/15/2021                INR                      2.8 (H)             05/14/2021                INR                      2.6 (H)             05/13/2021           ECHO 1/23: 1. Left ventricular ejection fraction, by estimation, is 50 to 55%. The  left ventricle has low normal function. The left ventricle demonstrates  global hypokinesis. There is moderate left ventricular hypertrophy. Left  ventricular diastolic parameters are  indeterminate.  2. Right ventricular systolic function is severely reduced. The right  ventricular size is moderately enlarged. There is moderately elevated  pulmonary artery systolic pressure. The estimated right ventricular   systolic pressure is 55.2 mmHg.  3. Left atrial size was severely dilated.  4. Right atrial size was moderately dilated.  5. The mitral valve has been repaired/replaced. No evidence of mitral  valve regurgitation. No evidence of mitral stenosis. The mean mitral valve  gradient is 6.0 mmHg. There is a mechanical valve present in the mitral  position. Echo findings are  consistent with normal structure and function of the mitral valve  prosthesis.  6. The aortic valve is tricuspid. There is moderate calcification of the  aortic valve. There is moderate thickening of the aortic valve. Aortic  valve regurgitation is moderate. Aortic valve sclerosis/calcification is  present, without any evidence of  aortic stenosis.  7. Aortic dilatation noted. There is moderate dilatation of the ascending  aorta, measuring 48 mm. There is moderate dilatation of the aortic root,  measuring 47 mm.  8. The inferior vena cava is normal in size with greater than 50%  respiratory variability, suggesting right atrial pressure of 3 mmHg.   05/05/21: RA = 15 RV = 70/14 PA = 70/29 (46) PCW = 22 (v = 35) Fick cardiac output/index = 3.5/2.0 PVR = 7.4 WU FA sat = 97% PA sat = 42%, 44% PAPi = 2.7  Assessment: 1. 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 Prominent v- waves in PCW tracing suggestive of significant MR vs severe diastolic dysfunction (may need TEE to further evlauate) 3. MV leaflet excursion appears normal on fluoro )       Anesthesia Quick Evaluation

## 2021-05-15 NOTE — H&P (Signed)
Chief Complaint: Patient was seen in consultation today for epistaxis  at the request of Dr. Darrick Grinder  Supervising Physician: Pedro Earls  Patient Status: [facility] - Out-pt  History of Present Illness: [name] is a 71 y.o. male who had an elective heart cath on 05/05/21.  His hospital course has been complicated by persistent epistaxis.  He has had packing since 2/2.  He experiences fatigue with walking, occasional SOB.  He has a past medical history of rheumatic heart disease, CHF, mitral valve replacement x 3, chronic a flutter, chronic kidney disease stage 4, chronic anemia, admitted on 05/05/2021 for R heart cath and IV diuresis for heart failure exacerbation.  He is not a candidate for advanced heart failure therapies and is expected to discharge with hospice.  [name] endorses being more tired than usual and having a headache.  He denies N/V, SOB, CP or dizziness.  IR was consulted for embolization.  [name] has been on Coumadin and unable to come off due to replaced heart valve.  Past Medical History:  Diagnosis Date   Allergy    Aneurysm of ascending aorta    Atrial fibrillation (HCC)    BPH (benign prostatic hyperplasia)    Colon polyps    FHx: rheumatic heart disease    H/O diplopia    Hepatitis C    Hypertension    Hypogonadism male    Mitral valve disease    mitral valve repair/replacement x3   OSA (obstructive sleep apnea) 12/15/2020    Past Surgical History:  Procedure Laterality Date   BIOPSY  11/19/2019   Procedure: BIOPSY;  Surgeon: Yetta Flock, MD;  Location: [facility];  Service: Gastroenterology;;  EGD and COLON   COLON SURGERY     COLONOSCOPY     COLONOSCOPY N/A 11/14/2012   Procedure: COLONOSCOPY;  Surgeon: Lear Ng, MD;  Location: [facility];  Service: Endoscopy;  Laterality: N/A;   COLONOSCOPY WITH PROPOFOL N/A 11/19/2019   Procedure: COLONOSCOPY WITH PROPOFOL;  Surgeon: Yetta Flock,  MD;  Location: [facility];  Service: Gastroenterology;  Laterality: N/A;   COLONOSCOPY WITH PROPOFOL N/A 11/27/2019   Procedure: COLONOSCOPY WITH PROPOFOL;  Surgeon: Doran Stabler, MD;  Location: [facility];  Service: Gastroenterology;  Laterality: N/A;   ESOPHAGOGASTRODUODENOSCOPY (EGD) WITH PROPOFOL N/A 05/08/2014   Procedure: ESOPHAGOGASTRODUODENOSCOPY (EGD) WITH PROPOFOL;  Surgeon: Lear Ng, MD;  Location: [place];  Service: Endoscopy;  Laterality: N/A;   ESOPHAGOGASTRODUODENOSCOPY (EGD) WITH PROPOFOL N/A 11/19/2019   Procedure: ESOPHAGOGASTRODUODENOSCOPY (EGD) WITH PROPOFOL;  Surgeon: Yetta Flock, MD;  [facility]: [facility];  Service: Gastroenterology;  Laterality: N/A;   ESOPHAGOGASTRODUODENOSCOPY (EGD) WITH PROPOFOL N/A 05/07/2020   Procedure: ESOPHAGOGASTRODUODENOSCOPY (EGD) WITH PROPOFOL;  Surgeon: Yetta Flock, MD;  [facility]: [facility];  Service: Gastroenterology;  Laterality: N/A;   HEMOSTASIS CLIP PLACEMENT  11/19/2019   Procedure: HEMOSTASIS CLIP PLACEMENT;  Surgeon: Yetta Flock, MD;  Location: [facility];  Service: Gastroenterology;;   HEMOSTASIS CLIP PLACEMENT  11/27/2019   Procedure: HEMOSTASIS CLIP PLACEMENT;  Surgeon: Doran Stabler, MD;  [facility]: [facility];  Service: Gastroenterology;;   HEMOSTASIS CLIP PLACEMENT  05/07/2020   Procedure: HEMOSTASIS CLIP PLACEMENT;  Surgeon: Yetta Flock, MD;  Location: [facility];  Service: Gastroenterology;;   MITRAL VALVE REPLACEMENT     1973 (bioprosthesis), 1988 (St Jude mechanical vlave) , 2006 (repair due to leak)   POLYPECTOMY  11/19/2019   Procedure: POLYPECTOMY;  Surgeon: Havery Moros,  Carlota Raspberry, MD;  Location: [facility];  Service: Gastroenterology;;   POLYPECTOMY  05/07/2020   Procedure: POLYPECTOMY;  Surgeon: Yetta Flock, MD;  Location: [facility];  Service: Gastroenterology;;   RIGHT HEART CATH N/A 11/24/2018   Procedure: RIGHT HEART CATH;  Surgeon: Jolaine Artist, MD;  Location: [place] [facility];  Service: Cardiovascular;  Laterality: N/A;   RIGHT HEART CATH N/A 10/23/2019   Procedure: RIGHT HEART CATH;  Surgeon: Jolaine Artist, MD;  Location: [place] [facility];  Service: Cardiovascular;  Laterality: N/A;   RIGHT HEART CATH N/A 05/05/2021   Procedure: RIGHT HEART CATH;  Surgeon: Jolaine Artist, MD;  Location: [place] [facility];  Service: Cardiovascular;  Laterality: N/A;   SUBMUCOSAL LIFTING INJECTION  05/07/2020   Procedure: SUBMUCOSAL LIFTING INJECTION;  Surgeon: Yetta Flock, MD;  Location: [facility];  Service: Gastroenterology;;   UPPER GASTROINTESTINAL ENDOSCOPY     02-2010 and 05-2014    Allergies: Penicillins  Medications: Prior to Admission medications   Medication Sig Start Date End Date Taking? Authorizing Provider  Efinaconazole 10 % SOLN Apply to toenail daily for 48 weeks 06/17/20  Yes [name], MD  ferrous sulfate 325 (65 FE) MG tablet Take 1 tablet (325 mg total) by mouth every other day. In the evening. Patient taking differently: Take 325 mg by mouth every other day. In the evening. 12/02/19  Yes Nita Sells, MD  furosemide (LASIX) 40 MG tablet Take 1.5 tablets (60 mg total) by mouth daily. Patient taking differently: Take 40 mg by mouth daily. 04/24/21  Yes Milford, Maricela Bo, FNP  hydrOXYzine (ATARAX) 10 MG tablet Take 1 tablet (10 mg total) by mouth 3 (three) times daily as needed. 03/19/21  Yes Simmons-Robinson, Makiera, MD  metolazone (ZAROXOLYN) 2.5 MG tablet TAKE AS DIRECTED BY THE HEART FAILURE CLINIC Patient taking differently: Take 2.5 mg by mouth daily as needed (Fluid). Take as directed by the heart failure clinic 11/25/20  Yes Bensimhon, Shaune Pascal, MD  Multiple Vitamins-Minerals (MULTIVITAMIN WITH MINERALS) tablet Take 1 tablet by mouth daily.   Yes [provider]  polyethylene glycol (MIRALAX) 17 g packet Take 17 g by mouth daily as needed. 08/28/19  Yes Armbruster,  Carlota Raspberry, MD  potassium chloride SA (KLOR-CON M) 20 MEQ tablet Take 2 tablets (40 mEq total) by mouth daily. 03/11/21  Yes Larey Dresser, MD  sildenafil (REVATIO) 20 MG tablet Take 20 mg by mouth daily as needed (ED). 11/13/19  Yes [provider]  Testosterone 1.62 % GEL Apply 2 Pump topically daily. 1 pump on each arm 03/05/20  Yes [provider]  warfarin (COUMADIN) 5 MG tablet Take 1/2 a tablet to 1 tablet by mouth daily as directed by the coumadin clinic. Patient taking differently: Take 2.5-5 mg by mouth See admin instructions. Take 2.5 mg on Thursday, Saturday and Sunday all the other days take 5 mg as directed by the coumadin clinic. 04/15/21  Yes Belva Crome, MD  amoxicillin (AMOXIL) 500 MG capsule TAKE 4 CAPSULES BY MOUTH 1 HOUR BEFORE DENTAL APPOINTMENT 03/06/20   Belva Crome, MD  enoxaparin (LOVENOX) 60 MG/0.6ML injection Inject 0.6 mLs (60 mg total) into the skin daily. 04/24/20   Belva Crome, MD  spironolactone (ALDACTONE) 25 MG tablet Take 1 tablet (25 mg total) by mouth daily. Patient not taking: Reported on 04/29/2021 12/01/20   Bensimhon, Shaune Pascal, MD     Family History  Family history unknown: Yes  Social History   Socioeconomic History   Marital status: Divorced    Spouse name: Not on file   Number of children: 2   Years of education: 12   Highest education level: High school graduate  Occupational History   Not on file  Tobacco Use   Smoking status: Former    Types: Cigars    Quit date: 09/03/2017    Years since quitting: 3.6   Smokeless tobacco: Never  Vaping Use   Vaping Use: Never used  Substance and Sexual Activity   Alcohol use: No   Drug use: No   Sexual activity: Not Currently  Other Topics Concern   Not on file  Social History Narrative   Patient lives alone in [place].    Patient works out 4x a week on home exercise bike.    Patient plays guitar.    Social Determinants of Health   Financial Resource Strain: Low  Risk    Difficulty of Paying Living Expenses: Not very hard  Food Insecurity: No Food Insecurity   Worried About Charity fundraiser in the Last Year: Never true   Arboriculturist in the Last Year: Never true  Transportation Needs: Unmet Transportation Needs   Lack of Transportation (Medical): Yes   Lack of Transportation (Non-Medical): Yes  Physical Activity: Insufficiently Active   Days of Exercise per Week: 4 days   Minutes of Exercise per Session: 20 min  Stress: No Stress Concern Present   Feeling of Stress : Only a little  Social Connections: Socially Isolated   Frequency of Communication with Friends and Family: More than three times a week   Frequency of Social Gatherings with Friends and Family: More than three times a week   Attends Religious Services: Never   Marine scientist or Organizations: No   Attends Music therapist: Never   Marital Status: Divorced    Review of Systems  Constitutional:  Positive for activity change, appetite change, fatigue and unexpected weight change.  HENT:  Positive for nosebleeds.   Eyes: Negative.   Respiratory: Negative.    Cardiovascular:  Positive for palpitations.  Gastrointestinal: Negative.   Endocrine: Negative.   Genitourinary: Negative.   Musculoskeletal: Negative.   Skin: Negative.   Neurological:  Positive for headaches.  Hematological:  Bruises/bleeds easily.  Psychiatric/Behavioral: Negative.     Vital Signs: BP (!) 105/57 (BP Location: Right Arm)    Pulse 75    Temp 97.8 F (36.6 C)    Resp (!) 22    Ht 6\' 1"  (1.854 m)    Wt 106 lb 11.2 oz (48.4 kg)    SpO2 98%    BMI 14.08 kg/m   Physical Exam Constitutional:      Appearance: He is ill-appearing.     Comments: Easily nods off, awakes and is alert for conversation as necessary  HENT:     Head: Normocephalic and atraumatic.     Mouth/Throat:     Mouth: Mucous membranes are dry.     Pharynx: Oropharynx is clear.  Eyes:     Extraocular  Movements: Extraocular movements intact.  Cardiovascular:     Rate and Rhythm: Rhythm irregular.     Pulses: Normal pulses.  Pulmonary:     Effort: Pulmonary effort is normal.     Breath sounds: Normal breath sounds.  Abdominal:     General: Abdomen is flat.     Palpations: Abdomen is soft.  Skin:    General:  Skin is dry.     Findings: Bruising present.  Neurological:     General: No focal deficit present.    Imaging: CARDIAC CATHETERIZATION  Result Date: 05/05/2021 Findings: RA = 15 RV = 70/14 PA = 70/29 (46) PCW = 22 (v = 35) Fick cardiac output/index = 3.5/2.0 PVR = 7.4 WU FA sat = 97% PA sat = 42%, 44% PAPi = 2.7 Assessment: 1. 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 Prominent v- waves in PCW tracing suggestive of significant MR vs severe diastolic dysfunction (may need TEE to further evlauate) 3. MV leaflet excursion appears normal on fluoro Plan/Discussion: Will admit for IV diuresis and milrinone tune-up. Not good candidate for home milrinone given mechanical MVR and risk for bacteremia/endocarditis. Glori Bickers, MD 8:17 AM  US RENAL  Result Date: 05/11/2021 CLINICAL DATA:  Hematuria. EXAM: RENAL / URINARY TRACT ULTRASOUND COMPLETE COMPARISON:  None. FINDINGS: Right Kidney: Renal measurements: 10.0 x 8.1 x 4.2 cm = volume: 111 mL. Increased echogenicity of renal parenchyma is noted suggesting medical renal disease. Several hyperechoic foci are noted without shadowing. No mass or hydronephrosis visualized. Left Kidney: Renal measurements: 9.7 x 5.2 x 3.9 cm = volume: 104 mL. Two simple cysts are noted, the largest measuring 8 mm. Increased echogenicity of renal parenchyma is noted. No mass or hydronephrosis visualized. Bladder: Appears normal for degree of bladder distention. Other: Probable prostatic calcification is noted. IMPRESSION: Increased echogenicity of renal parenchyma is noted bilaterally suggesting medical renal disease. Possible nonobstructive right  nephrolithiasis. Electronically Signed   By: Marijo Conception M.D.   On: 05/11/2021 14:36   ECHOCARDIOGRAM COMPLETE  Result Date: 05/05/2021    ECHOCARDIOGRAM REPORT   Patient Name:   [name] Date of Exam: 05/05/2021 Medical Rec #:  [id number]       Height:       73.0 in Accession #:    [id number]      Weight:       119.0 lb Date of Birth:  06-Aug-1950       BSA:          1.726 m Patient Age:    14 years        BP:           115/42 mmHg Patient Gender: M               HR:           79 bpm. Exam Location:  Inpatient Procedure: 2D Echo Indications:    CHF  History:        Patient has prior history of Echocardiogram examinations, most                 recent 09/30/2020.                  Mitral Valve: mechanical valve valve is present in the mitral                 position.  Sonographer:    Jefferey Pica Referring Phys: [phone] LINDSAY NICOLE FINCH IMPRESSIONS  1. Left ventricular ejection fraction, by estimation, is 50 to 55%. The left ventricle has low normal function. The left ventricle demonstrates global hypokinesis. There is moderate left ventricular hypertrophy. Left ventricular diastolic parameters are  indeterminate.  2. Right ventricular systolic function is severely reduced. The right ventricular size is moderately enlarged. There is moderately elevated pulmonary artery systolic pressure. The estimated right ventricular systolic pressure is 06.2 mmHg.  3. Left atrial size was severely dilated.  4. Right atrial size was moderately dilated.  5. The mitral valve has been repaired/replaced. No evidence of mitral valve regurgitation. No evidence of mitral stenosis. The mean mitral valve gradient is 6.0 mmHg. There is a mechanical valve present in the mitral position. Echo findings are consistent with normal structure and function of the mitral valve prosthesis.  6. The aortic valve is tricuspid. There is moderate calcification of the aortic valve. There is moderate thickening of the aortic valve. Aortic  valve regurgitation is moderate. Aortic valve sclerosis/calcification is present, without any evidence of aortic stenosis.  7. Aortic dilatation noted. There is moderate dilatation of the ascending aorta, measuring 48 mm. There is moderate dilatation of the aortic root, measuring 47 mm.  8. The inferior vena cava is normal in size with greater than 50% respiratory variability, suggesting right atrial pressure of 3 mmHg. FINDINGS  Left Ventricle: Left ventricular ejection fraction, by estimation, is 50 to 55%. The left ventricle has low normal function. The left ventricle demonstrates global hypokinesis. The left ventricular internal cavity size was normal in size. There is moderate left ventricular hypertrophy. Left ventricular diastolic parameters are indeterminate. Right Ventricle: The right ventricular size is moderately enlarged. No increase in right ventricular wall thickness. Right ventricular systolic function is severely reduced. There is moderately elevated pulmonary artery systolic pressure. The tricuspid regurgitant velocity is 3.32 m/s, and with an assumed right atrial pressure of 15 mmHg, the estimated right ventricular systolic pressure is 93.9 mmHg. Left Atrium: Left atrial size was severely dilated. Right Atrium: Right atrial size was moderately dilated. Pericardium: There is no evidence of pericardial effusion. Mitral Valve: The mitral valve has been repaired/replaced. No evidence of mitral valve regurgitation. There is a mechanical valve present in the mitral position. Echo findings are consistent with normal structure and function of the mitral valve prosthesis. No evidence of mitral valve stenosis. MV peak gradient, 18.5 mmHg. The mean mitral valve gradient is 6.0 mmHg. Tricuspid Valve: The tricuspid valve is normal in structure. Tricuspid valve regurgitation is mild . No evidence of tricuspid stenosis. Aortic Valve: The aortic valve is tricuspid. There is moderate calcification of the aortic  valve. There is moderate thickening of the aortic valve. Aortic valve regurgitation is moderate. Aortic valve sclerosis/calcification is present, without any evidence of aortic stenosis. Aortic valve peak gradient measures 2.8 mmHg. Pulmonic Valve: The pulmonic valve was normal in structure. Pulmonic valve regurgitation is not visualized. No evidence of pulmonic stenosis. Aorta: Aortic dilatation noted. There is moderate dilatation of the ascending aorta, measuring 48 mm. There is moderate dilatation of the aortic root, measuring 47 mm. Venous: The inferior vena cava is normal in size with greater than 50% respiratory variability, suggesting right atrial pressure of 3 mmHg. IAS/Shunts: No atrial level shunt detected by color flow Doppler.  LEFT VENTRICLE PLAX 2D LVIDd:         3.80 cm LVIDs:         2.85 cm LV PW:         1.50 cm LV IVS:        1.35 cm LVOT diam:     2.60 cm LV SV:         54 LV SV Index:   31 LVOT Area:     5.31 cm  IVC IVC diam: 2.70 cm LEFT ATRIUM            Index        RIGHT ATRIUM  Index LA diam:      5.15 cm  2.98 cm/m   RA Area:     20.40 cm LA Vol (A2C): 118.0 ml 68.38 ml/m  RA Volume:   71.20 ml  41.26 ml/m  AORTIC VALVE                 PULMONIC VALVE AV Area (Vmax): 4.50 cm     PV Vmax:       0.68 m/s AV Vmax:        83.25 cm/s   PV Peak grad:  1.9 mmHg AV Peak Grad:   2.8 mmHg LVOT Vmax:      70.50 cm/s LVOT Vmean:     32.600 cm/s LVOT VTI:       0.102 m  AORTA Ao Root diam: 4.70 cm Ao Asc diam:  4.80 cm MITRAL VALVE                TRICUSPID VALVE MV Area (PHT): 4.12 cm     TR Peak grad:   44.1 mmHg MV Area VTI:   1.22 cm     TR Vmax:        332.00 cm/s MV Peak grad:  18.5 mmHg MV Mean grad:  6.0 mmHg     SHUNTS MV Vmax:       2.15 m/s     Systemic VTI:  0.10 m MV Vmean:      103.0 cm/s   Systemic Diam: 2.60 cm MV Decel Time: 184 msec MV E velocity: 205.00 cm/s Candee Furbish MD Electronically signed by Candee Furbish MD Signature Date/Time: 05/05/2021/2:59:54 PM    Final      Labs:  CBC: Recent Labs    05/11/21 0211 05/12/21 0257 05/13/21 0300 05/15/21 0400  WBC 5.6 5.2 4.9 5.6  HGB 8.4* 8.4* 8.2* 8.8*  HCT 26.0* 25.6* 26.0* 27.6*  PLT 106* 100* 107* 102*    COAGS: Recent Labs    05/12/21 0257 05/13/21 0300 05/14/21 0204 05/15/21 0400  INR 1.9* 2.6* 2.8* 2.6*    BMP: Recent Labs    05/12/21 0257 05/13/21 0300 05/14/21 0204 05/15/21 0400  NA 132* 131* 131* 131*  K 3.4* 4.3 4.0 2.7*  CL 97* 95* 94* 93*  CO2 23 24 25  21*  GLUCOSE 90 104* 102* 100*  BUN 70* 74* 67* 68*  CALCIUM 9.3 9.0 9.4 9.3  CREATININE 2.73* 2.96* 3.08* 2.91*  GFRNONAA 25* 22* 21* 23*    LIVER FUNCTION TESTS: Recent Labs    06/16/20 0909 07/25/20 1036 04/24/21 1231 05/06/21 0645  BILITOT 3.5* 2.7* 3.3* 3.2*  AST 147* 169* 169* 155*  ALT 40 42 44 38  ALKPHOS 92 112 64 66  PROT 9.1* 9.2* 8.8* 7.8  ALBUMIN 4.4 4.2 3.7 3.3*     Assessment and Plan:  Epistaxis --not controlled with conservative measures --embolization recommended --vitals, history and labs reviewed, can likely proceed after K+ repletion  The Risks and benefits of embolization were discussed with the patient including, but not limited to bleeding, infection, vascular injury, post operative pain, or contrast induced renal failure.  This procedure involves the use of X-rays and because of the nature of the planned procedure, it is possible that we will have prolonged use of X-ray fluoroscopy.  Potential radiation risks to you include (but are not limited to) the following: - A slightly elevated risk for cancer several years later in life. This risk is typically less than 0.5% percent. This risk is low in comparison  to the normal incidence of human cancer, which is 33% for women and 50% for men according to the [place]. - Radiation induced injury can include skin redness, resembling a rash, tissue breakdown / ulcers and hair loss (which can be temporary or permanent).    The likelihood of either of these occurring depends on the difficulty of the procedure and whether you are sensitive to radiation due to previous procedures, disease, or genetic conditions.   IF your procedure requires a prolonged use of radiation, you will be notified and given written instructions for further action.  It is your responsibility to monitor the irradiated area for the 2 weeks following the procedure and to notify your physician if you are concerned that you have suffered a radiation induced injury.    All of the patient's questions were answered, patient is agreeable to proceed. Consent signed and in chart.   Thank you for this interesting consult.  I greatly enjoyed meeting [name] and look forward to participating in their care.  A copy of this report was sent to the requesting provider on this date.  Electronically Signed: Pasty Spillers, PA 05/15/2021, 7:33 AM   I spent a total of 55 Miinutes in face to face in clinical consultation, greater than 50% of which was counseling/coordinating care for epistaxis embolization

## 2021-05-15 NOTE — Progress Notes (Signed)
[place] for warfarin Indication:  mechanical mitral valve, chronic aflutter  Allergies  Allergen Reactions   Penicillins Rash    **tolerated amoxicillin Feb 2023**  Did it involve swelling of the face/tongue/throat, SOB, or low BP? No Did it involve sudden or severe rash/hives, skin peeling, or any reaction on the inside of your mouth or nose? No Did you need to seek medical attention at a hospital or doctor's office? No When did it last happen?      30 + years If all above answers are "NO", may proceed with cephalosporin use.     Patient Measurements: Height: 6\' 1"  (185.4 cm) Weight: 48.4 kg (106 lb 11.2 oz) IBW/kg (Calculated) : 79.9  Vital Signs: Temp: 98.6 F (37 C) (02/10 1405) BP: 92/51 (02/10 1422) Pulse Rate: 69 (02/10 1422)  Labs: Recent Labs    05/13/21 0300 05/14/21 0204 05/15/21 0400  HGB 8.2*  --  8.8*  HCT 26.0*  --  27.6*  PLT 107*  --  102*  LABPROT 27.6* 29.2* 27.8*  INR 2.6* 2.8* 2.6*  CREATININE 2.96* 3.08* 2.91*     Estimated Creatinine Clearance: 16.6 mL/min (A) (by C-G formula based on SCr of 2.91 mg/dL (H)).   Medical History: Past Medical History:  Diagnosis Date   Allergy    Aneurysm of ascending aorta    Atrial fibrillation (HCC)    BPH (benign prostatic hyperplasia)    Colon polyps    FHx: rheumatic heart disease    H/O diplopia    Hepatitis C    Hypertension    Hypogonadism male    Mitral valve disease    mitral valve repair/replacement x3   OSA (obstructive sleep apnea) 12/15/2020    Assessment: 71 year old male with history of mitral valve replacement x 3, currently with mechanical valve. Patient being admitted after [facility]. INR elevated initially at 3.7. No hematoma noted post cath.   Warfarin dosing prior to admit was 5mg  MWF and 2.5mg  all other days, which was adjusted for cath. Prior visit shows regimen as 2.5mg  on Sun/Thur and 5mg  all other days.   Patient with severe nose  bleeds since post-cath. ENT consulted and bilateral packing in place this am. He is receiving amoxicillin as he has tolerated this prior to dental procedures in the past while packings in place.   INR slightly down today to 2.8. Hgb 8.8, plt 102 - stable. Bilateral packing prior to going to IR for possible embolization.   Goal of Therapy:  INR goal 2.5-3.5 Monitor platelets by anticoagulation protocol: Yes   Plan:  Given ongoing bleeding and procedures will hold warfarin again tonight and follow up recs by IR/ENT  Thank you for allowing pharmacy to participate in this patient's care.  Erin Hearing PharmD., BCPS Clinical Pharmacist 05/15/2021 2:25 PM

## 2021-05-15 NOTE — Transfer of Care (Signed)
Immediate Anesthesia Transfer of Care Note  Patient: [name]  Procedure(s) Performed: IR WITH ANESTHESIA  Patient Location: PACU  Anesthesia Type:General  Level of Consciousness: drowsy and patient cooperative  Airway & Oxygen Therapy: Patient Spontanous Breathing and Patient connected to nasal cannula oxygen  Post-op Assessment: Report given to RN and Post -op Vital signs reviewed and stable  Post vital signs: Reviewed and stable  Last Vitals:  Vitals Value Taken Time  BP 160/37 05/15/21 1405  Temp    Pulse 66 05/15/21 1412  Resp 30 05/15/21 1412  SpO2 98 % 05/15/21 1412  Vitals shown include unvalidated device data.  Last Pain:  Vitals:   05/14/21 2349  TempSrc: Oral  PainSc:       Patients Stated Pain Goal: 0 (87/19/59 7471)  Complications: No notable events documented.

## 2021-05-15 NOTE — Anesthesia Procedure Notes (Signed)
Procedure Name: Intubation Date/Time: 05/15/2021 10:43 AM Performed by: Georgia Duff, CRNA Pre-anesthesia Checklist: Patient identified, Emergency Drugs available, Suction available and Patient being monitored Patient Re-evaluated:Patient Re-evaluated prior to induction Oxygen Delivery Method: Circle System Utilized Preoxygenation: Pre-oxygenation with 100% oxygen Induction Type: IV induction Ventilation: Mask ventilation without difficulty Laryngoscope Size: Miller and 3 Grade View: Grade I Tube type: Oral Tube size: 7.5 mm Number of attempts: 1 Airway Equipment and Method: Stylet and Oral airway Placement Confirmation: ETT inserted through vocal cords under direct vision, positive ETCO2 and breath sounds checked- equal and bilateral Secured at: 22 cm Tube secured with: Tape Dental Injury: Teeth and Oropharynx as per pre-operative assessment

## 2021-05-15 NOTE — Progress Notes (Signed)
PT Cancellation Note  Patient Details Name: [name] MRN: [id number] DOB: 04-29-1950   Cancelled Treatment:    Reason Eval/Treat Not Completed: Other (comment)  Checked on pt at 16:10 - he had recently returned from embolization and is still lethargic and with TR band.  Will f/u at later date. Abran Richard, PT Acute Rehab Services Pager [phone] [facility] [place] 05/15/2021, 4:12 PM

## 2021-05-15 NOTE — Procedures (Signed)
INTERVENTIONAL NEURORADIOLOGY BRIEF POSTPROCEDURE NOTE  DIAGNOSTIC CEREBRAL ANGIOGRAM AND ENDOVASCULAR EPISTAXIS EMBOLIZATION  Attending: Dr. Pedro Earls  Assistant: None.  Diagnosis: Epistaxis.  Access site: Right radial artery  Access closure: TR band  Anesthesia: GETA  Medication used: Refer to anesthesia documentation.  Complications: None.  Estimated blood loss: Negligible.  Specimen: None.  Bilateral sphenopalatine arteries and right facial artery embolization performed with polyvinyl alcohol particles (250-350 micron). Distal left facial artery artery coil embolization performed. There remains significant arterial supply to the nasal cavity via bilateral anterior ethmoid branches which cannot be safely embolized endovascularly.  The patient tolerated the procedure well without incident or complication and is in stable condition.

## 2021-05-15 NOTE — Progress Notes (Signed)
Subjective: Pt reports more oozing through his packing last night.  Objective: Vital signs in last 24 hours: Temp:  [97.4 F (36.3 C)-97.8 F (36.6 C)] 97.8 F (36.6 C) (02/10 0441) Pulse Rate:  [68-75] 75 (02/10 0441) Resp:  [18-22] 22 (02/10 0441) BP: (92-110)/(56-62) 105/57 (02/10 0441) SpO2:  [98 %-100 %] 98 % (02/10 0441) Weight:  [48.4 kg] 48.4 kg (02/10 0441)  Physical Exam: General appearance: alert and cooperative Head: Normocephalic, without obvious abnormality, atraumatic Eyes: Pupils are equal, round, reactive to light. Extraocular motion is intact.  Ears: Examination of the ears shows normal auricles and external auditory canals bilaterally.  Nose: Nasal examination shows bilateral nasal packing. No active bleeding. Face: Facial examination shows no asymmetry. Palpation of the face elicit no significant tenderness.  Mouth: Oral cavity examination shows no mucosal lacerations. No significant trismus is noted.  Neck: Palpation of the neck reveals no lymphadenopathy or mass. The trachea is midline.  Recent Labs    05/13/21 0300 05/15/21 0400  WBC 4.9 5.6  HGB 8.2* 8.8*  HCT 26.0* 27.6*  PLT 107* 102*   Recent Labs    05/14/21 0204 05/15/21 0400  NA 131* 131*  K 4.0 2.7*  CL 94* 93*  CO2 25 21*  GLUCOSE 102* 100*  BUN 67* 68*  CREATININE 3.08* 2.91*  CALCIUM 9.4 9.3    Medications: I have reviewed the patient's current medications. Scheduled:  [MAR Hold] amoxicillin  250 mg Oral Q12H   [MAR Hold] feeding supplement  237 mL Oral BID BM   [MAR Hold] furosemide  80 mg Oral Daily   [MAR Hold] midodrine  5 mg Oral TID WC   [MAR Hold] multivitamin  1 tablet Oral QHS   [MAR Hold] polyethylene glycol  17 g Oral Daily   [MAR Hold] potassium chloride  40 mEq Oral BID   [MAR Hold] sodium chloride flush  3 mL Intravenous Q12H   Continuous:  [MAR Hold] sodium chloride     potassium chloride      Assessment/Plan: Recurrent bilateral epistaxis. Bilateral  nasal packing in place. - IR embolization this morning. - Abx while packings are in place.   LOS: 10 days   Dakari Cregger W Brehanna Deveny 05/15/2021, 7:55 AM

## 2021-05-15 NOTE — Progress Notes (Signed)
Cardiology paged to make aware of pts. Critical K+ level of 2.7.

## 2021-05-15 NOTE — Sedation Documentation (Signed)
Patient transported to PACU with [facility]. Mackenzie RN at the bedside to receive patient. Report given. Right wrist with TR band in place. No drainage from site. Soft to palpation, no hematoma noted. +2 radial pulse intact.

## 2021-05-15 NOTE — Progress Notes (Signed)
Pt. With a critical K+ of 2.7. On call for Cardiology paged to make aware.

## 2021-05-15 NOTE — Progress Notes (Incomplete)
Patient unable to tolerate 65meq potassium chloride through 22 G in Left upper arm  Tolerating KCL in 18 G

## 2021-05-15 NOTE — Progress Notes (Addendum)
Advanced Heart Failure Rounding Note  PCP-Cardiologist: Sinclair Grooms, MD   Subjective:   2/2 ENT consulted for nose bleed. L nare packed.  2/3 Milrinone stopped 2/6 R nare packed.  2/7 L nare packing removed.  2/8 Given dose of metolazone.   Having recurrent b/l epistaxis. Underwent embolization by IR today.   Scr 2.91, K 2.7  BP stable 90s - low 100s  Seen post-procedure. Still drowsy. No chest pain or dyspnea.    Objective:   Weight Range: 48.4 kg Body mass index is 14.08 kg/m.   Vital Signs:   Temp:  [97.4 F (36.3 C)-98.6 F (37 C)] 98.6 F (37 C) (02/10 1405) Pulse Rate:  [68-75] 69 (02/10 1422) Resp:  [16-22] 18 (02/10 1422) BP: (92-160)/(37-62) 92/51 (02/10 1422) SpO2:  [96 %-100 %] 100 % (02/10 1422) Arterial Line BP: (92-113)/(41-42) 92/41 (02/10 1422) Weight:  [48.4 kg] 48.4 kg (02/10 0441) Last BM Date: 05/13/21  Weight change: Filed Weights   05/13/21 0612 05/14/21 0633 05/15/21 0441  Weight: 49.9 kg 49.8 kg 48.4 kg    Intake/Output:   Intake/Output Summary (Last 24 hours) at 05/15/2021 1432 Last data filed at 05/15/2021 1349 Gross per 24 hour  Intake 1040 ml  Output 1575 ml  Net -535 ml      Physical Exam  General:  Frail. No distress. Sitting up in bed. HEENT: + packing Neck: supple. JVP 10. Carotids 2+ bilat; no bruits.  Cor: PMI nondisplaced. Irregular rate & rhythm. No rubs, gallops or murmurs. Mechanical S1. Lungs: clear Abdomen: soft, nontender, nondistended. No hepatosplenomegaly. No bruits or masses. Good bowel sounds. Extremities: no cyanosis, clubbing, rash, edema, + R radial TR band Neuro: alert & orientedx3, cranial nerves grossly intact. moves all 4 extremities w/o difficulty. Affect pleasant    Telemetry   AF 70s, 5-10 PVCs/min   Labs    CBC Recent Labs    05/13/21 0300 05/15/21 0400  WBC 4.9 5.6  HGB 8.2* 8.8*  HCT 26.0* 27.6*  MCV 102.8* 102.2*  PLT 107* 937*   Basic Metabolic Panel Recent  Labs    05/14/21 0204 05/15/21 0400  NA 131* 131*  K 4.0 2.7*  CL 94* 93*  CO2 25 21*  GLUCOSE 102* 100*  BUN 67* 68*  CREATININE 3.08* 2.91*  CALCIUM 9.4 9.3   Liver Function Tests No results for input(s): AST, ALT, ALKPHOS, BILITOT, PROT, ALBUMIN in the last 72 hours.  No results for input(s): LIPASE, AMYLASE in the last 72 hours. Cardiac Enzymes No results for input(s): CKTOTAL, CKMB, CKMBINDEX, TROPONINI in the last 72 hours.  BNP: BNP (last 3 results) Recent Labs    04/24/21 1231 04/30/21 1235  BNP 1,444.9* 898.1*    ProBNP (last 3 results) Recent Labs    07/25/20 1036  PROBNP 8,332*     D-Dimer No results for input(s): DDIMER in the last 72 hours. Hemoglobin A1C No results for input(s): HGBA1C in the last 72 hours. Fasting Lipid Panel No results for input(s): CHOL, HDL, LDLCALC, TRIG, CHOLHDL, LDLDIRECT in the last 72 hours. Thyroid Function Tests No results for input(s): TSH, T4TOTAL, T3FREE, THYROIDAB in the last 72 hours.  Invalid input(s): FREET3  Other results:   Imaging    No results found.   Medications:     Scheduled Medications:  amoxicillin  250 mg Oral Q12H   [MAR Hold] feeding supplement  237 mL Oral BID BM   [MAR Hold] furosemide  80 mg Oral Daily  heparin sodium (porcine)       lidocaine       [MAR Hold] midodrine  5 mg Oral TID WC   [MAR Hold] multivitamin  1 tablet Oral QHS   nitroGLYCERIN       [MAR Hold] polyethylene glycol  17 g Oral Daily   [MAR Hold] potassium chloride  40 mEq Oral BID   potassium chloride  40 mEq Oral Q4H   [MAR Hold] sodium chloride flush  3 mL Intravenous Q12H   verapamil       verapamil        Infusions:  [MAR Hold] sodium chloride     ceFAZolin      PRN Medications: [MAR Hold] sodium chloride, [MAR Hold] acetaminophen, fentaNYL (SUBLIMAZE) injection, [MAR Hold] ondansetron (ZOFRAN) IV, [MAR Hold] sodium chloride, [MAR Hold] sodium chloride flush, [MAR Hold] traMADol, [MAR Hold]  traZODone    Patient Profile   71 y.o. 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 Admitted with acute on chronic CHF.  Assessment/Plan   1.  Acute on chronic systolic CHF with low output/valvular HF with mixed moderate to severe PH/cor pulmonale in setting of longstanding MV disease - Echo 7/20  LVEF normal. RV severely dilated and HK. RVSP 55 - RHC 8/20 with significantly elevated biventricular pressures, moderate to severe mixed PAH and low output (CI 1.96) - M-spike negative. PYP 1/21 negative for amyloid.  - Echo (6/22): newly reduced EF 35-40%, RV severely reduced, moderate AR, mod/severe PR. (Dr. Haroldine Laws felt EF 50-55%). - Lexiscan stress MPI, 08/22: Partially reversible basal to mid anterior defect consistent w/ infarct and peri-infarct ischemia EF 54% - RHC on admit with elevated filling pressures and low output. CO 2.03. PA sat 43%. Prominent v waves on PCWP tracing.  - Echo 01/31: EF 50-55%, RV severely reduced, RVSP 59 mmHg, BAE, mean gradient 6 mmHg across mitral valve prosthesis, no MR - On admit was placed on milrinone for RV support. Stopped 2/3  - Volume status ok. Continue lasix 80 mg daily. Continue metolazone twice  a week on Wed/Sat.  - Hold spiro with CKD - No Entresto/ digoxin w/ CKD. - Continue midodrine 5 TID for BP support  - Given fraility and previous sternotomy x 3, not candidate for advanced therapies. Pallaitive Care now involved. Now DNR/DNI.   2. 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   - Echo 06/22: EF 35-40%, RV severely reduced, mechanical mitral valve with trivial MR - Prominent v waves on PCWP on RHC. Mitral valve prosthesis appears okay on TTE  - Aware of need for SBE prophylaxis.  - INR  2.6 today.    3.  Chronic a flutter  - Rate controlled - Continue AC   4.  Thoracic aneurysm, followed by Dr. Cyndia Bent  - CT chest 6/20 asc ao aorta  4.4 cm.   - 48 mm on echo this admit - Not surgical candidate.   5. AKI on Stage IV CKD - Followed by nephrology. - Baseline SCr~2.4. - Scr 3>3.1>2.91 - Suspect cardiorenal - C/w midodrine to support renal perfusion   6. 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 Negative for high-grade dysplasia/carcinoma - Followed routinely by GI.  7. Pancytopenia -Chronic/stable  8. OSA - Severe, newly diagnosed.  - Had been awaiting CPAP titration. Can likely D/C now that going on hospice  10. Epistaxis  - persistent nosebleed despite conservative measures -  ENT consulted. Both nares packed by ENT - S/p embolization by IR today - On amoxicillin while he has nose packing.   11. FTT - Previously working with a nutritionist. - Nearing end-stage RV failure.  - Palliative Care consulted   12. DNR/DNI - Palliative Care Team appreciated.  - see discussion above  13. Deconditioning - Ambulate w/ PT - HH PT recommended   14. Hematuria, painless -  UA + hematuria. Urine cytology with no atypical urothelial cells.  -  Renal u/s - no mass or obstruction  -  Outpatient f/u with urology  16. Hyponatremia - Na 131 today   17. Hypokalemia - K 2.7. Aggressively supp K.  - BMET this evening  Plan to d/c home with [facility]. [facility] setting up DME.     Length of Stay: [address] Lynder Parents, PA-C  05/15/2021, 2:32 PM  Advanced Heart Failure Team Pager [phone] (M-F; 7a - 5p)  Please contact [facility] for night-coverage after hours (5p -7a ) and weekends on [email or web address]  Patient seen and examined with the above-signed Advanced Practice Provider and/or Housestaff. I personally reviewed laboratory data, imaging studies and relevant notes. I independently examined the patient and formulated the important aspects of the plan. I have edited the note to reflect any of my changes or salient points. I have personally discussed the plan with the patient and/or  family.  Underwent partial nasal cavity arterial embolization today. Nares still packed. Bleeding has resolved currently.  Says he feels terrible. Weak. Sore. Congested.   Denies orthopnea or PND. K low. Scr stable  Weight down to 106 lbs  General:  Cachetic. Weak appearing. No resp difficulty HEENT: normal + nasal packing Neck: supple. JVP to jaw + prominent v-waves Carotids 2+ bilat; no bruits. No lymphadenopathy or thryomegaly appreciated. Cor: PMI nondisplaced. Irregular rate & rhythm. Mechanical s1 Lungs: clear Abdomen: soft, nontender, nondistended. No hepatosplenomegaly. No bruits or masses. Good bowel sounds. Extremities: no cyanosis, clubbing, rash, tr edema Neuro: alert & orientedx3, cranial nerves grossly intact. moves all 4 extremities w/o difficulty. Affect pleasant  He remains very tenuous. Appreciate IR & ENT's help with refractory epistaxis. Volume status ok. INR 2.6 (Discussed dosing with PharmD personally -> will keep 2.5-3.0 with mechanical MVR).   Continue current HF regimen. Home with Hospice when able.   Glori Bickers, MD  8:25 PM

## 2021-05-16 ENCOUNTER — Other Ambulatory Visit (HOSPITAL_COMMUNITY): Payer: Self-pay | Admitting: Physician Assistant

## 2021-05-16 DIAGNOSIS — I50810 Right heart failure, unspecified: Secondary | ICD-10-CM | POA: Diagnosis not present

## 2021-05-16 LAB — BASIC METABOLIC PANEL
Anion gap: 15 (ref 5–15)
BUN: 59 mg/dL — ABNORMAL HIGH (ref 8–23)
CO2: 19 mmol/L — ABNORMAL LOW (ref 22–32)
Calcium: 9 mg/dL (ref 8.9–10.3)
Chloride: 98 mmol/L (ref 98–111)
Creatinine, Ser: 3.05 mg/dL — ABNORMAL HIGH (ref 0.61–1.24)
GFR, Estimated: 22 mL/min — ABNORMAL LOW (ref >60)
Glucose, Bld: 99 mg/dL (ref 70–99)
Potassium: 4 mmol/L (ref 3.5–5.1)
Sodium: 132 mmol/L — ABNORMAL LOW (ref 135–145)

## 2021-05-16 LAB — CBC
HCT: 27.9 % — ABNORMAL LOW (ref 39.0–52.0)
Hemoglobin: 8.6 g/dL — ABNORMAL LOW (ref 13.0–17.0)
MCH: 32.7 pg (ref 26.0–34.0)
MCHC: 30.8 g/dL (ref 30.0–36.0)
MCV: 106.1 fL — ABNORMAL HIGH (ref 80.0–100.0)
Platelets: 119 10*3/uL — ABNORMAL LOW (ref 150–400)
RBC: 2.63 MIL/uL — ABNORMAL LOW (ref 4.22–5.81)
RDW: 32.3 % — ABNORMAL HIGH (ref 11.5–15.5)
WBC: 6.8 10*3/uL (ref 4.0–10.5)
nRBC: 0 % (ref 0.0–0.2)

## 2021-05-16 LAB — PROTIME-INR
INR: 2.9 — ABNORMAL HIGH (ref 0.8–1.2)
Prothrombin Time: 30.2 seconds — ABNORMAL HIGH (ref 11.4–15.2)

## 2021-05-16 LAB — MAGNESIUM: Magnesium: 2.2 mg/dL (ref 1.7–2.4)

## 2021-05-16 MED ORDER — WARFARIN SODIUM 2.5 MG PO TABS
2.5000 mg | ORAL_TABLET | Freq: Once | ORAL | Status: AC
  Administered 2021-05-16: 2.5 mg via ORAL
  Filled 2021-05-16: qty 1

## 2021-05-16 MED ORDER — AMOXICILLIN 250 MG PO CAPS
250.0000 mg | ORAL_CAPSULE | Freq: Two times a day (BID) | ORAL | Status: DC
  Administered 2021-05-16 – 2021-05-18 (×4): 250 mg via ORAL
  Filled 2021-05-16 (×5): qty 1

## 2021-05-16 MED ORDER — WARFARIN - PHARMACIST DOSING INPATIENT
Freq: Every day | Status: DC
  Administered 2021-05-23 – 2021-05-24 (×2): 1

## 2021-05-16 NOTE — Progress Notes (Signed)
Subjective: No significant bleeding after embolization yesterday.  Objective: Vital signs in last 24 hours: Temp:  [97.5 F (36.4 C)-98.6 F (37 C)] 97.5 F (36.4 C) (02/11 0009) Pulse Rate:  [66-98] 98 (02/11 0009) Resp:  [13-22] 21 (02/11 0010) BP: (92-160)/(37-68) 103/68 (02/11 0010) SpO2:  [94 %-100 %] 96 % (02/11 0009) Arterial Line BP: (92-116)/(39-42) 116/39 (02/10 1434) Weight:  [48.4 kg] 48.4 kg (02/10 0441)  Physical Exam: General appearance: alert and cooperative Head: Normocephalic, without obvious abnormality, atraumatic Eyes: Pupils are equal, round, reactive to light. Extraocular motion is intact.  Ears: Examination of the ears shows normal auricles and external auditory canals bilaterally.  Nose: Nasal examination shows bilateral nasal packing. No active bleeding. Face: Facial examination shows no asymmetry. Palpation of the face elicit no significant tenderness.  Mouth: Oral cavity examination shows no mucosal lacerations. No significant trismus is noted.  Neck: Palpation of the neck reveals no lymphadenopathy or mass. The trachea is midline.    Recent Labs    05/13/21 0300 05/15/21 0400 05/15/21 1003 05/15/21 1219  WBC 4.9 5.6  --   --   HGB 8.2* 8.8* 9.5* 8.8*  HCT 26.0* 27.6* 28.0* 26.0*  PLT 107* 102*  --   --    Recent Labs    05/15/21 0400 05/15/21 1003 05/15/21 1219 05/15/21 1553  NA 131*   < > 135 134*  K 2.7*   < > 3.5 3.0*  CL 93*  --   --  99  CO2 21*  --   --  22  GLUCOSE 100*  --   --  94  BUN 68*  --   --  58*  CREATININE 2.91*  --   --  2.95*  CALCIUM 9.3  --   --  8.3*   < > = values in this interval not displayed.    Medications: I have reviewed the patient's current medications. Scheduled:  amoxicillin  250 mg Oral Q12H   feeding supplement  237 mL Oral BID BM   furosemide  80 mg Oral Daily   metolazone  2.5 mg Oral Once   midodrine  5 mg Oral TID WC   multivitamin  1 tablet Oral QHS   polyethylene glycol  17 g Oral  Daily   potassium chloride  40 mEq Oral BID   potassium chloride  40 mEq Oral BID   sodium chloride flush  3 mL Intravenous Q12H   Continuous:  sodium chloride      Assessment/Plan: Recurrent bilateral epistaxis. S/p embolization by IR yesterday. - The right packing is removed without difficulty. - Will remove left packing early next week. - If d/c home, pt may follow up at my office as an outpatient. - Abx while packing is in place.     LOS: 11 days   [name] [name] 05/16/2021, 12:29 AM

## 2021-05-16 NOTE — Progress Notes (Signed)
[facility] for warfarin Indication:  mechanical mitral valve, chronic aflutter  Allergies  Allergen Reactions   Penicillins Rash    **tolerated amoxicillin Feb 2023**  Did it involve swelling of the face/tongue/throat, SOB, or low BP? No Did it involve sudden or severe rash/hives, skin peeling, or any reaction on the inside of your mouth or nose? No Did you need to seek medical attention at a hospital or doctor's office? No When did it last happen?      30 + years If all above answers are "NO", may proceed with cephalosporin use.     Patient Measurements: Height: 6\' 1"  (185.4 cm) Weight: 50 kg (110 lb 4.8 oz) IBW/kg (Calculated) : 79.9  Vital Signs: Temp: 97.8 F (36.6 C) (02/11 1243) Temp Source: Oral (02/11 1243) BP: 94/63 (02/11 1243) Pulse Rate: 93 (02/11 0816)  Labs: Recent Labs    05/14/21 0204 05/14/21 0204 05/15/21 0400 05/15/21 1003 05/15/21 1219 05/15/21 1553 05/16/21 0546  HGB  --    < > 8.8* 9.5* 8.8*  --  8.6*  HCT  --    < > 27.6* 28.0* 26.0*  --  27.9*  PLT  --   --  102*  --   --   --  119*  LABPROT 29.2*  --  27.8*  --   --   --  30.2*  INR 2.8*  --  2.6*  --   --   --  2.9*  CREATININE 3.08*  --  2.91*  --   --  2.95* 3.05*   < > = values in this interval not displayed.     Estimated Creatinine Clearance: 16.4 mL/min (A) (by C-G formula based on SCr of 3.05 mg/dL (H)).   Medical History: Past Medical History:  Diagnosis Date   Allergy    Aneurysm of ascending aorta    Atrial fibrillation (HCC)    BPH (benign prostatic hyperplasia)    Colon polyps    FHx: rheumatic heart disease    H/O diplopia    Hepatitis C    Hypertension    Hypogonadism male    Mitral valve disease    mitral valve repair/replacement x3   OSA (obstructive sleep apnea) 12/15/2020    Assessment: 71 year old male with history of mitral valve replacement x 3, currently with mechanical valve. Patient being admitted after RHC. INR  elevated initially at 3.7. No hematoma noted post cath.   Warfarin dosing prior to admit was 5mg  MWF and 2.5mg  all other days, which was adjusted for cath. Prior visit shows regimen as 2.5mg  on Sun/Thur and 5mg  all other days.   Patient with severe nose bleeds since post-cath. ENT consulted and bilateral packing in place 2/10. He is receiving amoxicillin as he has tolerated this prior to dental procedures in the past while packings in place.   INR 2.9 up despite holding warfarin for 2 doses in setting of severe epistaxis. Hgb, plt low but stable. No bleeding per RN s/p embolization 2/10.   Goal of Therapy:  INR goal 2.5-3.5 Monitor platelets by anticoagulation protocol: Yes   Plan:  Warfarin 2.5 mg x1 - PTA regimen Daily INR Monitor for bleeding, new epistaxis   Thank you for allowing pharmacy to participate in this patient's care.  Erin Hearing PharmD., BCPS Clinical Pharmacist 05/16/2021 2:44 PM

## 2021-05-16 NOTE — Progress Notes (Signed)
Advanced Heart Failure Rounding Note  PCP-Cardiologist: Sinclair Grooms, MD   Subjective:   2/2 ENT consulted for nose bleed. L nare packed.  2/3 Milrinone stopped 2/6 R nare packed.  2/7 L nare packing removed.  2/8 Given dose of metolazone.  2/10 Underwent nasal cavity arterial embolization by IR today.    Feels much better today. Epistaxis resolved. R nare packing removed.   Denies SOB, orthopnea or PND   Objective:   Weight Range: 50 kg Body mass index is 14.55 kg/m.   Vital Signs:   Temp:  [97.5 F (36.4 C)-97.9 F (36.6 C)] 97.8 F (36.6 C) (02/11 1243) Pulse Rate:  [66-98] 93 (02/11 0816) Resp:  [13-22] 18 (02/11 1243) BP: (92-119)/(48-68) 94/63 (02/11 1243) SpO2:  [94 %-100 %] 96 % (02/11 1243) Arterial Line BP: (116)/(39) 116/39 (02/10 1434) Weight:  [50 kg] 50 kg (02/11 0409) Last BM Date: 05/14/21  Weight change: Filed Weights   05/14/21 0633 05/15/21 0441 05/16/21 0409  Weight: 49.8 kg 48.4 kg 50 kg    Intake/Output:   Intake/Output Summary (Last 24 hours) at 05/16/2021 1422 Last data filed at 05/16/2021 0745 Gross per 24 hour  Intake 240 ml  Output 875 ml  Net -635 ml       Physical Exam   General:  Frail. No distress. Walking around room  HEENT: normal L nare packed Neck: supple. JVP 8-9 prominent v waves Carotids 2+ bilat; no bruits. No lymphadenopathy or thryomegaly appreciated. Cor: PMI nondisplaced. Irregular rate & rhythm. Mechanical s1 Lungs: clear Abdomen: soft, nontender, nondistended. No hepatosplenomegaly. No bruits or masses. Good bowel sounds. Extremities: no cyanosis, clubbing, rash, edema Neuro: alert & orientedx3, cranial nerves grossly intact. moves all 4 extremities w/o difficulty. Affect pleasant     Telemetry   AF 80-90s Personally reviewed   Labs    CBC Recent Labs    05/15/21 0400 05/15/21 1003 05/15/21 1219 05/16/21 0546  WBC 5.6  --   --  6.8  HGB 8.8*   < > 8.8* 8.6*  HCT 27.6*   < > 26.0*  27.9*  MCV 102.2*  --   --  106.1*  PLT 102*  --   --  119*   < > = values in this interval not displayed.    Basic Metabolic Panel Recent Labs    05/15/21 1553 05/16/21 0546  NA 134* 132*  K 3.0* 4.0  CL 99 98  CO2 22 19*  GLUCOSE 94 99  BUN 58* 59*  CREATININE 2.95* 3.05*  CALCIUM 8.3* 9.0  MG 2.1 2.2    Liver Function Tests No results for input(s): AST, ALT, ALKPHOS, BILITOT, PROT, ALBUMIN in the last 72 hours.  No results for input(s): LIPASE, AMYLASE in the last 72 hours. Cardiac Enzymes No results for input(s): CKTOTAL, CKMB, CKMBINDEX, TROPONINI in the last 72 hours.  BNP: BNP (last 3 results) Recent Labs    04/24/21 1231 04/30/21 1235  BNP 1,444.9* 898.1*     ProBNP (last 3 results) Recent Labs    07/25/20 1036  PROBNP 8,332*      D-Dimer No results for input(s): DDIMER in the last 72 hours. Hemoglobin A1C No results for input(s): HGBA1C in the last 72 hours. Fasting Lipid Panel No results for input(s): CHOL, HDL, LDLCALC, TRIG, CHOLHDL, LDLDIRECT in the last 72 hours. Thyroid Function Tests No results for input(s): TSH, T4TOTAL, T3FREE, THYROIDAB in the last 72 hours.  Invalid input(s): FREET3  Other results:  Imaging    No results found.   Medications:     Scheduled Medications:  amoxicillin  250 mg Oral Q12H   feeding supplement  237 mL Oral BID BM   furosemide  80 mg Oral Daily   midodrine  5 mg Oral TID WC   multivitamin  1 tablet Oral QHS   polyethylene glycol  17 g Oral Daily   sodium chloride flush  3 mL Intravenous Q12H    Infusions:  sodium chloride      PRN Medications: sodium chloride, acetaminophen, ondansetron (ZOFRAN) IV, sodium chloride, sodium chloride flush, traMADol, traZODone    Patient Profile   71 y.o. 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 Admitted with acute on chronic CHF.  Assessment/Plan   1.  Acute  on chronic systolic CHF with low output/valvular HF with mixed moderate to severe PH/cor pulmonale in setting of longstanding MV disease - Echo 7/20  LVEF normal. RV severely dilated and HK. RVSP 55 - RHC 8/20 with significantly elevated biventricular pressures, moderate to severe mixed PAH and low output (CI 1.96) - M-spike negative. PYP 1/21 negative for amyloid.  - Echo (6/22): newly reduced EF 35-40%, RV severely reduced, moderate AR, mod/severe PR. (Dr. Haroldine Laws felt EF 50-55%). - Lexiscan stress MPI, 08/22: Partially reversible basal to mid anterior defect consistent w/ infarct and peri-infarct ischemia EF 54% - RHC on admit with elevated filling pressures and low output. CO 2.03. PA sat 43%. Prominent v waves on PCWP tracing.  - Echo 01/31: EF 50-55%, RV severely reduced, RVSP 59 mmHg, BAE, mean gradient 6 mmHg across mitral valve prosthesis, no MR - On admit was placed on milrinone for RV support. Stopped 2/3  - Volume status ok. Continue lasix 80 mg daily. Continue metolazone twice  a week on Wed/Sat.  - Hold spiro with CKD - No Entresto/ digoxin w/ CKD. - Continue midodrine 5 TID for BP support  - Given fraility and previous sternotomy x 3, not candidate for advanced therapies. Pallaitive Care now involved. Now DNR/DNI. - Stable today. VOlume status ok    2. 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   - Echo 06/22: EF 35-40%, RV severely reduced, mechanical mitral valve with trivial MR - Prominent v waves on PCWP on RHC. Mitral valve prosthesis appears okay on TTE  - Aware of need for SBE prophylaxis.  - INR  2.9 today. Continue warfarin per PharmD   3.  Chronic a flutter  - Rate controlled - Continue AC   4.  Thoracic aneurysm, followed by Dr. Cyndia Bent  - CT chest 6/20 asc ao aorta 4.4 cm.   - 48 mm on echo this admit - Not surgical candidate.   5. AKI on Stage IV CKD - Followed by nephrology. - Baseline SCr~2.4. - Scr 3>3.1>2.91 > 3.05 - now stable   - Suspect cardiorenal - C/w midodrine to support renal perfusion   6. 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 Negative for high-grade dysplasia/carcinoma - Followed routinely by GI.  7. Pancytopenia -Chronic/stable  8. OSA - Severe, newly diagnosed.  - Had been awaiting CPAP titration. Can likely D/C now that going on hospice  10. Epistaxis  - persistent nosebleed despite conservative measures - ENT consulted.  - S/p embolization by IR  2/10  - now resolved. R packing out.  - On amoxicillin while he has nose packing.   11. FTT -  Previously working with a nutritionist. - Nearing end-stage RV failure.  - Palliative Care consulted   12. DNR/DNI - Palliative Care Team appreciated.  - see discussion above  13. Deconditioning - Ambulate w/ PT - HH PT recommended   14. Hematuria, painless -  UA + hematuria. Urine cytology with no atypical urothelial cells.  -  Renal u/s - no mass or obstruction  -  Outpatient f/u with urology  15. Hyponatremia - Na 132 today   16. Hypokalemia - K 4.0. - BMET this evening  Plan to d/c home with [facility]. [facility] setting up DME. Hopefully home in am     Length of Stay: Fraser, MD  05/16/2021, 2:22 PM  Advanced Heart Failure Team Pager [phone] (M-F; 7a - 5p)  Please contact [facility] for night-coverage after hours (5p -7a ) and weekends on [email or web address]

## 2021-05-16 NOTE — Progress Notes (Signed)
Supervising Physician: Pedro Earls  Patient Status:  [facility] - In-pt  Chief Complaint:  Epistzxis  Subjective:  1 day s/p embolization of bilateral sphenopalatine and facial arteries for epistaxis.   Patient says he is feeling better and is having a slow return of appetite.   Right packing removed today, no re bleeding has occurred Mild headache has now improved Still feels drowsy and weak most of the time.  Declines dizziness, changes in vision, shortness of breath or chest pain.  Allergies: Penicillins  Medications: Prior to Admission medications   Medication Sig Start Date End Date Taking? Authorizing Provider  Efinaconazole 10 % SOLN Apply to toenail daily for 48 weeks 06/17/20  Yes [name], MD  ferrous sulfate 325 (65 FE) MG tablet Take 1 tablet (325 mg total) by mouth every other day. In the evening. Patient taking differently: Take 325 mg by mouth every other day. In the evening. 12/02/19  Yes Nita Sells, MD  furosemide (LASIX) 40 MG tablet Take 1.5 tablets (60 mg total) by mouth daily. Patient taking differently: Take 40 mg by mouth daily. 04/24/21  Yes Milford, Maricela Bo, FNP  hydrOXYzine (ATARAX) 10 MG tablet Take 1 tablet (10 mg total) by mouth 3 (three) times daily as needed. 03/19/21  Yes Simmons-Robinson, Makiera, MD  metolazone (ZAROXOLYN) 2.5 MG tablet TAKE AS DIRECTED BY THE HEART FAILURE CLINIC Patient taking differently: Take 2.5 mg by mouth daily as needed (Fluid). Take as directed by the heart failure clinic 11/25/20  Yes Bensimhon, Shaune Pascal, MD  Multiple Vitamins-Minerals (MULTIVITAMIN WITH MINERALS) tablet Take 1 tablet by mouth daily.   Yes [provider]  polyethylene glycol (MIRALAX) 17 g packet Take 17 g by mouth daily as needed. 08/28/19  Yes Armbruster, Carlota Raspberry, MD  potassium chloride SA (KLOR-CON M) 20 MEQ tablet Take 2 tablets (40 mEq total) by mouth daily. 03/11/21  Yes Larey Dresser, MD  sildenafil  (REVATIO) 20 MG tablet Take 20 mg by mouth daily as needed (ED). 11/13/19  Yes [provider]  Testosterone 1.62 % GEL Apply 2 Pump topically daily. 1 pump on each arm 03/05/20  Yes [provider]  warfarin (COUMADIN) 5 MG tablet Take 1/2 a tablet to 1 tablet by mouth daily as directed by the coumadin clinic. Patient taking differently: Take 2.5-5 mg by mouth See admin instructions. Take 2.5 mg on Thursday, Saturday and Sunday all the other days take 5 mg as directed by the coumadin clinic. 04/15/21  Yes Belva Crome, MD  amoxicillin (AMOXIL) 500 MG capsule TAKE 4 CAPSULES BY MOUTH 1 HOUR BEFORE DENTAL APPOINTMENT 03/06/20   Belva Crome, MD  enoxaparin (LOVENOX) 60 MG/0.6ML injection Inject 0.6 mLs (60 mg total) into the skin daily. 04/24/20   Belva Crome, MD  spironolactone (ALDACTONE) 25 MG tablet Take 1 tablet (25 mg total) by mouth daily. Patient not taking: Reported on 04/29/2021 12/01/20   Bensimhon, Shaune Pascal, MD     Vital Signs: BP 94/63 (BP Location: Left Arm)    Pulse 93    Temp 97.8 F (36.6 C) (Oral)    Resp 18    Ht _0  (1.854 m)    Wt 110 lb 4.8 oz (50 kg)    SpO2 96%    BMI 14.55 kg/m   Exam: Right radial access site is soft with good pulse.  No hematoma.  Packing remains in left nare  Imaging: IR Transcath/Emboliz  Result Date: 05/15/2021  INDICATION: Persistent epistaxis despite nasal packing. EXAM: ULTRASOUND GUIDED VASCULAR ACCESS DIAGNOSTIC CEREBRAL ANGIOGRAM ENDOVASCULAR EPISTAXIS EMBOLIZATION COMPARISON:  No prior study available for comparison. MEDICATIONS: Ancef 2 g IV. The antibiotic was administered within 1 hour of the procedure. ANESTHESIA/SEDATION: The procedure was performed under general anesthesia. CONTRAST:  120 mL of Omnipaque 300 milligram/mL. FLUOROSCOPY: Radiation Exposure Index (as provided by the fluoroscopic device): 2,694 mGy Kerma COMPLICATIONS: None immediate. TECHNIQUE: Informed written consent was obtained from the patient after  a thorough discussion of the procedural risks, benefits and alternatives. All questions were addressed. Maximal Sterile Barrier Technique was utilized including caps, mask, sterile gowns, sterile gloves, sterile drape, hand hygiene and skin antiseptic. A timeout was performed prior to the initiation of the procedure. Using the modified Seldinger technique and a micropuncture kit, access was gained to the right radial artery at the wrist and a 6 French sheath was placed. Real-time ultrasound guidance was utilized for vascular access including the acquisition of a permanent ultrasound image documenting patency of the accessed vessel. Slow intra arterial infusion of 300 mcg nitroglycerin diluted in patient's own blood was performed. No significant fluctuation in patient's blood pressure seen. Then, a right radial artery angiogram was obtained via sheath side port. Next, a 6 [place] benchmark guide catheter was navigated over a 5 [place] Berenstein 2 catheter and a 0.035 " Terumo Glidewire into the right subclavian artery under fluoroscopic guidance. Under fluoroscopy, the catheter was placed into the left common carotid artery and then advanced into the left internal carotid artery. Frontal and lateral angiograms of the head were obtained followed by magnified right left anterior oblique and lateral views of the head, centered on the face. Next, the catheter was retracted into the left common carotid artery. Frontal and lateral angiograms of the neck were obtained. The catheter was then advanced under biplane roadmap into the left external carotid artery. Frontal and lateral angiograms of the face were obtained. Endovascular embolization was then carried out as described below. After left external carotid artery branches embolization, the catheter was retracted into the right subclavian artery and then exchanged over the wire for a 6 French Simmons 2 catheter. The catheter tip was reformed in the aortic arch and then  placed into the right common carotid artery. Frontal and lateral angiograms of the neck were obtained. The catheter was then placed into the left internal carotid artery. Frontal and lateral angiograms of the head were obtained. The catheter was then retracted into the right common carotid artery and on the roadmap guidance advanced into the right external carotid artery. Frontal and lateral angiograms of the face were obtained. Endovascular embolization was then carried out as described below. FINDINGS: 1. Normal brachial artery branching pattern seen. No significant anatomical variation. The right radial artery caliber is adequate for vascular access. 2. Left internal carotid artery angiogram showed brisk contrast opacification of the bilateral ACA and left MCA vascular tree as well as left PCA vascular tree via prominent posterior communicating artery. There is also flash filling of the right MCA, basilar artery and bilateral superior cerebellar arteries. Prominent ethmoid branches from the left ophthalmic artery are noted. 3. Left common carotid artery angiogram shows mild atherosclerotic changes of the left carotid bifurcation without hemodynamically significant stenosis. 4. Left external carotid artery angiogram showed enhancement of the left nasal cavity including turbinates and septum via left sphenopalatine artery. 5. Right common carotid artery angiograms showed no significant atherosclerotic disease of the right carotid bifurcation. 6. Right internal carotid artery angiogram  showed brisk contrast opacification of the right ACA and MCA vascular tree as well as right PCA vascular tree via prominent posterior communicating artery. There is also flash filling of the left ACA and upper basilar artery. Prominent ethmoid branches from the right ophthalmic artery are noted. 7. Left external carotid artery angiograms showed enhancement of the right nasal cavity including turbinates and septum via right  sphenopalatine artery and facial artery. PROCEDURE: Left external artery branches embolization: Left external artery angiograms were obtained with magnified frontal and lateral views of the face. Then, using biplane roadmap, a Prowler select Plus microcatheter was navigated over an Aristotle 14 microguidewire into the left sphenopalatine artery. Magnified frontal and lateral angiograms were obtained via microcatheter contrast injection. No dangerous anastomosis identified. Sphenopalatine artery embolization was carried out plate polyvinyl alcohol particles (250-350 microns). Control angiograms showed satisfactory resolution of the mucosal blush. The guide catheter was noted to be distal to the origin of the facial artery and was then retracted. Frontal and lateral angiograms with magnified views of the face showed arterial supply to the nasal cavity via left facial artery. A Prowler select Plus microcatheter was then navigated over an Aristotle 14 microguidewire into the distal left facial artery. Magnified frontal and lateral angiograms were obtained via microcatheter contrast injection. Embolization was then performed using the attach tubal coils (2 mm x 6 cm target helical coil, 2 mm x 6 cm target 360 soft and 3 mm x 9 cm target XL 360 soft). Control angiograms showed adequate vessel embolization. The catheter was then placed into the left internal carotid artery. Frontal and lateral angiograms with magnified views of the head were obtained showing preserved choroidal blush. Again noted are prominent ethmoid branches from the left ophthalmic artery to the nasal cavity. Right external artery branches embolization: Right external artery angiograms were obtained with magnified frontal and lateral views of the face. Then, using biplane roadmap, a Prowler select Plus microcatheter was navigated over an Aristotle 14 microguidewire into the right sphenopalatine artery. Magnified frontal and lateral angiograms were  obtained via microcatheter contrast injection. No dangerous anastomosis identified. Sphenopalatine artery embolization was carried out plate polyvinyl alcohol particles (250-350 microns). Control angiograms showed satisfactory resolution of the mucosal blush. Then, a headway duo microcatheter was navigated over an Aristotle 14 microguidewire into the right facial artery. Magnified frontal and lateral angiograms were obtained via microcatheter contrast injection. No dangerous anastomosis identified. Right facial artery embolization was carried out plate polyvinyl alcohol particles (250-350 microns). Control angiograms showed satisfactory resolution of the mucosal blush. The catheter was then placed into the right internal carotid artery. Frontal and lateral angiograms with magnified views of the head were obtained showing preserved choroidal blush. Again noted are prominent ethmoid branches from the right ophthalmic artery to the nasal cavity. IMPRESSION: 1. Endovascular embolization of the bilateral sphenoid palatine and facial arteries, as detailed above. 2. 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 PLAN: Further plans per ENT and internal medicine. Electronically Signed   By: Pedro Earls M.D.   On: 05/15/2021 17:12   IR Angiogram Follow Up Study  Result Date: 05/15/2021 INDICATION: Persistent epistaxis despite nasal packing. EXAM: ULTRASOUND GUIDED VASCULAR ACCESS DIAGNOSTIC CEREBRAL ANGIOGRAM ENDOVASCULAR EPISTAXIS EMBOLIZATION COMPARISON:  No prior study available for comparison. MEDICATIONS: Ancef 2 g IV. The antibiotic was administered within 1 hour of the procedure. ANESTHESIA/SEDATION: The procedure was performed under general anesthesia. CONTRAST:  120 mL of  Omnipaque 300 milligram/mL. FLUOROSCOPY: Radiation Exposure Index (as provided by the fluoroscopic device): 6,073 mGy Kerma COMPLICATIONS:  None immediate. TECHNIQUE: Informed written consent was obtained from the patient after a thorough discussion of the procedural risks, benefits and alternatives. All questions were addressed. Maximal Sterile Barrier Technique was utilized including caps, mask, sterile gowns, sterile gloves, sterile drape, hand hygiene and skin antiseptic. A timeout was performed prior to the initiation of the procedure. Using the modified Seldinger technique and a micropuncture kit, access was gained to the right radial artery at the wrist and a 6 French sheath was placed. Real-time ultrasound guidance was utilized for vascular access including the acquisition of a permanent ultrasound image documenting patency of the accessed vessel. Slow intra arterial infusion of 300 mcg nitroglycerin diluted in patient's own blood was performed. No significant fluctuation in patient's blood pressure seen. Then, a right radial artery angiogram was obtained via sheath side port. Next, a 6 [place] benchmark guide catheter was navigated over a 5 [place] Berenstein 2 catheter and a 0.035 " Terumo Glidewire into the right subclavian artery under fluoroscopic guidance. Under fluoroscopy, the catheter was placed into the left common carotid artery and then advanced into the left internal carotid artery. Frontal and lateral angiograms of the head were obtained followed by magnified right left anterior oblique and lateral views of the head, centered on the face. Next, the catheter was retracted into the left common carotid artery. Frontal and lateral angiograms of the neck were obtained. The catheter was then advanced under biplane roadmap into the left external carotid artery. Frontal and lateral angiograms of the face were obtained. Endovascular embolization was then carried out as described below. After left external carotid artery branches embolization, the catheter was retracted into the right subclavian artery and then exchanged over the wire for a 6  French Simmons 2 catheter. The catheter tip was reformed in the aortic arch and then placed into the right common carotid artery. Frontal and lateral angiograms of the neck were obtained. The catheter was then placed into the left internal carotid artery. Frontal and lateral angiograms of the head were obtained. The catheter was then retracted into the right common carotid artery and on the roadmap guidance advanced into the right external carotid artery. Frontal and lateral angiograms of the face were obtained. Endovascular embolization was then carried out as described below. FINDINGS: 1. Normal brachial artery branching pattern seen. No significant anatomical variation. The right radial artery caliber is adequate for vascular access. 2. Left internal carotid artery angiogram showed brisk contrast opacification of the bilateral ACA and left MCA vascular tree as well as left PCA vascular tree via prominent posterior communicating artery. There is also flash filling of the right MCA, basilar artery and bilateral superior cerebellar arteries. Prominent ethmoid branches from the left ophthalmic artery are noted. 3. Left common carotid artery angiogram shows mild atherosclerotic changes of the left carotid bifurcation without hemodynamically significant stenosis. 4. Left external carotid artery angiogram showed enhancement of the left nasal cavity including turbinates and septum via left sphenopalatine artery. 5. Right common carotid artery angiograms showed no significant atherosclerotic disease of the right carotid bifurcation. 6. Right internal carotid artery angiogram showed brisk contrast opacification of the right ACA and MCA vascular tree as well as right PCA vascular tree via prominent posterior communicating artery. There is also flash filling of the left ACA and upper basilar artery. Prominent ethmoid branches from the right ophthalmic artery are noted. 7. Left external carotid artery angiograms  showed  enhancement of the right nasal cavity including turbinates and septum via right sphenopalatine artery and facial artery. PROCEDURE: Left external artery branches embolization: Left external artery angiograms were obtained with magnified frontal and lateral views of the face. Then, using biplane roadmap, a Prowler select Plus microcatheter was navigated over an Aristotle 14 microguidewire into the left sphenopalatine artery. Magnified frontal and lateral angiograms were obtained via microcatheter contrast injection. No dangerous anastomosis identified. Sphenopalatine artery embolization was carried out plate polyvinyl alcohol particles (250-350 microns). Control angiograms showed satisfactory resolution of the mucosal blush. The guide catheter was noted to be distal to the origin of the facial artery and was then retracted. Frontal and lateral angiograms with magnified views of the face showed arterial supply to the nasal cavity via left facial artery. A Prowler select Plus microcatheter was then navigated over an Aristotle 14 microguidewire into the distal left facial artery. Magnified frontal and lateral angiograms were obtained via microcatheter contrast injection. Embolization was then performed using the attach tubal coils (2 mm x 6 cm target helical coil, 2 mm x 6 cm target 360 soft and 3 mm x 9 cm target XL 360 soft). Control angiograms showed adequate vessel embolization. The catheter was then placed into the left internal carotid artery. Frontal and lateral angiograms with magnified views of the head were obtained showing preserved choroidal blush. Again noted are prominent ethmoid branches from the left ophthalmic artery to the nasal cavity. Right external artery branches embolization: Right external artery angiograms were obtained with magnified frontal and lateral views of the face. Then, using biplane roadmap, a Prowler select Plus microcatheter was navigated over an Aristotle 14 microguidewire into the  right sphenopalatine artery. Magnified frontal and lateral angiograms were obtained via microcatheter contrast injection. No dangerous anastomosis identified. Sphenopalatine artery embolization was carried out plate polyvinyl alcohol particles (250-350 microns). Control angiograms showed satisfactory resolution of the mucosal blush. Then, a headway duo microcatheter was navigated over an Aristotle 14 microguidewire into the right facial artery. Magnified frontal and lateral angiograms were obtained via microcatheter contrast injection. No dangerous anastomosis identified. Right facial artery embolization was carried out plate polyvinyl alcohol particles (250-350 microns). Control angiograms showed satisfactory resolution of the mucosal blush. The catheter was then placed into the right internal carotid artery. Frontal and lateral angiograms with magnified views of the head were obtained showing preserved choroidal blush. Again noted are prominent ethmoid branches from the right ophthalmic artery to the nasal cavity. IMPRESSION: 1. Endovascular embolization of the bilateral sphenoid palatine and facial arteries, as detailed above. 2. 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 PLAN: Further plans per ENT and internal medicine. Electronically Signed   By: Pedro Earls M.D.   On: 05/15/2021 17:12   IR Angiogram Follow Up Study  Result Date: 05/15/2021 INDICATION: Persistent epistaxis despite nasal packing. EXAM: ULTRASOUND GUIDED VASCULAR ACCESS DIAGNOSTIC CEREBRAL ANGIOGRAM ENDOVASCULAR EPISTAXIS EMBOLIZATION COMPARISON:  No prior study available for comparison. MEDICATIONS: Ancef 2 g IV. The antibiotic was administered within 1 hour of the procedure. ANESTHESIA/SEDATION: The procedure was performed under general anesthesia. CONTRAST:  120 mL of Omnipaque 300 milligram/mL. FLUOROSCOPY: Radiation Exposure Index  (as provided by the fluoroscopic device): 4,034 mGy Kerma COMPLICATIONS: None immediate. TECHNIQUE: Informed written consent was obtained from the patient after a thorough discussion of the procedural risks, benefits and alternatives. All questions were addressed. Maximal Sterile Barrier Technique was utilized including caps, mask,  sterile gowns, sterile gloves, sterile drape, hand hygiene and skin antiseptic. A timeout was performed prior to the initiation of the procedure. Using the modified Seldinger technique and a micropuncture kit, access was gained to the right radial artery at the wrist and a 6 French sheath was placed. Real-time ultrasound guidance was utilized for vascular access including the acquisition of a permanent ultrasound image documenting patency of the accessed vessel. Slow intra arterial infusion of 300 mcg nitroglycerin diluted in patient's own blood was performed. No significant fluctuation in patient's blood pressure seen. Then, a right radial artery angiogram was obtained via sheath side port. Next, a 6 [place] benchmark guide catheter was navigated over a 5 [place] Berenstein 2 catheter and a 0.035 " Terumo Glidewire into the right subclavian artery under fluoroscopic guidance. Under fluoroscopy, the catheter was placed into the left common carotid artery and then advanced into the left internal carotid artery. Frontal and lateral angiograms of the head were obtained followed by magnified right left anterior oblique and lateral views of the head, centered on the face. Next, the catheter was retracted into the left common carotid artery. Frontal and lateral angiograms of the neck were obtained. The catheter was then advanced under biplane roadmap into the left external carotid artery. Frontal and lateral angiograms of the face were obtained. Endovascular embolization was then carried out as described below. After left external carotid artery branches embolization, the catheter was retracted  into the right subclavian artery and then exchanged over the wire for a 6 French Simmons 2 catheter. The catheter tip was reformed in the aortic arch and then placed into the right common carotid artery. Frontal and lateral angiograms of the neck were obtained. The catheter was then placed into the left internal carotid artery. Frontal and lateral angiograms of the head were obtained. The catheter was then retracted into the right common carotid artery and on the roadmap guidance advanced into the right external carotid artery. Frontal and lateral angiograms of the face were obtained. Endovascular embolization was then carried out as described below. FINDINGS: 1. Normal brachial artery branching pattern seen. No significant anatomical variation. The right radial artery caliber is adequate for vascular access. 2. Left internal carotid artery angiogram showed brisk contrast opacification of the bilateral ACA and left MCA vascular tree as well as left PCA vascular tree via prominent posterior communicating artery. There is also flash filling of the right MCA, basilar artery and bilateral superior cerebellar arteries. Prominent ethmoid branches from the left ophthalmic artery are noted. 3. Left common carotid artery angiogram shows mild atherosclerotic changes of the left carotid bifurcation without hemodynamically significant stenosis. 4. Left external carotid artery angiogram showed enhancement of the left nasal cavity including turbinates and septum via left sphenopalatine artery. 5. Right common carotid artery angiograms showed no significant atherosclerotic disease of the right carotid bifurcation. 6. Right internal carotid artery angiogram showed brisk contrast opacification of the right ACA and MCA vascular tree as well as right PCA vascular tree via prominent posterior communicating artery. There is also flash filling of the left ACA and upper basilar artery. Prominent ethmoid branches from the right ophthalmic  artery are noted. 7. Left external carotid artery angiograms showed enhancement of the right nasal cavity including turbinates and septum via right sphenopalatine artery and facial artery. PROCEDURE: Left external artery branches embolization: Left external artery angiograms were obtained with magnified frontal and lateral views of the face. Then, using biplane roadmap, a Prowler select Plus microcatheter was navigated over an  Aristotle 14 microguidewire into the left sphenopalatine artery. Magnified frontal and lateral angiograms were obtained via microcatheter contrast injection. No dangerous anastomosis identified. Sphenopalatine artery embolization was carried out plate polyvinyl alcohol particles (250-350 microns). Control angiograms showed satisfactory resolution of the mucosal blush. The guide catheter was noted to be distal to the origin of the facial artery and was then retracted. Frontal and lateral angiograms with magnified views of the face showed arterial supply to the nasal cavity via left facial artery. A Prowler select Plus microcatheter was then navigated over an Aristotle 14 microguidewire into the distal left facial artery. Magnified frontal and lateral angiograms were obtained via microcatheter contrast injection. Embolization was then performed using the attach tubal coils (2 mm x 6 cm target helical coil, 2 mm x 6 cm target 360 soft and 3 mm x 9 cm target XL 360 soft). Control angiograms showed adequate vessel embolization. The catheter was then placed into the left internal carotid artery. Frontal and lateral angiograms with magnified views of the head were obtained showing preserved choroidal blush. Again noted are prominent ethmoid branches from the left ophthalmic artery to the nasal cavity. Right external artery branches embolization: Right external artery angiograms were obtained with magnified frontal and lateral views of the face. Then, using biplane roadmap, a Prowler select Plus  microcatheter was navigated over an Aristotle 14 microguidewire into the right sphenopalatine artery. Magnified frontal and lateral angiograms were obtained via microcatheter contrast injection. No dangerous anastomosis identified. Sphenopalatine artery embolization was carried out plate polyvinyl alcohol particles (250-350 microns). Control angiograms showed satisfactory resolution of the mucosal blush. Then, a headway duo microcatheter was navigated over an Aristotle 14 microguidewire into the right facial artery. Magnified frontal and lateral angiograms were obtained via microcatheter contrast injection. No dangerous anastomosis identified. Right facial artery embolization was carried out plate polyvinyl alcohol particles (250-350 microns). Control angiograms showed satisfactory resolution of the mucosal blush. The catheter was then placed into the right internal carotid artery. Frontal and lateral angiograms with magnified views of the head were obtained showing preserved choroidal blush. Again noted are prominent ethmoid branches from the right ophthalmic artery to the nasal cavity. IMPRESSION: 1. Endovascular embolization of the bilateral sphenoid palatine and facial arteries, as detailed above. 2. 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 PLAN: Further plans per ENT and internal medicine. Electronically Signed   By: Pedro Earls M.D.   On: 05/15/2021 17:12   IR US Guide Vasc Access Right  Result Date: 05/15/2021 INDICATION: Persistent epistaxis despite nasal packing. EXAM: ULTRASOUND GUIDED VASCULAR ACCESS DIAGNOSTIC CEREBRAL ANGIOGRAM ENDOVASCULAR EPISTAXIS EMBOLIZATION COMPARISON:  No prior study available for comparison. MEDICATIONS: Ancef 2 g IV. The antibiotic was administered within 1 hour of the procedure. ANESTHESIA/SEDATION: The procedure was performed under general anesthesia. CONTRAST:  120  mL of Omnipaque 300 milligram/mL. FLUOROSCOPY: Radiation Exposure Index (as provided by the fluoroscopic device): 8,889 mGy Kerma COMPLICATIONS: None immediate. TECHNIQUE: Informed written consent was obtained from the patient after a thorough discussion of the procedural risks, benefits and alternatives. All questions were addressed. Maximal Sterile Barrier Technique was utilized including caps, mask, sterile gowns, sterile gloves, sterile drape, hand hygiene and skin antiseptic. A timeout was performed prior to the initiation of the procedure. Using the modified Seldinger technique and a micropuncture kit, access was gained to the right radial artery at the wrist and a 6 French sheath was placed. Real-time ultrasound guidance  was utilized for vascular access including the acquisition of a permanent ultrasound image documenting patency of the accessed vessel. Slow intra arterial infusion of 300 mcg nitroglycerin diluted in patient's own blood was performed. No significant fluctuation in patient's blood pressure seen. Then, a right radial artery angiogram was obtained via sheath side port. Next, a 6 [place] benchmark guide catheter was navigated over a 5 [place] Berenstein 2 catheter and a 0.035 " Terumo Glidewire into the right subclavian artery under fluoroscopic guidance. Under fluoroscopy, the catheter was placed into the left common carotid artery and then advanced into the left internal carotid artery. Frontal and lateral angiograms of the head were obtained followed by magnified right left anterior oblique and lateral views of the head, centered on the face. Next, the catheter was retracted into the left common carotid artery. Frontal and lateral angiograms of the neck were obtained. The catheter was then advanced under biplane roadmap into the left external carotid artery. Frontal and lateral angiograms of the face were obtained. Endovascular embolization was then carried out as described below. After left  external carotid artery branches embolization, the catheter was retracted into the right subclavian artery and then exchanged over the wire for a 6 French Simmons 2 catheter. The catheter tip was reformed in the aortic arch and then placed into the right common carotid artery. Frontal and lateral angiograms of the neck were obtained. The catheter was then placed into the left internal carotid artery. Frontal and lateral angiograms of the head were obtained. The catheter was then retracted into the right common carotid artery and on the roadmap guidance advanced into the right external carotid artery. Frontal and lateral angiograms of the face were obtained. Endovascular embolization was then carried out as described below. FINDINGS: 1. Normal brachial artery branching pattern seen. No significant anatomical variation. The right radial artery caliber is adequate for vascular access. 2. Left internal carotid artery angiogram showed brisk contrast opacification of the bilateral ACA and left MCA vascular tree as well as left PCA vascular tree via prominent posterior communicating artery. There is also flash filling of the right MCA, basilar artery and bilateral superior cerebellar arteries. Prominent ethmoid branches from the left ophthalmic artery are noted. 3. Left common carotid artery angiogram shows mild atherosclerotic changes of the left carotid bifurcation without hemodynamically significant stenosis. 4. Left external carotid artery angiogram showed enhancement of the left nasal cavity including turbinates and septum via left sphenopalatine artery. 5. Right common carotid artery angiograms showed no significant atherosclerotic disease of the right carotid bifurcation. 6. Right internal carotid artery angiogram showed brisk contrast opacification of the right ACA and MCA vascular tree as well as right PCA vascular tree via prominent posterior communicating artery. There is also flash filling of the left ACA and  upper basilar artery. Prominent ethmoid branches from the right ophthalmic artery are noted. 7. Left external carotid artery angiograms showed enhancement of the right nasal cavity including turbinates and septum via right sphenopalatine artery and facial artery. PROCEDURE: Left external artery branches embolization: Left external artery angiograms were obtained with magnified frontal and lateral views of the face. Then, using biplane roadmap, a Prowler select Plus microcatheter was navigated over an Aristotle 14 microguidewire into the left sphenopalatine artery. Magnified frontal and lateral angiograms were obtained via microcatheter contrast injection. No dangerous anastomosis identified. Sphenopalatine artery embolization was carried out plate polyvinyl alcohol particles (250-350 microns). Control angiograms showed satisfactory resolution of the mucosal blush. The guide catheter was noted to be distal  to the origin of the facial artery and was then retracted. Frontal and lateral angiograms with magnified views of the face showed arterial supply to the nasal cavity via left facial artery. A Prowler select Plus microcatheter was then navigated over an Aristotle 14 microguidewire into the distal left facial artery. Magnified frontal and lateral angiograms were obtained via microcatheter contrast injection. Embolization was then performed using the attach tubal coils (2 mm x 6 cm target helical coil, 2 mm x 6 cm target 360 soft and 3 mm x 9 cm target XL 360 soft). Control angiograms showed adequate vessel embolization. The catheter was then placed into the left internal carotid artery. Frontal and lateral angiograms with magnified views of the head were obtained showing preserved choroidal blush. Again noted are prominent ethmoid branches from the left ophthalmic artery to the nasal cavity. Right external artery branches embolization: Right external artery angiograms were obtained with magnified frontal and lateral  views of the face. Then, using biplane roadmap, a Prowler select Plus microcatheter was navigated over an Aristotle 14 microguidewire into the right sphenopalatine artery. Magnified frontal and lateral angiograms were obtained via microcatheter contrast injection. No dangerous anastomosis identified. Sphenopalatine artery embolization was carried out plate polyvinyl alcohol particles (250-350 microns). Control angiograms showed satisfactory resolution of the mucosal blush. Then, a headway duo microcatheter was navigated over an Aristotle 14 microguidewire into the right facial artery. Magnified frontal and lateral angiograms were obtained via microcatheter contrast injection. No dangerous anastomosis identified. Right facial artery embolization was carried out plate polyvinyl alcohol particles (250-350 microns). Control angiograms showed satisfactory resolution of the mucosal blush. The catheter was then placed into the right internal carotid artery. Frontal and lateral angiograms with magnified views of the head were obtained showing preserved choroidal blush. Again noted are prominent ethmoid branches from the right ophthalmic artery to the nasal cavity. IMPRESSION: 1. Endovascular embolization of the bilateral sphenoid palatine and facial arteries, as detailed above. 2. 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 PLAN: Further plans per ENT and internal medicine. Electronically Signed   By: Pedro Earls M.D.   On: 05/15/2021 17:12   IR ANGIO INTRA EXTRACRAN SEL INTERNAL CAROTID BILAT MOD SED  Result Date: 05/15/2021 INDICATION: Persistent epistaxis despite nasal packing. EXAM: ULTRASOUND GUIDED VASCULAR ACCESS DIAGNOSTIC CEREBRAL ANGIOGRAM ENDOVASCULAR EPISTAXIS EMBOLIZATION COMPARISON:  No prior study available for comparison. MEDICATIONS: Ancef 2 g IV. The antibiotic was administered within 1 hour of the  procedure. ANESTHESIA/SEDATION: The procedure was performed under general anesthesia. CONTRAST:  120 mL of Omnipaque 300 milligram/mL. FLUOROSCOPY: Radiation Exposure Index (as provided by the fluoroscopic device): 3,428 mGy Kerma COMPLICATIONS: None immediate. TECHNIQUE: Informed written consent was obtained from the patient after a thorough discussion of the procedural risks, benefits and alternatives. All questions were addressed. Maximal Sterile Barrier Technique was utilized including caps, mask, sterile gowns, sterile gloves, sterile drape, hand hygiene and skin antiseptic. A timeout was performed prior to the initiation of the procedure. Using the modified Seldinger technique and a micropuncture kit, access was gained to the right radial artery at the wrist and a 6 French sheath was placed. Real-time ultrasound guidance was utilized for vascular access including the acquisition of a permanent ultrasound image documenting patency of the accessed vessel. Slow intra arterial infusion of 300 mcg nitroglycerin diluted in patient's own blood was performed. No significant fluctuation in patient's blood pressure seen. Then, a right radial artery angiogram  was obtained via sheath side port. Next, a 6 [place] benchmark guide catheter was navigated over a 5 [place] Berenstein 2 catheter and a 0.035 " Terumo Glidewire into the right subclavian artery under fluoroscopic guidance. Under fluoroscopy, the catheter was placed into the left common carotid artery and then advanced into the left internal carotid artery. Frontal and lateral angiograms of the head were obtained followed by magnified right left anterior oblique and lateral views of the head, centered on the face. Next, the catheter was retracted into the left common carotid artery. Frontal and lateral angiograms of the neck were obtained. The catheter was then advanced under biplane roadmap into the left external carotid artery. Frontal and lateral angiograms of the  face were obtained. Endovascular embolization was then carried out as described below. After left external carotid artery branches embolization, the catheter was retracted into the right subclavian artery and then exchanged over the wire for a 6 French Simmons 2 catheter. The catheter tip was reformed in the aortic arch and then placed into the right common carotid artery. Frontal and lateral angiograms of the neck were obtained. The catheter was then placed into the left internal carotid artery. Frontal and lateral angiograms of the head were obtained. The catheter was then retracted into the right common carotid artery and on the roadmap guidance advanced into the right external carotid artery. Frontal and lateral angiograms of the face were obtained. Endovascular embolization was then carried out as described below. FINDINGS: 1. Normal brachial artery branching pattern seen. No significant anatomical variation. The right radial artery caliber is adequate for vascular access. 2. Left internal carotid artery angiogram showed brisk contrast opacification of the bilateral ACA and left MCA vascular tree as well as left PCA vascular tree via prominent posterior communicating artery. There is also flash filling of the right MCA, basilar artery and bilateral superior cerebellar arteries. Prominent ethmoid branches from the left ophthalmic artery are noted. 3. Left common carotid artery angiogram shows mild atherosclerotic changes of the left carotid bifurcation without hemodynamically significant stenosis. 4. Left external carotid artery angiogram showed enhancement of the left nasal cavity including turbinates and septum via left sphenopalatine artery. 5. Right common carotid artery angiograms showed no significant atherosclerotic disease of the right carotid bifurcation. 6. Right internal carotid artery angiogram showed brisk contrast opacification of the right ACA and MCA vascular tree as well as right PCA vascular  tree via prominent posterior communicating artery. There is also flash filling of the left ACA and upper basilar artery. Prominent ethmoid branches from the right ophthalmic artery are noted. 7. Left external carotid artery angiograms showed enhancement of the right nasal cavity including turbinates and septum via right sphenopalatine artery and facial artery. PROCEDURE: Left external artery branches embolization: Left external artery angiograms were obtained with magnified frontal and lateral views of the face. Then, using biplane roadmap, a Prowler select Plus microcatheter was navigated over an Aristotle 14 microguidewire into the left sphenopalatine artery. Magnified frontal and lateral angiograms were obtained via microcatheter contrast injection. No dangerous anastomosis identified. Sphenopalatine artery embolization was carried out plate polyvinyl alcohol particles (250-350 microns). Control angiograms showed satisfactory resolution of the mucosal blush. The guide catheter was noted to be distal to the origin of the facial artery and was then retracted. Frontal and lateral angiograms with magnified views of the face showed arterial supply to the nasal cavity via left facial artery. A Prowler select Plus microcatheter was then navigated over an Aristotle 14 microguidewire into the distal  left facial artery. Magnified frontal and lateral angiograms were obtained via microcatheter contrast injection. Embolization was then performed using the attach tubal coils (2 mm x 6 cm target helical coil, 2 mm x 6 cm target 360 soft and 3 mm x 9 cm target XL 360 soft). Control angiograms showed adequate vessel embolization. The catheter was then placed into the left internal carotid artery. Frontal and lateral angiograms with magnified views of the head were obtained showing preserved choroidal blush. Again noted are prominent ethmoid branches from the left ophthalmic artery to the nasal cavity. Right external artery  branches embolization: Right external artery angiograms were obtained with magnified frontal and lateral views of the face. Then, using biplane roadmap, a Prowler select Plus microcatheter was navigated over an Aristotle 14 microguidewire into the right sphenopalatine artery. Magnified frontal and lateral angiograms were obtained via microcatheter contrast injection. No dangerous anastomosis identified. Sphenopalatine artery embolization was carried out plate polyvinyl alcohol particles (250-350 microns). Control angiograms showed satisfactory resolution of the mucosal blush. Then, a headway duo microcatheter was navigated over an Aristotle 14 microguidewire into the right facial artery. Magnified frontal and lateral angiograms were obtained via microcatheter contrast injection. No dangerous anastomosis identified. Right facial artery embolization was carried out plate polyvinyl alcohol particles (250-350 microns). Control angiograms showed satisfactory resolution of the mucosal blush. The catheter was then placed into the right internal carotid artery. Frontal and lateral angiograms with magnified views of the head were obtained showing preserved choroidal blush. Again noted are prominent ethmoid branches from the right ophthalmic artery to the nasal cavity. IMPRESSION: 1. Endovascular embolization of the bilateral sphenoid palatine and facial arteries, as detailed above. 2. 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 PLAN: Further plans per ENT and internal medicine. Electronically Signed   By: Pedro Earls M.D.   On: 05/15/2021 17:12   IR ANGIO EXTERNAL CAROTID SEL EXT CAROTID BILAT MOD SED  Result Date: 05/15/2021 INDICATION: Persistent epistaxis despite nasal packing. EXAM: ULTRASOUND GUIDED VASCULAR ACCESS DIAGNOSTIC CEREBRAL ANGIOGRAM ENDOVASCULAR EPISTAXIS EMBOLIZATION COMPARISON:  No prior study  available for comparison. MEDICATIONS: Ancef 2 g IV. The antibiotic was administered within 1 hour of the procedure. ANESTHESIA/SEDATION: The procedure was performed under general anesthesia. CONTRAST:  120 mL of Omnipaque 300 milligram/mL. FLUOROSCOPY: Radiation Exposure Index (as provided by the fluoroscopic device): 9,622 mGy Kerma COMPLICATIONS: None immediate. TECHNIQUE: Informed written consent was obtained from the patient after a thorough discussion of the procedural risks, benefits and alternatives. All questions were addressed. Maximal Sterile Barrier Technique was utilized including caps, mask, sterile gowns, sterile gloves, sterile drape, hand hygiene and skin antiseptic. A timeout was performed prior to the initiation of the procedure. Using the modified Seldinger technique and a micropuncture kit, access was gained to the right radial artery at the wrist and a 6 French sheath was placed. Real-time ultrasound guidance was utilized for vascular access including the acquisition of a permanent ultrasound image documenting patency of the accessed vessel. Slow intra arterial infusion of 300 mcg nitroglycerin diluted in patient's own blood was performed. No significant fluctuation in patient's blood pressure seen. Then, a right radial artery angiogram was obtained via sheath side port. Next, a 6 [place] benchmark guide catheter was navigated over a 5 [place] Berenstein 2 catheter and a 0.035 " Terumo Glidewire into the right subclavian artery under fluoroscopic guidance. Under fluoroscopy, the catheter was placed into the left common carotid artery  and then advanced into the left internal carotid artery. Frontal and lateral angiograms of the head were obtained followed by magnified right left anterior oblique and lateral views of the head, centered on the face. Next, the catheter was retracted into the left common carotid artery. Frontal and lateral angiograms of the neck were obtained. The catheter was then  advanced under biplane roadmap into the left external carotid artery. Frontal and lateral angiograms of the face were obtained. Endovascular embolization was then carried out as described below. After left external carotid artery branches embolization, the catheter was retracted into the right subclavian artery and then exchanged over the wire for a 6 French Simmons 2 catheter. The catheter tip was reformed in the aortic arch and then placed into the right common carotid artery. Frontal and lateral angiograms of the neck were obtained. The catheter was then placed into the left internal carotid artery. Frontal and lateral angiograms of the head were obtained. The catheter was then retracted into the right common carotid artery and on the roadmap guidance advanced into the right external carotid artery. Frontal and lateral angiograms of the face were obtained. Endovascular embolization was then carried out as described below. FINDINGS: 1. Normal brachial artery branching pattern seen. No significant anatomical variation. The right radial artery caliber is adequate for vascular access. 2. Left internal carotid artery angiogram showed brisk contrast opacification of the bilateral ACA and left MCA vascular tree as well as left PCA vascular tree via prominent posterior communicating artery. There is also flash filling of the right MCA, basilar artery and bilateral superior cerebellar arteries. Prominent ethmoid branches from the left ophthalmic artery are noted. 3. Left common carotid artery angiogram shows mild atherosclerotic changes of the left carotid bifurcation without hemodynamically significant stenosis. 4. Left external carotid artery angiogram showed enhancement of the left nasal cavity including turbinates and septum via left sphenopalatine artery. 5. Right common carotid artery angiograms showed no significant atherosclerotic disease of the right carotid bifurcation. 6. Right internal carotid artery angiogram  showed brisk contrast opacification of the right ACA and MCA vascular tree as well as right PCA vascular tree via prominent posterior communicating artery. There is also flash filling of the left ACA and upper basilar artery. Prominent ethmoid branches from the right ophthalmic artery are noted. 7. Left external carotid artery angiograms showed enhancement of the right nasal cavity including turbinates and septum via right sphenopalatine artery and facial artery. PROCEDURE: Left external artery branches embolization: Left external artery angiograms were obtained with magnified frontal and lateral views of the face. Then, using biplane roadmap, a Prowler select Plus microcatheter was navigated over an Aristotle 14 microguidewire into the left sphenopalatine artery. Magnified frontal and lateral angiograms were obtained via microcatheter contrast injection. No dangerous anastomosis identified. Sphenopalatine artery embolization was carried out plate polyvinyl alcohol particles (250-350 microns). Control angiograms showed satisfactory resolution of the mucosal blush. The guide catheter was noted to be distal to the origin of the facial artery and was then retracted. Frontal and lateral angiograms with magnified views of the face showed arterial supply to the nasal cavity via left facial artery. A Prowler select Plus microcatheter was then navigated over an Aristotle 14 microguidewire into the distal left facial artery. Magnified frontal and lateral angiograms were obtained via microcatheter contrast injection. Embolization was then performed using the attach tubal coils (2 mm x 6 cm target helical coil, 2 mm x 6 cm target 360 soft and 3 mm x 9 cm target XL 360  soft). Control angiograms showed adequate vessel embolization. The catheter was then placed into the left internal carotid artery. Frontal and lateral angiograms with magnified views of the head were obtained showing preserved choroidal blush. Again noted are  prominent ethmoid branches from the left ophthalmic artery to the nasal cavity. Right external artery branches embolization: Right external artery angiograms were obtained with magnified frontal and lateral views of the face. Then, using biplane roadmap, a Prowler select Plus microcatheter was navigated over an Aristotle 14 microguidewire into the right sphenopalatine artery. Magnified frontal and lateral angiograms were obtained via microcatheter contrast injection. No dangerous anastomosis identified. Sphenopalatine artery embolization was carried out plate polyvinyl alcohol particles (250-350 microns). Control angiograms showed satisfactory resolution of the mucosal blush. Then, a headway duo microcatheter was navigated over an Aristotle 14 microguidewire into the right facial artery. Magnified frontal and lateral angiograms were obtained via microcatheter contrast injection. No dangerous anastomosis identified. Right facial artery embolization was carried out plate polyvinyl alcohol particles (250-350 microns). Control angiograms showed satisfactory resolution of the mucosal blush. The catheter was then placed into the right internal carotid artery. Frontal and lateral angiograms with magnified views of the head were obtained showing preserved choroidal blush. Again noted are prominent ethmoid branches from the right ophthalmic artery to the nasal cavity. IMPRESSION: 1. Endovascular embolization of the bilateral sphenoid palatine and facial arteries, as detailed above. 2. 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 PLAN: Further plans per ENT and internal medicine. Electronically Signed   By: Pedro Earls M.D.   On: 05/15/2021 17:12   IR NEURO EACH ADD'L AFTER BASIC UNI LEFT (MS)  Result Date: 05/15/2021 INDICATION: Persistent epistaxis despite nasal packing. EXAM: ULTRASOUND GUIDED VASCULAR ACCESS  DIAGNOSTIC CEREBRAL ANGIOGRAM ENDOVASCULAR EPISTAXIS EMBOLIZATION COMPARISON:  No prior study available for comparison. MEDICATIONS: Ancef 2 g IV. The antibiotic was administered within 1 hour of the procedure. ANESTHESIA/SEDATION: The procedure was performed under general anesthesia. CONTRAST:  120 mL of Omnipaque 300 milligram/mL. FLUOROSCOPY: Radiation Exposure Index (as provided by the fluoroscopic device): 2,355 mGy Kerma COMPLICATIONS: None immediate. TECHNIQUE: Informed written consent was obtained from the patient after a thorough discussion of the procedural risks, benefits and alternatives. All questions were addressed. Maximal Sterile Barrier Technique was utilized including caps, mask, sterile gowns, sterile gloves, sterile drape, hand hygiene and skin antiseptic. A timeout was performed prior to the initiation of the procedure. Using the modified Seldinger technique and a micropuncture kit, access was gained to the right radial artery at the wrist and a 6 French sheath was placed. Real-time ultrasound guidance was utilized for vascular access including the acquisition of a permanent ultrasound image documenting patency of the accessed vessel. Slow intra arterial infusion of 300 mcg nitroglycerin diluted in patient's own blood was performed. No significant fluctuation in patient's blood pressure seen. Then, a right radial artery angiogram was obtained via sheath side port. Next, a 6 [place] benchmark guide catheter was navigated over a 5 [place] Berenstein 2 catheter and a 0.035 " Terumo Glidewire into the right subclavian artery under fluoroscopic guidance. Under fluoroscopy, the catheter was placed into the left common carotid artery and then advanced into the left internal carotid artery. Frontal and lateral angiograms of the head were obtained followed by magnified right left anterior oblique and lateral views of the head, centered on the face. Next, the catheter was retracted into the left common  carotid artery. Frontal and  lateral angiograms of the neck were obtained. The catheter was then advanced under biplane roadmap into the left external carotid artery. Frontal and lateral angiograms of the face were obtained. Endovascular embolization was then carried out as described below. After left external carotid artery branches embolization, the catheter was retracted into the right subclavian artery and then exchanged over the wire for a 6 French Simmons 2 catheter. The catheter tip was reformed in the aortic arch and then placed into the right common carotid artery. Frontal and lateral angiograms of the neck were obtained. The catheter was then placed into the left internal carotid artery. Frontal and lateral angiograms of the head were obtained. The catheter was then retracted into the right common carotid artery and on the roadmap guidance advanced into the right external carotid artery. Frontal and lateral angiograms of the face were obtained. Endovascular embolization was then carried out as described below. FINDINGS: 1. Normal brachial artery branching pattern seen. No significant anatomical variation. The right radial artery caliber is adequate for vascular access. 2. Left internal carotid artery angiogram showed brisk contrast opacification of the bilateral ACA and left MCA vascular tree as well as left PCA vascular tree via prominent posterior communicating artery. There is also flash filling of the right MCA, basilar artery and bilateral superior cerebellar arteries. Prominent ethmoid branches from the left ophthalmic artery are noted. 3. Left common carotid artery angiogram shows mild atherosclerotic changes of the left carotid bifurcation without hemodynamically significant stenosis. 4. Left external carotid artery angiogram showed enhancement of the left nasal cavity including turbinates and septum via left sphenopalatine artery. 5. Right common carotid artery angiograms showed no significant  atherosclerotic disease of the right carotid bifurcation. 6. Right internal carotid artery angiogram showed brisk contrast opacification of the right ACA and MCA vascular tree as well as right PCA vascular tree via prominent posterior communicating artery. There is also flash filling of the left ACA and upper basilar artery. Prominent ethmoid branches from the right ophthalmic artery are noted. 7. Left external carotid artery angiograms showed enhancement of the right nasal cavity including turbinates and septum via right sphenopalatine artery and facial artery. PROCEDURE: Left external artery branches embolization: Left external artery angiograms were obtained with magnified frontal and lateral views of the face. Then, using biplane roadmap, a Prowler select Plus microcatheter was navigated over an Aristotle 14 microguidewire into the left sphenopalatine artery. Magnified frontal and lateral angiograms were obtained via microcatheter contrast injection. No dangerous anastomosis identified. Sphenopalatine artery embolization was carried out plate polyvinyl alcohol particles (250-350 microns). Control angiograms showed satisfactory resolution of the mucosal blush. The guide catheter was noted to be distal to the origin of the facial artery and was then retracted. Frontal and lateral angiograms with magnified views of the face showed arterial supply to the nasal cavity via left facial artery. A Prowler select Plus microcatheter was then navigated over an Aristotle 14 microguidewire into the distal left facial artery. Magnified frontal and lateral angiograms were obtained via microcatheter contrast injection. Embolization was then performed using the attach tubal coils (2 mm x 6 cm target helical coil, 2 mm x 6 cm target 360 soft and 3 mm x 9 cm target XL 360 soft). Control angiograms showed adequate vessel embolization. The catheter was then placed into the left internal carotid artery. Frontal and lateral angiograms  with magnified views of the head were obtained showing preserved choroidal blush. Again noted are prominent ethmoid branches from the left ophthalmic artery to the nasal  cavity. Right external artery branches embolization: Right external artery angiograms were obtained with magnified frontal and lateral views of the face. Then, using biplane roadmap, a Prowler select Plus microcatheter was navigated over an Aristotle 14 microguidewire into the right sphenopalatine artery. Magnified frontal and lateral angiograms were obtained via microcatheter contrast injection. No dangerous anastomosis identified. Sphenopalatine artery embolization was carried out plate polyvinyl alcohol particles (250-350 microns). Control angiograms showed satisfactory resolution of the mucosal blush. Then, a headway duo microcatheter was navigated over an Aristotle 14 microguidewire into the right facial artery. Magnified frontal and lateral angiograms were obtained via microcatheter contrast injection. No dangerous anastomosis identified. Right facial artery embolization was carried out plate polyvinyl alcohol particles (250-350 microns). Control angiograms showed satisfactory resolution of the mucosal blush. The catheter was then placed into the right internal carotid artery. Frontal and lateral angiograms with magnified views of the head were obtained showing preserved choroidal blush. Again noted are prominent ethmoid branches from the right ophthalmic artery to the nasal cavity. IMPRESSION: 1. Endovascular embolization of the bilateral sphenoid palatine and facial arteries, as detailed above. 2. 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 PLAN: Further plans per ENT and internal medicine. Electronically Signed   By: Pedro Earls M.D.   On: 05/15/2021 17:12   IR NEURO EACH ADD'L AFTER BASIC UNI LEFT (MS)  Result Date:  05/15/2021 INDICATION: Persistent epistaxis despite nasal packing. EXAM: ULTRASOUND GUIDED VASCULAR ACCESS DIAGNOSTIC CEREBRAL ANGIOGRAM ENDOVASCULAR EPISTAXIS EMBOLIZATION COMPARISON:  No prior study available for comparison. MEDICATIONS: Ancef 2 g IV. The antibiotic was administered within 1 hour of the procedure. ANESTHESIA/SEDATION: The procedure was performed under general anesthesia. CONTRAST:  120 mL of Omnipaque 300 milligram/mL. FLUOROSCOPY: Radiation Exposure Index (as provided by the fluoroscopic device): 5,726 mGy Kerma COMPLICATIONS: None immediate. TECHNIQUE: Informed written consent was obtained from the patient after a thorough discussion of the procedural risks, benefits and alternatives. All questions were addressed. Maximal Sterile Barrier Technique was utilized including caps, mask, sterile gowns, sterile gloves, sterile drape, hand hygiene and skin antiseptic. A timeout was performed prior to the initiation of the procedure. Using the modified Seldinger technique and a micropuncture kit, access was gained to the right radial artery at the wrist and a 6 French sheath was placed. Real-time ultrasound guidance was utilized for vascular access including the acquisition of a permanent ultrasound image documenting patency of the accessed vessel. Slow intra arterial infusion of 300 mcg nitroglycerin diluted in patient's own blood was performed. No significant fluctuation in patient's blood pressure seen. Then, a right radial artery angiogram was obtained via sheath side port. Next, a 6 [place] benchmark guide catheter was navigated over a 5 [place] Berenstein 2 catheter and a 0.035 " Terumo Glidewire into the right subclavian artery under fluoroscopic guidance. Under fluoroscopy, the catheter was placed into the left common carotid artery and then advanced into the left internal carotid artery. Frontal and lateral angiograms of the head were obtained followed by magnified right left anterior oblique and  lateral views of the head, centered on the face. Next, the catheter was retracted into the left common carotid artery. Frontal and lateral angiograms of the neck were obtained. The catheter was then advanced under biplane roadmap into the left external carotid artery. Frontal and lateral angiograms of the face were obtained. Endovascular embolization was then carried out as described below. After left external carotid artery branches embolization, the catheter was  retracted into the right subclavian artery and then exchanged over the wire for a 6 French Simmons 2 catheter. The catheter tip was reformed in the aortic arch and then placed into the right common carotid artery. Frontal and lateral angiograms of the neck were obtained. The catheter was then placed into the left internal carotid artery. Frontal and lateral angiograms of the head were obtained. The catheter was then retracted into the right common carotid artery and on the roadmap guidance advanced into the right external carotid artery. Frontal and lateral angiograms of the face were obtained. Endovascular embolization was then carried out as described below. FINDINGS: 1. Normal brachial artery branching pattern seen. No significant anatomical variation. The right radial artery caliber is adequate for vascular access. 2. Left internal carotid artery angiogram showed brisk contrast opacification of the bilateral ACA and left MCA vascular tree as well as left PCA vascular tree via prominent posterior communicating artery. There is also flash filling of the right MCA, basilar artery and bilateral superior cerebellar arteries. Prominent ethmoid branches from the left ophthalmic artery are noted. 3. Left common carotid artery angiogram shows mild atherosclerotic changes of the left carotid bifurcation without hemodynamically significant stenosis. 4. Left external carotid artery angiogram showed enhancement of the left nasal cavity including turbinates and  septum via left sphenopalatine artery. 5. Right common carotid artery angiograms showed no significant atherosclerotic disease of the right carotid bifurcation. 6. Right internal carotid artery angiogram showed brisk contrast opacification of the right ACA and MCA vascular tree as well as right PCA vascular tree via prominent posterior communicating artery. There is also flash filling of the left ACA and upper basilar artery. Prominent ethmoid branches from the right ophthalmic artery are noted. 7. Left external carotid artery angiograms showed enhancement of the right nasal cavity including turbinates and septum via right sphenopalatine artery and facial artery. PROCEDURE: Left external artery branches embolization: Left external artery angiograms were obtained with magnified frontal and lateral views of the face. Then, using biplane roadmap, a Prowler select Plus microcatheter was navigated over an Aristotle 14 microguidewire into the left sphenopalatine artery. Magnified frontal and lateral angiograms were obtained via microcatheter contrast injection. No dangerous anastomosis identified. Sphenopalatine artery embolization was carried out plate polyvinyl alcohol particles (250-350 microns). Control angiograms showed satisfactory resolution of the mucosal blush. The guide catheter was noted to be distal to the origin of the facial artery and was then retracted. Frontal and lateral angiograms with magnified views of the face showed arterial supply to the nasal cavity via left facial artery. A Prowler select Plus microcatheter was then navigated over an Aristotle 14 microguidewire into the distal left facial artery. Magnified frontal and lateral angiograms were obtained via microcatheter contrast injection. Embolization was then performed using the attach tubal coils (2 mm x 6 cm target helical coil, 2 mm x 6 cm target 360 soft and 3 mm x 9 cm target XL 360 soft). Control angiograms showed adequate vessel  embolization. The catheter was then placed into the left internal carotid artery. Frontal and lateral angiograms with magnified views of the head were obtained showing preserved choroidal blush. Again noted are prominent ethmoid branches from the left ophthalmic artery to the nasal cavity. Right external artery branches embolization: Right external artery angiograms were obtained with magnified frontal and lateral views of the face. Then, using biplane roadmap, a Prowler select Plus microcatheter was navigated over an Aristotle 14 microguidewire into the right sphenopalatine artery. Magnified frontal and lateral angiograms were obtained  via microcatheter contrast injection. No dangerous anastomosis identified. Sphenopalatine artery embolization was carried out plate polyvinyl alcohol particles (250-350 microns). Control angiograms showed satisfactory resolution of the mucosal blush. Then, a headway duo microcatheter was navigated over an Aristotle 14 microguidewire into the right facial artery. Magnified frontal and lateral angiograms were obtained via microcatheter contrast injection. No dangerous anastomosis identified. Right facial artery embolization was carried out plate polyvinyl alcohol particles (250-350 microns). Control angiograms showed satisfactory resolution of the mucosal blush. The catheter was then placed into the right internal carotid artery. Frontal and lateral angiograms with magnified views of the head were obtained showing preserved choroidal blush. Again noted are prominent ethmoid branches from the right ophthalmic artery to the nasal cavity. IMPRESSION: 1. Endovascular embolization of the bilateral sphenoid palatine and facial arteries, as detailed above. 2. 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 PLAN: Further plans per ENT and internal medicine. Electronically Signed   By: Pedro Earls M.D.   On: 05/15/2021 17:12   IR NEURO EACH ADD'L AFTER BASIC UNI RIGHT (MS)  Result Date: 05/15/2021 INDICATION: Persistent epistaxis despite nasal packing. EXAM: ULTRASOUND GUIDED VASCULAR ACCESS DIAGNOSTIC CEREBRAL ANGIOGRAM ENDOVASCULAR EPISTAXIS EMBOLIZATION COMPARISON:  No prior study available for comparison. MEDICATIONS: Ancef 2 g IV. The antibiotic was administered within 1 hour of the procedure. ANESTHESIA/SEDATION: The procedure was performed under general anesthesia. CONTRAST:  120 mL of Omnipaque 300 milligram/mL. FLUOROSCOPY: Radiation Exposure Index (as provided by the fluoroscopic device): 0,867 mGy Kerma COMPLICATIONS: None immediate. TECHNIQUE: Informed written consent was obtained from the patient after a thorough discussion of the procedural risks, benefits and alternatives. All questions were addressed. Maximal Sterile Barrier Technique was utilized including caps, mask, sterile gowns, sterile gloves, sterile drape, hand hygiene and skin antiseptic. A timeout was performed prior to the initiation of the procedure. Using the modified Seldinger technique and a micropuncture kit, access was gained to the right radial artery at the wrist and a 6 French sheath was placed. Real-time ultrasound guidance was utilized for vascular access including the acquisition of a permanent ultrasound image documenting patency of the accessed vessel. Slow intra arterial infusion of 300 mcg nitroglycerin diluted in patient's own blood was performed. No significant fluctuation in patient's blood pressure seen. Then, a right radial artery angiogram was obtained via sheath side port. Next, a 6 [place] benchmark guide catheter was navigated over a 5 [place] Berenstein 2 catheter and a 0.035 " Terumo Glidewire into the right subclavian artery under fluoroscopic guidance. Under fluoroscopy, the catheter was placed into the left common carotid artery and then advanced into the left internal carotid artery.  Frontal and lateral angiograms of the head were obtained followed by magnified right left anterior oblique and lateral views of the head, centered on the face. Next, the catheter was retracted into the left common carotid artery. Frontal and lateral angiograms of the neck were obtained. The catheter was then advanced under biplane roadmap into the left external carotid artery. Frontal and lateral angiograms of the face were obtained. Endovascular embolization was then carried out as described below. After left external carotid artery branches embolization, the catheter was retracted into the right subclavian artery and then exchanged over the wire for a 6 French Simmons 2 catheter. The catheter tip was reformed in the aortic arch and then placed into the right common carotid artery. Frontal and lateral angiograms of the neck were obtained. The catheter was  then placed into the left internal carotid artery. Frontal and lateral angiograms of the head were obtained. The catheter was then retracted into the right common carotid artery and on the roadmap guidance advanced into the right external carotid artery. Frontal and lateral angiograms of the face were obtained. Endovascular embolization was then carried out as described below. FINDINGS: 1. Normal brachial artery branching pattern seen. No significant anatomical variation. The right radial artery caliber is adequate for vascular access. 2. Left internal carotid artery angiogram showed brisk contrast opacification of the bilateral ACA and left MCA vascular tree as well as left PCA vascular tree via prominent posterior communicating artery. There is also flash filling of the right MCA, basilar artery and bilateral superior cerebellar arteries. Prominent ethmoid branches from the left ophthalmic artery are noted. 3. Left common carotid artery angiogram shows mild atherosclerotic changes of the left carotid bifurcation without hemodynamically significant stenosis. 4.  Left external carotid artery angiogram showed enhancement of the left nasal cavity including turbinates and septum via left sphenopalatine artery. 5. Right common carotid artery angiograms showed no significant atherosclerotic disease of the right carotid bifurcation. 6. Right internal carotid artery angiogram showed brisk contrast opacification of the right ACA and MCA vascular tree as well as right PCA vascular tree via prominent posterior communicating artery. There is also flash filling of the left ACA and upper basilar artery. Prominent ethmoid branches from the right ophthalmic artery are noted. 7. Left external carotid artery angiograms showed enhancement of the right nasal cavity including turbinates and septum via right sphenopalatine artery and facial artery. PROCEDURE: Left external artery branches embolization: Left external artery angiograms were obtained with magnified frontal and lateral views of the face. Then, using biplane roadmap, a Prowler select Plus microcatheter was navigated over an Aristotle 14 microguidewire into the left sphenopalatine artery. Magnified frontal and lateral angiograms were obtained via microcatheter contrast injection. No dangerous anastomosis identified. Sphenopalatine artery embolization was carried out plate polyvinyl alcohol particles (250-350 microns). Control angiograms showed satisfactory resolution of the mucosal blush. The guide catheter was noted to be distal to the origin of the facial artery and was then retracted. Frontal and lateral angiograms with magnified views of the face showed arterial supply to the nasal cavity via left facial artery. A Prowler select Plus microcatheter was then navigated over an Aristotle 14 microguidewire into the distal left facial artery. Magnified frontal and lateral angiograms were obtained via microcatheter contrast injection. Embolization was then performed using the attach tubal coils (2 mm x 6 cm target helical coil, 2 mm x 6  cm target 360 soft and 3 mm x 9 cm target XL 360 soft). Control angiograms showed adequate vessel embolization. The catheter was then placed into the left internal carotid artery. Frontal and lateral angiograms with magnified views of the head were obtained showing preserved choroidal blush. Again noted are prominent ethmoid branches from the left ophthalmic artery to the nasal cavity. Right external artery branches embolization: Right external artery angiograms were obtained with magnified frontal and lateral views of the face. Then, using biplane roadmap, a Prowler select Plus microcatheter was navigated over an Aristotle 14 microguidewire into the right sphenopalatine artery. Magnified frontal and lateral angiograms were obtained via microcatheter contrast injection. No dangerous anastomosis identified. Sphenopalatine artery embolization was carried out plate polyvinyl alcohol particles (250-350 microns). Control angiograms showed satisfactory resolution of the mucosal blush. Then, a headway duo microcatheter was navigated over an Aristotle 14 microguidewire into the right facial artery. Magnified frontal and  lateral angiograms were obtained via microcatheter contrast injection. No dangerous anastomosis identified. Right facial artery embolization was carried out plate polyvinyl alcohol particles (250-350 microns). Control angiograms showed satisfactory resolution of the mucosal blush. The catheter was then placed into the right internal carotid artery. Frontal and lateral angiograms with magnified views of the head were obtained showing preserved choroidal blush. Again noted are prominent ethmoid branches from the right ophthalmic artery to the nasal cavity. IMPRESSION: 1. Endovascular embolization of the bilateral sphenoid palatine and facial arteries, as detailed above. 2. 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 PLAN:  Further plans per ENT and internal medicine. Electronically Signed   By: Pedro Earls M.D.   On: 05/15/2021 17:12   IR NEURO EACH ADD'L AFTER BASIC UNI RIGHT (MS)  Result Date: 05/15/2021 INDICATION: Persistent epistaxis despite nasal packing. EXAM: ULTRASOUND GUIDED VASCULAR ACCESS DIAGNOSTIC CEREBRAL ANGIOGRAM ENDOVASCULAR EPISTAXIS EMBOLIZATION COMPARISON:  No prior study available for comparison. MEDICATIONS: Ancef 2 g IV. The antibiotic was administered within 1 hour of the procedure. ANESTHESIA/SEDATION: The procedure was performed under general anesthesia. CONTRAST:  120 mL of Omnipaque 300 milligram/mL. FLUOROSCOPY: Radiation Exposure Index (as provided by the fluoroscopic device): 0,175 mGy Kerma COMPLICATIONS: None immediate. TECHNIQUE: Informed written consent was obtained from the patient after a thorough discussion of the procedural risks, benefits and alternatives. All questions were addressed. Maximal Sterile Barrier Technique was utilized including caps, mask, sterile gowns, sterile gloves, sterile drape, hand hygiene and skin antiseptic. A timeout was performed prior to the initiation of the procedure. Using the modified Seldinger technique and a micropuncture kit, access was gained to the right radial artery at the wrist and a 6 French sheath was placed. Real-time ultrasound guidance was utilized for vascular access including the acquisition of a permanent ultrasound image documenting patency of the accessed vessel. Slow intra arterial infusion of 300 mcg nitroglycerin diluted in patient's own blood was performed. No significant fluctuation in patient's blood pressure seen. Then, a right radial artery angiogram was obtained via sheath side port. Next, a 6 [place] benchmark guide catheter was navigated over a 5 [place] Berenstein 2 catheter and a 0.035 " Terumo Glidewire into the right subclavian artery under fluoroscopic guidance. Under fluoroscopy, the catheter was placed into  the left common carotid artery and then advanced into the left internal carotid artery. Frontal and lateral angiograms of the head were obtained followed by magnified right left anterior oblique and lateral views of the head, centered on the face. Next, the catheter was retracted into the left common carotid artery. Frontal and lateral angiograms of the neck were obtained. The catheter was then advanced under biplane roadmap into the left external carotid artery. Frontal and lateral angiograms of the face were obtained. Endovascular embolization was then carried out as described below. After left external carotid artery branches embolization, the catheter was retracted into the right subclavian artery and then exchanged over the wire for a 6 French Simmons 2 catheter. The catheter tip was reformed in the aortic arch and then placed into the right common carotid artery. Frontal and lateral angiograms of the neck were obtained. The catheter was then placed into the left internal carotid artery. Frontal and lateral angiograms of the head were obtained. The catheter was then retracted into the right common carotid artery and on the roadmap guidance advanced into the right external carotid artery. Frontal and lateral angiograms of the face were obtained.  Endovascular embolization was then carried out as described below. FINDINGS: 1. Normal brachial artery branching pattern seen. No significant anatomical variation. The right radial artery caliber is adequate for vascular access. 2. Left internal carotid artery angiogram showed brisk contrast opacification of the bilateral ACA and left MCA vascular tree as well as left PCA vascular tree via prominent posterior communicating artery. There is also flash filling of the right MCA, basilar artery and bilateral superior cerebellar arteries. Prominent ethmoid branches from the left ophthalmic artery are noted. 3. Left common carotid artery angiogram shows mild atherosclerotic  changes of the left carotid bifurcation without hemodynamically significant stenosis. 4. Left external carotid artery angiogram showed enhancement of the left nasal cavity including turbinates and septum via left sphenopalatine artery. 5. Right common carotid artery angiograms showed no significant atherosclerotic disease of the right carotid bifurcation. 6. Right internal carotid artery angiogram showed brisk contrast opacification of the right ACA and MCA vascular tree as well as right PCA vascular tree via prominent posterior communicating artery. There is also flash filling of the left ACA and upper basilar artery. Prominent ethmoid branches from the right ophthalmic artery are noted. 7. Left external carotid artery angiograms showed enhancement of the right nasal cavity including turbinates and septum via right sphenopalatine artery and facial artery. PROCEDURE: Left external artery branches embolization: Left external artery angiograms were obtained with magnified frontal and lateral views of the face. Then, using biplane roadmap, a Prowler select Plus microcatheter was navigated over an Aristotle 14 microguidewire into the left sphenopalatine artery. Magnified frontal and lateral angiograms were obtained via microcatheter contrast injection. No dangerous anastomosis identified. Sphenopalatine artery embolization was carried out plate polyvinyl alcohol particles (250-350 microns). Control angiograms showed satisfactory resolution of the mucosal blush. The guide catheter was noted to be distal to the origin of the facial artery and was then retracted. Frontal and lateral angiograms with magnified views of the face showed arterial supply to the nasal cavity via left facial artery. A Prowler select Plus microcatheter was then navigated over an Aristotle 14 microguidewire into the distal left facial artery. Magnified frontal and lateral angiograms were obtained via microcatheter contrast injection. Embolization  was then performed using the attach tubal coils (2 mm x 6 cm target helical coil, 2 mm x 6 cm target 360 soft and 3 mm x 9 cm target XL 360 soft). Control angiograms showed adequate vessel embolization. The catheter was then placed into the left internal carotid artery. Frontal and lateral angiograms with magnified views of the head were obtained showing preserved choroidal blush. Again noted are prominent ethmoid branches from the left ophthalmic artery to the nasal cavity. Right external artery branches embolization: Right external artery angiograms were obtained with magnified frontal and lateral views of the face. Then, using biplane roadmap, a Prowler select Plus microcatheter was navigated over an Aristotle 14 microguidewire into the right sphenopalatine artery. Magnified frontal and lateral angiograms were obtained via microcatheter contrast injection. No dangerous anastomosis identified. Sphenopalatine artery embolization was carried out plate polyvinyl alcohol particles (250-350 microns). Control angiograms showed satisfactory resolution of the mucosal blush. Then, a headway duo microcatheter was navigated over an Aristotle 14 microguidewire into the right facial artery. Magnified frontal and lateral angiograms were obtained via microcatheter contrast injection. No dangerous anastomosis identified. Right facial artery embolization was carried out plate polyvinyl alcohol particles (250-350 microns). Control angiograms showed satisfactory resolution of the mucosal blush. The catheter was then placed into the right internal carotid artery. Frontal and lateral angiograms  with magnified views of the head were obtained showing preserved choroidal blush. Again noted are prominent ethmoid branches from the right ophthalmic artery to the nasal cavity. IMPRESSION: 1. Endovascular embolization of the bilateral sphenoid palatine and facial arteries, as detailed above. 2. Persistent arterial supply to the superior  aspect of the nasal cavity via ethmoidal branches of the bilateral ophthalmic arteries. PLAN: Further plans per ENT and internal medicine. Electronically Signed   By: Pedro Earls M.D.   On: 05/15/2021 17:12    Labs:  CBC: Recent Labs    05/12/21 0257 05/13/21 0300 05/15/21 0400 05/15/21 1003 05/15/21 1219 05/16/21 0546  WBC 5.2 4.9 5.6  --   --  6.8  HGB 8.4* 8.2* 8.8* 9.5* 8.8* 8.6*  HCT 25.6* 26.0* 27.6* 28.0* 26.0* 27.9*  PLT 100* 107* 102*  --   --  119*    COAGS: Recent Labs    05/13/21 0300 05/14/21 0204 05/15/21 0400 05/16/21 0546  INR 2.6* 2.8* 2.6* 2.9*    BMP: Recent Labs    05/14/21 0204 05/15/21 0400 05/15/21 1003 05/15/21 1219 05/15/21 1553 05/16/21 0546  NA 131* 131* 137 135 134* 132*  K 4.0 2.7* 2.9* 3.5 3.0* 4.0  CL 94* 93*  --   --  99 98  CO2 25 21*  --   --  22 19*  GLUCOSE 102* 100*  --   --  94 99  BUN 67* 68*  --   --  58* 59*  CALCIUM 9.4 9.3  --   --  8.3* 9.0  CREATININE 3.08* 2.91*  --   --  2.95* 3.05*  GFRNONAA 21* 23*  --   --  22* 22*    LIVER FUNCTION TESTS: Recent Labs    06/16/20 0909 07/25/20 1036 04/24/21 1231 05/06/21 0645  BILITOT 3.5* 2.7* 3.3* 3.2*  AST 147* 169* 169* 155*  ALT 40 42 44 38  ALKPHOS 92 112 64 66  PROT 9.1* 9.2* 8.8* 7.8  ALBUMIN 4.4 4.2 3.7 3.3*    Assessment and Plan:  Recurrent epistaxis -1 day s/p embolization without rebleeding.   -no apparent complication -continue dressing changes at puncture site as needed, do not submerge right wrist for 7 days. -NIR to sign off, and is available for reconsult as needed.   Electronically Signed: Pasty Spillers, PA 05/16/2021, 2:20 PM   I spent a total of 15 Minutes at the the patient's bedside AND on the patient's hospital floor or unit, greater than 50% of which was counseling/coordinating care for epistaxis

## 2021-05-16 NOTE — Anesthesia Postprocedure Evaluation (Signed)
Anesthesia Post Note  Patient: [name]  Procedure(s) Performed: IR WITH ANESTHESIA     Patient location during evaluation: PACU Anesthesia Type: General Level of consciousness: sedated Pain management: pain level controlled Vital Signs Assessment: post-procedure vital signs reviewed and stable Respiratory status: spontaneous breathing and respiratory function stable Cardiovascular status: stable Postop Assessment: no apparent nausea or vomiting Anesthetic complications: no   No notable events documented.                 July Nickson DANIEL

## 2021-05-17 DIAGNOSIS — I50810 Right heart failure, unspecified: Secondary | ICD-10-CM | POA: Diagnosis not present

## 2021-05-17 LAB — BASIC METABOLIC PANEL
Anion gap: 13 (ref 5–15)
BUN: 65 mg/dL — ABNORMAL HIGH (ref 8–23)
CO2: 21 mmol/L — ABNORMAL LOW (ref 22–32)
Calcium: 9.2 mg/dL (ref 8.9–10.3)
Chloride: 97 mmol/L — ABNORMAL LOW (ref 98–111)
Creatinine, Ser: 3.17 mg/dL — ABNORMAL HIGH (ref 0.61–1.24)
GFR, Estimated: 21 mL/min — ABNORMAL LOW (ref >60)
Glucose, Bld: 118 mg/dL — ABNORMAL HIGH (ref 70–99)
Potassium: 4 mmol/L (ref 3.5–5.1)
Sodium: 131 mmol/L — ABNORMAL LOW (ref 135–145)

## 2021-05-17 LAB — CBC
HCT: 25.2 % — ABNORMAL LOW (ref 39.0–52.0)
Hemoglobin: 8.1 g/dL — ABNORMAL LOW (ref 13.0–17.0)
MCH: 33.3 pg (ref 26.0–34.0)
MCHC: 32.1 g/dL (ref 30.0–36.0)
MCV: 103.7 fL — ABNORMAL HIGH (ref 80.0–100.0)
Platelets: 115 10*3/uL — ABNORMAL LOW (ref 150–400)
RBC: 2.43 MIL/uL — ABNORMAL LOW (ref 4.22–5.81)
RDW: 31 % — ABNORMAL HIGH (ref 11.5–15.5)
WBC: 6.3 10*3/uL (ref 4.0–10.5)
nRBC: 0.3 % — ABNORMAL HIGH (ref 0.0–0.2)

## 2021-05-17 LAB — PROTIME-INR
INR: 3.4 — ABNORMAL HIGH (ref 0.8–1.2)
Prothrombin Time: 34.7 seconds — ABNORMAL HIGH (ref 11.4–15.2)

## 2021-05-17 MED ORDER — WARFARIN SODIUM 1 MG PO TABS
1.0000 mg | ORAL_TABLET | Freq: Once | ORAL | Status: AC
Start: 2021-05-17 — End: 2021-05-17
  Administered 2021-05-17: 1 mg via ORAL
  Filled 2021-05-17: qty 1

## 2021-05-17 NOTE — Progress Notes (Signed)
ANTICOAGULATION CONSULT NOTE - Follow Up Consult  Pharmacy Consult for warfarin Indication: mechanical mitral valve + chronic aflutter  Allergies  Allergen Reactions   Penicillins Rash    **tolerated amoxicillin Feb 2023**  Did it involve swelling of the face/tongue/throat, SOB, or low BP? No Did it involve sudden or severe rash/hives, skin peeling, or any reaction on the inside of your mouth or nose? No Did you need to seek medical attention at a hospital or doctor's office? No When did it last happen?      30 + years If all above answers are "NO", may proceed with cephalosporin use.     Patient Measurements: Height: 6\' 1"  (185.4 cm) Weight: 49.9 kg (110 lb 1.6 oz) IBW/kg (Calculated) : 79.9  Vital Signs: Temp: 97.6 F (36.4 C) (02/12 0741) Temp Source: Oral (02/12 0741) BP: 110/71 (02/12 0800) Pulse Rate: 108 (02/12 0800)  Labs: Recent Labs    05/15/21 0400 05/15/21 1003 05/15/21 1219 05/15/21 1553 05/16/21 0546 05/17/21 0406  HGB 8.8*   < > 8.8*  --  8.6* 8.1*  HCT 27.6*   < > 26.0*  --  27.9* 25.2*  PLT 102*  --   --   --  119* 115*  LABPROT 27.8*  --   --   --  30.2* 34.7*  INR 2.6*  --   --   --  2.9* 3.4*  CREATININE 2.91*  --   --  2.95* 3.05* 3.17*   < > = values in this interval not displayed.    Estimated Creatinine Clearance: 15.7 mL/min (A) (by C-G formula based on SCr of 3.17 mg/dL (H)).  Assessment: 71 yo M with a history of mitral valve replacement x 3, currently with mechanical valve. Patient being admitted after RHC. INR elevated initially at 3.7. No hematoma noted post cath.   Warfarin dosing prior to admit was reportedly 5 mg MWF and 2.5 mg all other days, which was adjusted for cath. However, prior visit shows regimen of 2.5 mg Sun/Thurs and 5 mg all other days.  Patient had severe nose bleeds after cath. ENT consulted and bilateral packing was placed on 2/10. Now, left packing has been removed and right packing is still in place per RN. RN  also reports occasional dripping from left nare, but no continued bleeding and it is much improved.   Warfarin was held 2/9 and 2/10 for severe epistaxis. Received 2.5 mg yesterday and INR increased from 2.9 to 3.4 despite previous hold. Will maintain conservative dosing today for INR at the high end of goal and for INR increase despite holding warfarin. CBC continues to be stable.   Goal of Therapy:  INR 2.5-3.5 Monitor platelets by anticoagulation protocol: Yes   Plan:  Warfarin 1 mg x1 today Daily INR, CBC Monitor for bleeding, new/increased epistaxis   Thank you for including pharmacy in the care of this patient.  Zenaida Deed, PharmD PGY1 Acute Care Pharmacy Resident  Phone: [phone] 05/17/2021  8:45 AM  Please check [email or web address] for unit-specific pharmacy phone numbers.

## 2021-05-17 NOTE — Plan of Care (Signed)
  Problem: Clinical Measurements: Goal: Diagnostic test results will improve Outcome: Progressing Goal: Respiratory complications will improve Outcome: Progressing   

## 2021-05-17 NOTE — Progress Notes (Addendum)
Advanced Heart Failure Rounding Note  PCP-Cardiologist: Sinclair Grooms, MD   Subjective:   2/2 ENT consulted for nose bleed. L nare packed.  2/3 Milrinone stopped 2/6 R nare packed.  2/7 L nare packing removed.  2/8 Given dose of metolazone.  2/10 Underwent nasal cavity arterial embolization by IR today.   Feels ok. Denies SOB,orthopnea or PND.   Epistaxis has not recurred. L nare remains packed  Scr 2.95 -> 3.05 -> 3.17    Objective:   Weight Range: 49.9 kg Body mass index is 14.53 kg/m.   Vital Signs:   Temp:  [97.4 F (36.3 C)-97.8 F (36.6 C)] 97.6 F (36.4 C) (02/12 0741) Pulse Rate:  [90-108] 108 (02/12 0800) Resp:  [17-22] 18 (02/12 0800) BP: (94-112)/(60-71) 112/66 (02/12 0900) SpO2:  [93 %-100 %] 100 % (02/12 0400) Weight:  [49.9 kg] 49.9 kg (02/12 0646) Last BM Date: 05/14/21  Weight change: Filed Weights   05/15/21 0441 05/16/21 0409 05/17/21 0646  Weight: 48.4 kg 50 kg 49.9 kg    Intake/Output:   Intake/Output Summary (Last 24 hours) at 05/17/2021 0930 Last data filed at 05/17/2021 0835 Gross per 24 hour  Intake 480 ml  Output 825 ml  Net -345 ml       Physical Exam   General:  Sitting up in bed. NAD HEENT: normal + L nare packed Neck: supple. JVP 7-8 + prominent v waves  Carotids 2+ bilat; no bruits. No lymphadenopathy or thryomegaly appreciated. Cor: PMI nondisplaced. Irregular rate & rhythm. Mechanical s1 Lungs: clear Abdomen: soft, nontender, nondistended. No hepatosplenomegaly. No bruits or masses. Good bowel sounds. Extremities: no cyanosis, clubbing, rash, edema Neuro: alert & orientedx3, cranial nerves grossly intact. moves all 4 extremities w/o difficulty. Affect pleasant    Telemetry   AF 80-100 Personally reviewed   Labs    CBC Recent Labs    05/16/21 0546 05/17/21 0406  WBC 6.8 6.3  HGB 8.6* 8.1*  HCT 27.9* 25.2*  MCV 106.1* 103.7*  PLT 119* 115*    Basic Metabolic Panel Recent Labs     05/15/21 1553 05/16/21 0546 05/17/21 0406  NA 134* 132* 131*  K 3.0* 4.0 4.0  CL 99 98 97*  CO2 22 19* 21*  GLUCOSE 94 99 118*  BUN 58* 59* 65*  CREATININE 2.95* 3.05* 3.17*  CALCIUM 8.3* 9.0 9.2  MG 2.1 2.2  --     Liver Function Tests No results for input(s): AST, ALT, ALKPHOS, BILITOT, PROT, ALBUMIN in the last 72 hours.  No results for input(s): LIPASE, AMYLASE in the last 72 hours. Cardiac Enzymes No results for input(s): CKTOTAL, CKMB, CKMBINDEX, TROPONINI in the last 72 hours.  BNP: BNP (last 3 results) Recent Labs    04/24/21 1231 04/30/21 1235  BNP 1,444.9* 898.1*     ProBNP (last 3 results) Recent Labs    07/25/20 1036  PROBNP 8,332*      D-Dimer No results for input(s): DDIMER in the last 72 hours. Hemoglobin A1C No results for input(s): HGBA1C in the last 72 hours. Fasting Lipid Panel No results for input(s): CHOL, HDL, LDLCALC, TRIG, CHOLHDL, LDLDIRECT in the last 72 hours. Thyroid Function Tests No results for input(s): TSH, T4TOTAL, T3FREE, THYROIDAB in the last 72 hours.  Invalid input(s): FREET3  Other results:   Imaging    No results found.   Medications:     Scheduled Medications:  amoxicillin  250 mg Oral Q12H   feeding supplement  237  mL Oral BID BM   midodrine  5 mg Oral TID WC   multivitamin  1 tablet Oral QHS   polyethylene glycol  17 g Oral Daily   sodium chloride flush  3 mL Intravenous Q12H   warfarin  1 mg Oral ONCE-1600   Warfarin - Pharmacist Dosing Inpatient   Does not apply q1600    Infusions:  sodium chloride      PRN Medications: sodium chloride, acetaminophen, ondansetron (ZOFRAN) IV, sodium chloride, sodium chloride flush, traMADol, traZODone    Patient Profile   71 y.o. 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 Admitted with acute on chronic CHF.  Assessment/Plan   1.  Acute on chronic systolic CHF with low  output/valvular HF with mixed moderate to severe PH/cor pulmonale in setting of longstanding MV disease - Echo 7/20  LVEF normal. RV severely dilated and HK. RVSP 55 - RHC 8/20 with significantly elevated biventricular pressures, moderate to severe mixed PAH and low output (CI 1.96) - M-spike negative. PYP 1/21 negative for amyloid.  - Echo (6/22): newly reduced EF 35-40%, RV severely reduced, moderate AR, mod/severe PR. (Dr. Haroldine Laws felt EF 50-55%). - Lexiscan stress MPI, 08/22: Partially reversible basal to mid anterior defect consistent w/ infarct and peri-infarct ischemia EF 54% - RHC on admit with elevated filling pressures and low output. CO 2.03. PA sat 43%. Prominent v waves on PCWP tracing.  - Echo 01/31: EF 50-55%, RV severely reduced, RVSP 59 mmHg, BAE, mean gradient 6 mmHg across mitral valve prosthesis, no MR - On admit was placed on milrinone for RV support. Stopped 2/3  - Volume status ok on lasix 80 mg daily and metolazone twice  a week on Wed/Sat.  - Hold spiro with CKD - No Entresto/ digoxin w/ CKD. - Continue midodrine 5 TID for BP support  - Given fraility and previous sternotomy x 3, not candidate for advanced therapies. Pallaitive Care now involved. Now DNR/DNI. - HF stable. SCr rising possible due to post-procedure CIN (received 120cc contrast). Hold diuretics. Watch Scr   2. 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   - Echo 06/22: EF 35-40%, RV severely reduced, mechanical mitral valve with trivial MR - Prominent v waves on PCWP on RHC. Mitral valve prosthesis appears okay on TTE  - Aware of need for SBE prophylaxis.  - INR  3.4 today. Continue warfarin per PharmD Will get 1mg  today   3.  Chronic a flutter  - Rate controlled - Continue AC   4.  Thoracic aneurysm, followed by Dr. Cyndia Bent  - CT chest 6/20 asc ao aorta 4.4 cm.   - 48 mm on echo this admit - Not surgical candidate.   5. AKI on Stage IV CKD - Followed by nephrology. -  Baseline SCr~2.4.l - C/w midodrine to support renal perfusion - SCr rising possible due to post-procedure CIN (received 120cc contrast). Hold diuretics. Watch Scr   6. 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 Negative for high-grade dysplasia/carcinoma - Followed routinely by GI.  7. Pancytopenia -Chronic/stable  8. OSA - Severe, newly diagnosed.  - Had been awaiting CPAP titration. Can likely D/C now that going on hospice  10. Epistaxis  - persistent nosebleed despite conservative measures - ENT consulted.  - S/p embolization by IR  2/10  - now resolved R packing out. L packing remains - On amoxicillin while he has nose packing.  11. FTT - Previously working with a nutritionist. - Nearing end-stage RV failure.  - Palliative Care consulted   12. DNR/DNI - Palliative Care Team appreciated.  - see discussion above  13. Deconditioning - Ambulate w/ PT - HH PT recommended   14. Hematuria, painless -  UA + hematuria. Urine cytology with no atypical urothelial cells.  -  Renal u/s - no mass or obstruction  -  Outpatient f/u with urology  15. Hyponatremia - Na 131 today   16. Hypokalemia - K 4.0. - BMET this evening  Plan to d/c home with [facility]. [facility] setting up DME. Home when renal function stabilizes post procedure.     Length of Stay: [place], MD  05/17/2021, 9:30 AM  Advanced Heart Failure Team Pager [phone] (M-F; 7a - 5p)  Please contact [facility] for night-coverage after hours (5p -7a ) and weekends on [email or web address]

## 2021-05-17 NOTE — Progress Notes (Signed)
Subjective: No bleeding yesterday.  Objective: Vital signs in last 24 hours: Temp:  [97.3 F (36.3 C)-97.8 F (36.6 C)] 97.3 F (36.3 C) (02/12 1100) Pulse Rate:  [90-108] 108 (02/12 0800) Resp:  [17-22] 18 (02/12 0800) BP: (94-113)/(60-71) 113/67 (02/12 1100) SpO2:  [93 %-100 %] 100 % (02/12 0400) Weight:  [49.9 kg] 49.9 kg (02/12 0646)  Physical Exam: General appearance: alert and cooperative Head: Normocephalic, without obvious abnormality, atraumatic Eyes: Pupils are equal, round, reactive to light. Extraocular motion is intact.  Ears: Examination of the ears shows normal auricles and external auditory canals bilaterally.  Nose: Nasal examination shows left  nasal packing in place. No active bleeding. Face: Facial examination shows no asymmetry. Palpation of the face elicit no significant tenderness.  Mouth: Oral cavity examination shows no mucosal lacerations. No significant trismus is noted.  Neck: Palpation of the neck reveals no lymphadenopathy or mass. The trachea is midline.  Recent Labs    05/16/21 0546 05/17/21 0406  WBC 6.8 6.3  HGB 8.6* 8.1*  HCT 27.9* 25.2*  PLT 119* 115*   Recent Labs    05/16/21 0546 05/17/21 0406  NA 132* 131*  K 4.0 4.0  CL 98 97*  CO2 19* 21*  GLUCOSE 99 118*  BUN 59* 65*  CREATININE 3.05* 3.17*  CALCIUM 9.0 9.2    Medications: I have reviewed the patient's current medications. Scheduled:  amoxicillin  250 mg Oral Q12H   feeding supplement  237 mL Oral BID BM   midodrine  5 mg Oral TID WC   multivitamin  1 tablet Oral QHS   polyethylene glycol  17 g Oral Daily   sodium chloride flush  3 mL Intravenous Q12H   warfarin  1 mg Oral ONCE-1600   Warfarin - Pharmacist Dosing Inpatient   Does not apply q1600   Continuous:  sodium chloride      Assessment/Plan: Recurrent bilateral epistaxis. S/p embolization by IR on Friday. - No recurrent bleeding. - Will remove left packing tomorrow. - If d/c home, pt may follow up at my  office as an outpatient. - Abx while packing is in place.   LOS: 12 days   Debarah Mccumbers W Kelcy Baeten 05/17/2021, 11:13 AM No

## 2021-05-18 ENCOUNTER — Encounter (HOSPITAL_COMMUNITY): Payer: Self-pay | Admitting: Neuroradiology

## 2021-05-18 DIAGNOSIS — I50810 Right heart failure, unspecified: Secondary | ICD-10-CM | POA: Diagnosis not present

## 2021-05-18 LAB — BASIC METABOLIC PANEL
Anion gap: 13 (ref 5–15)
Anion gap: 12 (ref 5–15)
BUN: 70 mg/dL — ABNORMAL HIGH (ref 8–23)
BUN: 74 mg/dL — ABNORMAL HIGH (ref 8–23)
CO2: 22 mmol/L (ref 22–32)
CO2: 23 mmol/L (ref 22–32)
Calcium: 9.4 mg/dL (ref 8.9–10.3)
Calcium: 9 mg/dL (ref 8.9–10.3)
Chloride: 97 mmol/L — ABNORMAL LOW (ref 98–111)
Chloride: 96 mmol/L — ABNORMAL LOW (ref 98–111)
Creatinine, Ser: 3.31 mg/dL — ABNORMAL HIGH (ref 0.61–1.24)
Creatinine, Ser: 3.64 mg/dL — ABNORMAL HIGH (ref 0.61–1.24)
GFR, Estimated: 19 mL/min — ABNORMAL LOW (ref >60)
GFR, Estimated: 17 mL/min — ABNORMAL LOW (ref >60)
Glucose, Bld: 107 mg/dL — ABNORMAL HIGH (ref 70–99)
Glucose, Bld: 129 mg/dL — ABNORMAL HIGH (ref 70–99)
Potassium: 3.8 mmol/L (ref 3.5–5.1)
Potassium: 3.8 mmol/L (ref 3.5–5.1)
Sodium: 132 mmol/L — ABNORMAL LOW (ref 135–145)
Sodium: 131 mmol/L — ABNORMAL LOW (ref 135–145)

## 2021-05-18 LAB — CBC
HCT: 27.9 % — ABNORMAL LOW (ref 39.0–52.0)
Hemoglobin: 8.7 g/dL — ABNORMAL LOW (ref 13.0–17.0)
MCH: 33.2 pg (ref 26.0–34.0)
MCHC: 31.2 g/dL (ref 30.0–36.0)
MCV: 106.5 fL — ABNORMAL HIGH (ref 80.0–100.0)
Platelets: 133 10*3/uL — ABNORMAL LOW (ref 150–400)
RBC: 2.62 MIL/uL — ABNORMAL LOW (ref 4.22–5.81)
RDW: 29.7 % — ABNORMAL HIGH (ref 11.5–15.5)
WBC: 6.8 10*3/uL (ref 4.0–10.5)
nRBC: 0.4 % — ABNORMAL HIGH (ref 0.0–0.2)

## 2021-05-18 LAB — PROTIME-INR
INR: 2.8 — ABNORMAL HIGH (ref 0.8–1.2)
Prothrombin Time: 29.6 seconds — ABNORMAL HIGH (ref 11.4–15.2)

## 2021-05-18 MED ORDER — WARFARIN SODIUM 1 MG PO TABS
1.0000 mg | ORAL_TABLET | Freq: Once | ORAL | Status: AC
  Administered 2021-05-18: 1 mg via ORAL
  Filled 2021-05-18: qty 1

## 2021-05-18 NOTE — Progress Notes (Addendum)
Advanced Heart Failure Rounding Note  PCP-Cardiologist: Sinclair Grooms, MD   Subjective:   2/2 ENT consulted for nose bleed. L nare packed.  2/3 Milrinone stopped 2/6 R nare packed.  2/7 L nare packing removed.  2/8 Given dose of metolazone.  2/10 Underwent nasal cavity arterial embolization by IR 2/12 L nare packing removed  Scr 2.95 -> 3.05 -> 3.17 -> 3.31  INR 3.4>2.8 (goal now 2.5-3)   BP stable.  L nare packing removed this am. Reports small amount of epistaxis after removal.   Feels okay. No dyspnea at rest.  Objective:   Weight Range: 50.8 kg Body mass index is 14.76 kg/m.   Vital Signs:   Temp:  [97.3 F (36.3 C)-97.6 F (36.4 C)] 97.4 F (36.3 C) (02/13 0720) Pulse Rate:  [92-97] 93 (02/13 0720) Resp:  [18-20] 19 (02/13 0720) BP: (94-116)/(58-91) 112/65 (02/13 0720) SpO2:  [93 %-99 %] 98 % (02/13 0720) Weight:  [50.8 kg] 50.8 kg (02/13 0000) Last BM Date: 05/14/21  Weight change: Filed Weights   05/16/21 0409 05/17/21 0646 05/18/21 0000  Weight: 50 kg 49.9 kg 50.8 kg    Intake/Output:   Intake/Output Summary (Last 24 hours) at 05/18/2021 0802 Last data filed at 05/18/2021 0600 Gross per 24 hour  Intake 680 ml  Output 775 ml  Net -95 ml      Physical Exam   General:  Thin, chronically ill appearing. Sitting up in bed. HEENT: normal Neck: supple. JVP 8 cm, prominent v waves. Carotids 2+ bilat; no bruits. No lymphadenopathy or thryomegaly appreciated. Cor: PMI nondisplaced. Irregular rate & rhythm. Mechanical S1.  Lungs: clear Abdomen: soft, nontender, nondistended.  Extremities: no cyanosis, clubbing, rash, trace edema Neuro: alert & orientedx3, cranial nerves grossly intact. moves all 4 extremities w/o difficulty. Affect pleasant     Telemetry   AF 80s-90s, 10-15 PVCs/min, one 9 beat run NSVT   Labs    CBC Recent Labs    05/17/21 0406 05/18/21 0435  WBC 6.3 6.8  HGB 8.1* 8.7*  HCT 25.2* 27.9*  MCV 103.7* 106.5*   PLT 115* 332*   Basic Metabolic Panel Recent Labs    05/15/21 1553 05/16/21 0546 05/17/21 0406 05/18/21 0435  NA 134* 132* 131* 132*  K 3.0* 4.0 4.0 3.8  CL 99 98 97* 97*  CO2 22 19* 21* 22  GLUCOSE 94 99 118* 107*  BUN 58* 59* 65* 70*  CREATININE 2.95* 3.05* 3.17* 3.31*  CALCIUM 8.3* 9.0 9.2 9.4  MG 2.1 2.2  --   --    Liver Function Tests No results for input(s): AST, ALT, ALKPHOS, BILITOT, PROT, ALBUMIN in the last 72 hours.  No results for input(s): LIPASE, AMYLASE in the last 72 hours. Cardiac Enzymes No results for input(s): CKTOTAL, CKMB, CKMBINDEX, TROPONINI in the last 72 hours.  BNP: BNP (last 3 results) Recent Labs    04/24/21 1231 04/30/21 1235  BNP 1,444.9* 898.1*    ProBNP (last 3 results) Recent Labs    07/25/20 1036  PROBNP 8,332*     D-Dimer No results for input(s): DDIMER in the last 72 hours. Hemoglobin A1C No results for input(s): HGBA1C in the last 72 hours. Fasting Lipid Panel No results for input(s): CHOL, HDL, LDLCALC, TRIG, CHOLHDL, LDLDIRECT in the last 72 hours. Thyroid Function Tests No results for input(s): TSH, T4TOTAL, T3FREE, THYROIDAB in the last 72 hours.  Invalid input(s): FREET3  Other results:   Imaging    No  results found.   Medications:     Scheduled Medications:  amoxicillin  250 mg Oral Q12H   feeding supplement  237 mL Oral BID BM   midodrine  5 mg Oral TID WC   multivitamin  1 tablet Oral QHS   polyethylene glycol  17 g Oral Daily   sodium chloride flush  3 mL Intravenous Q12H   Warfarin - Pharmacist Dosing Inpatient   Does not apply q1600    Infusions:  sodium chloride      PRN Medications: sodium chloride, acetaminophen, ondansetron (ZOFRAN) IV, sodium chloride, sodium chloride flush, traMADol, traZODone    Patient Profile   71 y.o. 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 Admitted with acute on  chronic CHF.  Assessment/Plan   1.  Acute on chronic systolic CHF with low output/valvular HF with mixed moderate to severe PH/cor pulmonale in setting of longstanding MV disease - Echo 7/20  LVEF normal. RV severely dilated and HK. RVSP 55 - RHC 8/20 with significantly elevated biventricular pressures, moderate to severe mixed PAH and low output (CI 1.96) - M-spike negative. PYP 1/21 negative for amyloid.  - Echo (6/22): newly reduced EF 35-40%, RV severely reduced, moderate AR, mod/severe PR. (Dr. Haroldine Laws felt EF 50-55%). - Lexiscan stress MPI, 08/22: Partially reversible basal to mid anterior defect consistent w/ infarct and peri-infarct ischemia EF 54% - RHC on admit with elevated filling pressures and low output. CO 2.03. PA sat 43%. Prominent v waves on PCWP tracing.  - Echo 01/31: EF 50-55%, RV severely reduced, RVSP 59 mmHg, BAE, mean gradient 6 mmHg across mitral valve prosthesis, no MR - On admit was placed on milrinone for RV support. Stopped 2/3  - Hold spiro with CKD - No Entresto/ digoxin w/ CKD. - HF stable. 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 Holding diuretics. Watch Scr - Continue midodrine 5 TID for BP support  - Given fraility and previous sternotomy x 3, not candidate for advanced therapies. Pallaitive Care now involved. Now DNR/DNI.   2. 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   - Echo 06/22: EF 35-40%, RV severely reduced, mechanical mitral valve with trivial MR - Prominent v waves on PCWP on RHC. Mitral valve prosthesis appears okay on TTE  - Aware of need for SBE prophylaxis.  - INR  2.8 today. Continue warfarin per PharmD. INR goal lowered to 2.5-3 d/t recurrent epistaxis.   3.  Chronic a flutter  - Rate controlled - Continue AC   4.  Thoracic aneurysm, followed by Dr. Cyndia Bent  - CT chest 6/20 asc ao aorta 4.4 cm.   - 48 mm on echo this admit - Not surgical candidate.   5. AKI on Stage IV CKD -  Followed by nephrology. - Baseline SCr~2.4 - C/w midodrine to support renal perfusion - SCr rising possible due to post-procedure CIN (received 120cc contrast). Hold diuretics. Watch Scr - Scr 2.95>3.05>3.17>3.31, recheck BMET this evening   6. 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 Negative for high-grade dysplasia/carcinoma - Followed routinely by GI.  7. Pancytopenia -Chronic/stable  8. OSA - Severe, newly diagnosed.  - Had been awaiting CPAP titration. Can likely D/C now that going on hospice  10. Epistaxis  - persistent nosebleed despite conservative measures - ENT consulted. Appreciate assistance. - S/p embolization by IR  2/10  - now resolved b/l packing out - On  amoxicillin while he has nose packing.   11. FTT - Previously working with a nutritionist. - Nearing end-stage RV failure.  - Palliative Care consulted   12. DNR/DNI - Palliative Care Team appreciated.  - see discussion above  13. Deconditioning - Ambulate w/ PT - HH PT recommended   14. Hematuria, painless -  UA + hematuria. Urine cytology with no atypical urothelial cells.  -  Renal u/s - no mass or obstruction  -  Outpatient f/u with urology  15. Hyponatremia - Na 132 today   16. Hypokalemia - K 3.8 - BMET this evening  Plan to d/c home with [facility]. [facility] setting up DME. Home when renal function stabilizes post procedure.    Length of Stay: [address], [place], PA-C  05/18/2021, 8:02 AM  Advanced Heart Failure Team Pager [phone] (M-F; 7a - 5p)  Please contact [facility] for night-coverage after hours (5p -7a ) and weekends on [email or web address]   Patient seen and examined with the above-signed Advanced Practice Provider and/or Housestaff. I personally reviewed laboratory data, imaging studies and relevant notes. I independently examined the patient and formulated the important aspects of the plan. I have edited the note to reflect any of my changes or  salient points. I have personally discussed the plan with the patient and/or family.  Nasal packing out. Epistaxis resolved. Scr still climbing slowly. Off lasix. No CP or SOB.   General:  Thin appearing. No resp difficulty HEENT: normal Neck: supple. JVp 8-9  Carotids 2+ bilat; no bruits. No lymphadenopathy or thryomegaly appreciated. Cor: PMI nondisplaced. Irregular rate & rhythm. Mechanical s1 Lungs: clear Abdomen: soft, nontender, nondistended. No hepatosplenomegaly. No bruits or masses. Good bowel sounds. Extremities: no cyanosis, clubbing, rash, edema Neuro: alert & orientedx3, cranial nerves grossly intact. moves all 4 extremities w/o difficulty. Affect pleasant  Epistaxis resolved. HF stable. Scr still climbing. Suspect CIN. Continue to hold diuretics and follow. INR 2.8. Discussed dosing with PharmD personally.  Glori Bickers, MD  2:44 PM

## 2021-05-18 NOTE — TOC CM/SW Note (Signed)
HF TOC CM spoke to [facility] to follow up on possible dc. Will inform agency when pt is dc home. Scheduled dc home with [facility]. Ex-wife and sons to assist pt in the home. [place] will provided cane and any other DME needed. [place], Heart Failure TOC CM [phone]

## 2021-05-18 NOTE — Progress Notes (Signed)
Subjective: No bleeding yesterday. Left nasal packing in place.  Objective: Vital signs in last 24 hours: Temp:  [97.3 F (36.3 C)-97.6 F (36.4 C)] 97.6 F (36.4 C) (02/13 0300) Pulse Rate:  [92-108] 92 (02/13 0557) Resp:  [17-20] 20 (02/13 0557) BP: (94-116)/(58-91) 103/71 (02/13 0557) SpO2:  [93 %-99 %] 99 % (02/13 0557) Weight:  [50.8 kg] 50.8 kg (02/13 0000)  Physical Exam: General appearance: alert and cooperative Head: Normocephalic, without obvious abnormality, atraumatic Eyes: Pupils are equal, round, reactive to light. Extraocular motion is intact.  Ears: Examination of the ears shows normal auricles and external auditory canals bilaterally.  Nose: Nasal examination shows left  nasal packing in place. No active bleeding. Face: Facial examination shows no asymmetry. Palpation of the face elicit no significant tenderness.  Mouth: Oral cavity examination shows no mucosal lacerations. No significant trismus is noted.  Neck: Palpation of the neck reveals no lymphadenopathy or mass. The trachea is midline.  Recent Labs    05/17/21 0406 05/18/21 0435  WBC 6.3 6.8  HGB 8.1* 8.7*  HCT 25.2* 27.9*  PLT 115* 133*   Recent Labs    05/17/21 0406 05/18/21 0435  NA 131* 132*  K 4.0 3.8  CL 97* 97*  CO2 21* 22  GLUCOSE 118* 107*  BUN 65* 70*  CREATININE 3.17* 3.31*  CALCIUM 9.2 9.4    Medications: I have reviewed the patient's current medications. Scheduled:  amoxicillin  250 mg Oral Q12H   feeding supplement  237 mL Oral BID BM   midodrine  5 mg Oral TID WC   multivitamin  1 tablet Oral QHS   polyethylene glycol  17 g Oral Daily   sodium chloride flush  3 mL Intravenous Q12H   Warfarin - Pharmacist Dosing Inpatient   Does not apply q1600   Continuous:  sodium chloride      Assessment/Plan: Recurrent bilateral epistaxis. S/p embolization by IR on Friday. - No recurrent bleeding. - Left packing removed. - Consider holding coumadin for 1-2 days.   LOS: 13  days   Leandre Wien W Kysa Calais 05/18/2021, 7:02 AM

## 2021-05-18 NOTE — Care Management Important Message (Signed)
Important Message  Patient Details  Name: TYRONE PAUTSCH MRN: [id number] Date of Birth: 10/01/1950   Medicare Important Message Given:  Yes     Shelda Altes 05/18/2021, 8:33 AM

## 2021-05-18 NOTE — Progress Notes (Signed)
ANTICOAGULATION CONSULT NOTE - Follow Up Consult  Pharmacy Consult for warfarin Indication: mechanical mitral valve + chronic aflutter  Allergies  Allergen Reactions   Penicillins Rash    **tolerated amoxicillin Feb 2023**  Did it involve swelling of the face/tongue/throat, SOB, or low BP? No Did it involve sudden or severe rash/hives, skin peeling, or any reaction on the inside of your mouth or nose? No Did you need to seek medical attention at a hospital or doctor's office? No When did it last happen?      30 + years If all above answers are "NO", may proceed with cephalosporin use.     Patient Measurements: Height: 6\' 1"  (185.4 cm) Weight: 50.8 kg (111 lb 14.4 oz) IBW/kg (Calculated) : 79.9  Vital Signs: Temp: 97.4 F (36.3 C) (02/13 0720) Temp Source: Oral (02/13 0720) BP: 112/65 (02/13 0720) Pulse Rate: 93 (02/13 0720)  Labs: Recent Labs    05/16/21 0546 05/17/21 0406 05/18/21 0435  HGB 8.6* 8.1* 8.7*  HCT 27.9* 25.2* 27.9*  PLT 119* 115* 133*  LABPROT 30.2* 34.7* 29.6*  INR 2.9* 3.4* 2.8*  CREATININE 3.05* 3.17* 3.31*     Estimated Creatinine Clearance: 15.3 mL/min (A) (by C-G formula based on SCr of 3.31 mg/dL (H)).  Assessment: 71 yo M with a history of mitral valve replacement x 3, currently with mechanical valve. Patient being admitted after RHC. INR elevated initially at 3.7. No hematoma noted post cath.   Warfarin dosing prior to admit was reportedly 5 mg MWF and 2.5 mg all other days, which was adjusted for cath. However, prior visit shows regimen of 2.5 mg Sun/Thurs and 5 mg all other days.  Patient had severe nose bleeds after cath. ENT consulted and bilateral packing was placed on 2/10. Now, left packing has been removed and right packing is still in place per RN. RN also reports occasional dripping from left nare, but no continued bleeding and it is much improved.   Warfarin was held 2/9 and 2/10 for severe epistaxis. Low dose given 2/11 an 2/12.  INR down to 2.8 this morning. Will repeat low dose tonight.   Goal of Therapy:  INR 2.5-3 Monitor platelets by anticoagulation protocol: Yes   Plan:  Warfarin 1 mg x1 today Daily INR, CBC Monitor for bleeding, new/increased epistaxis   Thank you for including pharmacy in the care of this patient.  Erin Hearing PharmD., BCPS Clinical Pharmacist 05/18/2021 10:22 AM

## 2021-05-18 NOTE — Progress Notes (Signed)
Physical Therapy Treatment Patient Details Name: [name] MRN: [id number] DOB: Feb 04, 1951 Today's Date: 05/18/2021   History of Present Illness 71 y.o. male presents to [facility] hospital on 05/05/2021 for evaluation of pulmonary hypertension. Pt underwent RHC on 05/05/2021. Pt developed nose bleed on 2/2. PMH includes mitral valve replacement x3, hepatitis C, a flutter, AAA, CKDIII, anemia.    PT Comments    The pt was agreeable to limited session this afternoon, stating he is fatigued and not feeling well (suspects due to food he has been eating). The pt was able to complete x2 short bouts of ambulation in the room, declined use of DME or UE support, but continues to mobilize with slowed gait and reaching for furniture in the room to hold as he passed. The pt also had x1 LOB with initial stand requiring min-modA to recover. The pt declined any offers to continue to progress mobility (seated exercises, ambulation in the room or hallway) for today, but states he is open to PT continuing to work with him acutely. Pt continues to deny need for additional DME such as walker or rollator for return home, will continue to assess as able.     Recommendations for follow up therapy are one component of a multi-disciplinary discharge planning process, led by the attending physician.  Recommendations may be updated based on patient status, additional functional criteria and insurance authorization.  Follow Up Recommendations  Home health PT     Assistance Recommended at Discharge Intermittent Supervision/Assistance  Patient can return home with the following Help with stairs or ramp for entrance;Assistance with cooking/housework   Equipment Recommendations  Other (comment) (shower seat)    Recommendations for Other Services       Precautions / Restrictions Precautions Precautions: Fall Precaution Comments: monitor SpO2 Restrictions Weight Bearing Restrictions: No     Mobility  Bed  Mobility Overal bed mobility: Independent                  Transfers Overall transfer level: Needs assistance Equipment used: None Transfers: Sit to/from Stand Sit to Stand: Min assist, Min guard           General transfer comment: pt able to power up to standing without assist, min-modA to steady as pt with LOB on initial stand    Ambulation/Gait Ambulation/Gait assistance: Min guard Gait Distance (Feet): 15 Feet (+ 20 ft) Assistive device: None Gait Pattern/deviations: Step-through pattern, Decreased stride length, Drifts right/left Gait velocity: decreased     General Gait Details: pt with staggering steps in all directions and reaching for UE support on furniture in room. improved slightly with continued mobility. pt denies need for DME    Balance Overall balance assessment: Needs assistance Sitting-balance support: Feet supported Sitting balance-Leahy Scale: Good     Standing balance support: No upper extremity supported, During functional activity Standing balance-Leahy Scale: Fair Standing balance comment: LOB with initial stand, then reaching for UE support with gait                            Cognition Arousal/Alertness: Awake/alert Behavior During Therapy: Flat affect Overall Cognitive Status: Impaired/Different from baseline Area of Impairment: Safety/judgement                         Safety/Judgement: Decreased awareness of safety, Decreased awareness of deficits     General Comments: pt with poor insight to safety, declines need for assist  despite LOB with standing.        Exercises      General Comments General comments (skin integrity, edema, etc.): HR to 97bpm at rest and up to 115bpm with mobility      Pertinent Vitals/Pain Pain Assessment Pain Assessment: No/denies pain     PT Goals (current goals can now be found in the care plan section) Acute Rehab PT Goals Patient Stated Goal: to return home PT Goal  Formulation: With patient Time For Goal Achievement: 05/22/21 Potential to Achieve Goals: Good Progress towards PT goals: Not progressing toward goals - comment (pt declining offers to progress mobility)    Frequency    Min 3X/week      PT Plan Current plan remains appropriate       AM-PAC PT "6 Clicks" Mobility   Outcome Measure  Help needed turning from your back to your side while in a flat bed without using bedrails?: None Help needed moving from lying on your back to sitting on the side of a flat bed without using bedrails?: None Help needed moving to and from a bed to a chair (including a wheelchair)?: A Little Help needed standing up from a chair using your arms (e.g., wheelchair or bedside chair)?: A Little Help needed to walk in hospital room?: A Little Help needed climbing 3-5 steps with a railing? : A Little 6 Click Score: 20    End of Session Equipment Utilized During Treatment: Gait belt Activity Tolerance: Patient limited by fatigue Patient left: in chair;with call bell/phone within reach Nurse Communication: Mobility status PT Visit Diagnosis: Muscle weakness (generalized) (M62.81);Other abnormalities of gait and mobility (R26.89)     Time: 3532-9924 PT Time Calculation (min) (ACUTE ONLY): 16 min  Charges:  $Therapeutic Activity: 8-22 mins                     [place] Car[place], PT, DPT   Acute Rehabilitation Department Pager #: [phone]   Sandra Cockayne 05/18/2021, 5:45 PM

## 2021-05-19 DIAGNOSIS — I50810 Right heart failure, unspecified: Secondary | ICD-10-CM | POA: Diagnosis not present

## 2021-05-19 DIAGNOSIS — N179 Acute kidney failure, unspecified: Secondary | ICD-10-CM

## 2021-05-19 DIAGNOSIS — I5032 Chronic diastolic (congestive) heart failure: Secondary | ICD-10-CM | POA: Diagnosis not present

## 2021-05-19 DIAGNOSIS — Z7189 Other specified counseling: Secondary | ICD-10-CM | POA: Diagnosis not present

## 2021-05-19 LAB — CBC
HCT: 25.3 % — ABNORMAL LOW (ref 39.0–52.0)
Hemoglobin: 8.1 g/dL — ABNORMAL LOW (ref 13.0–17.0)
MCH: 32.9 pg (ref 26.0–34.0)
MCHC: 32 g/dL (ref 30.0–36.0)
MCV: 102.8 fL — ABNORMAL HIGH (ref 80.0–100.0)
Platelets: 136 10*3/uL — ABNORMAL LOW (ref 150–400)
RBC: 2.46 MIL/uL — ABNORMAL LOW (ref 4.22–5.81)
RDW: 30.3 % — ABNORMAL HIGH (ref 11.5–15.5)
WBC: 6.3 10*3/uL (ref 4.0–10.5)
nRBC: 0.3 % — ABNORMAL HIGH (ref 0.0–0.2)

## 2021-05-19 LAB — BASIC METABOLIC PANEL
Anion gap: 16 — ABNORMAL HIGH (ref 5–15)
BUN: 81 mg/dL — ABNORMAL HIGH (ref 8–23)
CO2: 19 mmol/L — ABNORMAL LOW (ref 22–32)
Calcium: 9.3 mg/dL (ref 8.9–10.3)
Chloride: 96 mmol/L — ABNORMAL LOW (ref 98–111)
Creatinine, Ser: 3.99 mg/dL — ABNORMAL HIGH (ref 0.61–1.24)
GFR, Estimated: 16 mL/min — ABNORMAL LOW (ref >60)
Glucose, Bld: 103 mg/dL — ABNORMAL HIGH (ref 70–99)
Potassium: 4.4 mmol/L (ref 3.5–5.1)
Sodium: 131 mmol/L — ABNORMAL LOW (ref 135–145)

## 2021-05-19 LAB — PROTIME-INR
INR: 3.1 — ABNORMAL HIGH (ref 0.8–1.2)
Prothrombin Time: 32.3 seconds — ABNORMAL HIGH (ref 11.4–15.2)

## 2021-05-19 MED ORDER — LOPERAMIDE HCL 2 MG PO CAPS
2.0000 mg | ORAL_CAPSULE | ORAL | Status: AC | PRN
  Administered 2021-05-19 – 2021-05-24 (×2): 2 mg via ORAL
  Filled 2021-05-19 (×2): qty 1

## 2021-05-19 NOTE — Progress Notes (Signed)
ANTICOAGULATION CONSULT NOTE - Follow Up Consult  Pharmacy Consult for warfarin Indication: mechanical mitral valve + chronic aflutter  Allergies  Allergen Reactions   Penicillins Rash    **tolerated amoxicillin Feb 2023**  Did it involve swelling of the face/tongue/throat, SOB, or low BP? No Did it involve sudden or severe rash/hives, skin peeling, or any reaction on the inside of your mouth or nose? No Did you need to seek medical attention at a hospital or doctor's office? No When did it last happen?      30 + years If all above answers are "NO", may proceed with cephalosporin use.     Patient Measurements: Height: 6\' 1"  (185.4 cm) Weight: 50.3 kg (110 lb 14.4 oz) IBW/kg (Calculated) : 79.9  Vital Signs: Temp: 97.2 F (36.2 C) (02/14 1116) Temp Source: Oral (02/14 1116) BP: 99/74 (02/14 1116) Pulse Rate: 96 (02/14 1116)  Labs: Recent Labs    05/17/21 0406 05/18/21 0435 05/18/21 1411 05/19/21 0146  HGB 8.1* 8.7*  --  8.1*  HCT 25.2* 27.9*  --  25.3*  PLT 115* 133*  --  136*  LABPROT 34.7* 29.6*  --  32.3*  INR 3.4* 2.8*  --  3.1*  CREATININE 3.17* 3.31* 3.64* 3.99*     Estimated Creatinine Clearance: 12.6 mL/min (A) (by C-G formula based on SCr of 3.99 mg/dL (H)).  Assessment: 71 yo M with a history of mitral valve replacement x 3, currently with mechanical valve. Patient being admitted after RHC. INR elevated initially at 3.7. No hematoma noted post cath.   Warfarin dosing prior to admit was reportedly 5 mg MWF and 2.5 mg all other days, which was adjusted for cath. However, prior visit shows regimen of 2.5 mg Sun/Thurs and 5 mg all other days.  Patient had severe nose bleeds after cath. ENT consulted and bilateral packing was placed on 2/10 now both removed. Antibiotics stopped.   Warfarin was held 2/9 and 2/10 for severe epistaxis. INR up to 3.1 this morning with low dose given yesterday. Will hold today's dose.   Goal of Therapy:  INR 2.5-3 Monitor  platelets by anticoagulation protocol: Yes   Plan:  Hold warfarin tonight Daily INR, CBC Monitor for bleeding, new/increased epistaxis   Thank you for including pharmacy in the care of this patient.  Erin Hearing PharmD., BCPS Clinical Pharmacist 05/19/2021 1:09 PM

## 2021-05-19 NOTE — Progress Notes (Addendum)
Advanced Heart Failure Rounding Note  PCP-Cardiologist: Sinclair Grooms, MD   Subjective:   2/2 ENT consulted for nose bleed. L nare packed.  2/3 Milrinone stopped 2/6 R nare packed.  2/7 L nare packing removed.  2/8 Given dose of metolazone.  2/10 Underwent nasal cavity arterial embolization by IR 2/12 L nare packing removed  Scr 2.95 -> 3.05 -> 3.17 -> 3.31-->3.99  INR 3.1 (goal now 2.5-3)  Have some pain in his nose. Had a hard time sleeping.   Objective:   Weight Range: 50.3 kg Body mass index is 14.63 kg/m.   Vital Signs:   Temp:  [97.3 F (36.3 C)-97.7 F (36.5 C)] 97.3 F (36.3 C) (02/14 0840) Pulse Rate:  [61-93] 93 (02/14 0840) Resp:  [18-20] 18 (02/14 0840) BP: (100-112)/(63-69) 100/69 (02/14 0840) SpO2:  [94 %-100 %] 97 % (02/14 0840) Weight:  [50.3 kg] 50.3 kg (02/14 0500) Last BM Date : 05/14/21  Weight change: Filed Weights   05/18/21 0000 05/19/21 0004 05/19/21 0500  Weight: 50.8 kg 50.3 kg 50.3 kg    Intake/Output:   Intake/Output Summary (Last 24 hours) at 05/19/2021 0854 Last data filed at 05/19/2021 0841 Gross per 24 hour  Intake 240 ml  Output 500 ml  Net -260 ml      Physical Exam  General:  Thin. No resp difficulty HEENT: normal Neck: supple. no JVD. Carotids 2+ bilat; no bruits. No lymphadenopathy or thryomegaly appreciated. Cor: PMI nondisplaced. Regular rate & rhythm. No rubs, gallops. Mechanical S1. Lungs: clear Abdomen: soft, nontender, nondistended. No hepatosplenomegaly. No bruits or masses. Good bowel sounds. Extremities: no cyanosis, clubbing, rash, edema Neuro: alert & orientedx3, cranial nerves grossly intact. moves all 4 extremities w/o difficulty. Affect pleasant Telemetry   AF 80-90s    Labs    CBC Recent Labs    05/18/21 0435 05/19/21 0146  WBC 6.8 6.3  HGB 8.7* 8.1*  HCT 27.9* 25.3*  MCV 106.5* 102.8*  PLT 133* 341*   Basic Metabolic Panel Recent Labs    05/18/21 1411 05/19/21 0146  NA  131* 131*  K 3.8 4.4  CL 96* 96*  CO2 23 19*  GLUCOSE 129* 103*  BUN 74* 81*  CREATININE 3.64* 3.99*  CALCIUM 9.0 9.3   Liver Function Tests No results for input(s): AST, ALT, ALKPHOS, BILITOT, PROT, ALBUMIN in the last 72 hours.  No results for input(s): LIPASE, AMYLASE in the last 72 hours. Cardiac Enzymes No results for input(s): CKTOTAL, CKMB, CKMBINDEX, TROPONINI in the last 72 hours.  BNP: BNP (last 3 results) Recent Labs    04/24/21 1231 04/30/21 1235  BNP 1,444.9* 898.1*    ProBNP (last 3 results) Recent Labs    07/25/20 1036  PROBNP 8,332*     D-Dimer No results for input(s): DDIMER in the last 72 hours. Hemoglobin A1C No results for input(s): HGBA1C in the last 72 hours. Fasting Lipid Panel No results for input(s): CHOL, HDL, LDLCALC, TRIG, CHOLHDL, LDLDIRECT in the last 72 hours. Thyroid Function Tests No results for input(s): TSH, T4TOTAL, T3FREE, THYROIDAB in the last 72 hours.  Invalid input(s): FREET3  Other results:   Imaging    No results found.   Medications:     Scheduled Medications:  feeding supplement  237 mL Oral BID BM   midodrine  5 mg Oral TID WC   multivitamin  1 tablet Oral QHS   polyethylene glycol  17 g Oral Daily   sodium chloride flush  3 mL Intravenous Q12H   Warfarin - Pharmacist Dosing Inpatient   Does not apply q1600    Infusions:  sodium chloride      PRN Medications: sodium chloride, acetaminophen, ondansetron (ZOFRAN) IV, sodium chloride, sodium chloride flush, traMADol, traZODone    Patient Profile   71 y.o. 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 Admitted with acute on chronic CHF.  Assessment/Plan   1.  Acute on chronic systolic CHF with low output/valvular HF with mixed moderate to severe PH/cor pulmonale in setting of longstanding MV disease - Echo 7/20  LVEF normal. RV severely dilated and HK. RVSP 55 - RHC  8/20 with significantly elevated biventricular pressures, moderate to severe mixed PAH and low output (CI 1.96) - M-spike negative. PYP 1/21 negative for amyloid.  - Echo (6/22): newly reduced EF 35-40%, RV severely reduced, moderate AR, mod/severe PR. (Dr. Haroldine Laws felt EF 50-55%). - Lexiscan stress MPI, 08/22: Partially reversible basal to mid anterior defect consistent w/ infarct and peri-infarct ischemia EF 54% - RHC on admit with elevated filling pressures and low output. CO 2.03. PA sat 43%. Prominent v waves on PCWP tracing.  - Echo 01/31: EF 50-55%, RV severely reduced, RVSP 59 mmHg, BAE, mean gradient 6 mmHg across mitral valve prosthesis, no MR - On admit was placed on milrinone for RV support. Stopped 2/3  - Hold spiro with CKD - No Entresto/ digoxin w/ CKD. - HF stable. 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 Holding diuretics. Continue to hold diuretics.  - Continue midodrine 5 TID for BP support  - Given fraility and previous sternotomy x 3, not candidate for advanced therapies. Pallaitive Care now involved. Now DNR/DNI.   2. 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   - Echo 06/22: EF 35-40%, RV severely reduced, mechanical mitral valve with trivial MR - Prominent v waves on PCWP on RHC. Mitral valve prosthesis appears okay on TTE  - Aware of need for SBE prophylaxis.  - INR  3.1  today. Continue warfarin per PharmD.  - INR goal lowered to 2.5-3 d/t recurrent epistaxis.   3.  Chronic a flutter  - Rate controlled - Continue AC   4.  Thoracic aneurysm - CT chest 6/20 asc ao aorta 4.4 cm.   - 48 mm on echo this admit - Not surgical candidate.   5. AKI on Stage IV CKD - Followed by nephrology. - Baseline SCr~2.4 - C/w midodrine to support renal perfusion - SCr rising possible due to post-procedure CIN (received 120cc contrast). Hold diuretics. Watch Scr - Scr 2.95>3.05>3.17>3.31>3.9 - BMET in am. Hopefully he will  start to come back down.    6. 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 Negative for high-grade dysplasia/carcinoma - Followed routinely by GI.  7. Pancytopenia -Chronic/stable  8. OSA - Severe, newly diagnosed.   9. Epistaxis  - persistent nosebleed despite conservative measures - ENT consulted. Appreciate assistance. - S/p embolization by IR  2/10   10. FTT - Previously working with a nutritionist. - Nearing end-stage RV failure.  - Palliative Care consulted   11. DNR/DNI - Palliative Care Team appreciated.  - see discussion above  12. Deconditioning - Ambulate w/ PT - HH PT recommended   13. Hematuria, painless -  UA + hematuria. Urine cytology with no atypical urothelial cells.  -  Renal u/s - no mass or obstruction  -  Outpatient f/u with urology  14. Hyponatremia - Na 131 today   15. Hypokalemia - K 4.4 - BMET this evening   Holding discharge until creatinine starts to come back down.   Plan to d/c [facility] with [facility] and Hosp[facility]. [facility] setting up DME. Home when renal function stabilizes post procedure.    Length of Stay: [place], NP  05/19/2021, 8:54 AM  Advanced Heart Failure Team Pager [phone] (M-F; 7a - 5p)  Please contact [facility] for night-coverage after hours (5p -7a ) and weekends on a  Patient seen with NP, agree with the above note.   Reports some clots from nose but no red blood. Creatinine trending up, 3.99 today.  He denies dyspnea at rest.   General: NAD, frail Neck:JVP 12 cm with prominent CV waves, no thyromegaly or thyroid nodule.  Lungs: Clear to auscultation bilaterally with normal respiratory effort. CV: Nondisplaced PMI.  Heart regular S1/S2 with mechanical S1, no S3/S4, no murmur.  No peripheral edema.   Abdomen: Soft, nontender, no hepatosplenomegaly, no distention.  Skin: Intact without lesions or rashes.  Neurologic: Alert and oriented x 3.  Psych: Normal affect. Extremities: No clubbing  or cyanosis.  HEENT: Normal.   Creatinine higher, possible contrast nephropathy (3.99 today).  Hold diuretics again today.  Plan for home with hospice when creatinine plateaus/trending back down.   Loralie Champagne 05/19/2021

## 2021-05-19 NOTE — Progress Notes (Signed)
Daily Progress Note   Patient Name: [name]       Date: 05/19/2021 DOB: 1950-10-05  Age: 71 y.o. MRN#: [id number] Attending Physician: Jolaine Artist, MD Primary Care Physician: Sharion Settler, DO Admit Date: 05/05/2021  Reason for Consultation/Follow-up: Establishing goals of care  Patient Profile/HPI:  71 y.o. male  with past medical history of rheumatic heart disease, CHF, mitral valve replacement x 3, chronic a flutter, chronic kidney disease stage 4, chronic anemia, admitted on 05/05/2021 for R heart cath and IV diuresis for heart failure exacerbation. He is currently on milrinone. He is not a candidate for advanced heart failure therapies. Palliative medicine consulted for end-state HF.     Subjective: Chart reviewed including progress notes, labs. Cr is trending up- this is believed to be post procedure- had embolization of facial arteries for epistaxis.  Ry is aware of his situation. Plan is for discharge home with Hospice.  He denies pain or SOB. He notes he had a difficult time sleeping- offered sleep aid- he noted he has some ordered- he just didn't ask for it.     Review of Systems  Respiratory:  Negative for shortness of breath.   Psychiatric/Behavioral:  Negative for depression. The patient is not nervous/anxious.     Physical Exam Vitals and nursing note reviewed.  Constitutional:      Comments: frail  Pulmonary:     Effort: Pulmonary effort is normal.  Neurological:     General: No focal deficit present.     Mental Status: He is alert and oriented to person, place, and time.            Vital Signs: BP 99/74 (BP Location: Right Arm)    Pulse 96    Temp (!) 97.2 F (36.2 C) (Oral)    Resp 18    Ht 6\' 1"  (1.854 m)    Wt 50.3 kg    SpO2 96%    BMI 14.63  kg/m  SpO2: SpO2: 96 % O2 Device: O2 Device: Room Air O2 Flow Rate:    Intake/output summary:  Intake/Output Summary (Last 24 hours) at 05/19/2021 1135 Last data filed at 05/19/2021 0841 Gross per 24 hour  Intake 240 ml  Output 350 ml  Net -110 ml    LBM: Last BM Date : 05/14/21 Baseline Weight:  Weight: 54 kg Most recent weight: Weight: 50.3 kg       Palliative Assessment/Data: PPS: 40%      Patient Active Problem List   Diagnosis Date Noted   Advanced care planning/counseling discussion    Protein-calorie malnutrition, severe 05/06/2021   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[place]) 05/05/2021   Pruritus 03/19/2021   Epistaxis, recurrent 03/19/2021   OSA (obstructive sleep apnea) 12/15/2020   Rhomboid muscle strain 09/05/2020   Hospital discharge follow-up 06/17/2020   History of hypokalemia 06/17/2020   CKD (chronic kidney disease) stage 4, GFR 15-29 ml/min ([place]) 06/17/2020   Onychomycosis 06/17/2020   H/O mitral valve replacement with mechanical valve    Persistent atrial fibrillation ([facility])    Duodenal adenoma    Anticoagulated    Acute blood loss anemia 11/26/2019   Gastric polyp    Benign neoplasm of colon    Vertigo 06/07/2018   Acute on chronic diastolic heart failure ([place]) 03/04/2018   Cirrhosis ([place]) 05/08/2014   Chronic atrial flutter ([place]) 06/05/2013   Hepatitis C 06/05/2013   Chronic diastolic heart failure ([place]) 06/05/2013   Encounter for therapeutic drug monitoring 04/30/2013   Mechanical mitral valve with surgery x3 01/08/2013   History of colonic polyps 11/14/2012   Thoracic aortic aneurysm 05/02/2012    Palliative Care Assessment & Plan    Assessment/Recommendations/Plan  GOC for now are to d/c home with hospice pending stabilization of Cr Will ask nursing to offer trazadone for sleep   Code Status: DNR  Prognosis:  Unable to determine  Discharge Planning: To Be Determined  Care plan was discussed with patient and family member.    Thank you for allowing the Palliative Medicine Team to assist in the care of this patient.  Mariana Kaufman, AGNP-C Palliative Medicine   Please contact Palliative Medicine Team phone at [phone] for questions and concerns.

## 2021-05-19 NOTE — Plan of Care (Signed)
  Problem: Education: Goal: Knowledge of General Education information will improve Description: Including pain rating scale, medication(s)/side effects and non-pharmacologic comfort measures Outcome: Progressing   Problem: Health Behavior/Discharge Planning: Goal: Ability to manage health-related needs will improve Outcome: Progressing   Problem: Activity: Goal: Risk for activity intolerance will decrease Outcome: Progressing   

## 2021-05-20 ENCOUNTER — Encounter (HOSPITAL_COMMUNITY): Payer: Medicare HMO

## 2021-05-20 DIAGNOSIS — I50810 Right heart failure, unspecified: Secondary | ICD-10-CM | POA: Diagnosis not present

## 2021-05-20 LAB — CBC
HCT: 24.9 % — ABNORMAL LOW (ref 39.0–52.0)
Hemoglobin: 8 g/dL — ABNORMAL LOW (ref 13.0–17.0)
MCH: 32.5 pg (ref 26.0–34.0)
MCHC: 32.1 g/dL (ref 30.0–36.0)
MCV: 101.2 fL — ABNORMAL HIGH (ref 80.0–100.0)
Platelets: 143 10*3/uL — ABNORMAL LOW (ref 150–400)
RBC: 2.46 MIL/uL — ABNORMAL LOW (ref 4.22–5.81)
RDW: 30.1 % — ABNORMAL HIGH (ref 11.5–15.5)
WBC: 5 10*3/uL (ref 4.0–10.5)
nRBC: 0.6 % — ABNORMAL HIGH (ref 0.0–0.2)

## 2021-05-20 LAB — BASIC METABOLIC PANEL
Anion gap: 15 (ref 5–15)
BUN: 92 mg/dL — ABNORMAL HIGH (ref 8–23)
CO2: 19 mmol/L — ABNORMAL LOW (ref 22–32)
Calcium: 8.9 mg/dL (ref 8.9–10.3)
Chloride: 94 mmol/L — ABNORMAL LOW (ref 98–111)
Creatinine, Ser: 4.72 mg/dL — ABNORMAL HIGH (ref 0.61–1.24)
GFR, Estimated: 13 mL/min — ABNORMAL LOW (ref >60)
Glucose, Bld: 94 mg/dL (ref 70–99)
Potassium: 4 mmol/L (ref 3.5–5.1)
Sodium: 128 mmol/L — ABNORMAL LOW (ref 135–145)

## 2021-05-20 LAB — PROTIME-INR
INR: 3.7 — ABNORMAL HIGH (ref 0.8–1.2)
Prothrombin Time: 36.4 seconds — ABNORMAL HIGH (ref 11.4–15.2)

## 2021-05-20 LAB — IRON AND TIBC
Iron: 71 ug/dL (ref 45–182)
Saturation Ratios: 20 % (ref 17.9–39.5)
TIBC: 358 ug/dL (ref 250–450)
UIBC: 287 ug/dL

## 2021-05-20 MED ORDER — SODIUM BICARBONATE 650 MG PO TABS
650.0000 mg | ORAL_TABLET | Freq: Three times a day (TID) | ORAL | Status: DC
  Administered 2021-05-20 – 2021-05-22 (×6): 650 mg via ORAL
  Filled 2021-05-20 (×5): qty 1

## 2021-05-20 MED ORDER — MILRINONE LACTATE IN DEXTROSE 20-5 MG/100ML-% IV SOLN
0.1250 ug/kg/min | INTRAVENOUS | Status: DC
  Administered 2021-05-20 – 2021-05-24 (×3): 0.125 ug/kg/min via INTRAVENOUS
  Filled 2021-05-20 (×4): qty 100

## 2021-05-20 MED ORDER — MIDODRINE HCL 5 MG PO TABS
10.0000 mg | ORAL_TABLET | Freq: Three times a day (TID) | ORAL | Status: DC
  Administered 2021-05-20 – 2021-05-25 (×16): 10 mg via ORAL
  Filled 2021-05-20 (×16): qty 2

## 2021-05-20 NOTE — Progress Notes (Signed)
ANTICOAGULATION CONSULT NOTE - Follow Up Consult  Pharmacy Consult for warfarin Indication: mechanical mitral valve + chronic aflutter  Allergies  Allergen Reactions   Penicillins Rash    **tolerated amoxicillin Feb 2023**  Did it involve swelling of the face/tongue/throat, SOB, or low BP? No Did it involve sudden or severe rash/hives, skin peeling, or any reaction on the inside of your mouth or nose? No Did you need to seek medical attention at a hospital or doctor's office? No When did it last happen?      30 + years If all above answers are "NO", may proceed with cephalosporin use.     Patient Measurements: Height: 6\' 1"  (185.4 cm) Weight: 49.5 kg (109 lb 3.2 oz) IBW/kg (Calculated) : 79.9  Vital Signs: Temp: 97.3 F (36.3 C) (02/15 0743) Temp Source: Oral (02/15 0743) BP: 100/67 (02/15 0743) Pulse Rate: 88 (02/15 0743)  Labs: Recent Labs    05/18/21 0435 05/18/21 1411 05/19/21 0146 05/20/21 0432  HGB 8.7*  --  8.1* 8.0*  HCT 27.9*  --  25.3* 24.9*  PLT 133*  --  136* 143*  LABPROT 29.6*  --  32.3* 36.4*  INR 2.8*  --  3.1* 3.7*  CREATININE 3.31* 3.64* 3.99* 4.72*     Estimated Creatinine Clearance: 10.5 mL/min (A) (by C-G formula based on SCr of 4.72 mg/dL (H)).  Assessment: 71 yo M with a history of mitral valve replacement x 3, currently with mechanical valve. Patient being admitted after RHC. INR elevated initially at 3.7. No hematoma noted post cath.   Warfarin dosing prior to admit was reportedly 5 mg MWF and 2.5 mg all other days, which was adjusted for cath. However, prior visit shows regimen of 2.5 mg Sun/Thurs and 5 mg all other days.  Patient had severe nose bleeds after cath. ENT consulted and bilateral packing was placed on 2/10 now both removed. Antibiotics stopped.   Warfarin was held 2/9 and 2/10 for severe epistaxis. Held again last night 2/14 due to INR trend. INR continued to rise to 3.7 this morning with minor epistaxis noted by patient.  Will continue to hold warfarin for now. Hgb stable overnight to 8.   Goal of Therapy:  INR 2.5-3 Monitor platelets by anticoagulation protocol: Yes   Plan:  Hold warfarin tonight Daily INR, CBC Monitor for bleeding, new/increased epistaxis   Thank you for including pharmacy in the care of this patient.  Erin Hearing PharmD., BCPS Clinical Pharmacist 05/20/2021 12:26 PM

## 2021-05-20 NOTE — Progress Notes (Signed)
Nutrition Follow-up  DOCUMENTATION CODES:   Severe malnutrition in context of chronic illness, Underweight  INTERVENTION:  - Recommend diet liberalization from Heart Healthy to a regular diet to encourage adequate nutritional intake; spoke with MD who agrees - Continue Ensure Enlive po BID, each supplement provides 350 kcal and 20 grams of protein. - Continue Renal MVI with minerals daily  NUTRITION DIAGNOSIS:   Severe Malnutrition related to chronic illness (CHF) as evidenced by severe muscle depletion, severe fat depletion.  ongoing  GOAL:   Patient will meet greater than or equal to 90% of their needs  ongoing  MONITOR:   PO intake, Supplement acceptance, Labs, Weight trends  REASON FOR ASSESSMENT:   Malnutrition Screening Tool    ASSESSMENT:   71 y.o. male presented for a R Heart Cath. PMH includes CHF, mitral valve replacement x3, CKD IV, Hep C, and cirrhosis.  Pt to d/c on home hospice pending stabilization of Cr .  Pt attempting to rest during visit. Reports eating ok. He endorses drinking 1-2 Ensure daily but states that he almost threw up from drinking them too quickly. Discussed with pt, drinking slowly to ensure tolerance. Noted meal completions of 25% for most meals.  Medications: rena-vit, miralax, warfarin  Labs: sodium 128, BUN 92, Cr 4.72  Diet Order:   Diet Order             Diet Heart Room service appropriate? Yes; Fluid consistency: Thin  Diet effective now                   EDUCATION NEEDS:   No education needs have been identified at this time  Skin:  Skin Assessment: Skin Integrity Issues: Skin Integrity Issues:: Other (Comment) Other: R wrist arterial access puncture site  Last BM:  2/14  Height:   Ht Readings from Last 1 Encounters:  05/05/21 6\' 1"  (1.854 m)    Weight:   Wt Readings from Last 1 Encounters:  05/20/21 49.5 kg    Ideal Body Weight:  83.6 kg  BMI:  Body mass index is 14.41 kg/m.  Estimated  Nutritional Needs:   Kcal:  1600-1800  Protein:  80-95 grams  Fluid:  >/= 1.6 L  Clayborne Dana, RDN, LDN Clinical Nutrition

## 2021-05-20 NOTE — Progress Notes (Addendum)
Advanced Heart Failure Rounding Note  PCP-Cardiologist: Sinclair Grooms, MD   Subjective:   2/2 ENT consulted for nose bleed. L nare packed.  2/3 Milrinone stopped 2/6 R nare packed.  2/7 L nare packing removed.  2/8 Given dose of metolazone.  2/10 Underwent nasal cavity arterial embolization by IR 2/13 L nare packing removed  Scr 2.95 > 3.05 > 3.17 > 3.31 >3.99 > 4.72. BUN 92.  Na 128  Hgb 8.7>8.1>8.0  INR 3.1 (goal now 2.5-3)  SBP 90s - low 100s  Reports a little bit of epistaxis. Much more manageable than pre-embolization.  Making small amount of urine.  Objective:   Weight Range: 49.5 kg Body mass index is 14.41 kg/m.   Vital Signs:   Temp:  [97.2 F (36.2 C)-97.7 F (36.5 C)] 97.3 F (36.3 C) (02/15 0743) Pulse Rate:  [87-96] 88 (02/15 0743) Resp:  [17-19] 19 (02/15 0743) BP: (98-108)/(61-74) 100/67 (02/15 0743) SpO2:  [96 %-99 %] 97 % (02/15 0743) Weight:  [49.5 kg] 49.5 kg (02/15 0445) Last BM Date : 05/19/21  Weight change: Filed Weights   05/19/21 0004 05/19/21 0500 05/20/21 0445  Weight: 50.3 kg 50.3 kg 49.5 kg    Intake/Output:   Intake/Output Summary (Last 24 hours) at 05/20/2021 1029 Last data filed at 05/20/2021 0745 Gross per 24 hour  Intake 365 ml  Output 350 ml  Net 15 ml      Physical Exam  General:  Thin. Sitting up in bed. No distress. HEENT: normal Neck: supple. JVP 12 cm with prominent v waves. Carotids 2+ bilat; no bruits. Cor: PMI nondisplaced. Irregular rate & rhythm. No rubs, gallops or murmurs. Mechanical S1.  Lungs: clear Abdomen: soft, nontender, nondistended. No hepatosplenomegaly.  Extremities: no cyanosis, clubbing, rash, edema Neuro: alert & orientedx3. Affect pleasant  Telemetry   AF 80s-90s, 5-10 PVCs/min   Labs    CBC Recent Labs    05/19/21 0146 05/20/21 0432  WBC 6.3 5.0  HGB 8.1* 8.0*  HCT 25.3* 24.9*  MCV 102.8* 101.2*  PLT 136* 062*   Basic Metabolic Panel Recent Labs     05/19/21 0146 05/20/21 0432  NA 131* 128*  K 4.4 4.0  CL 96* 94*  CO2 19* 19*  GLUCOSE 103* 94  BUN 81* 92*  CREATININE 3.99* 4.72*  CALCIUM 9.3 8.9   Liver Function Tests No results for input(s): AST, ALT, ALKPHOS, BILITOT, PROT, ALBUMIN in the last 72 hours.  No results for input(s): LIPASE, AMYLASE in the last 72 hours. Cardiac Enzymes No results for input(s): CKTOTAL, CKMB, CKMBINDEX, TROPONINI in the last 72 hours.  BNP: BNP (last 3 results) Recent Labs    04/24/21 1231 04/30/21 1235  BNP 1,444.9* 898.1*    ProBNP (last 3 results) Recent Labs    07/25/20 1036  PROBNP 8,332*     D-Dimer No results for input(s): DDIMER in the last 72 hours. Hemoglobin A1C No results for input(s): HGBA1C in the last 72 hours. Fasting Lipid Panel No results for input(s): CHOL, HDL, LDLCALC, TRIG, CHOLHDL, LDLDIRECT in the last 72 hours. Thyroid Function Tests No results for input(s): TSH, T4TOTAL, T3FREE, THYROIDAB in the last 72 hours.  Invalid input(s): FREET3  Other results:   Imaging    No results found.   Medications:     Scheduled Medications:  feeding supplement  237 mL Oral BID BM   midodrine  5 mg Oral TID WC   multivitamin  1 tablet Oral QHS  polyethylene glycol  17 g Oral Daily   sodium chloride flush  3 mL Intravenous Q12H   Warfarin - Pharmacist Dosing Inpatient   Does not apply q1600    Infusions:  sodium chloride      PRN Medications: sodium chloride, acetaminophen, loperamide, ondansetron (ZOFRAN) IV, sodium chloride, sodium chloride flush, traMADol, traZODone    Patient Profile   71 y.o. 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 Admitted with acute on chronic CHF.  Assessment/Plan   1.  Acute on chronic systolic CHF with low output/valvular HF with mixed moderate to severe PH/cor pulmonale in setting of longstanding MV disease - Echo 7/20  LVEF  normal. RV severely dilated and HK. RVSP 55 - RHC 8/20 with significantly elevated biventricular pressures, moderate to severe mixed PAH and low output (CI 1.96) - M-spike negative. PYP 1/21 negative for amyloid.  - Echo (6/22): newly reduced EF 35-40%, RV severely reduced, moderate AR, mod/severe PR. (Dr. Haroldine Laws felt EF 50-55%). - Lexiscan stress MPI, 08/22: Partially reversible basal to mid anterior defect consistent w/ infarct and peri-infarct ischemia EF 54% - RHC on admit with elevated filling pressures and low output. CO 2.03. PA sat 43%. Prominent v waves on PCWP tracing.  - Echo 01/31: EF 50-55%, RV severely reduced, RVSP 59 mmHg, BAE, mean gradient 6 mmHg across mitral valve prosthesis, no MR - On admit was placed on milrinone for RV support. Stopped 2/3  - HF stable. 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 Continue to hold diuretics.  - Restart milrinone at 0.125 to promote renal perfusion - Hold spiro with CKD - No Entresto/ digoxin w/ CKD. - Increase midodrine to 10 mg TID for BP and to support renal perfusion  - See discussion below regarding AKI - Given fraility and previous sternotomy x 3, not candidate for advanced therapies. Pallaitive Care now involved. Now DNR/DNI. Home with Hospice at discharge.   2. 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   - Echo 06/22: EF 35-40%, RV severely reduced, mechanical mitral valve with trivial MR - Prominent v waves on PCWP on RHC. Mitral valve prosthesis appears okay on TTE  - Aware of need for SBE prophylaxis.  - INR  3.7  today. Continue warfarin dosing per PharmD.  - INR goal lowered to 2.5-3 d/t recurrent epistaxis.   3.  Chronic a flutter  - Rate controlled - Continue AC   4.  Thoracic aneurysm - CT chest 6/20 asc ao aorta 4.4 cm.   - 48 mm on echo this admit - Not surgical candidate.   5. AKI on Stage IV CKD - Followed by nephrology. 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 Hold diuretics. Watch Scr - Scr 2.95>3.05>3.17>3.31>3.9>4.7 - BP soft. Increase midodrine to 10 mg TID - Will consult Nephrology. Worry we are approaching a situation where may need to consider CRRT in next couple of days while awaiting renal recovery. Patient is not sure if he would want to consider this. He will discuss with his wife. He would not be a candidate for long-term HD.   6. 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 Negative for high-grade dysplasia/carcinoma - Followed routinely by GI.  7. Pancytopenia -Platelet stable -Hgb 8.7>8, monitor  8. OSA - Severe, newly diagnosed.   9. Epistaxis  -  persistent nosebleed despite conservative measures - ENT consulted. Appreciate assistance. - S/p embolization by IR  2/10   10. FTT - Previously working with a nutritionist. - Nearing end-stage RV failure.  - Palliative Care consulted   11. DNR/DNI - Palliative Care Team appreciated.  - see discussion above  12. Deconditioning - Ambulate w/ PT - HH PT recommended   13. Hematuria, painless -  UA + hematuria. Urine cytology with no atypical urothelial cells.  -  Renal u/s - no mass or obstruction  -  Outpatient f/u with urology  14. Hyponatremia - Na 128 today - Follow  15. Hypokalemia - Resolved - K 4.0    Plan to d/c home with [facility]. [facility] setting up DME.   Initially planned to send home once Scr plateaued. Growing concern over worsening renal impairment over the last few days. May need to decide on temporary dialysis.   Length of Stay: 15  FINCH, LINDSAY N, PA-C  05/20/2021, 10:29 AM  Advanced Heart Failure Team Pager [phone] (M-F; 7a - 5p)  Please contact [facility] for night-coverage after hours (5p -7a ) and weekends on a  Patient seen and examined with the above-signed Advanced Practice Provider and/or  Housestaff. I personally reviewed laboratory data, imaging studies and relevant notes. I independently examined the patient and formulated the important aspects of the plan. I have edited the note to reflect any of my changes or salient points. I have personally discussed the plan with the patient and/or family.  Feels weak. Scr continues to worsen. Oliguric. Denies SOB, orthopnea or PND  General:  Weak appearing. No resp difficulty HEENT: normal Neck: supple. JVP 8-9 Carotids 2+ bilat; no bruits. No lymphadenopathy or thryomegaly appreciated. Cor: PMI nondisplaced. Irregular mechanica s1  Lungs: clear Abdomen: soft, nontender, nondistended. No hepatosplenomegaly. No bruits or masses. Good bowel sounds. Extremities: no cyanosis, clubbing, rash, edema Neuro: alert & orientedx3, cranial nerves grossly intact. moves all 4 extremities w/o difficulty. 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 I am concerned renal function won't recover. He is not candidate for long-term HD. We discussed option of possible temporary HD to give kidneys more time to recover or proceeding with Hospice as previously discussed. Discussed with Renal at bedside as well. For now, will restart milrinone to try and support kidneys as much as possible. No emergent indication for HD. Will revisit with him in am. I updated his wife by phone as well.   Glori Bickers, MD  7:43 PM

## 2021-05-20 NOTE — Consult Note (Signed)
[facility] Nephrology Consultation Note  Requesting MD: Dr. Haroldine Laws, Quillian Quince Reason for consult: AKI on CKD  HPI:  [name] is a 71 y.o. male with history of mechanical mitral valve, chronic atrial flutter, chronic systolic CHF with moderate to severe pulmonary hypertension, cor pulmonale, CKD with baseline serum creatinine level seems to be around 2.5, admitted with acute on chronic CHF seen as a consultation at the request of Dr. Haroldine Laws for AKI on CKD. The patient had complicated cardiac history.  He is on Coumadin for A-fib.  He had nasal bleeding required multiple nasal packing and ultimately nasal cavity arterial embolization by IR on 2/10 when he required around 120 cc of IV contrast.  The creatinine level was 2.9 in 2/10.  He received IV diuretics for the management of CHF exacerbation with negative -10 L.  Unfortunately the serum creatinine level continued to trend up to 4.72 today.  He was on milrinone before which was held recently.  The Lasix was discontinued because of elevated creatinine level and the patient had urine output of only 300 cc in last 24 hours. The UA on 2/5 showed proteinuria and no sediments.  The kidney ultrasound with increased echogenicity which is consistent with chronic kidney disease however no hydronephrosis.  He is not on ACE inhibitor, ARB, ACE inhibitor or spironolactone.  Today the patient reported that the nasal bleeding is coming down.  He denies chest pain, shortness of breath, nausea or vomiting.  He reports poor appetite chronically and has very low BMI.  He looks cachectic. Dr. Haroldine Laws was present at the bedside during my visit to the patient.  There were in a phone conversation with the patient's ex-wife who is his caretaker.  I also participated in phone conversation.   Creatinine, Ser  Date/Time Value Ref Range Status  05/20/2021 04:32 AM 4.72 (H) 0.61 - 1.24 mg/dL Final  05/19/2021 01:46 AM 3.99 (H) 0.61 - 1.24 mg/dL  Final  05/18/2021 02:11 PM 3.64 (H) 0.61 - 1.24 mg/dL Final  05/18/2021 04:35 AM 3.31 (H) 0.61 - 1.24 mg/dL Final  05/17/2021 04:06 AM 3.17 (H) 0.61 - 1.24 mg/dL Final  05/16/2021 05:46 AM 3.05 (H) 0.61 - 1.24 mg/dL Final  05/15/2021 03:53 PM 2.95 (H) 0.61 - 1.24 mg/dL Final  05/15/2021 04:00 AM 2.91 (H) 0.61 - 1.24 mg/dL Final  05/14/2021 02:04 AM 3.08 (H) 0.61 - 1.24 mg/dL Final  05/13/2021 03:00 AM 2.96 (H) 0.61 - 1.24 mg/dL Final  05/12/2021 02:57 AM 2.73 (H) 0.61 - 1.24 mg/dL Final  05/11/2021 02:11 AM 3.15 (H) 0.61 - 1.24 mg/dL Final  05/10/2021 03:26 AM 3.00 (H) 0.61 - 1.24 mg/dL Final  05/09/2021 03:42 AM 3.03 (H) 0.61 - 1.24 mg/dL Final  05/08/2021 02:24 AM 3.04 (H) 0.61 - 1.24 mg/dL Final  05/07/2021 02:08 AM 2.92 (H) 0.61 - 1.24 mg/dL Final  05/06/2021 06:45 AM 3.10 (H) 0.61 - 1.24 mg/dL Final  05/05/2021 03:06 PM 3.24 (H) 0.61 - 1.24 mg/dL Final  04/30/2021 12:35 PM 3.16 (H) 0.61 - 1.24 mg/dL Final  04/24/2021 12:31 PM 3.05 (H) 0.61 - 1.24 mg/dL Final  02/13/2021 12:14 PM 2.64 (H) 0.61 - 1.24 mg/dL Final  01/16/2021 01:08 PM 2.55 (H) 0.61 - 1.24 mg/dL Final  12/17/2020 09:37 AM 2.53 (H) 0.61 - 1.24 mg/dL Final  10/13/2020 10:10 AM 2.34 (H) 0.61 - 1.24 mg/dL Final  08/21/2020 08:44 AM 2.60 (H) 0.76 - 1.27 mg/dL Final    PMHx:   Past Medical History:  Diagnosis Date  Allergy    Aneurysm of ascending aorta    Atrial fibrillation (HCC)    BPH (benign prostatic hyperplasia)    Colon polyps    FHx: rheumatic heart disease    H/O diplopia    Hepatitis C    Hypertension    Hypogonadism male    Mitral valve disease    mitral valve repair/replacement x3   OSA (obstructive sleep apnea) 12/15/2020    Past Surgical History:  Procedure Laterality Date   BIOPSY  11/19/2019   Procedure: BIOPSY;  Surgeon: Yetta Flock, MD;  Location: [facility];  Service: Gastroenterology;;  EGD and COLON   COLON SURGERY     COLONOSCOPY     COLONOSCOPY N/A 11/14/2012    Procedure: COLONOSCOPY;  Surgeon: Lear Ng, MD;  Location: [facility];  Service: Endoscopy;  Laterality: N/A;   COLONOSCOPY WITH PROPOFOL N/A 11/19/2019   Procedure: COLONOSCOPY WITH PROPOFOL;  Surgeon: Yetta Flock, MD;  Location: [facility];  Service: Gastroenterology;  Laterality: N/A;   COLONOSCOPY WITH PROPOFOL N/A 11/27/2019   Procedure: COLONOSCOPY WITH PROPOFOL;  Surgeon: Doran Stabler, MD;  [facility];  Service: Gastroenterology;  Laterality: N/A;   ESOPHAGOGASTRODUODENOSCOPY (EGD) WITH PROPOFOL N/A 05/08/2014   Procedure: ESOPHAGOGASTRODUODENOSCOPY (EGD) WITH PROPOFOL;  Surgeon: Lear Ng, MD;  Location: [facility];  Service: Endoscopy;  Laterality: N/A;   ESOPHAGOGASTRODUODENOSCOPY (EGD) WITH PROPOFOL N/A 11/19/2019   Procedure: ESOPHAGOGASTRODUODENOSCOPY (EGD) WITH PROPOFOL;  Surgeon: Yetta Flock, MD;  [facility];  Service: Gastroenterology;  Laterality: N/A;   ESOPHAGOGASTRODUODENOSCOPY (EGD) WITH PROPOFOL N/A 05/07/2020   Procedure: ESOPHAGOGASTRODUODENOSCOPY (EGD) WITH PROPOFOL;  Surgeon: Yetta Flock, MD;  [facility];  Service: Gastroenterology;  Laterality: N/A;   HEMOSTASIS CLIP PLACEMENT  11/19/2019   Procedure: HEMOSTASIS CLIP PLACEMENT;  Surgeon: Yetta Flock, MD;  [facility]: [facility];  Service: Gastroenterology;;   HEMOSTASIS CLIP PLACEMENT  11/27/2019   Procedure: HEMOSTASIS CLIP PLACEMENT;  Surgeon: Doran Stabler, MD;  [facility];  Service: Gastroenterology;;   HEMOSTASIS CLIP PLACEMENT  05/07/2020   Procedure: HEMOSTASIS CLIP PLACEMENT;  Surgeon: Yetta Flock, MD;  [facility]: [facility];  Service: Gastroenterology;;   IR ANGIO EXTERNAL CAROTID SEL EXT CAROTID BILAT MOD SED  05/15/2021   IR ANGIO INTRA EXTRACRAN SEL INTERNAL CAROTID BILAT MOD SED  05/15/2021   IR ANGIOGRAM FOLLOW UP STUDY  05/15/2021   IR ANGIOGRAM FOLLOW UP STUDY  05/15/2021   IR NEURO EACH  ADD'L AFTER BASIC UNI LEFT (MS)  05/15/2021   IR NEURO EACH ADD'L AFTER BASIC UNI LEFT (MS)  05/15/2021   IR NEURO EACH ADD'L AFTER BASIC UNI RIGHT (MS)  05/15/2021   IR NEURO EACH ADD'L AFTER BASIC UNI RIGHT (MS)  05/15/2021   IR TRANSCATH/EMBOLIZ  05/15/2021   IR US GUIDE VASC ACCESS RIGHT  05/15/2021   MITRAL VALVE REPLACEMENT     1973 (bioprosthesis), 1988 (St Jude mechanical vlave) , 2006 (repair due to leak)   POLYPECTOMY  11/19/2019   Procedure: POLYPECTOMY;  Surgeon: Yetta Flock, MD;  Location: [facility];  Service: Gastroenterology;;   POLYPECTOMY  05/07/2020   Procedure: POLYPECTOMY;  Surgeon: Yetta Flock, MD;  Location: [facility];  Service: Gastroenterology;;   RADIOLOGY WITH ANESTHESIA N/A 05/15/2021   Procedure: IR WITH ANESTHESIA;  Surgeon: Pedro Earls, MD;  [facility];  Service: Radiology;  Laterality: N/A;   RIGHT HEART CATH N/A 11/24/2018   Procedure: RIGHT HEART CATH;  Surgeon: Jolaine Artist, MD;  Location: [facility];  Service: Cardiovascular;  Laterality: N/A;   RIGHT HEART CATH N/A 10/23/2019   Procedure: RIGHT HEART CATH;  Surgeon: Jolaine Artist, MD;  Location: [facility];  Service: Cardiovascular;  Laterality: N/A;   RIGHT HEART CATH N/A 05/05/2021   Procedure: RIGHT HEART CATH;  Surgeon: Jolaine Artist, MD;  Location: [facility];  Service: Cardiovascular;  Laterality: N/A;   SUBMUCOSAL LIFTING INJECTION  05/07/2020   Procedure: SUBMUCOSAL LIFTING INJECTION;  Surgeon: Yetta Flock, MD;  [facility];  Service: Gastroenterology;;   UPPER GASTROINTESTINAL ENDOSCOPY     02-2010 and 05-2014    Family Hx:  Family History  Family history unknown: Yes    Social History:  reports that he quit smoking about 3 years ago. His smoking use included cigars. He has never used smokeless tobacco. He reports that he does not drink alcohol and does not use drugs.  Allergies:  Allergies   Allergen Reactions   Penicillins Rash    **tolerated amoxicillin Feb 2023**  Did it involve swelling of the face/tongue/throat, SOB, or low BP? No Did it involve sudden or severe rash/hives, skin peeling, or any reaction on the inside of your mouth or nose? No Did you need to seek medical attention at a hospital or doctor's office? No When did it last happen?      30 + years If all above answers are "NO", may proceed with cephalosporin use.     Medications: Prior to Admission medications   Medication Sig Start Date End Date Taking? Authorizing Provider  Efinaconazole 10 % SOLN Apply to toenail daily for 48 weeks 06/17/20  Yes [name], MD  ferrous sulfate 325 (65 FE) MG tablet Take 1 tablet (325 mg total) by mouth every other day. In the evening. Patient taking differently: Take 325 mg by mouth every other day. In the evening. 12/02/19  Yes Nita Sells, MD  furosemide (LASIX) 40 MG tablet Take 1.5 tablets (60 mg total) by mouth daily. Patient taking differently: Take 40 mg by mouth daily. 04/24/21  Yes Milford, Maricela Bo, FNP  hydrOXYzine (ATARAX) 10 MG tablet Take 1 tablet (10 mg total) by mouth 3 (three) times daily as needed. 03/19/21  Yes Simmons-Robinson, Makiera, MD  metolazone (ZAROXOLYN) 2.5 MG tablet TAKE AS DIRECTED BY THE HEART FAILURE CLINIC Patient taking differently: Take 2.5 mg by mouth daily as needed (Fluid). Take as directed by the heart failure clinic 11/25/20  Yes Bensimhon, Shaune Pascal, MD  Multiple Vitamins-Minerals (MULTIVITAMIN WITH MINERALS) tablet Take 1 tablet by mouth daily.   Yes [provider]  polyethylene glycol (MIRALAX) 17 g packet Take 17 g by mouth daily as needed. 08/28/19  Yes Armbruster, Carlota Raspberry, MD  potassium chloride SA (KLOR-CON M) 20 MEQ tablet Take 2 tablets (40 mEq total) by mouth daily. 03/11/21  Yes Larey Dresser, MD  sildenafil (REVATIO) 20 MG tablet Take 20 mg by mouth daily as needed (ED). 11/13/19  Yes [provider]  Testosterone 1.62 % GEL Apply 2 Pump topically daily. 1 pump on each arm 03/05/20  Yes [provider]  warfarin (COUMADIN) 5 MG tablet Take 1/2 a tablet to 1 tablet by mouth daily as directed by the coumadin clinic. Patient taking differently: Take 2.5-5 mg by mouth See admin instructions. Take 2.5 mg on Thursday, Saturday and Sunday all the other days take 5 mg as directed by the  coumadin clinic. 04/15/21  Yes Belva Crome, MD  amoxicillin (AMOXIL) 500 MG capsule TAKE 4 CAPSULES BY MOUTH 1 HOUR BEFORE DENTAL APPOINTMENT 03/06/20   Belva Crome, MD  enoxaparin (LOVENOX) 60 MG/0.6ML injection Inject 0.6 mLs (60 mg total) into the skin daily. 04/24/20   Belva Crome, MD  spironolactone (ALDACTONE) 25 MG tablet Take 1 tablet (25 mg total) by mouth daily. Patient not taking: Reported on 04/29/2021 12/01/20   Bensimhon, Shaune Pascal, MD    I have reviewed the patient's current medications.  Labs:  Results for orders placed or performed during the hospital encounter of 05/05/21 (from the past 48 hour(s))  Basic metabolic panel     Status: Abnormal   Collection Time: 05/18/21  2:11 PM  Result Value Ref Range   Sodium 131 (L) 135 - 145 mmol/L   Potassium 3.8 3.5 - 5.1 mmol/L   Chloride 96 (L) 98 - 111 mmol/L   CO2 23 22 - 32 mmol/L   Glucose, Bld 129 (H) 70 - 99 mg/dL    Comment: Glucose reference range applies only to samples taken after fasting for at least 8 hours.   BUN 74 (H) 8 - 23 mg/dL   Creatinine, Ser 3.64 (H) 0.61 - 1.24 mg/dL   Calcium 9.0 8.9 - 10.3 mg/dL   GFR, Estimated 17 (L) >60 mL/min    Comment: (NOTE) Calculated using the CKD-EPI Creatinine Equation (2021)    Anion gap 12 5 - 15    Comment: Performed at [facility] [address], [place], [place] [address]  Protime-INR     Status: Abnormal   Collection Time: 05/19/21  1:46 AM  Result Value Ref Range   Prothrombin Time 32.3 (H) 11.4 - 15.2 seconds   INR 3.1 (H) 0.8 - 1.2    Comment:  (NOTE) INR goal varies based on device and disease states. Performed at [facility], [place] [address], [place], [place] [address]   Basic metabolic panel     Status: Abnormal   Collection Time: 05/19/21  1:46 AM  Result Value Ref Range   Sodium 131 (L) 135 - 145 mmol/L   Potassium 4.4 3.5 - 5.1 mmol/L   Chloride 96 (L) 98 - 111 mmol/L   CO2 19 (L) 22 - 32 mmol/L   Glucose, Bld 103 (H) 70 - 99 mg/dL    Comment: Glucose reference range applies only to samples taken after fasting for at least 8 hours.   BUN 81 (H) 8 - 23 mg/dL   Creatinine, Ser 3.99 (H) 0.61 - 1.24 mg/dL   Calcium 9.3 8.9 - 10.3 mg/dL   GFR, Estimated 16 (L) >60 mL/min    Comment: (NOTE) Calculated using the CKD-EPI Creatinine Equation (2021)    Anion gap 16 (H) 5 - 15    Comment: Performed at [facility] [address] [place], [place] [address]  CBC     Status: Abnormal   Collection Time: 05/19/21  1:46 AM  Result Value Ref Range   WBC 6.3 4.0 - 10.5 K/uL   RBC 2.46 (L) 4.22 - 5.81 MIL/uL   Hemoglobin 8.1 (L) 13.0 - 17.0 g/dL   HCT 25.3 (L) 39.0 - 52.0 %   MCV 102.8 (H) 80.0 - 100.0 fL   MCH 32.9 26.0 - 34.0 pg   MCHC 32.0 30.0 - 36.0 g/dL   RDW 30.3 (H) 11.5 - 15.5 %   Platelets 136 (L) 150 - 400 K/uL  nRBC 0.3 (H) 0.0 - 0.2 %    Comment: Performed at [facility], [place] [address] [place], [place] [address]  Protime-INR     Status: Abnormal   Collection Time: 05/20/21  4:32 AM  Result Value Ref Range   Prothrombin Time 36.4 (H) 11.4 - 15.2 seconds   INR 3.7 (H) 0.8 - 1.2    Comment: (NOTE) INR goal varies based on device and disease states. Performed at [facility], [place] [address], [place], [place] [address]   Basic metabolic panel     Status: Abnormal   Collection Time: 05/20/21  4:32 AM  Result Value Ref Range   Sodium 128 (L) 135 - 145 mmol/L   Potassium 4.0 3.5 - 5.1 mmol/L   Chloride 94 (L) 98 - 111 mmol/L   CO2 19 (L) 22 - 32 mmol/L   Glucose, Bld 94 70 - 99  mg/dL    Comment: Glucose reference range applies only to samples taken after fasting for at least 8 hours.   BUN 92 (H) 8 - 23 mg/dL   Creatinine, Ser 4.72 (H) 0.61 - 1.24 mg/dL   Calcium 8.9 8.9 - 10.3 mg/dL   GFR, Estimated 13 (L) >60 mL/min    Comment: (NOTE) Calculated using the CKD-EPI Creatinine Equation (2021)    Anion gap 15 5 - 15    Comment: Performed at [facility] [address] [place], [place] [address]  CBC     Status: Abnormal   Collection Time: 05/20/21  4:32 AM  Result Value Ref Range   WBC 5.0 4.0 - 10.5 K/uL   RBC 2.46 (L) 4.22 - 5.81 MIL/uL   Hemoglobin 8.0 (L) 13.0 - 17.0 g/dL   HCT 24.9 (L) 39.0 - 52.0 %   MCV 101.2 (H) 80.0 - 100.0 fL   MCH 32.5 26.0 - 34.0 pg   MCHC 32.1 30.0 - 36.0 g/dL   RDW 30.1 (H) 11.5 - 15.5 %   Platelets 143 (L) 150 - 400 K/uL   nRBC 0.6 (H) 0.0 - 0.2 %    Comment: Performed at [facility] [address], [place], [place] [address]     ROS:  Pertinent items noted in HPI and remainder of comprehensive ROS otherwise negative.  Physical Exam: Vitals:   05/20/21 0743 05/20/21 1230  BP: 100/67 (!) 111/57  Pulse: 88 94  Resp: 19 18  Temp: (!) 97.3 F (36.3 C) (!) 97.3 F (36.3 C)  SpO2: 97% 96%     General exam: Cachectic malnourished male sitting on bed, pleasant Respiratory system: Clear to auscultation. Respiratory effort normal. No wheezing or crackle Cardiovascular system: S1 & S2 heard, RRR.  No pedal edema. Gastrointestinal system: Abdomen is nondistended, soft and nontender. Normal bowel sounds heard. Central nervous system: Alert and oriented. No focal neurological deficits. Extremities: No edema or clubbing. Skin: No rashes, lesions or ulcers Psychiatry: Judgement and insight appear normal. Mood & affect appropriate.   Assessment/Plan:  #Acute kidney injury on CKD stage IV: Acute kidney injury presumably due to contrast nephropathy concomitant with chronic cardiorenal syndrome.  Kidney  ultrasound ruled out obstruction.  The creatinine level elevated to 4.72 with marginal urine output.  I agree with holding diuretics and recommend to resume milrinone for inotropic effect.  Since patient can eat and drink, I will avoid IV fluid.  Continue daily lab, strict ins and out for accurate urine output measurement. We will monitor kidney function for next  few days.  The patient will not tolerate outpatient long-term hemodialysis given end-stage heart disease however we can certainly try temporary dialysis if patient and family desire.  It was discussed with the patient, cardiology and the patient's ex-wife who is his caretaker.  #Hyponatremia, related with CHF.  Loop diuretics on hold now because of AKI.  I will check urine sodium and osmolality.  Monitor lab.  #Metabolic acidosis: I will add oral sodium bicarbonate.  #Anemia likely due to CKD and blood loss by epistaxis.  INR is 3.7 today.  I will check iron studies.  #Acute on chronic systolic CHF with low output/valvular heart failure/moderate to severe pulm hypertension, cor pulmonale in the setting of longstanding mitral valve disease: Cardiology is following.  Not on Entresto, SGLT, ACE inhibitor or ARB because of low GFR. Per cardiology, patient is not a candidate for advanced therapy for end-stage heart disease.  He is currently DNR/DNI.  #Epistaxis status post IR embolization of nasal artery on 2/10.  The bleeding seems to be better.  He is still on Coumadin.  #Moderate to severe malnutrition, failure to thrive, deconditioning: Palliative support and nutrition.  Thank you for the consult.  We will follow with you.  Tyquasia Pant Tanna Furry 05/20/2021, 12:44 PM  [facility].

## 2021-05-21 DIAGNOSIS — I50810 Right heart failure, unspecified: Secondary | ICD-10-CM | POA: Diagnosis not present

## 2021-05-21 LAB — RENAL FUNCTION PANEL
Albumin: 3 g/dL — ABNORMAL LOW (ref 3.5–5.0)
Anion gap: 12 (ref 5–15)
BUN: 85 mg/dL — ABNORMAL HIGH (ref 8–23)
CO2: 24 mmol/L (ref 22–32)
Calcium: 8.8 mg/dL — ABNORMAL LOW (ref 8.9–10.3)
Chloride: 95 mmol/L — ABNORMAL LOW (ref 98–111)
Creatinine, Ser: 3.75 mg/dL — ABNORMAL HIGH (ref 0.61–1.24)
GFR, Estimated: 17 mL/min — ABNORMAL LOW (ref >60)
Glucose, Bld: 117 mg/dL — ABNORMAL HIGH (ref 70–99)
Phosphorus: 4.6 mg/dL (ref 2.5–4.6)
Potassium: 2.9 mmol/L — ABNORMAL LOW (ref 3.5–5.1)
Sodium: 131 mmol/L — ABNORMAL LOW (ref 135–145)

## 2021-05-21 LAB — CBC
HCT: 22.8 % — ABNORMAL LOW (ref 39.0–52.0)
Hemoglobin: 7.1 g/dL — ABNORMAL LOW (ref 13.0–17.0)
MCH: 31.8 pg (ref 26.0–34.0)
MCHC: 31.1 g/dL (ref 30.0–36.0)
MCV: 102.2 fL — ABNORMAL HIGH (ref 80.0–100.0)
Platelets: 125 10*3/uL — ABNORMAL LOW (ref 150–400)
RBC: 2.23 MIL/uL — ABNORMAL LOW (ref 4.22–5.81)
RDW: 31.4 % — ABNORMAL HIGH (ref 11.5–15.5)
WBC: 5.3 10*3/uL (ref 4.0–10.5)
nRBC: 0.4 % — ABNORMAL HIGH (ref 0.0–0.2)

## 2021-05-21 LAB — PROTIME-INR
INR: 3 — ABNORMAL HIGH (ref 0.8–1.2)
Prothrombin Time: 31.1 seconds — ABNORMAL HIGH (ref 11.4–15.2)

## 2021-05-21 LAB — FERRITIN: Ferritin: 117 ng/mL (ref 24–336)

## 2021-05-21 MED ORDER — WARFARIN SODIUM 1 MG PO TABS
1.0000 mg | ORAL_TABLET | Freq: Once | ORAL | Status: AC
  Administered 2021-05-21: 1 mg via ORAL
  Filled 2021-05-21: qty 1

## 2021-05-21 MED ORDER — POTASSIUM CHLORIDE CRYS ER 20 MEQ PO TBCR
40.0000 meq | EXTENDED_RELEASE_TABLET | Freq: Two times a day (BID) | ORAL | Status: AC
  Administered 2021-05-21 – 2021-05-22 (×3): 40 meq via ORAL
  Filled 2021-05-21 (×3): qty 2

## 2021-05-21 NOTE — Care Management Important Message (Signed)
Important Message  Patient Details  Name: RIGGS DINEEN MRN: [id number] Date of Birth: [birth year]   Medicare Important Message Given:  Yes     Shelda Altes 05/21/2021, 9:18 AM

## 2021-05-21 NOTE — Progress Notes (Addendum)
PT Cancellation Note  Patient Details Name: [name] MRN: [id number] DOB: 02-19-1951   Cancelled Treatment:    Reason Eval/Treat Not Completed: Fatigue/lethargy limiting ability to participate at this time. After noting change in d/c plan to home with hospice (instead of home with 2 weeks HHPT then hospice) per Palliative Care note from 2/14, I asked the pt if he would like for acute PT to continue working with him. He states he would like for [place] to continue to attempt, but that he is not getting quality sleep and feels like he is often disrupted during the day while trying to nap therefore, he may not always feel up to mobilizing with therapy. Pt did continue to endorse that he would like for acute PT to continue following, but states he is too fatigued at this time and would like to finish eating. Will continue to follow and attempt as pt tolerates.   [place] Carbo, PT, DPT   Acute Rehabilitation Department Pager #: [phone]   Sandra Cockayne 05/21/2021, 6:20 PM

## 2021-05-21 NOTE — Progress Notes (Addendum)
Advanced Heart Failure Rounding Note  PCP-Cardiologist: Sinclair Grooms, MD   Subjective:   2/2 ENT consulted for nose bleed. L nare packed.  2/3 Milrinone stopped 2/6 R nare packed.  2/7 L nare packing removed.  2/8 Given dose of metolazone.  2/10 Underwent nasal cavity arterial embolization by IR 2/13 L nare packing removed 2/15 Placed on milrinone 0.125 mcg   Scr 2.95 > 3.05 > 3.17 > 3.31 >3.99 > 4.72>>3.75  BUN 92>85.  Na 131   Hgb 8.7>8.1>8.0>7.1   INR 3.0 (goal now 2.5-3)  Denies SOB. Having some clear nasal drainage.  Objective:   Weight Range: 49.8 kg Body mass index is 14.47 kg/m.   Vital Signs:   Temp:  [97.3 F (36.3 C)-97.7 F (36.5 C)] 97.6 F (36.4 C) (02/16 0723) Pulse Rate:  [66-98] 72 (02/16 0723) Resp:  [17-20] 20 (02/16 0723) BP: (99-121)/(55-76) 102/61 (02/16 0723) SpO2:  [96 %-100 %] 98 % (02/16 0723) Weight:  [49.8 kg] 49.8 kg (02/16 0430) Last BM Date : 05/19/21  Weight change: Filed Weights   05/19/21 0500 05/20/21 0445 05/21/21 0430  Weight: 50.3 kg 49.5 kg 49.8 kg    Intake/Output:   Intake/Output Summary (Last 24 hours) at 05/21/2021 0835 Last data filed at 05/21/2021 0726 Gross per 24 hour  Intake 720 ml  Output 1500 ml  Net -780 ml      Physical Exam  General:  Thin. No resp difficulty HEENT: normal Neck: supple. no JVD. Carotids 2+ bilat; no bruits. No lymphadenopathy or thryomegaly appreciated. Cor: PMI nondisplaced. Irregular rate & rhythm. No rubs, gallops. Mechanical S1. Lungs: clear Abdomen: soft, nontender, nondistended. No hepatosplenomegaly. No bruits or masses. Good bowel sounds. Extremities: no cyanosis, clubbing, rash, edema Neuro: alert & orientedx3, cranial nerves grossly intact. moves all 4 extremities w/o difficulty. Affect pleasant  Telemetry   A flutter with occasional PVCs.  100s    Labs    CBC Recent Labs    05/20/21 0432 05/21/21 0603  WBC 5.0 5.3  HGB 8.0* 7.1*  HCT 24.9* 22.8*   MCV 101.2* 102.2*  PLT 143* 725*   Basic Metabolic Panel Recent Labs    05/20/21 0432 05/21/21 0603  NA 128* 131*  K 4.0 2.9*  CL 94* 95*  CO2 19* 24  GLUCOSE 94 117*  BUN 92* 85*  CREATININE 4.72* 3.75*  CALCIUM 8.9 8.8*  PHOS  --  4.6   Liver Function Tests Recent Labs    05/21/21 0603  ALBUMIN 3.0*    No results for input(s): LIPASE, AMYLASE in the last 72 hours. Cardiac Enzymes No results for input(s): CKTOTAL, CKMB, CKMBINDEX, TROPONINI in the last 72 hours.  BNP: BNP (last 3 results) Recent Labs    04/24/21 1231 04/30/21 1235  BNP 1,444.9* 898.1*    ProBNP (last 3 results) Recent Labs    07/25/20 1036  PROBNP 8,332*     D-Dimer No results for input(s): DDIMER in the last 72 hours. Hemoglobin A1C No results for input(s): HGBA1C in the last 72 hours. Fasting Lipid Panel No results for input(s): CHOL, HDL, LDLCALC, TRIG, CHOLHDL, LDLDIRECT in the last 72 hours. Thyroid Function Tests No results for input(s): TSH, T4TOTAL, T3FREE, THYROIDAB in the last 72 hours.  Invalid input(s): FREET3  Other results:   Imaging    No results found.   Medications:     Scheduled Medications:  feeding supplement  237 mL Oral BID BM   midodrine  10 mg Oral  TID WC   multivitamin  1 tablet Oral QHS   polyethylene glycol  17 g Oral Daily   potassium chloride  40 mEq Oral BID   sodium bicarbonate  650 mg Oral TID   sodium chloride flush  3 mL Intravenous Q12H   Warfarin - Pharmacist Dosing Inpatient   Does not apply q1600    Infusions:  sodium chloride     milrinone 0.125 mcg/kg/min (05/20/21 1444)    PRN Medications: sodium chloride, acetaminophen, loperamide, ondansetron (ZOFRAN) IV, sodium chloride, sodium chloride flush, traMADol, traZODone    Patient Profile   71 y.o. 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 Admitted with acute on chronic  CHF.  Assessment/Plan   1.  Acute on chronic systolic CHF with low output/valvular HF with mixed moderate to severe PH/cor pulmonale in setting of longstanding MV disease - Echo 7/20  LVEF normal. RV severely dilated and HK. RVSP 55 - RHC 8/20 with significantly elevated biventricular pressures, moderate to severe mixed PAH and low output (CI 1.96) - M-spike negative. PYP 1/21 negative for amyloid.  - Echo (6/22): newly reduced EF 35-40%, RV severely reduced, moderate AR, mod/severe PR. (Dr. Haroldine Laws felt EF 50-55%). - Lexiscan stress MPI, 08/22: Partially reversible basal to mid anterior defect consistent w/ infarct and peri-infarct ischemia EF 54% - RHC on admit with elevated filling pressures and low output. CO 2.03. PA sat 43%. Prominent v waves on PCWP tracing.  - Echo 01/31: EF 50-55%, RV severely reduced, RVSP 59 mmHg, BAE, mean gradient 6 mmHg across mitral valve prosthesis, no MR - On admit was placed on milrinone for RV support. Stopped 2/3  - HF stable. 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 Continue to hold diuretics.  - Continue milrinone at 0.125 to promote renal perfusion. Renal function a little better today.  - Hold spiro with CKD - No Entresto/ digoxin w/ CKD. - Continue  midodrine to 10 mg TID for BP and to support renal perfusion  - See discussion below regarding AKI - Given fraility and previous sternotomy x 3, not candidate for advanced therapies. Pallaitive Care now involved. Now DNR/DNI. Home with Hospice at discharge.   2. 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   - Echo 06/22: EF 35-40%, RV severely reduced, mechanical mitral valve with trivial MR - Prominent v waves on PCWP on RHC. Mitral valve prosthesis appears okay on TTE  - Aware of need for SBE prophylaxis.  - INR  3.0  today. Continue warfarin dosing per PharmD.  - INR goal lowered to 2.5-3 d/t recurrent epistaxis.   3.  Chronic a flutter  -Rate  a little higher on milrinone.  - Continue AC   4.  Thoracic aneurysm - CT chest 6/20 asc ao aorta 4.4 cm.   - 48 mm on echo this admit - Not surgical candidate.   5. AKI on Stage IV CKD - Followed by nephrology. - Baseline SCr~2.4 - C/w midodrine to support renal perfusion - SCr rising possible due to post-procedure CIN (received 120cc contrast). Hold diuretics.  - Scr 2.95>3.05>3.17>3.31>3.9>4.milrinone started>>3.7 today - Continue midodrine to 10 mg TID - Nephrology appreciated  - Hopefully can avoid short term dialysis.    6. 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 Negative for high-grade dysplasia/carcinoma - Followed routinely by GI.  7. Pancytopenia -Platelet stable -Hgb 8.7>8>7.1 today . No bleeding .  May need to transfuse.   8. OSA - Severe, newly diagnosed.   9. Epistaxis  - persistent nosebleed despite conservative measures - ENT consulted. Appreciate assistance. - S/p embolization by IR  2/10   10. FTT - Previously working with a nutritionist. - Nearing end-stage RV failure.  - Palliative Care consulted   11. DNR/DNI - Palliative Care Team appreciated.  - see discussion above  12. Deconditioning - Ambulate w/ PT - HH PT recommended   13. Hematuria, painless -  UA + hematuria. Urine cytology with no atypical urothelial cells.  -  Renal u/s - no mass or obstruction  -  Outpatient f/u with urology  14. Hyponatremia - Na 131  15. Hypokalemia - K 2.9 today.  - Supp K    Plan to d/c home with [facility] and Hospice. [facility] setting up DME.   Length of Stay: [place], NP  05/21/2021, 8:35 AM  Advanced Heart Failure Team Pager [phone] (M-F; 7a - 5p)  Please contact [facility] for night-coverage after hours (5p -7a ) and weekends on a  Patient seen and examined with the above-signed Advanced Practice Provider and/or Housestaff. I personally reviewed laboratory data, imaging studies and relevant notes. I independently  examined the patient and formulated the important aspects of the plan. I have edited the note to reflect any of my changes or salient points. I have personally discussed the plan with the patient and/or family.  Started on milrinone yesterday given AKI. Serum creatinine much improved today. Denies SOB, orthopnea or PND. No epistaxis. Diuretics on hold. Weight stable.   General:  Weak appearing. Cachetic No resp difficulty HEENT: normal Neck: supple.JVP 8-9 prominent v waves Carotids 2+ bilat; no bruits. No lymphadenopathy or thryomegaly appreciated. Cor: PMI nondisplaced. Irregular rate & rhythm. Mechanical s1 Lungs: clear Abdomen: soft, nontender, nondistended. No hepatosplenomegaly. No bruits or masses. Good bowel sounds. Extremities: no cyanosis, clubbing, rash, edema Neuro: alert & orientedx3, cranial nerves grossly intact. moves all 4 extremities w/o difficulty. Affect pleasant  Renal function improved with milrinone. Will continue for 1-2 days until SCr nadirs. Supp K. Hold diuretics today. Can possibly give a dose tomorrow as needed.   Glori Bickers, MD  9:05 AM

## 2021-05-21 NOTE — Progress Notes (Addendum)
ANTICOAGULATION CONSULT NOTE - Follow Up Consult  Pharmacy Consult for warfarin Indication: mechanical mitral valve + chronic aflutter  Allergies  Allergen Reactions   Penicillins Rash    **tolerated amoxicillin Feb 2023**  Did it involve swelling of the face/tongue/throat, SOB, or low BP? No Did it involve sudden or severe rash/hives, skin peeling, or any reaction on the inside of your mouth or nose? No Did you need to seek medical attention at a hospital or doctor's office? No When did it last happen?      30 + years If all above answers are "NO", may proceed with cephalosporin use.     Patient Measurements: Height: 6\' 1"  (185.4 cm) Weight: 49.8 kg (109 lb 11.2 oz) IBW/kg (Calculated) : 79.9  Vital Signs: Temp: 97.3 F (36.3 C) (02/16 1151) Temp Source: Oral (02/16 1151) BP: 104/68 (02/16 1151) Pulse Rate: 75 (02/16 1151)  Labs: Recent Labs    05/19/21 0146 05/20/21 0432 05/21/21 0603  HGB 8.1* 8.0* 7.1*  HCT 25.3* 24.9* 22.8*  PLT 136* 143* 125*  LABPROT 32.3* 36.4* 31.1*  INR 3.1* 3.7* 3.0*  CREATININE 3.99* 4.72* 3.75*     Estimated Creatinine Clearance: 13.3 mL/min (A) (by C-G formula based on SCr of 3.75 mg/dL (H)).  Assessment: 71 yo M with a history of mitral valve replacement x 3, currently with mechanical valve. Patient being admitted after RHC. INR elevated initially at 3.7. No hematoma noted post cath.   Warfarin dosing prior to admit was reportedly 5 mg MWF and 2.5 mg all other days, which was adjusted for cath. However, prior visit shows regimen of 2.5 mg Sun/Thurs and 5 mg all other days.  Patient had severe nose bleeds after cath. ENT consulted and bilateral packing was placed on 2/10 now both removed. Antibiotics stopped.   INR down to 3 this morning. Hgb down overnight to 7.1. Platelet count 125. Placed on milrinone overnight. Clear nasal drainage but no epistaxis.   Goal of Therapy:  INR 2.5-3 Monitor platelets by anticoagulation protocol:  Yes   Plan:  Low dose warfarin tonight to avoid large drop - 1mg  x1 Daily INR, CBC Monitor for bleeding, new/increased epistaxis   Thank you for including pharmacy in the care of this patient.  Erin Hearing PharmD., BCPS Clinical Pharmacist 05/21/2021 2:34 PM

## 2021-05-21 NOTE — Progress Notes (Signed)
[name] NEPHROLOGY PROGRESS NOTE  Assessment/ Plan: Pt is a 71 y.o. yo male  male with history of mechanical mitral valve, chronic atrial flutter, chronic systolic CHF with moderate to severe pulmonary hypertension, cor pulmonale, CKD with baseline serum creatinine level seems to be around 2.5, admitted with acute on chronic CHF seen as a consultation at the request of Dr. Haroldine Laws for AKI on CKD.  #Acute kidney injury on CKD stage IV: Acute kidney injury presumably due to contrast nephropathy concomitant with chronic cardiorenal syndrome.  Kidney ultrasound ruled out obstruction.   Started milrinone yesterday with increased urine output to 1.3 L and creatinine level trending down.  He is clinically stable without evidence of volume overload and no uremic symptoms.  I recommend to continue inotrope today and watch for renal recovery. Continue daily lab, strict ins and out for accurate urine output measurement. We will monitor kidney function for next few days.  The patient will not tolerate outpatient long-term hemodialysis given end-stage heart disease however we can certainly try temporary dialysis if patient and family desire.  It was discussed with the patient, cardiology and the patient's ex-wife who is his caretaker on 2/15.   #Hyponatremia, related with CHF.  Loop diuretics on hold now because of AKI.  Sodium level trending up.   #Metabolic acidosis: CO2 improved, on sodium bicarbonate  #Hypokalemia: Replete potassium chloride.  Monitor lab.   #Anemia likely due to CKD and blood loss by epistaxis.  INR is 3.7 today.  Iron saturation only 20% with low hemoglobin.  He will will benefit from IV iron, will order   #Acute on chronic systolic CHF with low output/valvular heart failure/moderate to severe pulm hypertension, cor pulmonale in the setting of longstanding mitral valve disease: Cardiology is following.  Not on Entresto, SGLT, ACE inhibitor or ARB because of low  GFR. Per cardiology, patient is not a candidate for advanced therapy for end-stage heart disease.  He is currently DNR/DNI.   #Epistaxis status post IR embolization of nasal artery on 2/10.  The bleeding seems to be better.  He is still on Coumadin.   #Moderate to severe malnutrition, failure to thrive, deconditioning: Palliative support and nutrition.   Subjective: Seen and examined at bedside.  Patient had urine output of around 1.3 L.  Denies nausea, vomiting, dysgeusia, chest pain, shortness of breath. Objective Vital signs in last 24 hours: Vitals:   05/20/21 2146 05/21/21 0031 05/21/21 0430 05/21/21 0723  BP: 121/61 100/65 (!) 99/55 102/61  Pulse: 66 69 69 72  Resp: 20 20 20 20   Temp: (!) 97.5 F (36.4 C) 97.6 F (36.4 C) (!) 97.5 F (36.4 C) 97.6 F (36.4 C)  TempSrc: Oral Oral Oral Oral  SpO2: 99% 99% 100% 98%  Weight:   49.8 kg   Height:       Weight change: 0.227 kg  Intake/Output Summary (Last 24 hours) at 05/21/2021 0843 Last data filed at 05/21/2021 0726 Gross per 24 hour  Intake 720 ml  Output 1500 ml  Net -780 ml       Labs: Basic Metabolic Panel: Recent Labs  Lab 05/19/21 0146 05/20/21 0432 05/21/21 0603  NA 131* 128* 131*  K 4.4 4.0 2.9*  CL 96* 94* 95*  CO2 19* 19* 24  GLUCOSE 103* 94 117*  BUN 81* 92* 85*  CREATININE 3.99* 4.72* 3.75*  CALCIUM 9.3 8.9 8.8*  PHOS  --   --  4.6   Liver Function Tests: Recent Labs  Lab  05/21/21 0603  ALBUMIN 3.0*   No results for input(s): LIPASE, AMYLASE in the last 168 hours. No results for input(s): AMMONIA in the last 168 hours. CBC: Recent Labs  Lab 05/17/21 0406 05/18/21 0435 05/19/21 0146 05/20/21 0432 05/21/21 0603  WBC 6.3 6.8 6.3 5.0 5.3  HGB 8.1* 8.7* 8.1* 8.0* 7.1*  HCT 25.2* 27.9* 25.3* 24.9* 22.8*  MCV 103.7* 106.5* 102.8* 101.2* 102.2*  PLT 115* 133* 136* 143* 125*   Cardiac Enzymes: No results for input(s): CKTOTAL, CKMB, CKMBINDEX, TROPONINI in the last 168 hours. CBG: No  results for input(s): GLUCAP in the last 168 hours.  Iron Studies:  Recent Labs    05/20/21 1416 05/21/21 0603  IRON 71  --   TIBC 358  --   FERRITIN  --  117   Studies/Results: No results found.  Medications: Infusions:  sodium chloride     milrinone 0.125 mcg/kg/min (05/20/21 1444)    Scheduled Medications:  feeding supplement  237 mL Oral BID BM   midodrine  10 mg Oral TID WC   multivitamin  1 tablet Oral QHS   polyethylene glycol  17 g Oral Daily   potassium chloride  40 mEq Oral BID   sodium bicarbonate  650 mg Oral TID   sodium chloride flush  3 mL Intravenous Q12H   Warfarin - Pharmacist Dosing Inpatient   Does not apply q1600    have reviewed scheduled and prn medications.  Physical Exam: General:NAD, comfortable Heart: Mechanical heart sound, s1s2 nl Lungs:clear b/l, no crackle Abdomen:soft, Non-tender, non-distended Extremities:No edema Neurology: Alert, awake,  Mike Berntsen Tanna Furry 05/21/2021,8:43 AM  LOS: 16 days

## 2021-05-22 ENCOUNTER — Encounter (HOSPITAL_COMMUNITY): Payer: Medicare HMO

## 2021-05-22 DIAGNOSIS — Z7189 Other specified counseling: Secondary | ICD-10-CM | POA: Diagnosis not present

## 2021-05-22 DIAGNOSIS — I50810 Right heart failure, unspecified: Secondary | ICD-10-CM | POA: Diagnosis not present

## 2021-05-22 DIAGNOSIS — I5032 Chronic diastolic (congestive) heart failure: Secondary | ICD-10-CM | POA: Diagnosis not present

## 2021-05-22 LAB — CBC
HCT: 24.3 % — ABNORMAL LOW (ref 39.0–52.0)
Hemoglobin: 7.3 g/dL — ABNORMAL LOW (ref 13.0–17.0)
MCH: 31.3 pg (ref 26.0–34.0)
MCHC: 30 g/dL (ref 30.0–36.0)
MCV: 104.3 fL — ABNORMAL HIGH (ref 80.0–100.0)
Platelets: 117 10*3/uL — ABNORMAL LOW (ref 150–400)
RBC: 2.33 MIL/uL — ABNORMAL LOW (ref 4.22–5.81)
RDW: 31 % — ABNORMAL HIGH (ref 11.5–15.5)
WBC: 5.7 10*3/uL (ref 4.0–10.5)
nRBC: 0 % (ref 0.0–0.2)

## 2021-05-22 LAB — PROTIME-INR
INR: 2.1 — ABNORMAL HIGH (ref 0.8–1.2)
Prothrombin Time: 23.2 seconds — ABNORMAL HIGH (ref 11.4–15.2)

## 2021-05-22 LAB — BASIC METABOLIC PANEL
Anion gap: 12 (ref 5–15)
BUN: 66 mg/dL — ABNORMAL HIGH (ref 8–23)
CO2: 23 mmol/L (ref 22–32)
Calcium: 8.8 mg/dL — ABNORMAL LOW (ref 8.9–10.3)
Chloride: 96 mmol/L — ABNORMAL LOW (ref 98–111)
Creatinine, Ser: 3.01 mg/dL — ABNORMAL HIGH (ref 0.61–1.24)
GFR, Estimated: 22 mL/min — ABNORMAL LOW (ref >60)
Glucose, Bld: 91 mg/dL (ref 70–99)
Potassium: 3.7 mmol/L (ref 3.5–5.1)
Sodium: 131 mmol/L — ABNORMAL LOW (ref 135–145)

## 2021-05-22 MED ORDER — WARFARIN SODIUM 2 MG PO TABS
4.0000 mg | ORAL_TABLET | Freq: Once | ORAL | Status: AC
  Administered 2021-05-22: 4 mg via ORAL
  Filled 2021-05-22: qty 2

## 2021-05-22 NOTE — Progress Notes (Signed)
[facility] NEPHROLOGY PROGRESS NOTE  Assessment/ Plan: Pt is a 71 y.o. yo male  male with history of mechanical mitral valve, chronic atrial flutter, chronic systolic CHF with moderate to severe pulmonary hypertension, cor pulmonale, CKD with baseline serum creatinine level seems to be around 2.5, admitted with acute on chronic CHF seen as a consultation at the request of Dr. Haroldine Laws for AKI on CKD.  #Acute kidney injury on CKD stage IV: Acute kidney injury presumably due to contrast nephropathy concomitant with chronic cardiorenal syndrome.  Kidney ultrasound ruled out obstruction.    Patient's kidney function continues to improve with creatinine down to 3.  Continue care per cardiology.  If creatinine continues to improve tomorrow may sign off.   #Hyponatremia, related with CHF.  Sodium level stable at 678   #Metabolic acidosis: Bicarbonate now at goal.  Can stop oral bicarbonate  #Hypokalemia: Normal today   #Anemia likely due to CKD and blood loss by epistaxis.  IV iron has been ordered   #Acute on chronic systolic CHF with low output/valvular heart failure/moderate to severe pulm hypertension, cor pulmonale in the setting of longstanding mitral valve disease: Cardiology is following.  Not on Entresto, SGLT, ACE inhibitor or ARB because of low GFR. Per cardiology, patient is not a candidate for advanced therapy for end-stage heart disease.  He is currently DNR/DNI.   #Epistaxis status post IR embolization of nasal artery on 2/10.  The bleeding seems to be better.  He is still on Coumadin.   #Moderate to severe malnutrition, failure to thrive, deconditioning: Palliative support and nutrition.   Subjective: Denies significant shortness of breath today.  No bleeding that he is aware of.  Otherwise no complaints.  Objective Vital signs in last 24 hours: Vitals:   05/22/21 0042 05/22/21 0426 05/22/21 0428 05/22/21 0713  BP: (!) 105/57 95/60 96/68  110/72  Pulse: 69 69  64   Resp: (!) 22 (!) 23 20 18   Temp: 97.6 F (36.4 C)  (!) 97.5 F (36.4 C) 97.9 F (36.6 C)  TempSrc: Oral  Oral Oral  SpO2: 96% 97% 98% 97%  Weight:   50.1 kg   Height:       Weight change: 0.318 kg  Intake/Output Summary (Last 24 hours) at 05/22/2021 0941 Last data filed at 05/22/2021 9381 Gross per 24 hour  Intake 273.61 ml  Output 1250 ml  Net -976.39 ml       Labs: Basic Metabolic Panel: Recent Labs  Lab 05/20/21 0432 05/21/21 0603 05/22/21 0648  NA 128* 131* 131*  K 4.0 2.9* 3.7  CL 94* 95* 96*  CO2 19* 24 23  GLUCOSE 94 117* 91  BUN 92* 85* 66*  CREATININE 4.72* 3.75* 3.01*  CALCIUM 8.9 8.8* 8.8*  PHOS  --  4.6  --    Liver Function Tests: Recent Labs  Lab 05/21/21 0603  ALBUMIN 3.0*   No results for input(s): LIPASE, AMYLASE in the last 168 hours. No results for input(s): AMMONIA in the last 168 hours. CBC: Recent Labs  Lab 05/18/21 0435 05/19/21 0146 05/20/21 0432 05/21/21 0603 05/22/21 0648  WBC 6.8 6.3 5.0 5.3 5.7  HGB 8.7* 8.1* 8.0* 7.1* 7.3*  HCT 27.9* 25.3* 24.9* 22.8* 24.3*  MCV 106.5* 102.8* 101.2* 102.2* 104.3*  PLT 133* 136* 143* 125* 117*   Cardiac Enzymes: No results for input(s): CKTOTAL, CKMB, CKMBINDEX, TROPONINI in the last 168 hours. CBG: No results for input(s): GLUCAP in the last 168 hours.  Iron Studies:  Recent Labs    05/20/21 1416 05/21/21 0603  IRON 71  --   TIBC 358  --   FERRITIN  --  117   Studies/Results: No results found.  Medications: Infusions:  sodium chloride     milrinone 0.125 mcg/kg/min (05/22/21 0846)    Scheduled Medications:  feeding supplement  237 mL Oral BID BM   midodrine  10 mg Oral TID WC   multivitamin  1 tablet Oral QHS   polyethylene glycol  17 g Oral Daily   sodium bicarbonate  650 mg Oral TID   sodium chloride flush  3 mL Intravenous Q12H   warfarin  4 mg Oral ONCE-1600   Warfarin - Pharmacist Dosing Inpatient   Does not apply q1600    have reviewed scheduled and  prn medications.  Physical Exam: General:NAD, comfortable Heart: large venous pulsations at the neck, normal rate Lungs:bl chest rise w/ no iwob Abdomen:soft, Non-tender, non-distended Extremities:No edema Neurology: Alert, awake,  Shaune Pollack Emaree Chiu 05/22/2021,9:41 AM  LOS: 17 days

## 2021-05-22 NOTE — Progress Notes (Addendum)
Advanced Heart Failure Rounding Note  PCP-Cardiologist: Sinclair Grooms, MD   Subjective:   2/2 ENT consulted for nose bleed. L nare packed.  2/3 Milrinone stopped 2/6 R nare packed.  2/7 L nare packing removed.  2/8 Given dose of metolazone.  2/10 Underwent nasal cavity arterial embolization by IR 2/13 L nare packing removed 2/15 Placed on milrinone 0.125 mcg   Scr peaked 4.72>>3.0 BUN 66.  Na 131   Hgb 8.7>8.1>8.0>7.1 >7.3   INR 2.1  (goal now 2.5-3)  Denies SOB. No bleeding issues.   Objective:   Weight Range: 50.1 kg Body mass index is 14.57 kg/m.   Vital Signs:   Temp:  [97.3 F (36.3 C)-97.9 F (36.6 C)] 97.9 F (36.6 C) (02/17 0713) Pulse Rate:  [64-75] 64 (02/17 0428) Resp:  [18-23] 18 (02/17 0713) BP: (95-112)/(57-76) 110/72 (02/17 0713) SpO2:  [96 %-98 %] 97 % (02/17 0713) Weight:  [50.1 kg] 50.1 kg (02/17 0428) Last BM Date : 05/20/21  Weight change: Filed Weights   05/20/21 0445 05/21/21 0430 05/22/21 0428  Weight: 49.5 kg 49.8 kg 50.1 kg    Intake/Output:   Intake/Output Summary (Last 24 hours) at 05/22/2021 0831 Last data filed at 05/22/2021 2297 Gross per 24 hour  Intake 753.61 ml  Output 1250 ml  Net -496.39 ml      Physical Exam  General:  Thin.  No resp difficulty HEENT: normal Neck: supple. no JVD. Carotids 2+ bilat; no bruits. No lymphadenopathy or thryomegaly appreciated. Cor: PMI nondisplaced. Regular rate & rhythm. No rubs, gallops or murmurs. Lungs: clear Abdomen: soft, nontender, nondistended. No hepatosplenomegaly. No bruits or masses. Good bowel sounds. Extremities: no cyanosis, clubbing, rash, edema Neuro: alert & orientedx3, cranial nerves grossly intact. moves all 4 extremities w/o difficulty. Affect pleasant  Telemetry   A flutter with occasional PVCs 60s personally checked.    Labs    CBC Recent Labs    05/21/21 0603 05/22/21 0648  WBC 5.3 5.7  HGB 7.1* 7.3*  HCT 22.8* 24.3*  MCV 102.2* 104.3*   PLT 125* 989*   Basic Metabolic Panel Recent Labs    05/21/21 0603 05/22/21 0648  NA 131* 131*  K 2.9* 3.7  CL 95* 96*  CO2 24 23  GLUCOSE 117* 91  BUN 85* 66*  CREATININE 3.75* 3.01*  CALCIUM 8.8* 8.8*  PHOS 4.6  --    Liver Function Tests Recent Labs    05/21/21 0603  ALBUMIN 3.0*    No results for input(s): LIPASE, AMYLASE in the last 72 hours. Cardiac Enzymes No results for input(s): CKTOTAL, CKMB, CKMBINDEX, TROPONINI in the last 72 hours.  BNP: BNP (last 3 results) Recent Labs    04/24/21 1231 04/30/21 1235  BNP 1,444.9* 898.1*    ProBNP (last 3 results) Recent Labs    07/25/20 1036  PROBNP 8,332*     D-Dimer No results for input(s): DDIMER in the last 72 hours. Hemoglobin A1C No results for input(s): HGBA1C in the last 72 hours. Fasting Lipid Panel No results for input(s): CHOL, HDL, LDLCALC, TRIG, CHOLHDL, LDLDIRECT in the last 72 hours. Thyroid Function Tests No results for input(s): TSH, T4TOTAL, T3FREE, THYROIDAB in the last 72 hours.  Invalid input(s): FREET3  Other results:   Imaging    No results found.   Medications:     Scheduled Medications:  feeding supplement  237 mL Oral BID BM   midodrine  10 mg Oral TID WC   multivitamin  1 tablet Oral QHS   polyethylene glycol  17 g Oral Daily   potassium chloride  40 mEq Oral BID   sodium bicarbonate  650 mg Oral TID   sodium chloride flush  3 mL Intravenous Q12H   Warfarin - Pharmacist Dosing Inpatient   Does not apply q1600    Infusions:  sodium chloride     milrinone 0.125 mcg/kg/min (05/22/21 0655)    PRN Medications: sodium chloride, acetaminophen, loperamide, ondansetron (ZOFRAN) IV, sodium chloride, sodium chloride flush, traMADol, traZODone    Patient Profile   71 y.o. 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 Admitted with acute on chronic CHF.  Assessment/Plan   1.  Acute  on chronic systolic CHF with low output/valvular HF with mixed moderate to severe PH/cor pulmonale in setting of longstanding MV disease - Echo 7/20  LVEF normal. RV severely dilated and HK. RVSP 55 - RHC 8/20 with significantly elevated biventricular pressures, moderate to severe mixed PAH and low output (CI 1.96) - M-spike negative. PYP 1/21 negative for amyloid.  - Echo (6/22): newly reduced EF 35-40%, RV severely reduced, moderate AR, mod/severe PR. (Dr. Haroldine Laws felt EF 50-55%). - Lexiscan stress MPI, 08/22: Partially reversible basal to mid anterior defect consistent w/ infarct and peri-infarct ischemia EF 54% - RHC on admit with elevated filling pressures and low output. CO 2.03. PA sat 43%. Prominent v waves on PCWP tracing.  - Echo 01/31: EF 50-55%, RV severely reduced, RVSP 59 mmHg, BAE, mean gradient 6 mmHg across mitral valve prosthesis, no MR - On admit was placed on milrinone for RV support. Stopped 2/3  - HF stable. CIN post -procedure CIN (received 120cc contrast).  -Continue to hold diuretics.  - Continue milrinone at 0.125 to promote renal perfusion. Continue until nadir reached. Renal function improved.  - Hold spiro with CKD - No Entresto/ digoxin w/ CKD. - Continue  midodrine to 10 mg TID for BP and to support renal perfusion  - See discussion below regarding AKI - Given fraility and previous sternotomy x 3, not candidate for advanced therapies. Pallaitive Care now involved. Now DNR/DNI. Home with Hospice at discharge.   2. 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   - Echo 06/22: EF 35-40%, RV severely reduced, mechanical mitral valve with trivial MR - Prominent v waves on PCWP on RHC. Mitral valve prosthesis appears okay on TTE  - Aware of need for SBE prophylaxis.  - INR  2.1   today. Continue warfarin dosing per PharmD.  - INR goal lowered to 2.5-3 d/t recurrent epistaxis.   3.  Chronic a flutter  -Rate a little higher on milrinone.  -  Continue AC   4.  Thoracic aneurysm - CT chest 6/20 asc ao aorta 4.4 cm.   - 48 mm on echo this admit - Not surgical candidate.   5. AKI on Stage IV CKD - Followed by nephrology. - Baseline SCr~2.4 - C/w midodrine to support renal perfusion - SCr rising possible due to post-procedure CIN (received 120cc contrast). Hold diuretics.  - Scr 2.95>3.05>3.17>3.31>3.9>4.milrinone started>>3.0 today - Continue midodrine to 10 mg TID - Nephrology appreciated  - Hopefully can avoid short term dialysis.    6. 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 Negative for high-grade dysplasia/carcinoma - Followed routinely by GI.  7. Pancytopenia -Platelet stable -Hgb 8.7>8>7.3 today . No bleeding . May need to transfuse.  8. OSA - Severe, newly diagnosed.   9. Epistaxis  - persistent nosebleed despite conservative measures - ENT consulted. Appreciate assistance. - S/p embolization by IR  2/10   10. FTT - Previously working with a nutritionist. - Nearing end-stage RV failure.  - Palliative Care consulted   11. DNR/DNI - Palliative Care Team appreciated.  - see discussion above  12. Deconditioning - Ambulate w/ PT - HH PT recommended   13. Hematuria, painless -  UA + hematuria. Urine cytology with no atypical urothelial cells.  -  Renal u/s - no mass or obstruction  -  Outpatient f/u with urology  14. Hyponatremia - Na 131  15. Hypokalemia - K 3.7 today.  - Supp K    Plan to d/c home with [facility]. [facility] setting up DME.   Length of Stay: [place], NP  05/22/2021, 8:31 AM  Advanced Heart Failure Team Pager [phone] (M-F; 7a - 5p)  Please contact [facility] for night-coverage after hours (5p -7a ) and weekends on a  Patient seen and examined with the above-signed Advanced Practice Provider and/or Housestaff. I personally reviewed laboratory data, imaging studies and relevant notes. I independently examined the patient and formulated  the important aspects of the plan. I have edited the note to reflect any of my changes or salient points. I have personally discussed the plan with the patient and/or family.  Renal function continues to improve with milrinone. Now back down near baseline. Denies SOB, orthopnea or PND. Weight stable off diuretics. INR 2.1  General:  Cachetic Weak appearing. No resp difficulty HEENT: normal Neck: supple. JVP 9-10 prominent v-waves Carotids 2+ bilat; no bruits. No lymphadenopathy or thryomegaly appreciated. Cor: PMI nondisplaced. irregular rate & rhythm. Mechanical s1 2/6 TR Lungs: clear Abdomen: soft, nontender, nondistended. No hepatosplenomegaly. No bruits or masses. Good bowel sounds. Extremities: no cyanosis, clubbing, rash, edema Neuro: alert & orientedx3, cranial nerves grossly intact. moves all 4 extremities w/o difficulty. Affect pleasant  Renal function recovering on milrinone. (Likely had CIN). Would continue milrinone until Scr nadirs then stop. Hold diuretics for now. If weight climbing would restart torsemide 40 daily. Discussed warfarin dosing with PharmD personally.No further epistaxis.   Would keep through the weekend and hopefully home Monday with Hospice.   Glori Bickers, MD  11:06 AM

## 2021-05-22 NOTE — Progress Notes (Signed)
Physical Therapy Treatment Patient Details Name: [name] MRN: [id number] DOB: 1950/04/12 Today's Date: 05/22/2021   History of Present Illness 71 y.o. male presents to [facility] hospital on 05/05/2021 for evaluation of pulmonary hypertension. Pt underwent RHC on 05/05/2021. Pt developed nose bleed on 2/2. PMH includes mitral valve replacement x3, hepatitis C, a flutter, AAA, CKDIII, anemia.    PT Comments    The pt was agreeable to session limited to exercises in the room this afternoon. He completed a series of exercises targeting LE strength and ROM as documented below, including repeated sit-stands, knee extension, standing heel raises, and standing knee flexion. The pt reports no change in pain but did feel some soreness after exercises and declined further ambulation at this time. Will continue to benefit from skilled PT to maintain functional strength and independence with transfers. Safe to d/c with hospice and family support when medically cleared.    Recommendations for follow up therapy are one component of a multi-disciplinary discharge planning process, led by the attending physician.  Recommendations may be updated based on patient status, additional functional criteria and insurance authorization.  Follow Up Recommendations  No PT follow up (home with home hospice)     Assistance Recommended at Discharge Intermittent Supervision/Assistance  Patient can return home with the following Help with stairs or ramp for entrance;Assistance with cooking/housework   Equipment Recommendations  Other (comment) (shower seat)    Recommendations for Other Services       Precautions / Restrictions Precautions Precautions: Fall Precaution Comments: monitor SpO2 Restrictions Weight Bearing Restrictions: No     Mobility  Bed Mobility Overal bed mobility: Independent                  Transfers Overall transfer level: Independent Equipment used: None                General transfer comment: repeated multiple times in session, no assist    Ambulation/Gait Ambulation/Gait assistance: Min guard Gait Distance (Feet): 15 Feet Assistive device: None Gait Pattern/deviations: Step-through pattern Gait velocity: decreased     General Gait Details: small steps, no UE support but pt declined farther ambulation at this time       Balance Overall balance assessment: Needs assistance Sitting-balance support: Feet supported Sitting balance-Leahy Scale: Good     Standing balance support: No upper extremity supported, During functional activity Standing balance-Leahy Scale: Fair Standing balance comment: LOB with initial stand, then reaching for UE support with gait                            Cognition Arousal/Alertness: Awake/alert Behavior During Therapy: Flat affect Overall Cognitive Status: Impaired/Different from baseline Area of Impairment: Following commands, Safety/judgement, Problem solving                       Following Commands: Follows one step commands inconsistently, Follows one step commands with increased time Safety/Judgement: Decreased awareness of safety, Decreased awareness of deficits   Problem Solving: Slow processing, Decreased initiation, Requires verbal cues General Comments: pt with inconsistent command following or needing increased time to follow ~25% of commands, ?hearing? mod cues for technique        Exercises General Exercises - Lower Extremity Long Arc Quad: AROM, Both, 15 reps (against min resistance) Heel Raises: AROM, Both, 15 reps, Standing Mini-Sqauts: AROM, 15 reps, Standing Other Exercises Other Exercises: 5x sit-stand x 3 from elevated EOB Other Exercises:  standing knee flexion x 15    General Comments General comments (skin integrity, edema, etc.): Hr to max 133 bpm      Pertinent Vitals/Pain Pain Assessment Pain Assessment: Faces Faces Pain Scale: Hurts a little bit Pain  Location: generalized grimace Pain Descriptors / Indicators: Discomfort Pain Intervention(s): Monitored during session, Limited activity within patient's tolerance     PT Goals (current goals can now be found in the care plan section) Acute Rehab PT Goals Patient Stated Goal: to return home PT Goal Formulation: With patient Time For Goal Achievement: 05/22/21 Potential to Achieve Goals: Good Progress towards PT goals: Progressing toward goals    Frequency    Min 2X/week      PT Plan Current plan remains appropriate       AM-PAC PT "6 Clicks" Mobility   Outcome Measure  Help needed turning from your back to your side while in a flat bed without using bedrails?: None Help needed moving from lying on your back to sitting on the side of a flat bed without using bedrails?: None Help needed moving to and from a bed to a chair (including a wheelchair)?: None Help needed standing up from a chair using your arms (e.g., wheelchair or bedside chair)?: None Help needed to walk in hospital room?: A Little Help needed climbing 3-5 steps with a railing? : A Little 6 Click Score: 22    End of Session Equipment Utilized During Treatment: Gait belt Activity Tolerance: Patient limited by fatigue Patient left: in bed;with call bell/phone within reach Nurse Communication: Mobility status PT Visit Diagnosis: Muscle weakness (generalized) (M62.81);Other abnormalities of gait and mobility (R26.89)     Time: 0712-1975 PT Time Calculation (min) (ACUTE ONLY): 20 min  Charges:  $Therapeutic Exercise: 8-22 mins                     [place], PT, DPT   Acute Rehabilitation Department Pager #: [phone]    Sandra Cockayne 05/22/2021, 5:32 PM

## 2021-05-22 NOTE — Progress Notes (Signed)
ANTICOAGULATION CONSULT NOTE - Follow Up Consult  Pharmacy Consult for warfarin Indication: mechanical mitral valve + chronic aflutter  Allergies  Allergen Reactions   Penicillins Rash    **tolerated amoxicillin Feb 2023**  Did it involve swelling of the face/tongue/throat, SOB, or low BP? No Did it involve sudden or severe rash/hives, skin peeling, or any reaction on the inside of your mouth or nose? No Did you need to seek medical attention at a hospital or doctor's office? No When did it last happen?      30 + years If all above answers are "NO", may proceed with cephalosporin use.     Patient Measurements: Height: 6\' 1"  (185.4 cm) Weight: 50.1 kg (110 lb 6.4 oz) IBW/kg (Calculated) : 79.9  Vital Signs: Temp: 97.9 F (36.6 C) (02/17 0713) Temp Source: Oral (02/17 0713) BP: 110/72 (02/17 0713) Pulse Rate: 64 (02/17 0428)  Labs: Recent Labs    05/20/21 0432 05/21/21 0603 05/22/21 0648  HGB 8.0* 7.1* 7.3*  HCT 24.9* 22.8* 24.3*  PLT 143* 125* 117*  LABPROT 36.4* 31.1* 23.2*  INR 3.7* 3.0* 2.1*  CREATININE 4.72* 3.75* 3.01*     Estimated Creatinine Clearance: 16.6 mL/min (A) (by C-G formula based on SCr of 3.01 mg/dL (H)).  Assessment: 71 yo M with a history of mitral valve replacement x 3, currently with mechanical valve. Patient being admitted after RHC. INR elevated initially at 3.7. No hematoma noted post cath.   Warfarin dosing prior to admit was reportedly 5 mg MWF and 2.5 mg all other days, which was adjusted for cath. However, prior visit shows regimen of 2.5 mg Sun/Thurs and 5 mg all other days.  Patient had severe nose bleeds after cath. ENT consulted and bilateral packing was placed on 2/10 now both removed. Antibiotics stopped.   INR down to 2.1 this morning. Hgb  stable to 7.3. Placed on milrinone. No epistaxis per patient.   Goal of Therapy:  INR 2.5-3 Monitor platelets by anticoagulation protocol: Yes   Plan:  Warfarin tonight to avoid large  drop - 4mg  x1 Daily INR, CBC Monitor for bleeding, new/increased epistaxis   Thank you for including pharmacy in the care of this patient.  Erin Hearing PharmD., BCPS Clinical Pharmacist 05/22/2021 8:36 AM

## 2021-05-22 NOTE — Progress Notes (Signed)
Daily Progress Note   Patient Name: [name]       Date: 05/22/2021 DOB: [birth year]  Age: 71 y.o. MRN#: [id number] Attending Physician: Jolaine Artist, MD Primary Care Physician: Sharion Settler, DO Admit Date: 05/05/2021  Reason for Consultation/Follow-up: Establishing goals of care  Patient Profile/HPI:  71 y.o. male  with past medical history of rheumatic heart disease, CHF, mitral valve replacement x 3, chronic a flutter, chronic kidney disease stage 4, chronic anemia, admitted on 05/05/2021 for R heart cath and IV diuresis for heart failure exacerbation. He is currently on milrinone. He is not a candidate for advanced heart failure therapies. Palliative medicine consulted for end-state HF.     Subjective: Chart reviewed including progress notes, labs. Cr is improving. [place] is tired- trying to nap. He doesn't have any concerns or questions. He is hoping to go home over the weekend.   Review of Systems  Respiratory:  Negative for shortness of breath.   Psychiatric/Behavioral:  Negative for depression. The patient is not nervous/anxious.     Physical Exam Vitals and nursing note reviewed.  Constitutional:      Comments: frail  Pulmonary:     Effort: Pulmonary effort is normal.  Neurological:     General: No focal deficit present.     Mental Status: He is alert and oriented to person, place, and time.            Vital Signs: BP 105/71 (BP Location: Right Arm)    Pulse 97    Temp 98 F (36.7 C) (Oral)    Resp 20    Ht 6\' 1"  (1.854 m)    Wt 50.1 kg    SpO2 97%    BMI 14.57 kg/m  SpO2: SpO2: 97 % O2 Device: O2 Device: Room Air O2 Flow Rate:    Intake/output summary:  Intake/Output Summary (Last 24 hours) at 05/22/2021 1412 Last data filed at 05/22/2021 1034 Gross  per 24 hour  Intake 493.61 ml  Output 1250 ml  Net -756.39 ml    LBM: Last BM Date : 05/20/21 Baseline Weight: Weight: 54 kg Most recent weight: Weight: 50.1 kg       Palliative Assessment/Data: PPS: 40%      Patient Active Problem List   Diagnosis Date Noted   Advanced care planning/counseling discussion  Protein-calorie malnutrition, severe 05/06/2021   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[place]) 05/05/2021   Pruritus 03/19/2021   Epistaxis, recurrent 03/19/2021   OSA (obstructive sleep apnea) 12/15/2020   Rhomboid muscle strain 09/05/2020   Hospital discharge follow-up 06/17/2020   History of hypokalemia 06/17/2020   CKD (chronic kidney disease) stage 4, GFR 15-29 ml/min ([place]) 06/17/2020   Onychomycosis 06/17/2020   H/O mitral valve replacement with mechanical valve    Persistent atrial fibrillation ([facility])    Duodenal adenoma    Anticoagulated    Acute blood loss anemia 11/26/2019   Gastric polyp    Benign neoplasm of colon    Vertigo 06/07/2018   Acute on chronic diastolic heart failure ([place]) 03/04/2018   Cirrhosis ([place]) 05/08/2014   Chronic atrial flutter ([place]) 06/05/2013   Hepatitis C 06/05/2013   Chronic diastolic heart failure ([place]) 06/05/2013   Encounter for therapeutic drug monitoring 04/30/2013   Mechanical mitral valve with surgery x3 01/08/2013   History of colonic polyps 11/14/2012   Thoracic aortic aneurysm 05/02/2012    Palliative Care Assessment & Plan    Assessment/Recommendations/Plan  GOC for now are to d/c home with hospice pending stabilization of Cr    Code Status: DNR  Prognosis:  Unable to determine  Discharge Planning: To Be Determined  Care plan was discussed with patient and family member.   Thank you for allowing the Palliative Medicine Team to assist in the care of this patient.  Mariana Kaufman, AGNP-C Palliative Medicine   Please contact Palliative Medicine Team phone at [phone] for questions and concerns.

## 2021-05-23 DIAGNOSIS — I50810 Right heart failure, unspecified: Secondary | ICD-10-CM | POA: Diagnosis not present

## 2021-05-23 LAB — PROTIME-INR
INR: 2.1 — ABNORMAL HIGH (ref 0.8–1.2)
Prothrombin Time: 23.1 seconds — ABNORMAL HIGH (ref 11.4–15.2)

## 2021-05-23 LAB — BASIC METABOLIC PANEL
Anion gap: 11 (ref 5–15)
BUN: 60 mg/dL — ABNORMAL HIGH (ref 8–23)
CO2: 22 mmol/L (ref 22–32)
Calcium: 8.6 mg/dL — ABNORMAL LOW (ref 8.9–10.3)
Chloride: 94 mmol/L — ABNORMAL LOW (ref 98–111)
Creatinine, Ser: 2.91 mg/dL — ABNORMAL HIGH (ref 0.61–1.24)
GFR, Estimated: 23 mL/min — ABNORMAL LOW (ref >60)
Glucose, Bld: 100 mg/dL — ABNORMAL HIGH (ref 70–99)
Potassium: 4.4 mmol/L (ref 3.5–5.1)
Sodium: 127 mmol/L — ABNORMAL LOW (ref 135–145)

## 2021-05-23 MED ORDER — WARFARIN SODIUM 2.5 MG PO TABS
2.5000 mg | ORAL_TABLET | Freq: Once | ORAL | Status: AC
  Administered 2021-05-23: 2.5 mg via ORAL
  Filled 2021-05-23: qty 1

## 2021-05-23 NOTE — Progress Notes (Signed)
Progress Note  Patient Name: [name] Date of Encounter: 05/23/2021  Primary Cardiologist: Sinclair Grooms, MD   Subjective   Dyspnea unchanged. No chest pain.   Inpatient Medications    Scheduled Meds:  feeding supplement  237 mL Oral BID BM   midodrine  10 mg Oral TID WC   multivitamin  1 tablet Oral QHS   polyethylene glycol  17 g Oral Daily   sodium chloride flush  3 mL Intravenous Q12H   Warfarin - Pharmacist Dosing Inpatient   Does not apply q1600   Continuous Infusions:  sodium chloride     milrinone 0.125 mcg/kg/min (05/22/21 0846)   PRN Meds: sodium chloride, acetaminophen, loperamide, ondansetron (ZOFRAN) IV, sodium chloride, sodium chloride flush, traMADol, traZODone   Vital Signs    Vitals:   05/22/21 2055 05/23/21 0043 05/23/21 0512 05/23/21 0726  BP: 100/71 90/64 103/60 110/66  Pulse: 93  67 95  Resp: 17 (!) 22 20 20   Temp: (!) 97.5 F (36.4 C) 97.6 F (36.4 C) 97.6 F (36.4 C) 97.6 F (36.4 C)  TempSrc: Oral Oral Oral Oral  SpO2: 100% 96% 100% 95%  Weight:   50.4 kg   Height:        Intake/Output Summary (Last 24 hours) at 05/23/2021 1012 Last data filed at 05/23/2021 0844 Gross per 24 hour  Intake 1167.45 ml  Output 1300 ml  Net -132.55 ml   Filed Weights   05/21/21 0430 05/22/21 0428 05/23/21 0512  Weight: 49.8 kg 50.1 kg 50.4 kg    Telemetry    Atrial flutter with a CVR - Personally Reviewed  ECG    none - Personally Reviewed  Physical Exam   GEN: frail, malnourished appearing, no acute distress.   Neck: 8 cm JVD Cardiac: IRRR, no murmurs, rubs, or gallops.  Respiratory: Clear to auscultation bilaterally. GI: Soft, nontender, non-distended  MS: No edema; No deformity. Neuro:  Nonfocal  Psych: Normal affect   Labs    Chemistry Recent Labs  Lab 05/21/21 0603 05/22/21 0648 05/23/21 0344  NA 131* 131* 127*  K 2.9* 3.7 4.4  CL 95* 96* 94*  CO2 24 23 22   GLUCOSE 117* 91 100*  BUN 85* 66* 60*  CREATININE  3.75* 3.01* 2.91*  CALCIUM 8.8* 8.8* 8.6*  ALBUMIN 3.0*  --   --   GFRNONAA 17* 22* 23*  ANIONGAP 12 12 11      Hematology Recent Labs  Lab 05/20/21 0432 05/21/21 0603 05/22/21 0648  WBC 5.0 5.3 5.7  RBC 2.46* 2.23* 2.33*  HGB 8.0* 7.1* 7.3*  HCT 24.9* 22.8* 24.3*  MCV 101.2* 102.2* 104.3*  MCH 32.5 31.8 31.3  MCHC 32.1 31.1 30.0  RDW 30.1* 31.4* 31.0*  PLT 143* 125* 117*    Cardiac EnzymesNo results for input(s): TROPONINI in the last 168 hours. No results for input(s): TROPIPOC in the last 168 hours.   BNPNo results for input(s): BNP, PROBNP in the last 168 hours.   DDimer No results for input(s): DDIMER in the last 168 hours.   Radiology    No results found.  Cardiac Studies   none  Patient Profile     71 y.o. male admitted with acute on chronic systolic heart failure.  Assessment & Plan    Acute on chronic systolic heart failure- continue IV milrinone.  Pulmonary HTN - he has a low output state.  Hypotension - his bp is stable but soft. Continue midodrine. Acute on chronic renal failure -  his creatinine is improved. Continue IV milrinone.  For questions or updates, please contact [place] Please consult [email or web address] for contact info under Cardiology/STEMI.      Signed, Cristopher Peru, MD  05/23/2021, 10:12 AM

## 2021-05-23 NOTE — Progress Notes (Signed)
ANTICOAGULATION CONSULT NOTE - Follow Up Consult  Pharmacy Consult for warfarin Indication: mechanical mitral valve + chronic aflutter  Allergies  Allergen Reactions   Penicillins Rash    **tolerated amoxicillin Feb 2023**  Did it involve swelling of the face/tongue/throat, SOB, or low BP? No Did it involve sudden or severe rash/hives, skin peeling, or any reaction on the inside of your mouth or nose? No Did you need to seek medical attention at a hospital or doctor's office? No When did it last happen?      30 + years If all above answers are "NO", may proceed with cephalosporin use.     Patient Measurements: Height: 6\' 1"  (185.4 cm) Weight: 50.4 kg (111 lb 1.6 oz) IBW/kg (Calculated) : 79.9  Vital Signs: Temp: 97.6 F (36.4 C) (02/18 0726) Temp Source: Oral (02/18 0726) BP: 110/66 (02/18 0726) Pulse Rate: 95 (02/18 0726)  Labs: Recent Labs    05/21/21 0603 05/22/21 0648 05/23/21 0344  HGB 7.1* 7.3*  --   HCT 22.8* 24.3*  --   PLT 125* 117*  --   LABPROT 31.1* 23.2* 23.1*  INR 3.0* 2.1* 2.1*  CREATININE 3.75* 3.01* 2.91*     Estimated Creatinine Clearance: 17.3 mL/min (A) (by C-G formula based on SCr of 2.91 mg/dL (H)).  Assessment: 71 yo M with a history of mitral valve replacement x 3, currently with mechanical valve on warfarin PTA. Patient being admitted after RHC. INR elevated initially at 3.7. No hematoma noted post cath.   PTA regimen:  1/11 AC visit: INR 2.2 >> 2.5 mg Th/Su, 5 mg all other days 1/20 [facility] visit: INR 4.8 >> reduced regimen to 2.5 mg TTSu, 5 mg all other days 1/24 [facility] visit: INR 3.2 >> 5 mg MWF and 2.5 mg all other days   Patient had severe nose bleeds after cath. ENT consulted and bilateral packing was placed on 2/10 now both removed. Antibiotics stopped.   INR remains at 2.1 this morning after drop from 3.0 yesterday. Warfarin 4 mg x1 given to prevent a large drop. Hgb stable 7.3. Placed on milrinone. No epistaxis per RN.  Goal of  Therapy:  INR 2.5-3 Monitor platelets by anticoagulation protocol: Yes   Plan:  Warfarin 2.5 mg x1 Daily INR, CBC Monitor for bleeding, new/increased epistaxis   Laurey Arrow, PharmD PGY1 Pharmacy Resident 05/23/2021  10:30 AM  Please check [email or web address] for unit-specific pharmacy phone numbers.

## 2021-05-23 NOTE — Progress Notes (Signed)
[facility] NEPHROLOGY PROGRESS NOTE  Assessment/ Plan: Pt is a 71 y.o. yo male  male with history of mechanical mitral valve, chronic atrial flutter, chronic systolic CHF with moderate to severe pulmonary hypertension, cor pulmonale, CKD with baseline serum creatinine level seems to be around 2.5, admitted with acute on chronic CHF seen as a consultation at the request of Dr. Haroldine Laws for AKI on CKD.  #Acute kidney injury on CKD stage IV: Acute kidney injury presumably due to contrast nephropathy concomitant with chronic cardiorenal syndrome.  Kidney ultrasound ruled out obstruction.    Patient's kidney function continues to improve with creatinine down to 2.9.  Continue care per cardiology.  Given his improvement and plans to go home on hospice we will sign off at this time.  Not felt to be a good dialysis candidate long-term.  #Hyponatremia, related with CHF.  Sodium level 127 today.  Fluid status management per cardiology   #Metabolic acidosis: Resolved  #Hypokalemia: Resolved   #Anemia likely due to CKD and blood loss by epistaxis.  IV iron has been ordered   #Acute on chronic systolic CHF with low output/valvular heart failure/moderate to severe pulm hypertension, cor pulmonale in the setting of longstanding mitral valve disease: Cardiology is following.  Not on Entresto, SGLT, ACE inhibitor or ARB because of low GFR. Per cardiology, patient is not a candidate for advanced therapy for end-stage heart disease.  He is currently DNR/DNI.  Plans to go home on hospice   #Epistaxis status post IR embolization of nasal artery on 2/10.  The bleeding seems to be better.  He is still on Coumadin.   #Moderate to severe malnutrition, failure to thrive, deconditioning: Palliative support and nutrition.   Subjective: Patient feels okay without any complaints.  Objective Vital signs in last 24 hours: Vitals:   05/22/21 2055 05/23/21 0043 05/23/21 0512 05/23/21 0726  BP: 100/71  90/64 103/60 110/66  Pulse: 93  67 95  Resp: 17 (!) 22 20 20   Temp: (!) 97.5 F (36.4 C) 97.6 F (36.4 C) 97.6 F (36.4 C) 97.6 F (36.4 C)  TempSrc: Oral Oral Oral Oral  SpO2: 100% 96% 100% 95%  Weight:   50.4 kg   Height:       Weight change: 0.318 kg  Intake/Output Summary (Last 24 hours) at 05/23/2021 0825 Last data filed at 05/23/2021 7026 Gross per 24 hour  Intake 1000 ml  Output 875 ml  Net 125 ml       Labs: Basic Metabolic Panel: Recent Labs  Lab 05/21/21 0603 05/22/21 0648 05/23/21 0344  NA 131* 131* 127*  K 2.9* 3.7 4.4  CL 95* 96* 94*  CO2 24 23 22   GLUCOSE 117* 91 100*  BUN 85* 66* 60*  CREATININE 3.75* 3.01* 2.91*  CALCIUM 8.8* 8.8* 8.6*  PHOS 4.6  --   --    Liver Function Tests: Recent Labs  Lab 05/21/21 0603  ALBUMIN 3.0*   No results for input(s): LIPASE, AMYLASE in the last 168 hours. No results for input(s): AMMONIA in the last 168 hours. CBC: Recent Labs  Lab 05/18/21 0435 05/19/21 0146 05/20/21 0432 05/21/21 0603 05/22/21 0648  WBC 6.8 6.3 5.0 5.3 5.7  HGB 8.7* 8.1* 8.0* 7.1* 7.3*  HCT 27.9* 25.3* 24.9* 22.8* 24.3*  MCV 106.5* 102.8* 101.2* 102.2* 104.3*  PLT 133* 136* 143* 125* 117*   Cardiac Enzymes: No results for input(s): CKTOTAL, CKMB, CKMBINDEX, TROPONINI in the last 168 hours. CBG: No results for input(s): GLUCAP  in the last 168 hours.  Iron Studies:  Recent Labs    05/20/21 1416 05/21/21 0603  IRON 71  --   TIBC 358  --   FERRITIN  --  117   Studies/Results: No results found.  Medications: Infusions:  sodium chloride     milrinone 0.125 mcg/kg/min (05/22/21 0846)    Scheduled Medications:  feeding supplement  237 mL Oral BID BM   midodrine  10 mg Oral TID WC   multivitamin  1 tablet Oral QHS   polyethylene glycol  17 g Oral Daily   sodium chloride flush  3 mL Intravenous Q12H   Warfarin - Pharmacist Dosing Inpatient   Does not apply q1600    have reviewed scheduled and prn  medications.  Physical Exam: General:NAD, comfortable Heart: large venous pulsations at the neck, normal rate Lungs:bl chest rise w/ no iwob Abdomen:soft, Non-tender, non-distended Extremities:No edema Neurology: Alert, awake,  Shaune Pollack Sylas Twombly 05/23/2021,8:25 AM  LOS: 18 days

## 2021-05-24 DIAGNOSIS — I50810 Right heart failure, unspecified: Secondary | ICD-10-CM | POA: Diagnosis not present

## 2021-05-24 LAB — BASIC METABOLIC PANEL
Anion gap: 15 (ref 5–15)
BUN: 55 mg/dL — ABNORMAL HIGH (ref 8–23)
CO2: 18 mmol/L — ABNORMAL LOW (ref 22–32)
Calcium: 8.8 mg/dL — ABNORMAL LOW (ref 8.9–10.3)
Chloride: 94 mmol/L — ABNORMAL LOW (ref 98–111)
Creatinine, Ser: 2.88 mg/dL — ABNORMAL HIGH (ref 0.61–1.24)
GFR, Estimated: 23 mL/min — ABNORMAL LOW (ref >60)
Glucose, Bld: 93 mg/dL (ref 70–99)
Potassium: 4.7 mmol/L (ref 3.5–5.1)
Sodium: 127 mmol/L — ABNORMAL LOW (ref 135–145)

## 2021-05-24 LAB — CBC
HCT: 25.6 % — ABNORMAL LOW (ref 39.0–52.0)
Hemoglobin: 7.8 g/dL — ABNORMAL LOW (ref 13.0–17.0)
MCH: 31.2 pg (ref 26.0–34.0)
MCHC: 30.5 g/dL (ref 30.0–36.0)
MCV: 102.4 fL — ABNORMAL HIGH (ref 80.0–100.0)
Platelets: 141 10*3/uL — ABNORMAL LOW (ref 150–400)
RBC: 2.5 MIL/uL — ABNORMAL LOW (ref 4.22–5.81)
RDW: 29.6 % — ABNORMAL HIGH (ref 11.5–15.5)
WBC: 5 10*3/uL (ref 4.0–10.5)
nRBC: 0.6 % — ABNORMAL HIGH (ref 0.0–0.2)

## 2021-05-24 LAB — PROTIME-INR
INR: 2.3 — ABNORMAL HIGH (ref 0.8–1.2)
Prothrombin Time: 25.6 seconds — ABNORMAL HIGH (ref 11.4–15.2)

## 2021-05-24 LAB — HEPARIN LEVEL (UNFRACTIONATED)
Heparin Unfractionated: <0.1 IU/mL — ABNORMAL LOW (ref 0.30–0.70)
Heparin Unfractionated: <0.1 IU/mL — ABNORMAL LOW (ref 0.30–0.70)

## 2021-05-24 MED ORDER — HEPARIN (PORCINE) 25000 UT/250ML-% IV SOLN
1050.0000 [IU]/h | INTRAVENOUS | Status: DC
  Administered 2021-05-24: 900 [IU]/h via INTRAVENOUS
  Filled 2021-05-24: qty 250

## 2021-05-24 MED ORDER — WARFARIN SODIUM 3 MG PO TABS
3.0000 mg | ORAL_TABLET | Freq: Once | ORAL | Status: AC
  Administered 2021-05-24: 3 mg via ORAL
  Filled 2021-05-24: qty 1

## 2021-05-24 NOTE — Progress Notes (Addendum)
ANTICOAGULATION CONSULT NOTE - Follow Up Consult  Pharmacy Consult for warfarin Indication: mechanical mitral valve + chronic aflutter  Allergies  Allergen Reactions   Penicillins Rash    **tolerated amoxicillin Feb 2023**  Did it involve swelling of the face/tongue/throat, SOB, or low BP? No Did it involve sudden or severe rash/hives, skin peeling, or any reaction on the inside of your mouth or nose? No Did you need to seek medical attention at a hospital or doctor's office? No When did it last happen?      30 + years If all above answers are "NO", may proceed with cephalosporin use.     Patient Measurements: Height: 6\' 1"  (185.4 cm) Weight: 49.9 kg (110 lb) (scale c) IBW/kg (Calculated) : 79.9 Heparin dosing weight: 54 kg  Vital Signs: Temp: 98 F (36.7 C) (02/19 0723) Temp Source: Oral (02/19 0723) BP: 113/70 (02/19 0737) Pulse Rate: 74 (02/19 0723)  Labs: Recent Labs    05/22/21 0648 05/23/21 0344 05/24/21 0507  HGB 7.3*  --   --   HCT 24.3*  --   --   PLT 117*  --   --   LABPROT 23.2* 23.1* 25.6*  INR 2.1* 2.1* 2.3*  CREATININE 3.01* 2.91* 2.88*     Estimated Creatinine Clearance: 17.3 mL/min (A) (by C-G formula based on SCr of 2.88 mg/dL (H)).  Assessment: 71 yo M with a history of mitral valve replacement x 3, currently with mechanical valve on warfarin PTA. Patient being admitted after RHC. INR elevated initially at 3.7. No hematoma noted post cath.   PTA regimen:  1/11 AC visit: INR 2.2 >> 2.5 mg Th/Su, 5 mg all other days 1/20 [facility] visit: INR 4.8 >> reduced regimen to 2.5 mg TTSu, 5 mg all other days 1/24 [facility] visit: INR 3.2 >> 5 mg MWF and 2.5 mg all other days   Patient had severe nose bleeds after cath. ENT consulted and bilateral packing was placed on 2/10 now both removed. Antibiotics stopped.   INR 2.3 still subtherapeutic after 3 days (2.1 > 2.1 > 2.3) despite 4 mg dose on 2/17 to prevent a large INR drop. Of note, INR has been labile this  admission - bumped from 3.1 > 3.7 despite conservative warfarin dosing (lower than home regimen) and then dropped 3 > 2.1 after holding warfarin x2 days. Spoke with cardiology, will start low-dose heparin bridge until INR therapeutic.  Last CBC 2/17 - Hgb stable 7.3. Placed on milrinone. No bleeding or recurrent epistaxis per RN.   Goal of Therapy:  INR 2.5-3 Monitor platelets by anticoagulation protocol: Yes   Plan:  Initiate heparin infusion @ 900 units/hr, no bolus Warfarin 3 mg x1  Daily INR, CBC Monitor for bleeding, new/increased epistaxis  Continue heparin gtt until INR >2.5  Laurey Arrow, PharmD PGY1 Pharmacy Resident 05/24/2021  9:01 AM  Please check [email or web address] for unit-specific pharmacy phone numbers.

## 2021-05-24 NOTE — Progress Notes (Signed)
Progress Note  Patient Name: [name] Date of Encounter: 05/24/2021  Primary Cardiologist: Sinclair Grooms, MD   Subjective   C/o feeling weak.   Inpatient Medications    Scheduled Meds:  feeding supplement  237 mL Oral BID BM   midodrine  10 mg Oral TID WC   multivitamin  1 tablet Oral QHS   polyethylene glycol  17 g Oral Daily   sodium chloride flush  3 mL Intravenous Q12H   warfarin  3 mg Oral ONCE-1600   Warfarin - Pharmacist Dosing Inpatient   Does not apply q1600   Continuous Infusions:  sodium chloride     heparin     milrinone 0.125 mcg/kg/min (05/24/21 1000)   PRN Meds: sodium chloride, acetaminophen, loperamide, ondansetron (ZOFRAN) IV, sodium chloride, sodium chloride flush, traMADol, traZODone   Vital Signs    Vitals:   05/24/21 0500 05/24/21 0723 05/24/21 0737 05/24/21 0824  BP:  (!) 105/53 113/70   Pulse:  74    Resp:  14 (!) 21 18  Temp:  98 F (36.7 C)    TempSrc:  Oral    SpO2:  100% 100%   Weight: 49.9 kg     Height:        Intake/Output Summary (Last 24 hours) at 05/24/2021 1101 Last data filed at 05/24/2021 1000 Gross per 24 hour  Intake 395.4 ml  Output 650 ml  Net -254.6 ml   Filed Weights   05/22/21 0428 05/23/21 0512 05/24/21 0500  Weight: 50.1 kg 50.4 kg 49.9 kg    Telemetry    Atrial fib with a CVR - Personally Reviewed  ECG    none - Personally Reviewed  Physical Exam   GEN: No acute distress.   Neck: No JVD Cardiac: IRRR, no murmurs, rubs, or gallops.  Respiratory: Clear to auscultation bilaterally. GI: Soft, nontender, non-distended  MS: No edema; No deformity. Neuro:  Nonfocal  Psych: Normal affect   Labs    Chemistry Recent Labs  Lab 05/21/21 0603 05/22/21 0648 05/23/21 0344 05/24/21 0507  NA 131* 131* 127* 127*  K 2.9* 3.7 4.4 4.7  CL 95* 96* 94* 94*  CO2 24 23 22  18*  GLUCOSE 117* 91 100* 93  BUN 85* 66* 60* 55*  CREATININE 3.75* 3.01* 2.91* 2.88*  CALCIUM 8.8* 8.8* 8.6* 8.8*   ALBUMIN 3.0*  --   --   --   GFRNONAA 17* 22* 23* 23*  ANIONGAP 12 12 11 15      Hematology Recent Labs  Lab 05/20/21 0432 05/21/21 0603 05/22/21 0648  WBC 5.0 5.3 5.7  RBC 2.46* 2.23* 2.33*  HGB 8.0* 7.1* 7.3*  HCT 24.9* 22.8* 24.3*  MCV 101.2* 102.2* 104.3*  MCH 32.5 31.8 31.3  MCHC 32.1 31.1 30.0  RDW 30.1* 31.4* 31.0*  PLT 143* 125* 117*    Cardiac EnzymesNo results for input(s): TROPONINI in the last 168 hours. No results for input(s): TROPIPOC in the last 168 hours.   BNPNo results for input(s): BNP, PROBNP in the last 168 hours.   DDimer No results for input(s): DDIMER in the last 168 hours.   Radiology    No results found.  Cardiac Studies   none  Patient Profile     71 y.o. 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 Continue midodrine and milrinone. Prognosis poor. Coags -  his INR remains under 2.5 and IV heparin has been started until 2.5 or greater. Acute on chronic renal failure - this is a low output state. His creatinine 2.8 from 2.9. Continue milrinone for now.  Prognosis - I mentioned hospice to him.   For questions or updates, please contact [place] Please consult [email or web address] for contact info under Cardiology/STEMI.      Signed, Cristopher Peru, MD  05/24/2021, 11:01 AM

## 2021-05-24 NOTE — Progress Notes (Signed)
ANTICOAGULATION CONSULT NOTE - Follow Up Consult  Pharmacy Consult for warfarin Indication: mechanical mitral valve + chronic aflutter  Allergies  Allergen Reactions   Penicillins Rash    **tolerated amoxicillin Feb 2023**  Did it involve swelling of the face/tongue/throat, SOB, or low BP? No Did it involve sudden or severe rash/hives, skin peeling, or any reaction on the inside of your mouth or nose? No Did you need to seek medical attention at a hospital or doctor's office? No When did it last happen?      30 + years If all above answers are "NO", may proceed with cephalosporin use.     Patient Measurements: Height: 6\' 1"  (185.4 cm) Weight: 49.9 kg (110 lb) (scale c) IBW/kg (Calculated) : 79.9 Heparin dosing weight: 54 kg  Vital Signs: Temp: 97.8 F (36.6 C) (02/19 1915) Temp Source: Oral (02/19 1915) BP: 114/89 (02/19 1915) Pulse Rate: 86 (02/19 1915)  Labs: Recent Labs    05/22/21 0648 05/23/21 0344 05/24/21 0507 05/24/21 1140 05/24/21 1857  HGB 7.3*  --   --  7.8*  --   HCT 24.3*  --   --  25.6*  --   PLT 117*  --   --  141*  --   LABPROT 23.2* 23.1* 25.6*  --   --   INR 2.1* 2.1* 2.3*  --   --   HEPARINUNFRC  --   --   --  <0.10* <0.10*  CREATININE 3.01* 2.91* 2.88*  --   --      Estimated Creatinine Clearance: 17.3 mL/min (A) (by C-G formula based on SCr of 2.88 mg/dL (H)).  Assessment: 71 yo M with a history of mitral valve replacement x 3, currently with mechanical valve on warfarin PTA. Patient being admitted after RHC. INR elevated initially at 3.7.   PTA regimen:  1/11 AC visit: INR 2.2 >> 2.5 mg Th/Su, 5 mg all other days 1/20 [facility] visit: INR 4.8 >> reduced regimen to 2.5 mg TTSu, 5 mg all other days 1/24 [facility] visit: INR 3.2 >> 5 mg MWF and 2.5 mg all other days   Patient had severe nose bleeds after cath. ENT consulted and bilateral packing was placed on 2/10 now both removed. Heparin started 2/19 due to INR= 2.3 -heparin level undetectable on 900  units/hr   Goal of Therapy:  INR 2.5-3 Monitor platelets by anticoagulation protocol: Yes   Plan:  Increase heparin to 1050 units/hr Daily INR, CBC Monitor for bleeding, new/increased epistaxis  Continue heparin gtt until INR >2.5  Hildred Laser, PharmD Clinical Pharmacist **Pharmacist phone directory can now be found on [email or web address] ([email or web address]).  Listed under [place].

## 2021-05-25 ENCOUNTER — Encounter (HOSPITAL_COMMUNITY): Payer: Medicare HMO

## 2021-05-25 DIAGNOSIS — I50810 Right heart failure, unspecified: Secondary | ICD-10-CM | POA: Diagnosis not present

## 2021-05-25 LAB — CBC
HCT: 26.1 % — ABNORMAL LOW (ref 39.0–52.0)
Hemoglobin: 7.9 g/dL — ABNORMAL LOW (ref 13.0–17.0)
MCH: 30.7 pg (ref 26.0–34.0)
MCHC: 30.3 g/dL (ref 30.0–36.0)
MCV: 101.6 fL — ABNORMAL HIGH (ref 80.0–100.0)
Platelets: 133 10*3/uL — ABNORMAL LOW (ref 150–400)
RBC: 2.57 MIL/uL — ABNORMAL LOW (ref 4.22–5.81)
RDW: 29.2 % — ABNORMAL HIGH (ref 11.5–15.5)
WBC: 5.5 10*3/uL (ref 4.0–10.5)
nRBC: 0 % (ref 0.0–0.2)

## 2021-05-25 LAB — BASIC METABOLIC PANEL
Anion gap: 13 (ref 5–15)
BUN: 57 mg/dL — ABNORMAL HIGH (ref 8–23)
CO2: 21 mmol/L — ABNORMAL LOW (ref 22–32)
Calcium: 9 mg/dL (ref 8.9–10.3)
Chloride: 93 mmol/L — ABNORMAL LOW (ref 98–111)
Creatinine, Ser: 3.24 mg/dL — ABNORMAL HIGH (ref 0.61–1.24)
GFR, Estimated: 20 mL/min — ABNORMAL LOW (ref >60)
Glucose, Bld: 97 mg/dL (ref 70–99)
Potassium: 4.2 mmol/L (ref 3.5–5.1)
Sodium: 127 mmol/L — ABNORMAL LOW (ref 135–145)

## 2021-05-25 LAB — TYPE AND SCREEN
ABO/RH(D): O POS
Antibody Screen: NEGATIVE

## 2021-05-25 LAB — PROTIME-INR
INR: 2.5 — ABNORMAL HIGH (ref 0.8–1.2)
Prothrombin Time: 26.9 seconds — ABNORMAL HIGH (ref 11.4–15.2)

## 2021-05-25 LAB — HEPARIN LEVEL (UNFRACTIONATED): Heparin Unfractionated: 0.3 IU/mL (ref 0.30–0.70)

## 2021-05-25 MED ORDER — MIDODRINE HCL 10 MG PO TABS: 10.0000 mg | ORAL_TABLET | Freq: Three times a day (TID) | ORAL | 6 refills | Status: DC

## 2021-05-25 MED ORDER — FUROSEMIDE 40 MG PO TABS
40.0000 mg | ORAL_TABLET | Freq: Every day | ORAL | 6 refills | Status: AC
Start: 2021-05-26 — End: ?

## 2021-05-25 MED ORDER — MIDODRINE HCL 10 MG PO TABS: 10.0000 mg | ORAL_TABLET | Freq: Three times a day (TID) | ORAL | 6 refills | Status: AC

## 2021-05-25 MED ORDER — ONDANSETRON HCL 4 MG PO TABS: 4.0000 mg | ORAL_TABLET | Freq: Three times a day (TID) | ORAL | 0 refills | Status: AC | PRN

## 2021-05-25 MED ORDER — WARFARIN SODIUM 5 MG PO TABS: 5.0000 mg | ORAL_TABLET | Freq: Once | ORAL | Status: DC

## 2021-05-25 MED ORDER — FUROSEMIDE 40 MG PO TABS
40.0000 mg | ORAL_TABLET | Freq: Every day | ORAL | Status: DC
Start: 2021-05-26 — End: 2021-05-25

## 2021-05-25 NOTE — TOC Transition Note (Addendum)
Transition of Care [employer] [facility]) - CM/SW Discharge Note   Patient Details  Name: [name] MRN: [id number] Date of Birth: December 23, 1950  Transition of Care [facility]) CM/SW Contact:  Erenest Rasher, RN Phone Number: [phone] 05/25/2021, 11:08 AM   Clinical Narrative:     HF TOC CM notified [facility] that pt will dc home today. Will need oxygen and RW. Wife purchased a cane. [facility] will set up oxygen and delivery of RW. Hospice RN will do a soc today once pt has arrive home. Wife will provide transportation.   Final next level of care: Home w Hospice Care Barriers to Discharge: No Barriers Identified   Patient Goals and CMS Choice Patient states their goals for this hospitalization and ongoing recovery are:: would like to get stronger CMS Medicare.gov Compare Post Acute Care list provided to:: Patient Choice offered to / list presented to : Patient  Discharge Placement                       Discharge Plan and Services In-house Referral: Clinical Social Work Discharge Planning Services: CM Consult Post Acute Care Choice: Home Health                    HH Arranged: RN [facility] Agency:  ([facility]) Date HH Agency Contacted: 05/25/21 Time HH Agency Contacted: 3 Representative spoke with at [place]: Hoyle Sauer  Social Determinants of Health (SDOH) Interventions Food Insecurity Interventions: Intervention Not Indicated Financial Strain Interventions: Intervention Not Indicated Housing Interventions: Intervention Not Indicated Transportation Interventions: Intervention Not Indicated   Readmission Risk Interventions Readmission Risk Prevention Plan 05/22/2021  Transportation Screening Complete  HRI or Home Care Consult Complete  Social Work Consult for [place] Planning/Counseling Complete  Palliative Care Screening Complete  Medication Review [employer]) Complete  Some recent data might be hidden

## 2021-05-25 NOTE — Progress Notes (Signed)
ANTICOAGULATION CONSULT NOTE - Follow Up Consult  Pharmacy Consult for warfarin Indication: mechanical mitral valve + chronic aflutter  Allergies  Allergen Reactions   Penicillins Rash    **tolerated amoxicillin Feb 2023**  Did it involve swelling of the face/tongue/throat, SOB, or low BP? No Did it involve sudden or severe rash/hives, skin peeling, or any reaction on the inside of your mouth or nose? No Did you need to seek medical attention at a hospital or doctor's office? No When did it last happen?      30 + years If all above answers are "NO", may proceed with cephalosporin use.     Patient Measurements: Height: 6\' 1"  (185.4 cm) Weight: 49.1 kg (108 lb 3.2 oz) (scale c) IBW/kg (Calculated) : 79.9 Heparin dosing weight: 54 kg  Vital Signs: Temp: 98.2 F (36.8 C) (02/20 0800) Temp Source: Oral (02/20 0800) BP: 96/60 (02/20 0800) Pulse Rate: 100 (02/20 0800)  Labs: Recent Labs    05/23/21 0344 05/24/21 0507 05/24/21 1140 05/24/21 1857 05/25/21 0413  HGB  --   --  7.8*  --  7.9*  HCT  --   --  25.6*  --  26.1*  PLT  --   --  141*  --  133*  LABPROT 23.1* 25.6*  --   --  26.9*  INR 2.1* 2.3*  --   --  2.5*  HEPARINUNFRC  --   --  <0.10* <0.10* 0.30  CREATININE 2.91* 2.88*  --   --  3.24*     Estimated Creatinine Clearance: 15.2 mL/min (A) (by C-G formula based on SCr of 3.24 mg/dL (H)).  Assessment: 71 yo M with a history of mitral valve replacement x 3, currently with mechanical valve on warfarin PTA. Patient being admitted after RHC. INR elevated initially at 3.7. No hematoma noted post cath.   PTA regimen:  1/11 AC visit: INR 2.2 >> 2.5 mg Th/Su, 5 mg all other days 1/20 [facility] visit: INR 4.8 >> reduced regimen to 2.5 mg TTSu, 5 mg all other days 1/24 [facility] visit: INR 3.2 >> 5 mg MWF and 2.5 mg all other days   Patient had severe nose bleeds after cath. ENT consulted and bilateral packing was placed on 2/10 now both removed. Antibiotics stopped.   INR today is  therapeutic at 2.5. CBC stable. INR has been very labile - would resume regimen above at D/C with close INR follow-up later this week.  Goal of Therapy:  INR 2.5-3 Monitor platelets by anticoagulation protocol: Yes   Plan:  Stop heparin Warfarin 5mg  MWF + 2.5mg  TTSS   Arrie Senate, PharmD, BCPS, [facility] Clinical Pharmacist [phone] Please check AMION for all [facility] Pharmacy numbers 05/25/2021

## 2021-05-25 NOTE — Care Management Important Message (Signed)
Important Message  Patient Details  Name: [name] MRN: [id number] Date of Birth: 07-11-1950   Medicare Important Message Given:  Yes     Shelda Altes 05/25/2021, 11:28 AM

## 2021-05-25 NOTE — Progress Notes (Signed)
ANTICOAGULATION CONSULT NOTE - Follow Up Consult  Pharmacy Consult for HEPARIN Indication:  MVR/Aflutter  Labs: Recent Labs    05/22/21 0648 05/23/21 0344 05/24/21 0507 05/24/21 1140 05/24/21 1857 05/25/21 0413  HGB 7.3*  --   --  7.8*  --   --   HCT 24.3*  --   --  25.6*  --   --   PLT 117*  --   --  141*  --   --   LABPROT 23.2* 23.1* 25.6*  --   --   --   INR 2.1* 2.1* 2.3*  --   --   --   HEPARINUNFRC  --   --   --  <0.10* <0.10* 0.30  CREATININE 3.01* 2.91* 2.88*  --   --  3.24*    Assessment/Plan:  71yo male therapeutic on heparin after rate change. Will continue infusion at current rate of 1050 units/hr and confirm stable with additional level.   Wynona Neat, PharmD, BCPS  05/25/2021,5:18 AM

## 2021-05-25 NOTE — Progress Notes (Addendum)
Advanced Heart Failure Rounding Note  PCP-Cardiologist: Sinclair Grooms, MD   Subjective:   2/2 ENT consulted for nose bleed. L nare packed.  2/3 Milrinone stopped 2/6 R nare packed.  2/7 L nare packing removed.  2/8 Given dose of metolazone.  2/10 Underwent nasal cavity arterial embolization by IR 2/13 L nare packing removed 2/15 Placed on milrinone 0.125 mcg   Remains on milrinone 0.125 mcg.   Scr peaked 4.72--> had gone down to 2.9--> today 3.24  BUN 57  Na 127   Hgb 7.9    On heparin drip. INR 2.5   (goal now 2.5-3)  Denies SOB. No bleeding issues.   Objective:   Weight Range: 49.1 kg Body mass index is 14.28 kg/m.   Vital Signs:   Temp:  [97.8 F (36.6 C)-98.2 F (36.8 C)] 98.2 F (36.8 C) (02/20 0800) Pulse Rate:  [86-100] 100 (02/20 0800) Resp:  [18-28] 21 (02/20 0800) BP: (86-128)/(60-89) 96/60 (02/20 0800) SpO2:  [97 %-100 %] 97 % (02/20 0800) Weight:  [49.1 kg] 49.1 kg (02/20 0318) Last BM Date : 05/21/21  Weight change: Filed Weights   05/23/21 0512 05/24/21 0500 05/25/21 0318  Weight: 50.4 kg 49.9 kg 49.1 kg    Intake/Output:   Intake/Output Summary (Last 24 hours) at 05/25/2021 0852 Last data filed at 05/25/2021 0839 Gross per 24 hour  Intake 1066.76 ml  Output 400 ml  Net 666.76 ml      Physical Exam  General:  Thin.  No resp difficulty HEENT: normal Neck: supple. no JVD. Carotids 2+ bilat; no bruits. No lymphadenopathy or thryomegaly appreciated. Cor: PMI nondisplaced. Irregular rate & rhythm. No rubs, gallops or murmurs. Lungs: clear Abdomen: soft, nontender, nondistended. No hepatosplenomegaly. No bruits or masses. Good bowel sounds. Extremities: no cyanosis, clubbing, rash, edema Neuro: alert & orientedx3, cranial nerves grossly intact. moves all 4 extremities w/o difficulty. Affect pleasant  Telemetry   A Fib 80-100s    Labs    CBC Recent Labs    05/24/21 1140 05/25/21 0413  WBC 5.0 5.5  HGB 7.8* 7.9*  HCT  25.6* 26.1*  MCV 102.4* 101.6*  PLT 141* 654*   Basic Metabolic Panel Recent Labs    05/24/21 0507 05/25/21 0413  NA 127* 127*  K 4.7 4.2  CL 94* 93*  CO2 18* 21*  GLUCOSE 93 97  BUN 55* 57*  CREATININE 2.88* 3.24*  CALCIUM 8.8* 9.0   Liver Function Tests No results for input(s): AST, ALT, ALKPHOS, BILITOT, PROT, ALBUMIN in the last 72 hours.   No results for input(s): LIPASE, AMYLASE in the last 72 hours. Cardiac Enzymes No results for input(s): CKTOTAL, CKMB, CKMBINDEX, TROPONINI in the last 72 hours.  BNP: BNP (last 3 results) Recent Labs    04/24/21 1231 04/30/21 1235  BNP 1,444.9* 898.1*    ProBNP (last 3 results) Recent Labs    07/25/20 1036  PROBNP 8,332*     D-Dimer No results for input(s): DDIMER in the last 72 hours. Hemoglobin A1C No results for input(s): HGBA1C in the last 72 hours. Fasting Lipid Panel No results for input(s): CHOL, HDL, LDLCALC, TRIG, CHOLHDL, LDLDIRECT in the last 72 hours. Thyroid Function Tests No results for input(s): TSH, T4TOTAL, T3FREE, THYROIDAB in the last 72 hours.  Invalid input(s): FREET3  Other results:   Imaging    No results found.   Medications:     Scheduled Medications:  feeding supplement  237 mL Oral BID BM  midodrine  10 mg Oral TID WC   multivitamin  1 tablet Oral QHS   polyethylene glycol  17 g Oral Daily   sodium chloride flush  3 mL Intravenous Q12H   Warfarin - Pharmacist Dosing Inpatient   Does not apply q1600    Infusions:  sodium chloride     heparin 1,050 Units/hr (05/24/21 2019)   milrinone 0.125 mcg/kg/min (05/24/21 2000)    PRN Medications: sodium chloride, acetaminophen, ondansetron (ZOFRAN) IV, sodium chloride, sodium chloride flush, traMADol, traZODone    Patient Profile   71 y.o. 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 Admitted with acute on chronic CHF.  Assessment/Plan    1.  Acute on chronic systolic CHF with low output/valvular HF with mixed moderate to severe PH/cor pulmonale in setting of longstanding MV disease - Echo 7/20  LVEF normal. RV severely dilated and HK. RVSP 55 - RHC 8/20 with significantly elevated biventricular pressures, moderate to severe mixed PAH and low output (CI 1.96) - M-spike negative. PYP 1/21 negative for amyloid.  - Echo (6/22): newly reduced EF 35-40%, RV severely reduced, moderate AR, mod/severe PR. (Dr. Haroldine Laws felt EF 50-55%). - Lexiscan stress MPI, 08/22: Partially reversible basal to mid anterior defect consistent w/ infarct and peri-infarct ischemia EF 54% - RHC on admit with elevated filling pressures and low output. CO 2.03. PA sat 43%. Prominent v waves on PCWP tracing.  - Echo 01/31: EF 50-55%, RV severely reduced, RVSP 59 mmHg, BAE, mean gradient 6 mmHg across mitral valve prosthesis, no MR - On admit was placed on milrinone for RV support. Stopped 2/3  - HF stable. CIN post -procedure CIN (received 120cc contrast).  -Continue to hold diuretics.  - Continue milrinone at 0.125 to promote renal perfusion. Continue until nadir reached. Renal function trending back up today.   - Hold spiro with CKD - No Entresto/ digoxin w/ CKD. - Continue  midodrine to 10 mg TID for BP and to support renal perfusion  - See discussion below regarding AKI - Given fraility and previous sternotomy x 3, not candidate for advanced therapies. Pallaitive Care now involved. Now DNR/DNI. Home with Hospice at discharge.   2. 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   - Echo 06/22: EF 35-40%, RV severely reduced, mechanical mitral valve with trivial MR - Prominent v waves on PCWP on RHC. Mitral valve prosthesis appears okay on TTE  - Aware of need for SBE prophylaxis.  - INR  2.5  today. Stop heparin drip. Will discuss with pharmacy. Continue warfarin dosing per PharmD.  - INR goal lowered to 2.5-3 d/t recurrent  epistaxis.   3.  Chronic a flutter  -Rate a little higher on milrinone.  - Continue AC   4.  Thoracic aneurysm - CT chest 6/20 asc ao aorta 4.4 cm.   - 48 mm on echo this admit - Not surgical candidate.   5. AKI on Stage IV CKD - Followed by nephrology. - Baseline SCr~2.4 - C/w midodrine to support renal perfusion - SCr rising possible due to post-procedure CIN (received 120cc contrast). Hold diuretics.  - Scr 2.95>3.05>3.17>3.31>3.9>4.milrinone started>>3.2  today. No diuretics.  - Continue midodrine to 10 mg TID - Nephrology appreciated  - Hopefully can avoid short term dialysis.    6. 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 Negative for high-grade dysplasia/carcinoma - Followed routinely by GI.  7. Pancytopenia -Platelets 133  -  Hgb 7.9 today . No bleeding . May need to transfuse.   8. OSA - Severe, newly diagnosed.   9. Epistaxis  - persistent nosebleed despite conservative measures - ENT consulted. Appreciate assistance. - S/p embolization by IR  2/10   10. FTT - Previously working with a nutritionist. - Nearing end-stage RV failure.  - Palliative Care consulted   11. DNR/DNI - Palliative Care Team appreciated.  - see discussion above  12. Deconditioning - Ambulate w/ PT - HH PT recommended   13. Hematuria, painless -  UA + hematuria. Urine cytology with no atypical urothelial cells.  -  Renal u/s - no mass or obstruction  -  Outpatient f/u with urology  14. Hyponatremia - Na 127   15. Hypokalemia -Stable today   Wants to home. Will discuss with Dr Haroldine Laws.   Plan to d/c home with [facility]. [facility] setting up DME.   Length of Stay: [place], NP  05/25/2021, 8:52 AM  Advanced Heart Failure Team Pager [phone] (M-F; 7a - 5p)  Please contact [facility] for night-coverage after hours (5p -7a ) and weekends on a  Patient seen and examined with the above-signed Advanced Practice Provider and/or Housestaff.  I personally reviewed laboratory data, imaging studies and relevant notes. I independently examined the patient and formulated the important aspects of the plan. I have edited the note to reflect any of my changes or salient points. I have personally discussed the plan with the patient and/or family.  Remains on milrinone. Denies SOB, orthopnea or PND  Epistaxis resolved. Scr at baseline. INR 2.5  General:  Thin weak appearing. No resp difficulty HEENT: normal Neck: supple.JVP 9-10 Carotids 2+ bilat; no bruits. No lymphadenopathy or thryomegaly appreciated. Cor: PMI nondisplaced. Irregular rate & rhythm. Mechanical s1 Lungs: clear Abdomen: soft, nontender, nondistended. No hepatosplenomegaly. No bruits or masses. Good bowel sounds. Extremities: no cyanosis, clubbing, rash, edema Neuro: alert & orientedx3, cranial nerves grossly intact. moves all 4 extremities w/o difficulty. Affect pleasant  I think this is as good as we can get him. Will stop milrinone. Home today with Hospice support. Will use lasix 40 daily. Can hold or double based on his volume status.   Total time spent 35 minutes. Over half that time spent discussing above.   Glori Bickers, MD  11:33 AM

## 2021-05-26 NOTE — Progress Notes (Deleted)
° ° °  SUBJECTIVE:   CHIEF COMPLAINT / HPI:   Hospital Follow Up [name] is a 71 y.o. male with a PMHx of rheumatic heart disease s/p MVR x3 complicated by RV failure and cachexia. He was recently hospitalized 1/31 to 2/20 for acute on chronic systolic CHF with mixed moderate to severe pulmonary hypertension/cor pulmonale.  He had a right heart cath on 1/31 that showed elevated filling pressures and low output.  He was admitted from Cath Lab for IV diuretics and inotropic support with milrinone.  Given his advanced diseased and limited options for treatment, palliative care was consulted and patient opted for home hospice and code status changed to DNR/DNI.  While in the hospital, he developed epistaxis that required bilateral nare packing by ENT as well as arterial embolization by IR on 2/10.  Unfortunately, he developed contrast-induced nephropathy (peaked at 4.7, his baseline is ~2.4) post IR procedure, he was placed on empiric milrinone with improvement in his renal function. Given his epistaxis, his INR goal was lowered to 2.5-3.  He reports ***   PERTINENT  PMH / PSH:  Past Medical History:  Diagnosis Date   Allergy    Aneurysm of ascending aorta    Atrial fibrillation (HCC)    BPH (benign prostatic hyperplasia)    Colon polyps    FHx: rheumatic heart disease    H/O diplopia    Hepatitis C    Hypertension    Hypogonadism male    Mitral valve disease    mitral valve repair/replacement x3   OSA (obstructive sleep apnea) 12/15/2020     OBJECTIVE:   There were no vitals taken for this visit. ***  General: NAD, pleasant, able to participate in exam Cardiac: RRR, no murmurs. Respiratory: CTAB, normal effort, No wheezes, rales or rhonchi Abdomen: Bowel sounds present, nontender, nondistended, no hepatosplenomegaly. Extremities: no edema or cyanosis. Skin: warm and dry, no rashes noted Neuro: alert, no obvious focal deficits Psych: Normal affect and  mood  ASSESSMENT/PLAN:   No problem-specific Assessment & Plan notes found for this encounter.     Sharion Settler, [place]

## 2021-05-28 ENCOUNTER — Telehealth: Payer: Self-pay

## 2021-05-28 DIAGNOSIS — R404 Transient alteration of awareness: Secondary | ICD-10-CM | POA: Diagnosis not present

## 2021-05-28 DIAGNOSIS — I469 Cardiac arrest, cause unspecified: Secondary | ICD-10-CM | POA: Diagnosis not present

## 2021-05-28 DIAGNOSIS — Z743 Need for continuous supervision: Secondary | ICD-10-CM | POA: Diagnosis not present

## 2021-05-28 DIAGNOSIS — R4020 Unspecified coma: Secondary | ICD-10-CM | POA: Diagnosis not present

## 2021-05-28 NOTE — Telephone Encounter (Signed)
Called and spoke with Jinny Blossom, Publishing copy at [facility]. Jinny Blossom stated when going out to home visit, today, pt had deceased.

## 2021-05-29 ENCOUNTER — Inpatient Hospital Stay: Payer: Medicare HMO | Admitting: Family Medicine

## 2021-06-03 DIAGNOSIS — 419620001 Death: Secondary | SNOMED CT | POA: Diagnosis not present

## 2021-06-03 DEATH — deceased

## 2021-06-15 ENCOUNTER — Encounter: Payer: Self-pay | Admitting: Gastroenterology

## 2021-08-06 ENCOUNTER — Ambulatory Visit: Payer: Medicare HMO | Admitting: Interventional Cardiology

## 2021-09-08 ENCOUNTER — Encounter: Payer: Self-pay | Admitting: *Deleted

## 2021-10-26 ENCOUNTER — Encounter: Payer: Self-pay | Admitting: Gastroenterology

## 2022-02-27 IMAGING — US US ABDOMEN LIMITED
1 series · 14 of 25 positions shown · non-contrast
Comparison: None.

CLINICAL DATA: Cirrhosis.

EXAM:
ULTRASOUND ABDOMEN LIMITED RIGHT UPPER QUADRANT

[Series 1: us abdomen limited · 14 of 63 slices shown]
[im 1/63]
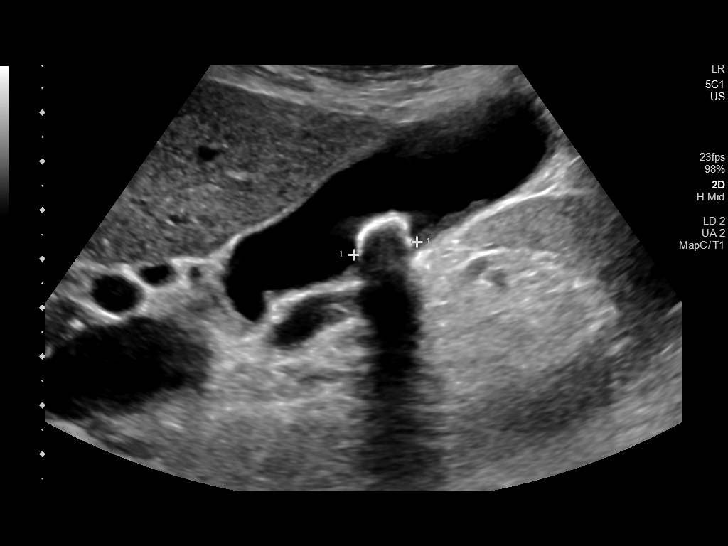
[im 6/63]
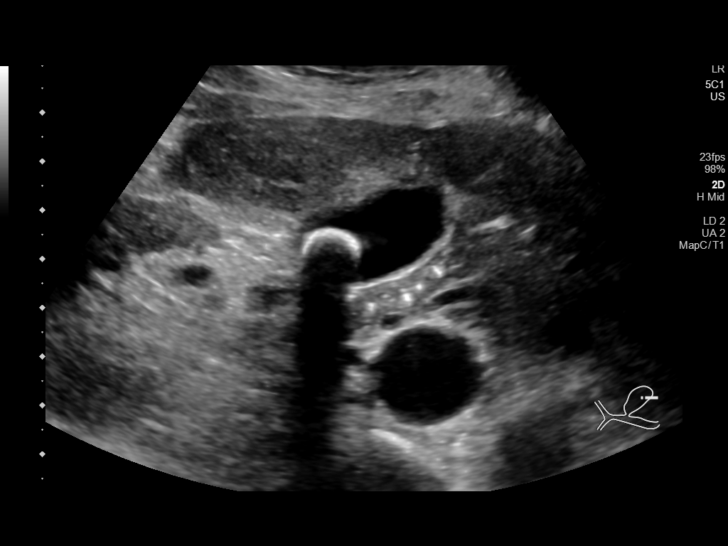
[im 11/63]
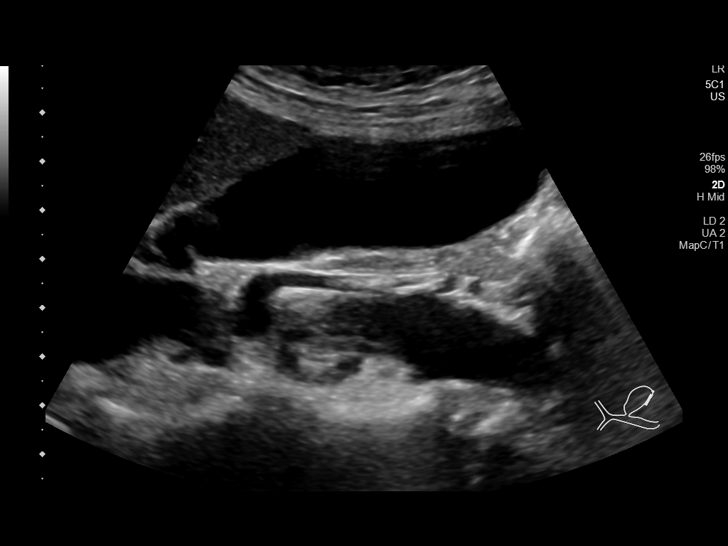
[im 16/63]
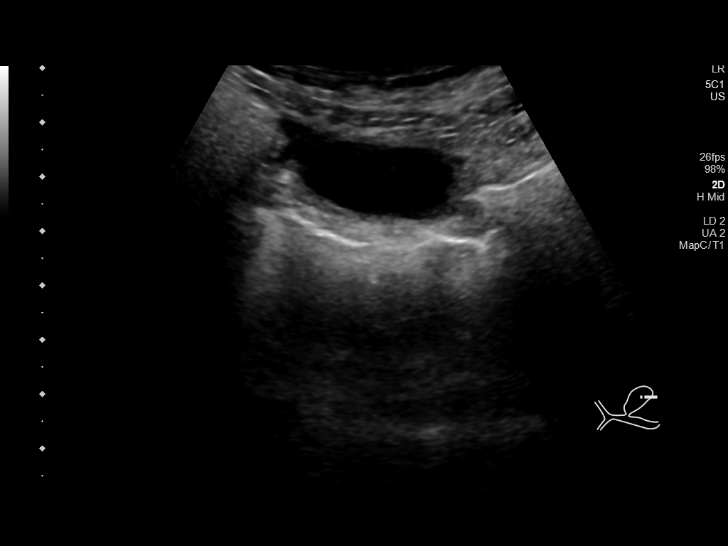
[im 21/63]
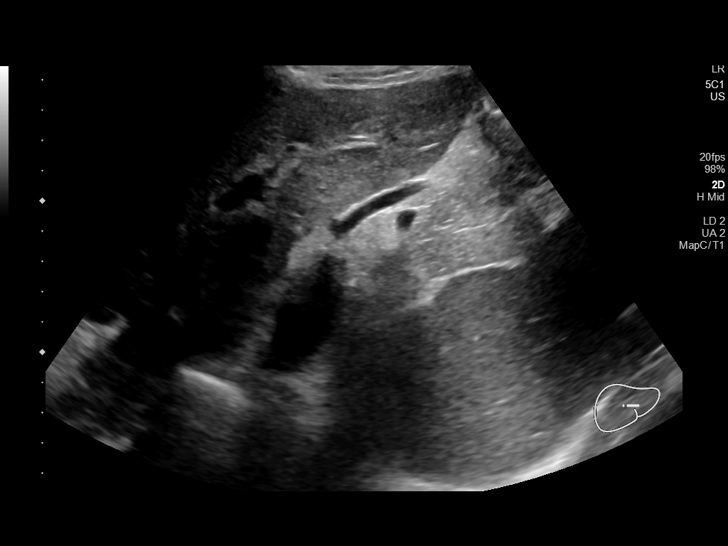
[im 24/63]
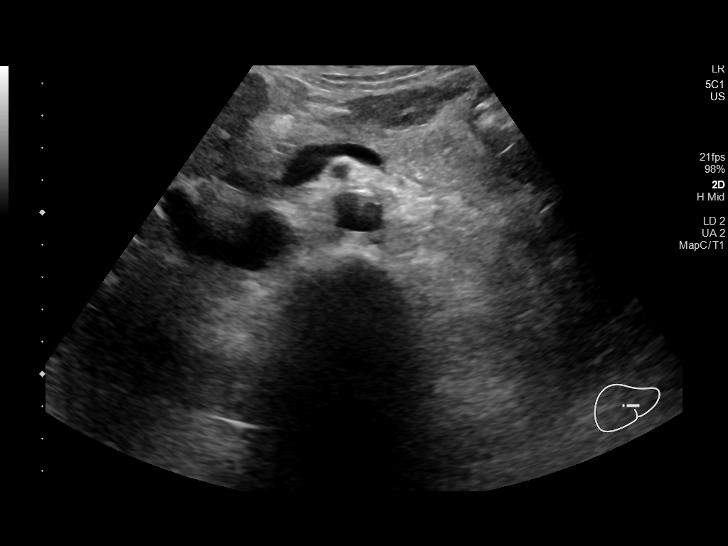
[im 29/63]
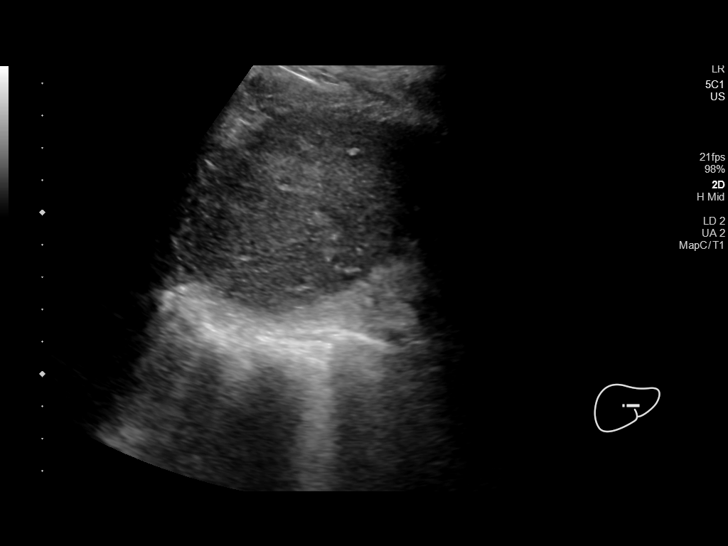
[im 34/63]
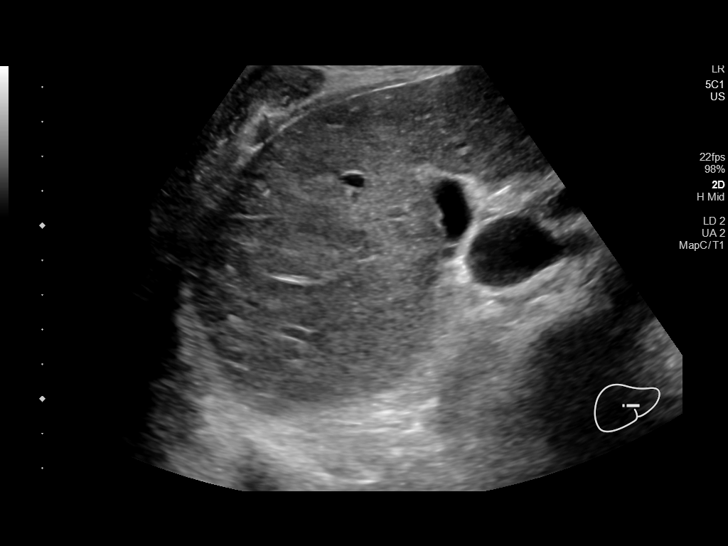
[im 39/63]
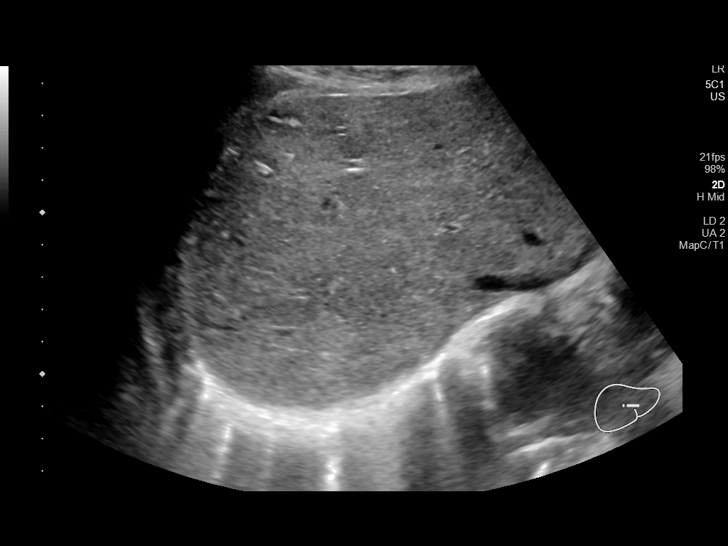
[im 42/63]
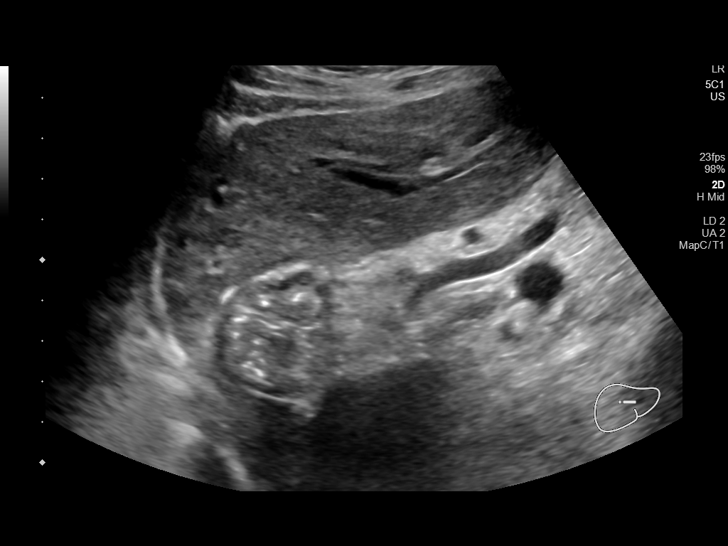
[im 47/63]
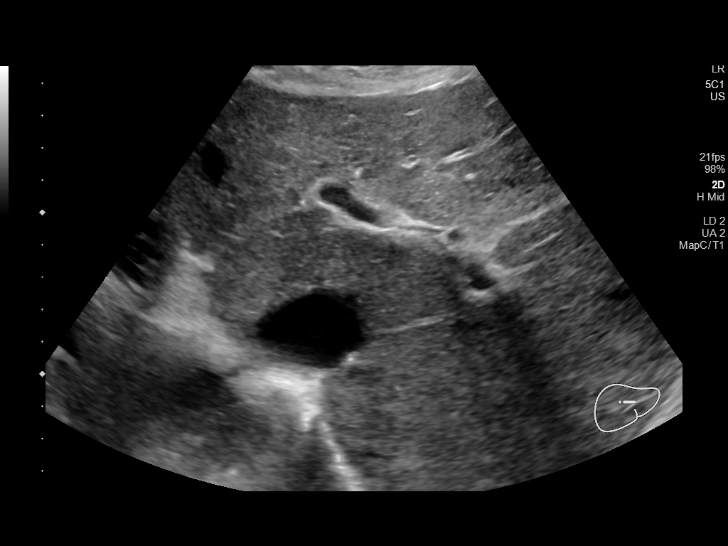
[im 52/63]
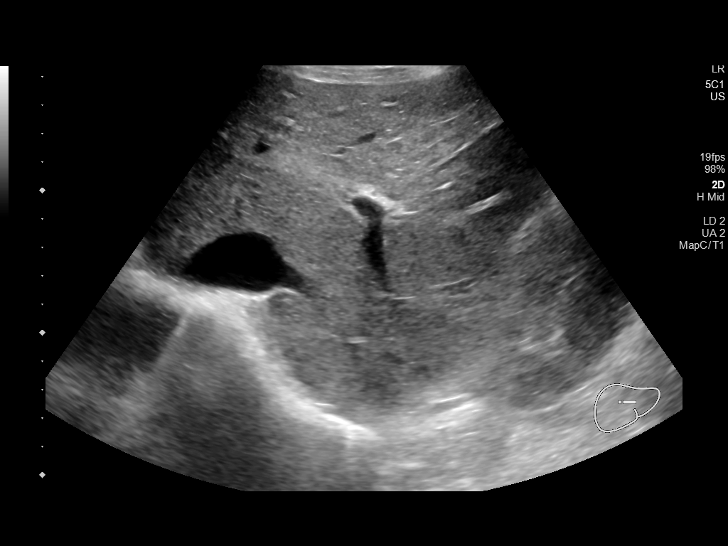
[im 57/63]
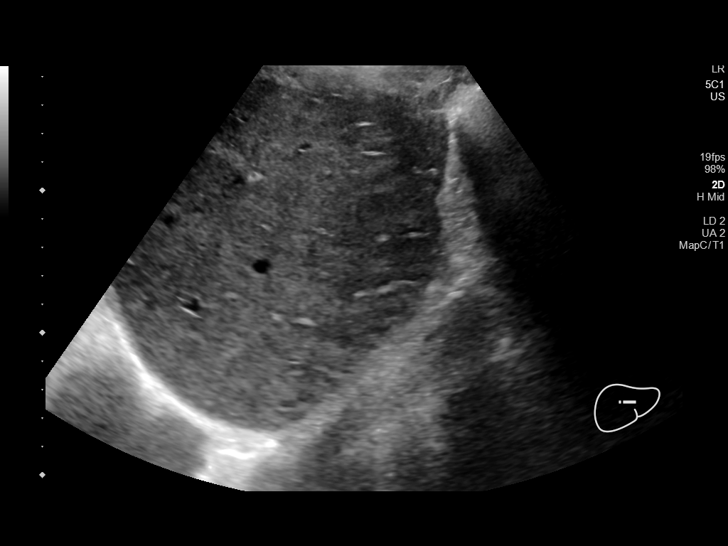
[im 63/63]
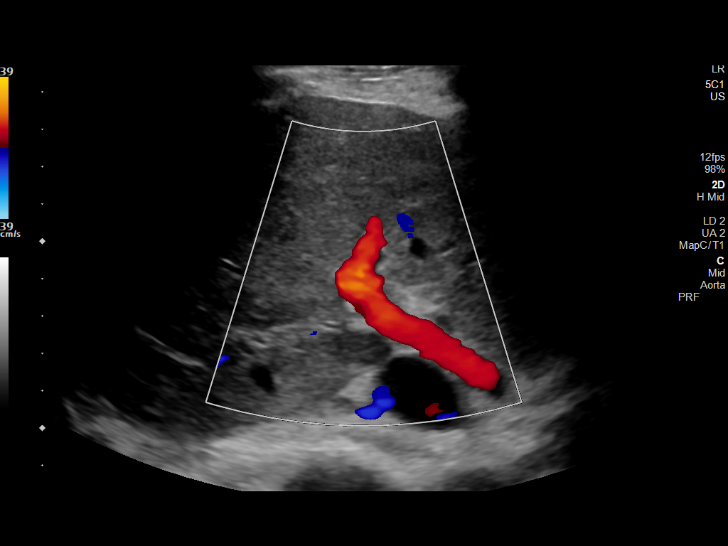

[14 of 25 positions shown; findings below may reference images not displayed]

FINDINGS: Gallbladder:

Several gallstones, largest 1.3 cm. No wall thickening. No
pericholecystic fluid.

Common bile duct:

Diameter: 5 mm.

Liver:

Coarsened, heterogeneous echotexture with generalized increased
parenchymal echogenicity. Normal liver size. No masses. Portal vein
is patent on color Doppler imaging with normal direction of blood
flow towards the liver.

Other: Echogenic right kidney with renal cortical thinning.
IMPRESSION: 1. Liver appearance consistent with the given diagnosis of
cirrhosis. No mass to suggest hepatocellular carcinoma.
2. Cholelithiasis.

## 2022-10-15 IMAGING — DX DG CHEST 1V PORT
1 series · 1 of 1 positions shown · non-contrast
Comparison: Same day at 2144 hours and CT chest 09/19/2018.

CLINICAL DATA: Endotracheal tube placement.

EXAM:
PORTABLE CHEST 1 VIEW

[chest ap]
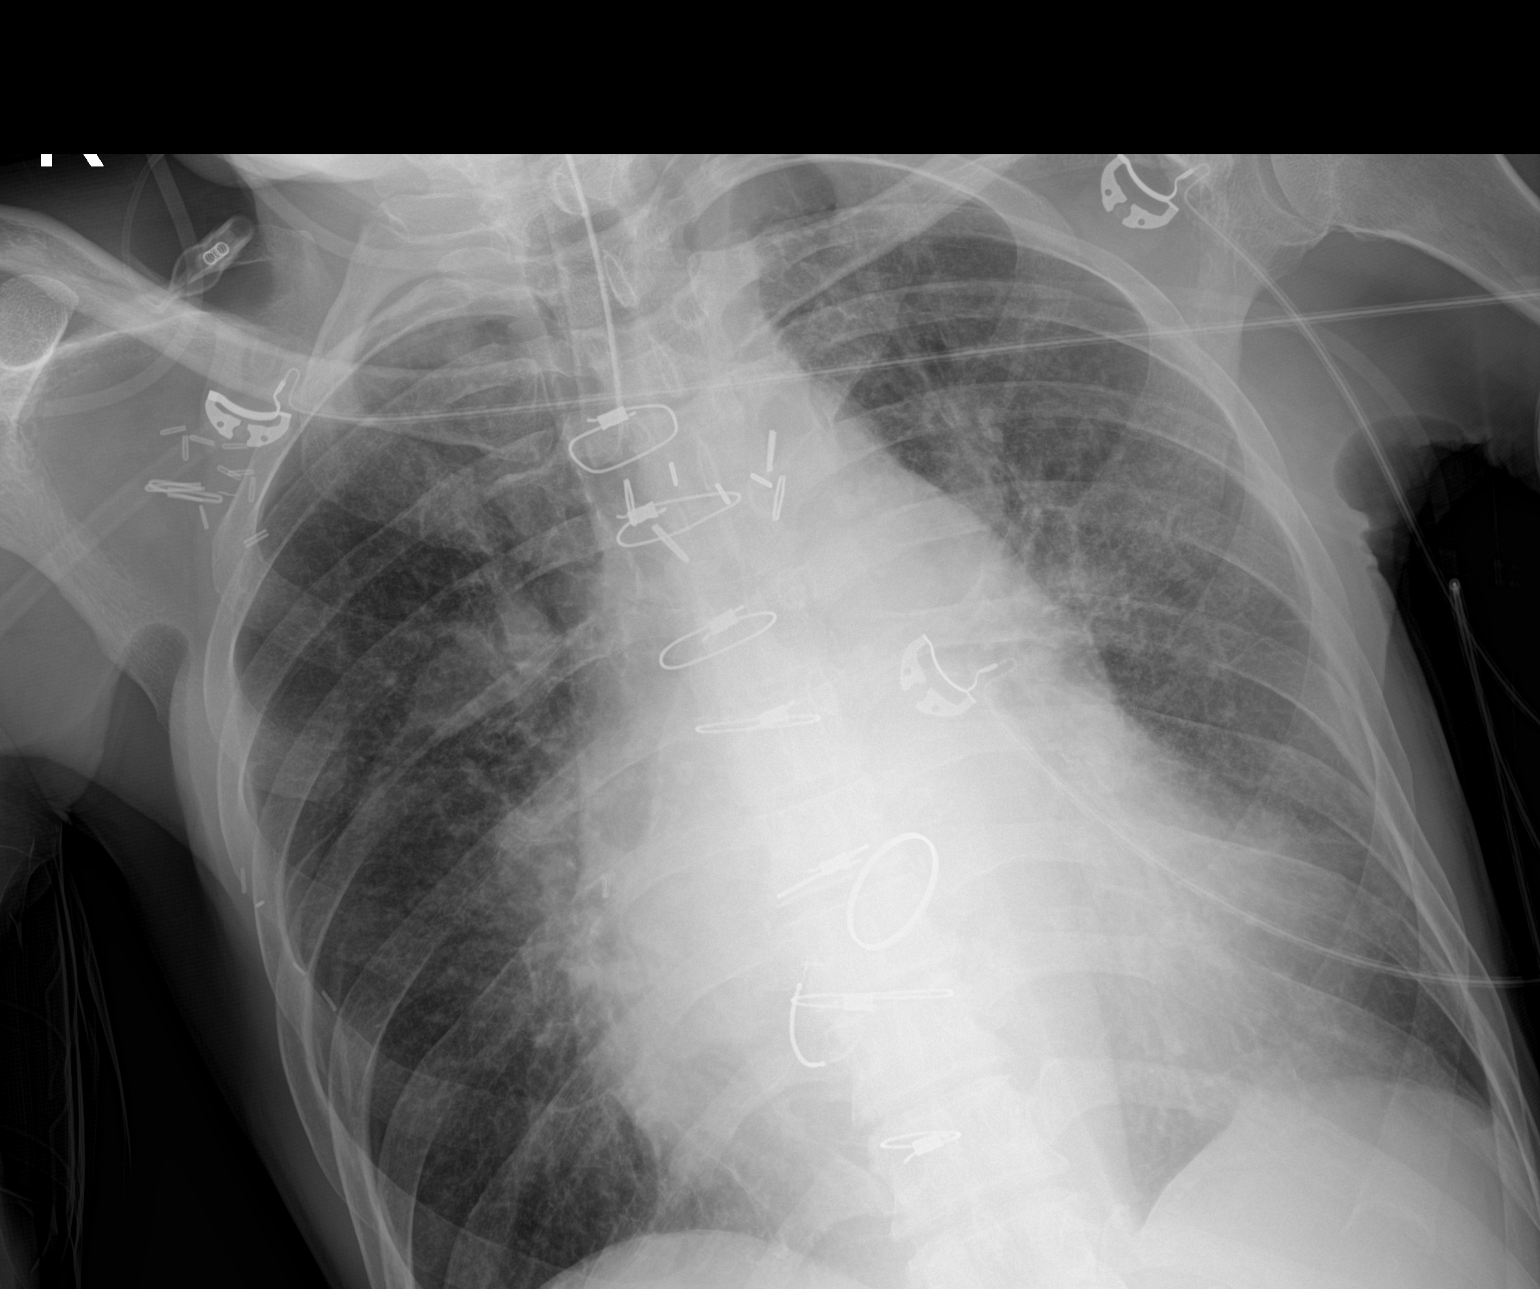

[1 of 1 positions shown; findings below may reference images not displayed]

FINDINGS: Endotracheal tube terminates 4.2 cm above the carina. Heart is
enlarged, stable. Ascending aortic aneurysm and markedly dilated
pulmonic trunk are better seen on 09/19/2018. There may be mild
central pulmonary vascular congestion. No definite pleural fluid. No
pneumothorax.
IMPRESSION: 1. Endotracheal tube is in satisfactory position.
2. Mild central pulmonary vascular congestion versus mild edema.
3. Ascending aortic aneurysm and markedly dilated pulmonic trunk,
better seen on 09/19/2018.

## 2023-02-09 IMAGING — CR DG CERVICAL SPINE 2 OR 3 VIEWS
3 series · 3 of 3 positions shown · non-contrast
Comparison: No priors.

CLINICAL DATA: 70-year-old male with history of right-sided neck
pain radiating into the right posterior shoulder for the past 2
days.

EXAM:
CERVICAL SPINE - 2-3 VIEW

[c-spine lat]
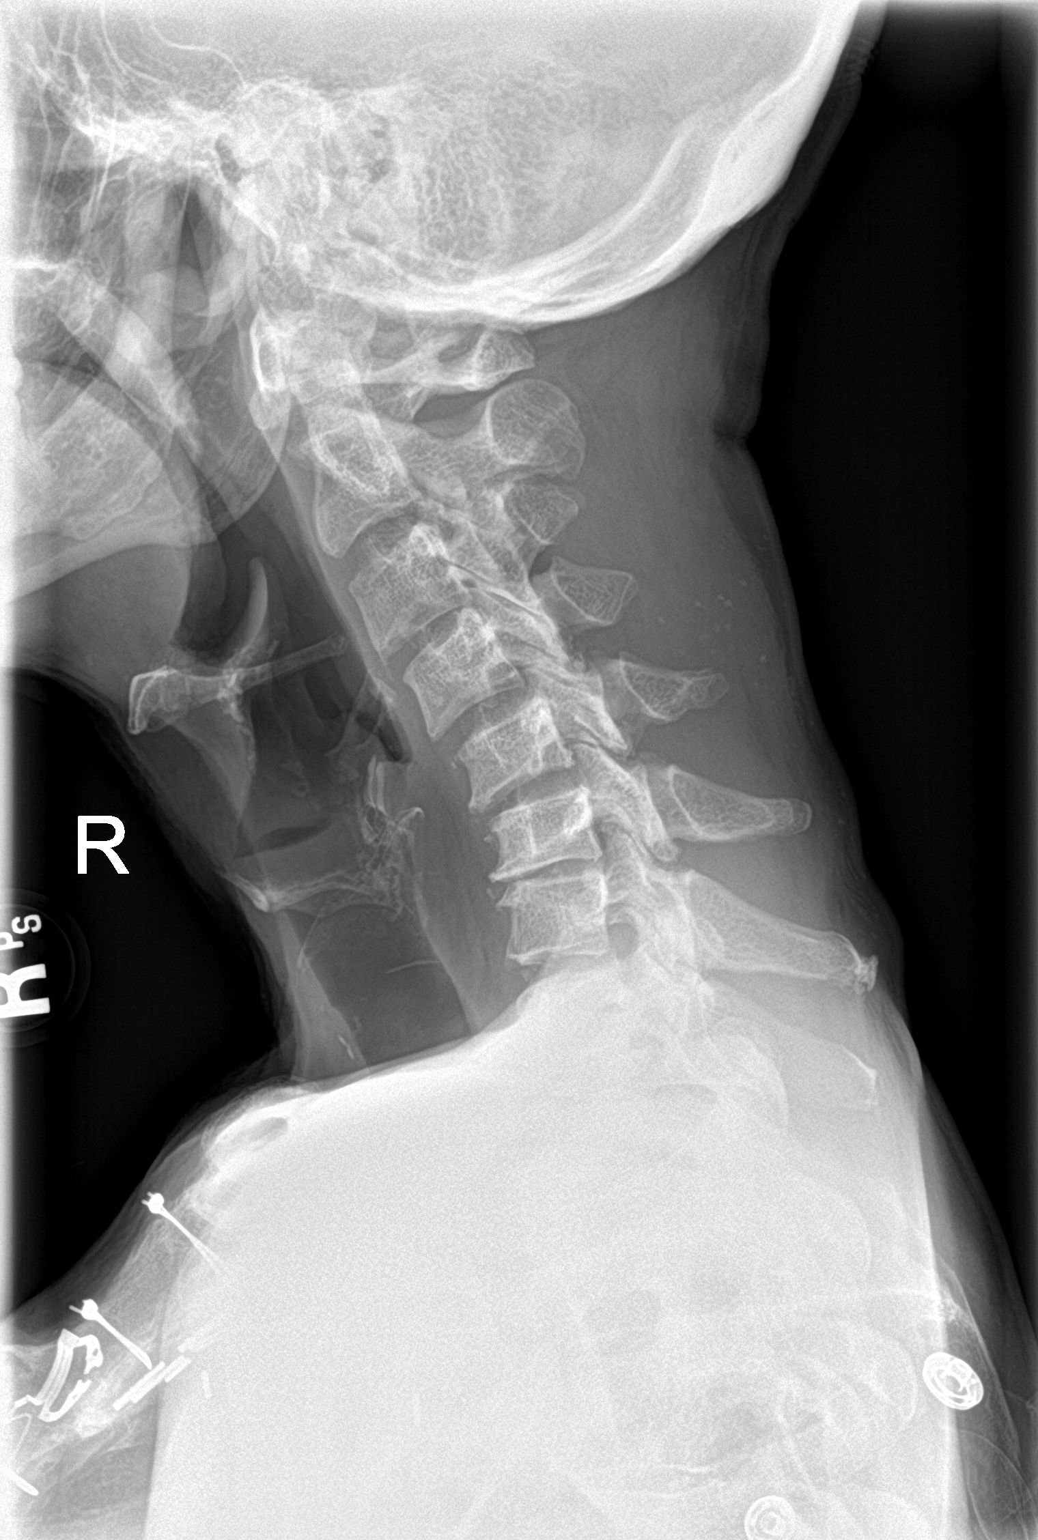

[c-spine ap]
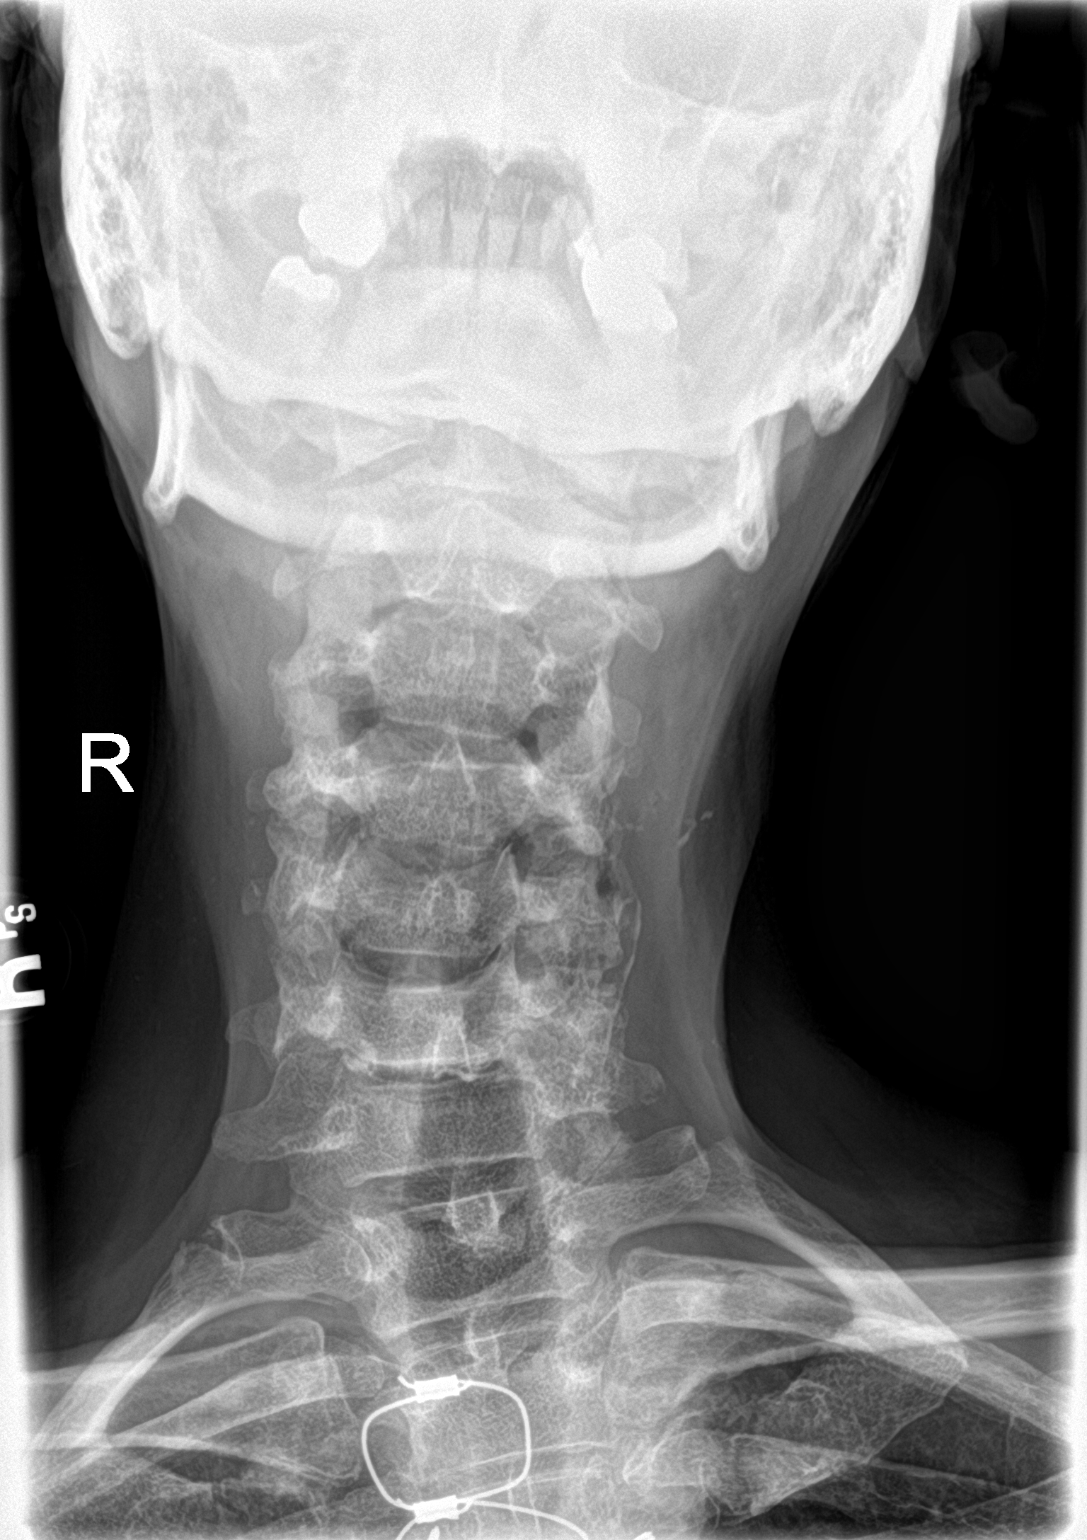

[c-spine open mouth]
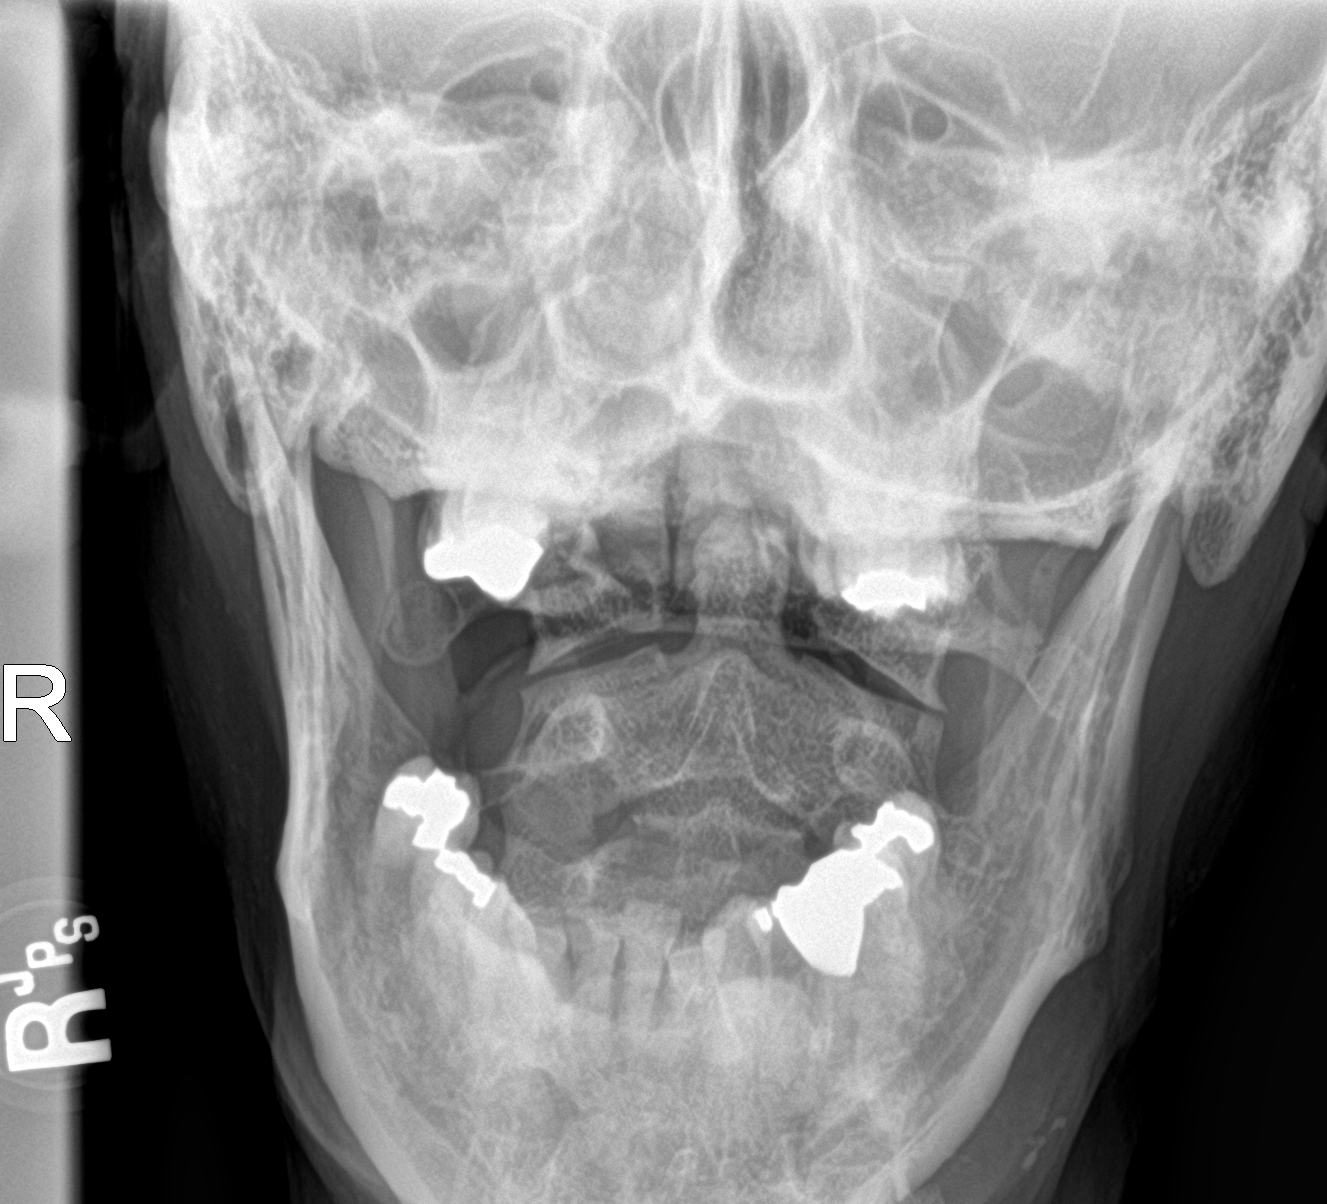

[3 of 3 positions shown; findings below may reference images not displayed]

FINDINGS: Three views of the cervical spine demonstrate no acute displaced
fracture. There is reversal of normal cervical lordosis centered at
the level of C5-C6. Alignment is otherwise anatomic. Prevertebral
soft tissues are normal. Multilevel degenerative disc disease, most
pronounced at C5-C6 and C6-C7. Moderate multilevel facet arthropathy
most severe on the left at C5-C6.
IMPRESSION: 1. Multilevel degenerative disc disease and cervical spondylosis, as
above.
2. Negative for acute fracture.
